# Patient Record
Sex: Female | Born: 1950 | Race: Black or African American | Hispanic: No | Smoking: Former smoker
Health system: Southern US, Community
[De-identification: ages and names within clinical notes are randomized; demographics above are authoritative.]

## PROBLEM LIST (undated history)

## (undated) DIAGNOSIS — D649 Anemia, unspecified: Secondary | ICD-10-CM

## (undated) DIAGNOSIS — I480 Paroxysmal atrial fibrillation: Secondary | ICD-10-CM

## (undated) DIAGNOSIS — Z87442 Personal history of urinary calculi: Secondary | ICD-10-CM

## (undated) DIAGNOSIS — I10 Essential (primary) hypertension: Secondary | ICD-10-CM

## (undated) DIAGNOSIS — R0600 Dyspnea, unspecified: Secondary | ICD-10-CM

## (undated) DIAGNOSIS — E039 Hypothyroidism, unspecified: Secondary | ICD-10-CM

## (undated) DIAGNOSIS — D6869 Other thrombophilia: Secondary | ICD-10-CM

## (undated) DIAGNOSIS — E038 Other specified hypothyroidism: Secondary | ICD-10-CM

## (undated) DIAGNOSIS — K219 Gastro-esophageal reflux disease without esophagitis: Secondary | ICD-10-CM

## (undated) DIAGNOSIS — K760 Fatty (change of) liver, not elsewhere classified: Secondary | ICD-10-CM

## (undated) DIAGNOSIS — I499 Cardiac arrhythmia, unspecified: Secondary | ICD-10-CM

## (undated) DIAGNOSIS — I4892 Unspecified atrial flutter: Secondary | ICD-10-CM

## (undated) DIAGNOSIS — Z720 Tobacco use: Secondary | ICD-10-CM

## (undated) DIAGNOSIS — I2510 Atherosclerotic heart disease of native coronary artery without angina pectoris: Secondary | ICD-10-CM

## (undated) DIAGNOSIS — I483 Typical atrial flutter: Secondary | ICD-10-CM

## (undated) DIAGNOSIS — I447 Left bundle-branch block, unspecified: Secondary | ICD-10-CM

## (undated) DIAGNOSIS — M1990 Unspecified osteoarthritis, unspecified site: Secondary | ICD-10-CM

## (undated) DIAGNOSIS — J189 Pneumonia, unspecified organism: Secondary | ICD-10-CM

## (undated) HISTORY — DX: Hypothyroidism, unspecified: E03.9

## (undated) HISTORY — DX: Cardiac arrhythmia, unspecified: I49.9

## (undated) HISTORY — DX: Unspecified atrial flutter: I48.92

## (undated) HISTORY — DX: Gastro-esophageal reflux disease without esophagitis: K21.9

## (undated) HISTORY — PX: ABDOMINAL HYSTERECTOMY: SHX81

## (undated) HISTORY — DX: Other specified hypothyroidism: E03.8

---

## 2003-09-29 ENCOUNTER — Other Ambulatory Visit: Payer: Self-pay

## 2008-06-24 ENCOUNTER — Emergency Department: Payer: Self-pay | Admitting: Emergency Medicine

## 2008-07-01 ENCOUNTER — Emergency Department: Payer: Self-pay | Admitting: Emergency Medicine

## 2010-05-01 ENCOUNTER — Emergency Department: Payer: Self-pay | Admitting: Emergency Medicine

## 2011-02-17 ENCOUNTER — Emergency Department: Payer: Self-pay | Admitting: Emergency Medicine

## 2011-02-23 ENCOUNTER — Emergency Department: Payer: Self-pay | Admitting: Emergency Medicine

## 2011-07-14 ENCOUNTER — Emergency Department: Payer: Self-pay | Admitting: *Deleted

## 2011-07-14 LAB — CBC WITH DIFFERENTIAL/PLATELET
Basophil #: 0 10*3/uL (ref 0.0–0.1)
Eosinophil %: 0.9 %
HGB: 13.5 g/dL (ref 12.0–16.0)
Lymphocyte %: 14.6 %
MCH: 23.6 pg — ABNORMAL LOW (ref 26.0–34.0)
MCHC: 31.5 g/dL — ABNORMAL LOW (ref 32.0–36.0)
Monocyte #: 0.5 x10 3/mm (ref 0.2–0.9)
Monocyte %: 6.6 %
Neutrophil %: 77.5 %
Platelet: 185 10*3/uL (ref 150–440)
RDW: 14.8 % — ABNORMAL HIGH (ref 11.5–14.5)

## 2011-07-14 LAB — COMPREHENSIVE METABOLIC PANEL
Albumin: 4.3 g/dL (ref 3.4–5.0)
Alkaline Phosphatase: 63 U/L (ref 50–136)
Anion Gap: 5 — ABNORMAL LOW (ref 7–16)
Bilirubin,Total: 0.5 mg/dL (ref 0.2–1.0)
Calcium, Total: 9 mg/dL (ref 8.5–10.1)
Chloride: 108 mmol/L — ABNORMAL HIGH (ref 98–107)
Co2: 29 mmol/L (ref 21–32)
Creatinine: 0.65 mg/dL (ref 0.60–1.30)
Osmolality: 283 (ref 275–301)
SGOT(AST): 18 U/L (ref 15–37)

## 2014-01-06 ENCOUNTER — Emergency Department: Payer: Self-pay | Admitting: Emergency Medicine

## 2014-01-06 LAB — DRUG SCREEN, URINE
Amphetamines, Ur Screen: NEGATIVE (ref ?–1000)
BENZODIAZEPINE, UR SCRN: NEGATIVE (ref ?–200)
Barbiturates, Ur Screen: NEGATIVE (ref ?–200)
COCAINE METABOLITE, UR ~~LOC~~: NEGATIVE (ref ?–300)
Cannabinoid 50 Ng, Ur ~~LOC~~: NEGATIVE (ref ?–50)
MDMA (Ecstasy)Ur Screen: NEGATIVE (ref ?–500)
METHADONE, UR SCREEN: NEGATIVE (ref ?–300)
OPIATE, UR SCREEN: NEGATIVE (ref ?–300)
Phencyclidine (PCP) Ur S: NEGATIVE (ref ?–25)
Tricyclic, Ur Screen: NEGATIVE (ref ?–1000)

## 2014-01-06 LAB — BASIC METABOLIC PANEL
Anion Gap: 7 (ref 7–16)
BUN: 10 mg/dL (ref 7–18)
CHLORIDE: 108 mmol/L — AB (ref 98–107)
CO2: 27 mmol/L (ref 21–32)
Calcium, Total: 8.5 mg/dL (ref 8.5–10.1)
Creatinine: 0.64 mg/dL (ref 0.60–1.30)
EGFR (African American): >60
Glucose: 96 mg/dL (ref 65–99)
Osmolality: 282 (ref 275–301)
POTASSIUM: 3.9 mmol/L (ref 3.5–5.1)
Sodium: 142 mmol/L (ref 136–145)

## 2014-01-06 LAB — CBC
HCT: 44.2 % (ref 35.0–47.0)
HGB: 13.8 g/dL (ref 12.0–16.0)
MCH: 23.6 pg — ABNORMAL LOW (ref 26.0–34.0)
MCHC: 31.3 g/dL — ABNORMAL LOW (ref 32.0–36.0)
MCV: 75 fL — ABNORMAL LOW (ref 80–100)
PLATELETS: 171 10*3/uL (ref 150–440)
RBC: 5.86 10*6/uL — ABNORMAL HIGH (ref 3.80–5.20)
RDW: 15.1 % — AB (ref 11.5–14.5)
WBC: 8.4 10*3/uL (ref 3.6–11.0)

## 2014-01-06 LAB — ETHANOL: Ethanol: <3 mg/dL

## 2014-01-06 LAB — TROPONIN I: Troponin-I: <0.02 ng/mL

## 2015-08-16 ENCOUNTER — Emergency Department
Admission: EM | Admit: 2015-08-16 | Discharge: 2015-08-16 | Disposition: A | Discharge status: Home / Self Care | Payer: Medicaid Other | Attending: Emergency Medicine | Admitting: Emergency Medicine

## 2015-08-16 ENCOUNTER — Encounter: Payer: Self-pay | Admitting: *Deleted

## 2015-08-16 DIAGNOSIS — H9202 Otalgia, left ear: Secondary | ICD-10-CM | POA: Diagnosis present

## 2015-08-16 DIAGNOSIS — I10 Essential (primary) hypertension: Secondary | ICD-10-CM | POA: Insufficient documentation

## 2015-08-16 DIAGNOSIS — F17200 Nicotine dependence, unspecified, uncomplicated: Secondary | ICD-10-CM | POA: Insufficient documentation

## 2015-08-16 DIAGNOSIS — K029 Dental caries, unspecified: Secondary | ICD-10-CM | POA: Insufficient documentation

## 2015-08-16 MED ORDER — AMOXICILLIN 500 MG PO CAPS
500.0000 mg | ORAL_CAPSULE | Freq: Once | ORAL | Status: AC
  Administered 2015-08-16: 500 mg via ORAL
  Filled 2015-08-16: qty 1

## 2015-08-16 MED ORDER — OXYCODONE-ACETAMINOPHEN 5-325 MG PO TABS: 1.0000 | ORAL_TABLET | Freq: Once | ORAL | Status: DC

## 2015-08-16 MED ORDER — AMOXICILLIN 500 MG PO CAPS: 500.0000 mg | ORAL_CAPSULE | Freq: Two times a day (BID) | ORAL | Status: AC

## 2015-08-16 MED ORDER — OXYCODONE-ACETAMINOPHEN 5-325 MG PO TABS
1.0000 | ORAL_TABLET | Freq: Once | ORAL | Status: AC
  Administered 2015-08-16: 1 via ORAL
  Filled 2015-08-16: qty 1

## 2015-08-16 NOTE — ED Notes (Signed)
Pt states sudden onset of L ear pain x 40 mins prior to arrival. Pt states she lay on her L ear and began to have pain. Pt took Aleve prior to arrival and is in no acute distress. Pt is hypertensive in triage, no complaints r/t HTN. Pt states no longer taking her lisinopril for HTN. Counseled pt to return to PCP and restart meds for HTN.

## 2015-08-16 NOTE — Discharge Instructions (Signed)

## 2015-08-16 NOTE — ED Provider Notes (Signed)
[facility] Emergency Department Provider Note  ____________________________________________  Time seen: 5:40 AM  I have reviewed the triage vital signs and the nursing notes.   HISTORY  Chief Complaint Otalgia     HPI [name] is a 65 y.o. female presents with left earache with onset yesterday recurrence today 40 minutes before presentation to emergency department. Patient admits to multiple dental caries with discomfort.    Past Medical History  Diagnosis Date  . Hypertension     There are no active problems to display for this patient.   Past Surgical History  Procedure Laterality Date  . Abdominal hysterectomy      Current Outpatient Rx  Name  Route  Sig  Dispense  Refill  . amoxicillin (AMOXIL) 500 MG capsule   Oral   Take 1 capsule (500 mg total) by mouth 2 (two) times daily.   20 capsule   0   . oxyCODONE-acetaminophen (PERCOCET/ROXICET) 5-325 MG tablet   Oral   Take 1 tablet by mouth once.   20 tablet   0     Allergies No known drug allergies History reviewed. No pertinent family history.  Social History Social History  Substance Use Topics  . Smoking status: Current Some Day Smoker  . Smokeless tobacco: Never Used  . Alcohol Use: Yes     Comment: occasionally    Review of Systems  Constitutional: Negative for fever. Eyes: Negative for visual changes. ENT: Negative for sore throat.Positive for left ear pain/multiple dental caries with pain Cardiovascular: Negative for chest pain. Respiratory: Negative for shortness of breath. Gastrointestinal: Negative for abdominal pain, vomiting and diarrhea. Genitourinary: Negative for dysuria. Musculoskeletal: Negative for back pain. Skin: Negative for rash. Neurological: Negative for headaches, focal weakness or numbness.   10-point ROS otherwise negative.  ____________________________________________   PHYSICAL EXAM:  VITAL SIGNS: ED Triage Vitals  Enc  Vitals Group     BP 08/16/15 0316 215/87 mmHg     Pulse Rate 08/16/15 0316 50     Resp 08/16/15 0316 18     Temp 08/16/15 0316 98 F (36.7 C)     Temp Source 08/16/15 0316 Oral     SpO2 08/16/15 0316 100 %     Weight 08/16/15 0316 155 lb (70.308 kg)     Height 08/16/15 0316  (1.651 m)     Head Cir --      Peak Flow --      Pain Score 08/16/15 0319 4     Pain Loc --      Pain Edu? --      Excl. in GC? --      Constitutional: Alert and oriented. Well appearing and in no distress. Eyes: Conjunctivae are normal. PERRL. Normal extraocular movements. ENT   Head: Normocephalic and atraumatic.   Nose: No congestion/rhinnorhea.   Mouth/Throat: Mucous membranes are moist.Multiple dental caries    Neck: No stridor. Hematological/Lymphatic/Immunilogical: Left anterior cervical lymphadenopathy. Cardiovascular: Normal rate, regular rhythm. Normal and symmetric distal pulses are present in all extremities. No murmurs, rubs, or gallops. Respiratory: Normal respiratory effort without tachypnea nor retractions. Breath sounds are clear and equal bilaterally. No wheezes/rales/rhonchi.. Genitourinary: deferred Musculoskeletal: Nontender with normal range of motion in all extremities. No joint effusions.  No lower extremity tenderness nor edema. Neurologic:  Normal speech and language. No gross focal neurologic deficits are appreciated. Speech is normal.  Skin:  Skin is warm, dry and intact. No rash noted.      INITIAL  IMPRESSION / ASSESSMENT AND PLAN / ED COURSE  Pertinent labs & imaging results that were available during my care of the patient were reviewed by me and considered in my medical decision making (see chart for details).  Patient given amoxicillin and Percocet emergency department will be prescribed same for home. Patient referred to see dentist  ____________________________________________   FINAL CLINICAL IMPRESSION(S) / ED DIAGNOSES  Final diagnoses:   Dental caries      Darci Currentandolph N Brown, MD 08/16/15 [phone]

## 2015-08-16 NOTE — ED Notes (Signed)
Pt and spouse provided with blankets for comfort.

## 2015-09-13 DIAGNOSIS — I4892 Unspecified atrial flutter: Secondary | ICD-10-CM

## 2015-09-13 HISTORY — DX: Unspecified atrial flutter: I48.92

## 2015-09-23 ENCOUNTER — Inpatient Hospital Stay (HOSPITAL_COMMUNITY)
Admit: 2015-09-23 | Discharge: 2015-09-23 | Disposition: A | Discharge status: Home / Self Care | Payer: Medicaid Other | Attending: Family Medicine | Admitting: Family Medicine

## 2015-09-23 ENCOUNTER — Inpatient Hospital Stay
Admission: EM | Admit: 2015-09-23 | Discharge: 2015-09-25 | DRG: 310 | Disposition: A | Discharge status: Home / Self Care | Payer: Medicaid Other | Attending: Internal Medicine | Admitting: Internal Medicine

## 2015-09-23 ENCOUNTER — Emergency Department: Payer: Medicaid Other

## 2015-09-23 DIAGNOSIS — I4892 Unspecified atrial flutter: Principal | ICD-10-CM

## 2015-09-23 DIAGNOSIS — I10 Essential (primary) hypertension: Secondary | ICD-10-CM | POA: Diagnosis present

## 2015-09-23 DIAGNOSIS — E876 Hypokalemia: Secondary | ICD-10-CM | POA: Diagnosis present

## 2015-09-23 DIAGNOSIS — Z79891 Long term (current) use of opiate analgesic: Secondary | ICD-10-CM

## 2015-09-23 DIAGNOSIS — Z720 Tobacco use: Secondary | ICD-10-CM

## 2015-09-23 DIAGNOSIS — F17200 Nicotine dependence, unspecified, uncomplicated: Secondary | ICD-10-CM

## 2015-09-23 DIAGNOSIS — R079 Chest pain, unspecified: Secondary | ICD-10-CM

## 2015-09-23 DIAGNOSIS — I471 Supraventricular tachycardia: Secondary | ICD-10-CM | POA: Diagnosis present

## 2015-09-23 DIAGNOSIS — Z880 Allergy status to penicillin: Secondary | ICD-10-CM | POA: Diagnosis not present

## 2015-09-23 DIAGNOSIS — E039 Hypothyroidism, unspecified: Secondary | ICD-10-CM | POA: Diagnosis present

## 2015-09-23 DIAGNOSIS — I4891 Unspecified atrial fibrillation: Secondary | ICD-10-CM | POA: Diagnosis present

## 2015-09-23 DIAGNOSIS — I483 Typical atrial flutter: Secondary | ICD-10-CM | POA: Diagnosis present

## 2015-09-23 DIAGNOSIS — I498 Other specified cardiac arrhythmias: Secondary | ICD-10-CM

## 2015-09-23 HISTORY — DX: Tobacco use: Z72.0

## 2015-09-23 HISTORY — DX: Essential (primary) hypertension: I10

## 2015-09-23 LAB — URINALYSIS COMPLETE WITH MICROSCOPIC (ARMC ONLY)
BACTERIA UA: NONE SEEN
Bilirubin Urine: NEGATIVE
Glucose, UA: NEGATIVE mg/dL
HGB URINE DIPSTICK: NEGATIVE
Ketones, ur: NEGATIVE mg/dL
NITRITE: NEGATIVE
PH: 7 (ref 5.0–8.0)
Protein, ur: NEGATIVE mg/dL
Specific Gravity, Urine: 1.003 — ABNORMAL LOW (ref 1.005–1.030)

## 2015-09-23 LAB — BASIC METABOLIC PANEL
ANION GAP: 8 (ref 5–15)
BUN: 20 mg/dL (ref 6–20)
CALCIUM: 9.6 mg/dL (ref 8.9–10.3)
CO2: 27 mmol/L (ref 22–32)
CREATININE: 0.87 mg/dL (ref 0.44–1.00)
Chloride: 105 mmol/L (ref 101–111)
Glucose, Bld: 137 mg/dL — ABNORMAL HIGH (ref 65–99)
Potassium: 3.3 mmol/L — ABNORMAL LOW (ref 3.5–5.1)
Sodium: 140 mmol/L (ref 135–145)

## 2015-09-23 LAB — CBC
HCT: 43.3 % (ref 35.0–47.0)
HEMOGLOBIN: 14.6 g/dL (ref 12.0–16.0)
MCH: 24.8 pg — AB (ref 26.0–34.0)
MCHC: 33.7 g/dL (ref 32.0–36.0)
MCV: 73.4 fL — AB (ref 80.0–100.0)
PLATELETS: 174 10*3/uL (ref 150–440)
RBC: 5.89 MIL/uL — AB (ref 3.80–5.20)
RDW: 14.7 % — ABNORMAL HIGH (ref 11.5–14.5)
WBC: 7 10*3/uL (ref 3.6–11.0)

## 2015-09-23 LAB — TROPONIN I
TROPONIN I: 0.04 ng/mL — AB (ref <0.03)
Troponin I: <0.03 ng/mL (ref <0.03)
Troponin I: 0.04 ng/mL (ref <0.03)

## 2015-09-23 LAB — T4, FREE: Free T4: 0.7 ng/dL (ref 0.61–1.12)

## 2015-09-23 LAB — HEPARIN LEVEL (UNFRACTIONATED): HEPARIN UNFRACTIONATED: 0.79 [IU]/mL — AB (ref 0.30–0.70)

## 2015-09-23 LAB — TSH: TSH: 21.96 u[IU]/mL — ABNORMAL HIGH (ref 0.350–4.500)

## 2015-09-23 LAB — MAGNESIUM: Magnesium: 2 mg/dL (ref 1.7–2.4)

## 2015-09-23 LAB — APTT: aPTT: 31 seconds (ref 24–36)

## 2015-09-23 MED ORDER — DILTIAZEM HCL 25 MG/5ML IV SOLN
10.0000 mg | Freq: Once | INTRAVENOUS | Status: AC
  Administered 2015-09-23: 10 mg via INTRAVENOUS
  Filled 2015-09-23: qty 5

## 2015-09-23 MED ORDER — ONDANSETRON HCL 4 MG/2ML IJ SOLN: 4.0000 mg | Freq: Four times a day (QID) | INTRAMUSCULAR | Status: DC | PRN

## 2015-09-23 MED ORDER — SODIUM CHLORIDE 0.9% FLUSH
3.0000 mL | Freq: Two times a day (BID) | INTRAVENOUS | Status: DC
  Administered 2015-09-23: 3 mL via INTRAVENOUS

## 2015-09-23 MED ORDER — POTASSIUM CHLORIDE CRYS ER 20 MEQ PO TBCR
30.0000 meq | EXTENDED_RELEASE_TABLET | Freq: Two times a day (BID) | ORAL | Status: AC
  Administered 2015-09-23 – 2015-09-24 (×2): 30 meq via ORAL
  Filled 2015-09-23 (×2): qty 1

## 2015-09-23 MED ORDER — AMLODIPINE BESYLATE 5 MG PO TABS
5.0000 mg | ORAL_TABLET | Freq: Every day | ORAL | Status: DC
  Administered 2015-09-23 – 2015-09-24 (×2): 5 mg via ORAL
  Filled 2015-09-23 (×2): qty 1

## 2015-09-23 MED ORDER — METOPROLOL TARTRATE 50 MG PO TABS
50.0000 mg | ORAL_TABLET | Freq: Two times a day (BID) | ORAL | Status: DC
  Administered 2015-09-23 – 2015-09-25 (×5): 50 mg via ORAL
  Filled 2015-09-23 (×5): qty 1

## 2015-09-23 MED ORDER — NICOTINE 14 MG/24HR TD PT24
14.0000 mg | MEDICATED_PATCH | Freq: Every day | TRANSDERMAL | Status: DC
  Filled 2015-09-23 (×2): qty 1

## 2015-09-23 MED ORDER — ACETAMINOPHEN 325 MG PO TABS
650.0000 mg | ORAL_TABLET | Freq: Four times a day (QID) | ORAL | Status: DC | PRN
  Administered 2015-09-24 – 2015-09-25 (×2): 650 mg via ORAL
  Filled 2015-09-23 (×2): qty 2

## 2015-09-23 MED ORDER — NITROGLYCERIN 2 % TD OINT: 1.0000 [in_us] | TOPICAL_OINTMENT | Freq: Once | TRANSDERMAL | Status: DC

## 2015-09-23 MED ORDER — DILTIAZEM HCL 30 MG PO TABS
60.0000 mg | ORAL_TABLET | Freq: Once | ORAL | Status: AC
  Administered 2015-09-23: 60 mg via ORAL
  Filled 2015-09-23: qty 2

## 2015-09-23 MED ORDER — POTASSIUM CHLORIDE CRYS ER 20 MEQ PO TBCR: 20.0000 meq | EXTENDED_RELEASE_TABLET | Freq: Once | ORAL | Status: DC

## 2015-09-23 MED ORDER — ONDANSETRON HCL 4 MG PO TABS: 4.0000 mg | ORAL_TABLET | Freq: Four times a day (QID) | ORAL | Status: DC | PRN

## 2015-09-23 MED ORDER — SENNOSIDES-DOCUSATE SODIUM 8.6-50 MG PO TABS: 1.0000 | ORAL_TABLET | Freq: Every evening | ORAL | Status: DC | PRN

## 2015-09-23 MED ORDER — METOPROLOL TARTRATE 5 MG/5ML IV SOLN
5.0000 mg | INTRAVENOUS | Status: DC | PRN
Start: 2015-09-23 — End: 2015-09-25

## 2015-09-23 MED ORDER — SODIUM CHLORIDE 0.9 % IV BOLUS (SEPSIS)
1000.0000 mL | Freq: Once | INTRAVENOUS | Status: AC
  Administered 2015-09-23: 1000 mL via INTRAVENOUS

## 2015-09-23 MED ORDER — APIXABAN 5 MG PO TABS
5.0000 mg | ORAL_TABLET | Freq: Two times a day (BID) | ORAL | Status: DC
  Administered 2015-09-23: 5 mg via ORAL
  Filled 2015-09-23: qty 1

## 2015-09-23 MED ORDER — GI COCKTAIL ~~LOC~~
30.0000 mL | Freq: Once | ORAL | Status: DC
  Filled 2015-09-23 (×2): qty 30

## 2015-09-23 MED ORDER — ACETAMINOPHEN 650 MG RE SUPP: 650.0000 mg | Freq: Four times a day (QID) | RECTAL | Status: DC | PRN

## 2015-09-23 MED ORDER — HEPARIN (PORCINE) IN NACL 100-0.45 UNIT/ML-% IJ SOLN
12.0000 [IU]/kg/h | INTRAMUSCULAR | Status: DC
  Administered 2015-09-23 (×2): 12 [IU]/kg/h via INTRAVENOUS
  Filled 2015-09-23 (×2): qty 250

## 2015-09-23 NOTE — ED Provider Notes (Signed)
[facility] Emergency Department Provider Note   ____________________________________________  Time seen: Approximately 3:00 AM  I have reviewed the triage vital signs and the nursing notes.   HISTORY  Chief Complaint Chest Pain    HPI [name] is a 65 y.o. female who comes into the hospital today with some chest pain.The patient laid down for the night and reports that she started having burning in her chest. She reports that she felt the burning was coming up her chest and going into her throat. The patient reports that she got up to use the bathroom and the pain improved but then it came back not as bad as initially. She reports it was not sharp pain. She reports that she could feel it across her chest. She had some jumping of her left breast and her heart was beating fast. The patient got scared so she called 911. The patient reports that she became nauseous and started feeling sweaty. She felt a little lightheaded but no shortness of breath. The pain goes from the right side of her chest all the way across the left. Initially the pain was a 2-3 out of 10 in intensity but she reports it it's a 0 currently. The patient has never had this pain before so she decided to come and get checked out.   Past Medical History  Diagnosis Date  . Hypertension     Patient Active Problem List   Diagnosis Date Noted  . SVT (supraventricular tachycardia) (HCC) 09/23/2015    Past Surgical History  Procedure Laterality Date  . Abdominal hysterectomy      Current Outpatient Rx  Name  Route  Sig  Dispense  Refill  . oxyCODONE-acetaminophen (PERCOCET/ROXICET) 5-325 MG tablet   Oral   Take 1 tablet by mouth once.   20 tablet   0     Allergies Amoxicillin  No family history on file.  Social History Social History  Substance Use Topics  . Smoking status: Current Some Day Smoker  . Smokeless tobacco: Never Used  . Alcohol Use: Yes     Comment:  occasionally    Review of Systems Constitutional: Sweats No fever/chills Eyes: No visual changes. ENT: No sore throat. Cardiovascular: chest pain. Respiratory: Denies shortness of breath. Gastrointestinal:  nausea, no vomiting.  No diarrhea.  No constipation. Genitourinary: Negative for dysuria. Musculoskeletal: Negative for back pain. Skin: Negative for rash. Neurological: Lightheadedness  10-point ROS otherwise negative.  ____________________________________________   PHYSICAL EXAM:  VITAL SIGNS: ED Triage Vitals  Enc Vitals Group     BP 09/23/15   0300 116/95     Pulse 09/23/15   0300 105     Resp 09/23/15   0300 15     Temp 09/23/15   0300 97.7     Temp src --      SpO2 09/23/15   0300 100%     Weight 09/23/15   0300 156lb     Height --      Head Cir --      Peak Flow --      Pain Score --      Pain Loc --      Pain Edu? --      Excl. in GC? --     Constitutional: Alert and oriented. Well appearing and in Mild distress. Eyes: Conjunctivae are normal. PERRL. EOMI. Head: Atraumatic. Nose: No congestion/rhinnorhea. Mouth/Throat: Mucous membranes are moist.  Oropharynx non-erythematous. Cardiovascular: Tachycardia, regular rhythm. Grossly normal  heart sounds.  Good peripheral circulation. Respiratory: Normal respiratory effort.  No retractions. Lungs CTAB. Gastrointestinal: Soft and nontender. No distention. Positive bowel sounds Musculoskeletal: No lower extremity tenderness nor edema.   Neurologic:  Normal speech and language.  Skin:  Skin is warm, dry and intact.  Psychiatric: Mood and affect are normal. .  ____________________________________________   LABS (all labs ordered are listed, but only abnormal results are displayed)  Labs Reviewed  BASIC METABOLIC PANEL - Abnormal; Notable for the following:    Potassium 3.3 (*)    Glucose, Bld 137 (*)    All other components within normal limits  CBC - Abnormal; Notable for the following:    RBC 5.89  (*)    MCV 73.4 (*)    MCH 24.8 (*)    RDW 14.7 (*)    All other components within normal limits  URINALYSIS COMPLETEWITH MICROSCOPIC ([facility] ONLY) - Abnormal; Notable for the following:    Color, Urine COLORLESS (*)    APPearance CLEAR (*)    Specific Gravity, Urine 1.003 (*)    Leukocytes, UA TRACE (*)    Squamous Epithelial / LPF 0-5 (*)    All other components within normal limits  TROPONIN I  MAGNESIUM   ____________________________________________  EKG  ED ECG REPORT I, Rebecka ApleyWebster,  Askari P, the attending physician, personally viewed and interpreted this ECG.   Date: 09/23/2015  EKG Time: 258  Rate: 105  Rhythm: sinus tachycardia  Axis: Normal  Intervals:none  ST&T Change: ST depressions in leads II, III, aVF, V4, V5, V6  ED ECG REPORT #2 I, Rebecka ApleyWebster,  Mincey P, the attending physician, personally viewed and interpreted this ECG.   Date: 09/23/2015  EKG Time: 421  Rate: 110  Rhythm: sinus tachycardia  Axis: normal  Intervals:none  ST&T Change: ST depressions in leads 2, 3, aVF, V4, V5, V6 approximately a millimeter of elevation in V1 and V2  ED ECG REPORT #3 I, Rebecka ApleyWebster,  Oo P, the attending physician, personally viewed and interpreted this ECG.   Date: 09/23/2015  EKG Time: 438  Rate: 191  Rhythm: SVT  Axis: normal  Intervals:none  ST&T Change: Flipped T waves in leads 2, 3, aVF, V4, V5, V6  ED ECG REPORT #4 I, Rebecka ApleyWebster,  Angelos P, the attending physician, personally viewed and interpreted this ECG.   Date: 09/23/2015  EKG Time: 542  Rate: 152  Rhythm: atrial fibrillation at 152  Axis: normal  Intervals:none  ST&T Change: ST segment depression in leads 2, 3, aVF, flipped T waves in leads V4, V5, V6 and some ST segment elevation in aVR does not meet STEMI criteria  ____________________________________________  RADIOLOGY  CXR: Chest x-ray: No active cardiopulmonary  disease. ____________________________________________   PROCEDURES  Procedure(s) performed: None  Procedures  Critical Care performed: Yes, see critical care note(s)   CRITICAL CARE Performed by: Lucrezia EuropeWebster,  Buske P   Total critical care time: 45 minutes  Critical care time was exclusive of separately billable procedures and treating other patients.  Critical care was necessary to treat or prevent imminent or life-threatening deterioration.  Critical care was time spent personally by me on the following activities: development of treatment plan with patient and/or surrogate as well as nursing, discussions with consultants, evaluation of patient's response to treatment, examination of patient, obtaining history from patient or surrogate, ordering and performing treatments and interventions, ordering and review of laboratory studies, ordering and review of radiographic studies, pulse oximetry and re-evaluation of patient's condition.  ____________________________________________   INITIAL IMPRESSION / ASSESSMENT AND PLAN / ED COURSE  Pertinent labs & imaging results that were available during my care of the patient were reviewed by me and considered in my medical decision making (see chart for details).  This is a 65 year old female who comes into the hospital today with some chest pain. She reports it was burning into her chest and she was having some sweats and some nausea. The patient's heart was also beating very fast. I will give the patient liter of normal saline as well as a GI cocktail. She does have some ST depressions on her EKG. I will check some blood work and reassess the patient.  The patient while she was waiting did develop chest pain again. When the pain started the patient's heart rate went up to 209. We attempted to capture it on EKG but was unable to when the patient's tachycardia improved. It occurred subsequent time with the patient's heart rate at 109. It  appeared as though the patient was in SVT but she was able to break with vagal maneuvers. The patient had 2 more episodes of tachycardia and worsening chest pain. At that time it was decided to give the patient some diltiazem 60 mg orally as well as 10 mg IV. Given the patient's paroxysmal tachycardia which has been SVT as well as atrial fibrillation she will be admitted to the hospitalist service. The patient's chest pain is resolved at this time. ____________________________________________   FINAL CLINICAL IMPRESSION(S) / ED DIAGNOSES  Final diagnoses:  Other cardiac arrhythmia  Chest pain, unspecified chest pain type      NEW MEDICATIONS STARTED DURING THIS VISIT:  New Prescriptions   No medications on file     Note:  This document was prepared using Dragon voice recognition software and may include unintentional dictation errors.    Rebecka Apley, MD 09/23/15 [phone]

## 2015-09-23 NOTE — ED Notes (Addendum)
Pt arrives to ED from home via [facility] for c/o epigastric chest pain/burning with radiation into bilateral sides of neck with nausea, dizziness, and lightheadedness. Pt reports the pain as "burning" and "feeling like my heart was beating hard and fast". Pt denies any previous history of cardiac issues; denies history of gastric reflux.

## 2015-09-23 NOTE — Consult Note (Signed)
Cardiology Consultation Note  Patient ID: [name], MRN: [id number], DOB/AGE: 08-28-1950 65 y.o. Admit date: 09/23/2015   Date of Consult: 09/23/2015 Primary Physician: No primary care provider on file. Primary Cardiologist: New to [facility] Requesting Physician: Dr. Sherryll Burger, MD  Chief Complaint: GERD Reason for Consult: Afib with RVR/elevated   HPI: 65 y.o. female with h/o HTN, long standing ongoing tobacco abuse, and history of self reported hyperthyroidism not on medication presented to [facility] on 7/11 with heart burn and palpitations. She was found to be in new onset Afib/flutter with RVR with heart rates in the 150's bpm. Cardiology is consulted for further evaluation.  She has never seen a cardiologist before and has no prior echo, stress test, or cardiac cath. She self reports a history of hyperthyroidism in 1996 but has not been on medication. She does not see PCP frequently. She has a long standing tobacco abuse history, smoking 1-2 PPD since her teenage years and continues to smoke to date. She rarely drinks alcohol and denies any illegal drug abuse.   She was in her usual state of health on 7/10 and laid down to sleep at 1:30 AM. Shortly after falling asleep she was woken up with a heart burn sensation. She reports a mild history of heart burn, bu "not like this." She then noticed palpitations. She went to the restroom and reports she could see her left breast moving with her rapid heart beat. She advised her "old man" to call EMS for transport to the hospital. There was associated nausea without emesis, SOB, and diaphoresis. No dizziness, presyncope, or syncope.   Upon the patient's arrival to [facility] they were found to have troponin <0.03-->0.04, TSH 21.960, K+ 3.3, Mg++ 2.0. ECG x 2 as below, CXR showed no active cardiopulmonary disease. She is currently asymptomatic, in rate-controlled Afib/flutter with heart rates in the 70's bpm. She has received one dose of Eliquis this morning per  IM.   Past Medical History  Diagnosis Date  . Essential hypertension   . Tobacco abuse   . Abnormal thyroid blood test       Most Recent Cardiac Studies: none   Surgical History:  Past Surgical History  Procedure Laterality Date  . Abdominal hysterectomy       Home Meds: Prior to Admission medications   Medication Sig Start Date End Date Taking? Authorizing Provider  oxyCODONE-acetaminophen (PERCOCET/ROXICET) 5-325 MG tablet Take 1 tablet by mouth once. Patient taking differently: Take 1 tablet by mouth daily as needed.  08/16/15  Yes Darci Current, MD    Inpatient Medications:  . apixaban  5 mg Oral BID  . metoprolol tartrate  50 mg Oral BID  . nicotine  14 mg Transdermal Daily  . nitroGLYCERIN  1 inch Topical Once  . potassium chloride  30 mEq Oral BID  . sodium chloride flush  3 mL Intravenous Q12H      Allergies:  Allergies  Allergen Reactions  . Amoxicillin Itching    Social History   Social History  . Marital Status: Widowed    Spouse Name: N/A  . Number of Children: N/A  . Years of Education: N/A   Occupational History  . Not on file.   Social History Main Topics  . Smoking status: Current Some Day Smoker  . Smokeless tobacco: Never Used  . Alcohol Use: Yes     Comment: occasionally  . Drug Use: No  . Sexual Activity: Yes    Birth Control/ Protection: Surgical  Other Topics Concern  . Not on file   Social History Narrative     Family History  Problem Relation Age of Onset  . Hypertension Mother   . Diabetes Mother   . Diabetes Father      Review of Systems: Review of Systems  Constitutional: Positive for weight loss and malaise/fatigue. Negative for fever, chills and diaphoresis.  HENT: Negative for congestion.   Eyes: Negative for discharge and redness.  Respiratory: Positive for shortness of breath. Negative for cough, sputum production and wheezing.   Cardiovascular: Positive for palpitations. Negative for chest pain,  orthopnea, claudication, leg swelling and PND.  Gastrointestinal: Positive for heartburn and nausea. Negative for vomiting, abdominal pain, blood in stool and melena.  Genitourinary: Negative for hematuria.  Musculoskeletal: Negative for falls.  Skin: Negative for rash.  Neurological: Positive for weakness. Negative for dizziness, sensory change, speech change, focal weakness and loss of consciousness.  Endo/Heme/Allergies: Does not bruise/bleed easily.  Psychiatric/Behavioral: The patient is not nervous/anxious.   All other systems reviewed and are negative.   Labs:  Recent Labs  09/23/15 0313 09/23/15 1012  TROPONINI <0.03 0.04*   Lab Results  Component Value Date   WBC 7.0 09/23/2015   HGB 14.6 09/23/2015   HCT 43.3 09/23/2015   MCV 73.4* 09/23/2015   PLT 174 09/23/2015     Recent Labs Lab 09/23/15 0313  NA 140  K 3.3*  CL 105  CO2 27  BUN 20  CREATININE 0.87  CALCIUM 9.6  GLUCOSE 137*   No results found for: CHOL, HDL, LDLCALC, TRIG No results found for: DDIMER  Radiology/Studies:  Dg Chest 2 View  09/23/2015  CLINICAL DATA:  Epigastric chest pain and burning with radiation to both sides of the neck. Nausea, dizziness, lightheadedness. Smoker. EXAM: CHEST  2 VIEW COMPARISON:  01/06/2014 FINDINGS: Mild hyperinflation consistent with emphysema. Normal heart size and pulmonary vascularity. No focal airspace disease or consolidation in the lungs. No blunting of costophrenic angles. No pneumothorax. Mediastinal contours appear intact. Calcified and tortuous aorta. Degenerative changes in the spine. IMPRESSION: No active cardiopulmonary disease. Electronically Signed   By: Burman NievesWilliam  Stevens M.D.   On: 09/23/2015 03:38    EKG: Interpreted by me showed: 7/11 at 5:42 AM - Afib with RVR, 152 bpm, LVH, diffuse TWI with horiazontal st depression in V4-V5. 7/11 at 2:58 AM - Afib with RVR, 105 bpm, diffuse TWI Telemetry: Interpreted by me showed: Afib/flutter, 70's  bpm  Weights: Filed Weights   09/23/15 0300 09/23/15 0754  Weight: 156 lb (70.761 kg) 154 lb 14.4 oz (70.262 kg)     Physical Exam: Blood pressure 145/85, pulse 54, temperature 97.6 F (36.4 C), temperature source Oral, resp. rate 22, height 5\' 5"  (1.651 m), weight 154 lb 14.4 oz (70.262 kg), SpO2 100 %. Body mass index is 25.78 kg/(m^2). General: Well developed, well nourished, in no acute distress. Head: Normocephalic, atraumatic, sclera non-icteric, no xanthomas, nares are without discharge. Poor dentition.   Neck: Negative for carotid bruits. JVD not elevated. Lungs: Clear bilaterally to auscultation without wheezes, rales, or rhonchi. Breathing is unlabored. Heart: Irregularly-irregular with S1 S2. No murmurs, rubs, or gallops appreciated. Abdomen: Soft, non-tender, non-distended with normoactive bowel sounds. No hepatomegaly. No rebound/guarding. No obvious abdominal masses. Msk:  Strength and tone appear normal for age. Extremities: No clubbing or cyanosis. No edema. Distal pedal pulses are 2+ and equal bilaterally. Neuro: Alert and oriented X 3. No facial asymmetry. No focal deficit. Moves all extremities spontaneously. Psych:  Responds to questions appropriately with a normal affect.    Assessment and Plan:  Principal Problem:   Atrial fibrillation with RVR (HCC) Active Problems:   Abnormal thyroid blood test   Hypokalemia   Essential hypertension   Tobacco abuse    1. New onset Afib/flutter with RVR: -Symptoms began shortly after 1:30 AM -Very symptomatic with tachycardic heart rate, though not so with rate control -Cannot guarantee she has not been in this rhythm for longer than overnight given her asymptomatic nature with adequate rate control -Her arrhythmia is likely in the setting of her severely abnormal thyroid function study with a TSH > 21 and her hypokalemia at 3.3 upon admission -Pursue rate control at this time with metoprolol -She was started on Eliquis  per IM this morning. There is concern for possible ischemia in the setting of her elevated troponin at 0.04 and abnormal st/t changes on EKG. Those these findings may also be in the setting of her tachycardic heart rate -Stop Eliquis and place on heparin gtt for possible cardiac cath this admission given the below -CHADS2VASc at least 2 (HTN, sex category) -Check echo once heart rate is controlled (this has been done already, uncertain at this time if completely with rate-controlled Afib)  2. Elevated troponin: -<0.03-->0.04 -Continue to cycle as above -Possibly in the setting of her tachycardic heart rate vs ACS given her symptoms of a different type of reflux than she has had before -Start heparin gtt in place of Eliquis as above -On metoprolol as above  3. Severely abnormal thyroid function: -Patient self reports a history of hyperthyroidism -I have added on thyroid function studies to evaluate for hypo vs hyperthyroidism -Defer treatment of this abnormality to IM  4. Hypokalemia: -Added KCl 30 meq bid x 2 doses -Recheck bmet in the morning  -Replete to 4.0  5. Essential hypertension: -On metoprolol as above -Add Norvasc 5 mg daily (if needed at a later date can change to diltiazem for added rate control, though this is not currently needed) -Monitor   6. Longstanding tobacco abuse: -Cessation advised    Signed, Eula Listen, PA-C [facility] Pager: [phone] 09/23/2015, 4:01 PM

## 2015-09-23 NOTE — Progress Notes (Signed)
Heparin gtt to start at 2100 per Pharm/ spoke with pharm on via phone/ told to stop gtt until 2100/ gtt stopped/ will communicate with night RN to restart at 2100 as requested.

## 2015-09-23 NOTE — ED Notes (Addendum)
After ambulating to the BR to void, the pt reports the burning sensation in her chest had returned and agreed to take the GI cocktail that Dr Zenda AlpersWebster had originally ordered. After taking one sip of the medication, the pt immediately began to vomit and become tachycardiac with HR reaching 200 BPMs. Pt instructed to attempt vagal maneuver which successfully reduced the pt's HR to the 100-110 BPM range.Zenda AlpersWebster, MD made aware.

## 2015-09-23 NOTE — ED Notes (Signed)
Patient transported to X-ray 

## 2015-09-23 NOTE — Progress Notes (Signed)
[employer] - [place] at [facility]   PATIENT NAME: [name]    MR#:  [id number]  DATE OF BIRTH:  02/01/1951  SUBJECTIVE:  CHIEF COMPLAINT:   Chief Complaint  Patient presents with  . Chest Pain  still feels fluttering spell, some SOB, family at bedside.  REVIEW OF SYSTEMS:  Review of Systems  Constitutional: Negative for fever, weight loss, malaise/fatigue and diaphoresis.  HENT: Negative for ear discharge, ear pain, hearing loss, nosebleeds, sore throat and tinnitus.   Eyes: Negative for blurred vision and pain.  Respiratory: Positive for shortness of breath. Negative for cough, hemoptysis and wheezing.   Cardiovascular: Positive for palpitations. Negative for chest pain, orthopnea and leg swelling.  Gastrointestinal: Negative for heartburn, nausea, vomiting, abdominal pain, diarrhea, constipation and blood in stool.  Genitourinary: Negative for dysuria, urgency and frequency.  Musculoskeletal: Negative for myalgias and back pain.  Skin: Negative for itching and rash.  Neurological: Negative for dizziness, tingling, tremors, focal weakness, seizures, weakness and headaches.  Psychiatric/Behavioral: Negative for depression. The patient is not nervous/anxious.    DRUG ALLERGIES:   Allergies  Allergen Reactions  . Amoxicillin Itching   VITALS:  Blood pressure 145/85, pulse 54, temperature 97.6 F (36.4 C), temperature source Oral, resp. rate 22, height 5\' 5"  (1.651 m), weight 70.262 kg (154 lb 14.4 oz), SpO2 100 %. PHYSICAL EXAMINATION:  Physical Exam  Constitutional: She is oriented to person, place, and time and well-developed, well-nourished, and in no distress.  HENT:  Head: Normocephalic and atraumatic.  Eyes: Conjunctivae and EOM are normal. Pupils are equal, round, and reactive to light.  Neck: Normal range of motion. Neck supple. No tracheal deviation present. No thyromegaly present.  Cardiovascular: Normal rate and normal heart sounds.  An  irregularly irregular rhythm present.  Pulmonary/Chest: Effort normal and breath sounds normal. No respiratory distress. She has no wheezes. She exhibits no tenderness.  Abdominal: Soft. Bowel sounds are normal. She exhibits no distension. There is no tenderness.  Musculoskeletal: Normal range of motion.  Neurological: She is alert and oriented to person, place, and time. No cranial nerve deficit.  Skin: Skin is warm and dry. No rash noted.  Psychiatric: Mood and affect normal.   LABORATORY PANEL:   CBC  Recent Labs Lab 09/23/15 0313  WBC 7.0  HGB 14.6  HCT 43.3  PLT 174   ------------------------------------------------------------------------------------------------------------------ Chemistries   Recent Labs Lab 09/23/15 0313  NA 140  K 3.3*  CL 105  CO2 27  GLUCOSE 137*  BUN 20  CREATININE 0.87  CALCIUM 9.6  MG 2.0   RADIOLOGY:  Dg Chest 2 View  09/23/2015  CLINICAL DATA:  Epigastric chest pain and burning with radiation to both sides of the neck. Nausea, dizziness, lightheadedness. Smoker. EXAM: CHEST  2 VIEW COMPARISON:  01/06/2014 FINDINGS: Mild hyperinflation consistent with emphysema. Normal heart size and pulmonary vascularity. No focal airspace disease or consolidation in the lungs. No blunting of costophrenic angles. No pneumothorax. Mediastinal contours appear intact. Calcified and tortuous aorta. Degenerative changes in the spine. IMPRESSION: No active cardiopulmonary disease. Electronically Signed   By: Burman NievesWilliam  Stevens M.D.   On: 09/23/2015 03:38   ASSESSMENT AND PLAN:  . SVT (supraventricular tachycardia) (HCC) /atrial fib fibrillation - Monitor on telemetry -TSH high, check Free T3,T4, pending 2-D echo - HR under control by mouth metoprolol 50 mg by mouth twice a day, also IV Lopressor as needed for heart or greater than 110 -Eliquis pharmacy to dose. The patient heart  rate goes between A. fib, SVT and sinus tach. -cardio c/s  Hypertension -  metoprolol  Hypokalemia -Replete and recheck  Tobacco abuse -Nicotine patch     All the records are reviewed and case discussed with Care Management/Social Worker. Management plans discussed with the patient, family and they are in agreement.  CODE STATUS: FULL CODE  TOTAL TIME TAKING CARE OF THIS PATIENT: 35 minutes.   More than 50% of the time was spent in counseling/coordination of care: YES  POSSIBLE D/C IN 1-2 DAYS, DEPENDING ON CLINICAL CONDITION. And cardio eval   [facility], Cambrea Kirt M.D on 09/23/2015 at 3:59 PM  Between 7am to 6pm - Pager - [phone]  After 6pm go to [email or web address] - Scientist, research (life sciences) [facility]  Office  [phone]  CC: Primary care physician; No primary care provider on file.  Note: This dictation was prepared with Dragon dictation along with smaller phrase technology. Any transcriptional errors that result from this process are unintentional.

## 2015-09-23 NOTE — ED Notes (Addendum)
Pt assisted to void and became tachycardic again for the 2nd time with reported "burning" sensation in her chest. Pts HR noted to be up to 190 BPM and then return to 100-110 BPM following vagal maneuver. Zenda AlpersWebster, MD made aware.

## 2015-09-23 NOTE — ED Notes (Signed)
Lab called and notified of add-on Magnesium level; lab tech verbalized acknowledgement.

## 2015-09-23 NOTE — Progress Notes (Signed)
*  PRELIMINARY RESULTS* Echocardiogram 2D Echocardiogram has been performed.  Cristela BlueHege, Kyriana Yankee 09/23/2015, 3:10 PM

## 2015-09-23 NOTE — Progress Notes (Signed)
ANTICOAGULATION CONSULT NOTE - Initial Consult  Pharmacy Consult for Heparin Indication: atrial fibrillation  Allergies  Allergen Reactions  . Amoxicillin Itching   Patient Measurements: Height: 5\' 5"  (165.1 cm) Weight: 154 lb 14.4 oz (70.262 kg) IBW/kg (Calculated) : 57  Vital Signs: Temp: 97.6 F (36.4 C) (07/11 1204) Temp Source: Oral (07/11 1204) BP: 145/85 mmHg (07/11 1204) Pulse Rate: 54 (07/11 1204)   Recent Labs  09/23/15 0313 09/23/15 1012  HGB 14.6  --   HCT 43.3  --   PLT 174  --   CREATININE 0.87  --   TROPONINI <0.03 0.04*    Estimated Creatinine Clearance: 65.1 mL/min (by C-G formula based on Cr of 0.87).  Medical History: Past Medical History  Diagnosis Date  . Essential hypertension   . Tobacco abuse   . Abnormal thyroid blood test     Assessment: 65 yo female started on Apixaban for A. Fib. Cardiology consulted pharmacy for transition from Apixaban to heparin due to abnormal EKG and elevated troponin.  Patient received one dose inpatient of Apixaban at 0924 today (7/11).   Goal of Therapy:  Heparin level 0.3-0.7 units/ml Monitor platelets by anticoagulation protocol: Yes APTT: 66-102   Plan:  Apixaban has been D/Cd.  Baseline aPTT and HL ordered. Will start patient on Heparin 850 units/Hr, will not bolus. Drip to start 12 hours after apixaban dose at 2100.  HL and aPTT ordered for 6 hours after drip initiation.   Cher NakaiSheema Chasity Outten, PharmD Clinical Pharmacist 09/23/2015 4:54 PM

## 2015-09-23 NOTE — Progress Notes (Signed)
ANTICOAGULATION CONSULT NOTE - Initial Consult  Pharmacy Consult for apixaban Indication: atrial fibrillation  Allergies  Allergen Reactions  . Amoxicillin Itching    Patient Measurements: Height: 5\' 5"  (165.1 cm) Weight: 156 lb (70.761 kg) IBW/kg (Calculated) : 57  Vital Signs: Temp: 97.6 F (36.4 C) (07/11 0754) Temp Source: Oral (07/11 0754) BP: 126/90 mmHg (07/11 0754) Pulse Rate: 90 (07/11 0754)  Labs:  Recent Labs  09/23/15 0313  HGB 14.6  HCT 43.3  PLT 174  CREATININE 0.87  TROPONINI <0.03    Estimated Creatinine Clearance: 65.3 mL/min (by C-G formula based on Cr of 0.87).   Medical History: Past Medical History  Diagnosis Date  . Hypertension     Assessment: Pharmacy consulted to dose apixaban for atrial fibrillation  Plan:  Will initiate apixaban 5mg  PO BID  Carmella Kees C 09/23/2015,8:26 AM

## 2015-09-23 NOTE — H&P (Signed)
PCP:   No primary care provider on file.   Chief Complaint:  Chest burning and shortness of breath  HPI: [name] who states she's been in good health when last night she woke up and had severe heartburn. She had to go to restroom, when she got there she was extremely short of breath and she had significant heart palpitations. She states she couldn't visually see her heart beating. She began hyperventilating. She denies actual chest pain but she states she had chest burning feeling like severe GERD. That coupled with the shortness of breath and the patient decided to come to the ER. Initially in the ER the patient was tachycardic but sinus rhythm, she got up to go to the restroom and went into SVT. This is occurred a few times with movements, with emesis. Per ER physician [name], she's been sinus tach, she's been in atrial fibrillation. The rhythm breaks spontaneously but frequently recur. She sound been given IV and by mouth Cardizem. The patient remains tachycardic.  Review of Systems:  The patient denies anorexia, fever, weight loss,, vision loss, decreased hearing, hoarseness, chest burning, palpitations, shortness of breath, syncope, dyspnea on exertion, peripheral edema, balance deficits, hemoptysis, abdominal pain, melena, hematochezia, severe indigestion/heartburn, hematuria, incontinence, genital sores, muscle weakness, suspicious skin lesions, transient blindness, difficulty walking, depression, unusual weight change, abnormal bleeding, enlarged lymph nodes, angioedema, and breast masses.  Past Medical History: Past Medical History  Diagnosis Date  . Hypertension    Past Surgical History  Procedure Laterality Date  . Abdominal hysterectomy      Medications: Prior to Admission medications   Medication Sig Start Date End Date Taking? Authorizing Provider  oxyCODONE-acetaminophen (PERCOCET/ROXICET) 5-325 MG tablet Take 1 tablet by mouth once. 08/16/15    Darci Current, MD    Allergies:   Allergies  Allergen Reactions  . Amoxicillin Itching    Social History:  reports that she has been smoking.  She has never used smokeless tobacco. She reports that she drinks alcohol. She reports that she does not use illicit drugs.  Family History: Diabetes mellitus  Physical Exam: Filed Vitals:   09/23/15 0431 09/23/15 0459 09/23/15 0552 09/23/15 0554  BP: 150/113 124/93 147/107 147/107  Pulse: 103 105  104  Temp:      TempSrc:      Resp: Height:      Weight:      SpO2: 100% 100%  99%    General:  Alert and oriented times three, well developed and nourished, no acute distress Eyes: PERRLA, pink conjunctiva, no scleral icterus ENT: Moist oral mucosa, neck supple, no thyromegaly Lungs: clear to ascultation, no wheeze, no crackles, no use of accessory muscles Cardiovascular: irregular rhythm, tachycardic, no regurgitation, no gallops, no murmurs. No carotid bruits, no JVD Abdomen: soft, positive BS, non-tender, non-distended, no organomegaly, not an acute abdomen GU: not examined Neuro: CN II - XII grossly intact, sensation intact Musculoskeletal: strength 5/5 all extremities, no clubbing, cyanosis or edema Skin: no rash, no subcutaneous crepitation, no decubitus Psych: appropriate patient   Labs on Admission:   Recent Labs  09/23/15 0313  NA 140  K 3.3*  CL 105  CO2 27  GLUCOSE 137*  BUN 20  CREATININE 0.87  CALCIUM 9.6  MG 2.0   No results for input(s): AST, ALT, ALKPHOS, BILITOT, PROT, ALBUMIN in the last 72 hours. No results for input(s): LIPASE, AMYLASE in the last 72 hours.  Recent  Labs  09/23/15 0313  WBC 7.0  HGB 14.6  HCT 43.3  MCV 73.4*  PLT 174    Recent Labs  09/23/15 0313  TROPONINI <0.03   Invalid input(s): POCBNP No results for input(s): DDIMER in the last 72 hours. No results for input(s): HGBA1C in the last 72 hours. No results for input(s): CHOL, HDL, LDLCALC, TRIG, CHOLHDL,  LDLDIRECT in the last 72 hours. No results for input(s): TSH, T4TOTAL, T3FREE, THYROIDAB in the last 72 hours.  Invalid input(s): FREET3 No results for input(s): VITAMINB12, FOLATE, FERRITIN, TIBC, IRON, RETICCTPCT in the last 72 hours.  Micro Results: No results found for this or any previous visit (from the past 240 hour(s)).   Radiological Exams on Admission: Dg Chest 2 View  09/23/2015  CLINICAL DATA:  Epigastric chest pain and burning with radiation to both sides of the neck. Nausea, dizziness, lightheadedness. Smoker. EXAM: CHEST  2 VIEW COMPARISON:  01/06/2014 FINDINGS: Mild hyperinflation consistent with emphysema. Normal heart size and pulmonary vascularity. No focal airspace disease or consolidation in the lungs. No blunting of costophrenic angles. No pneumothorax. Mediastinal contours appear intact. Calcified and tortuous aorta. Degenerative changes in the spine. IMPRESSION: No active cardiopulmonary disease. Electronically Signed   By: Burman NievesWilliam  Stevens M.D.   On: 09/23/2015 03:38    Assessment/Plan Present on Admission:  . SVT (supraventricular tachycardia) (HCC) /atrial fib fibrillation -Admit to telemetry -TSH, 2-D echo ordered for the a.m. - IV Lopressor as needed for heart or greater than 110, by mouth metoprolol 50 mg by mouth twice a day first dose now -Eliquis pharmacy to dose. The patient heart rate goes between A. fib, SVT and sinus tach. -DC lisinopril  Hypertension -Will change lisinopril to metoprolol  Hypokalemia -Replete by mouth  Tobacco abuse -Nicotine patch   [name] 09/23/2015, 6:40 AM

## 2015-09-23 NOTE — Progress Notes (Addendum)
Troponin .04 dr Sherryll Burgershah notified

## 2015-09-23 NOTE — ED Notes (Signed)
Assisted the patient to the restroom. 

## 2015-09-24 ENCOUNTER — Inpatient Hospital Stay: Payer: Medicaid Other

## 2015-09-24 DIAGNOSIS — I10 Essential (primary) hypertension: Secondary | ICD-10-CM

## 2015-09-24 DIAGNOSIS — I4892 Unspecified atrial flutter: Principal | ICD-10-CM

## 2015-09-24 DIAGNOSIS — I483 Typical atrial flutter: Secondary | ICD-10-CM | POA: Diagnosis present

## 2015-09-24 LAB — APTT
APTT: 80 s — AB (ref 24–36)
aPTT: 49 seconds — ABNORMAL HIGH (ref 24–36)
aPTT: 61 seconds — ABNORMAL HIGH (ref 24–36)

## 2015-09-24 LAB — CBC
HCT: 45.8 % (ref 35.0–47.0)
HEMOGLOBIN: 14.8 g/dL (ref 12.0–16.0)
MCH: 24 pg — AB (ref 26.0–34.0)
MCHC: 32.3 g/dL (ref 32.0–36.0)
MCV: 74.2 fL — ABNORMAL LOW (ref 80.0–100.0)
Platelets: 166 10*3/uL (ref 150–440)
RBC: 6.17 MIL/uL — AB (ref 3.80–5.20)
RDW: 15.2 % — ABNORMAL HIGH (ref 11.5–14.5)
WBC: 7.2 10*3/uL (ref 3.6–11.0)

## 2015-09-24 LAB — BASIC METABOLIC PANEL
ANION GAP: 7 (ref 5–15)
BUN: 15 mg/dL (ref 6–20)
CHLORIDE: 107 mmol/L (ref 101–111)
CO2: 25 mmol/L (ref 22–32)
Calcium: 9.3 mg/dL (ref 8.9–10.3)
Creatinine, Ser: 0.65 mg/dL (ref 0.44–1.00)
GFR calc non Af Amer: >60 mL/min (ref >60)
Glucose, Bld: 99 mg/dL (ref 65–99)
Potassium: 4.1 mmol/L (ref 3.5–5.1)
Sodium: 139 mmol/L (ref 135–145)

## 2015-09-24 LAB — TROPONIN I: Troponin I: <0.03 ng/mL (ref <0.03)

## 2015-09-24 LAB — HEPARIN LEVEL (UNFRACTIONATED)
HEPARIN UNFRACTIONATED: 0.59 [IU]/mL (ref 0.30–0.70)
HEPARIN UNFRACTIONATED: 0.58 [IU]/mL (ref 0.30–0.70)

## 2015-09-24 LAB — ECHOCARDIOGRAM COMPLETE
Height: 65 in
WEIGHTICAEL: 2478.4 [oz_av]

## 2015-09-24 LAB — T3: T3, Total: 80 ng/dL (ref 71–180)

## 2015-09-24 LAB — T3, FREE: T3, Free: 2.4 pg/mL (ref 2.0–4.4)

## 2015-09-24 MED ORDER — LISINOPRIL 5 MG PO TABS
5.0000 mg | ORAL_TABLET | Freq: Every day | ORAL | Status: DC
  Administered 2015-09-25: 5 mg via ORAL
  Filled 2015-09-24: qty 1

## 2015-09-24 MED ORDER — AMLODIPINE BESYLATE 5 MG PO TABS
5.0000 mg | ORAL_TABLET | Freq: Every day | ORAL | Status: DC
  Administered 2015-09-24 – 2015-09-25 (×2): 5 mg via ORAL
  Filled 2015-09-24 (×2): qty 1

## 2015-09-24 MED ORDER — IOPAMIDOL (ISOVUE-370) INJECTION 76%
100.0000 mL | Freq: Once | INTRAVENOUS | Status: AC | PRN
  Administered 2015-09-24: 100 mL via INTRAVENOUS

## 2015-09-24 MED ORDER — HEPARIN (PORCINE) IN NACL 100-0.45 UNIT/ML-% IJ SOLN
1050.0000 [IU]/h | INTRAMUSCULAR | Status: DC
  Administered 2015-09-24: 950 [IU]/h via INTRAVENOUS
  Filled 2015-09-24 (×2): qty 250

## 2015-09-24 NOTE — Progress Notes (Addendum)
ANTICOAGULATION CONSULT NOTE - Initial Consult  Pharmacy Consult for Heparin Indication: atrial fibrillation  Allergies  Allergen Reactions  . Amoxicillin Itching   Patient Measurements: Height: 5\' 5"  (165.1 cm) Weight: 154 lb 14.4 oz (70.262 kg) IBW/kg (Calculated) : 57  Vital Signs:  Temp: 98.4 F (36.9 C) (07/12 2006) Temp Source: Oral (07/12 2006) BP: 131/89 mmHg (07/12 2006) Pulse Rate: 77 (07/12 2006)   Recent Labs  09/23/15 0313  09/23/15 1610  09/23/15 1759 09/24/15 0254 09/24/15 1116 09/24/15 1700  HGB 14.6  --   --   --   --  14.8  --   --   HCT 43.3  --   --   --   --  45.8  --   --   PLT 174  --   --   --   --  166  --   --   APTT  --   --   --   < > 31 49* 80* 61*  HEPARINUNFRC  --   --   --   --  0.79* 0.59 0.58  --   CREATININE 0.87  --   --   --   --  0.65  --   --   TROPONINI <0.03  < > 0.04*  --   --  <0.03 <0.03  --   < > = values in this interval not displayed.  Estimated Creatinine Clearance: 70.8 mL/min (by C-G formula based on Cr of 0.65).  Medical History: Past Medical History  Diagnosis Date  . Essential hypertension   . Tobacco abuse   . Abnormal thyroid blood test     Assessment: 65 yo female started on Apixaban for A. Fib. Cardiology consulted pharmacy for transition from Apixaban to heparin due to abnormal EKG and elevated troponin.   Patient received one dose inpatient of Apixaban at 0924 (7/11).     Goal of Therapy:  Heparin level 0.3-0.7 units/ml Monitor platelets by anticoagulation protocol: Yes APTT: 66-102   Plan:  Current orders for heparin 950 untis/hr. APTT therapeutic, will recheck confirmatory aPTT in 6 hours. Repeat HL, APTT, CBC with AM labs.   7/12 :  APTT @ 17:00 = 61  Will increase heparin gtt to 1050 units/hr and recheck aPTT on 7/13 @ 0500. Will recheck HL on 7/13 @ 0500.   7/13 03:00 aPTT 95.  Heparin level 0.61. Recheck in 6 hours to confirm.  Robbins,Jason D Clinical Pharmacist  09/24/2015 8:43  PM

## 2015-09-24 NOTE — Progress Notes (Signed)
ANTICOAGULATION CONSULT NOTE - Initial Consult  Pharmacy Consult for Heparin Indication: atrial fibrillation  Allergies  Allergen Reactions  . Amoxicillin Itching   Patient Measurements: Height: 5\' 5"  (165.1 cm) Weight: 154 lb 14.4 oz (70.262 kg) IBW/kg (Calculated) : 57  Vital Signs: Temp: 97.5 F (36.4 C) (07/12 0414) Temp Source: Oral (07/11 2018) BP: 143/98 mmHg (07/12 0414) Pulse Rate: 74 (07/12 0414)   Recent Labs  09/23/15 0313 09/23/15 1012 09/23/15 1610 09/23/15 1759 09/24/15 0254  HGB 14.6  --   --   --  14.8  HCT 43.3  --   --   --  45.8  PLT 174  --   --   --  166  APTT  --   --   --  31 49*  HEPARINUNFRC  --   --   --  0.79* 0.59  CREATININE 0.87  --   --   --  0.65  TROPONINI <0.03 0.04* 0.04*  --   --     Estimated Creatinine Clearance: 70.8 mL/min (by C-G formula based on Cr of 0.65).  Medical History: Past Medical History  Diagnosis Date  . Essential hypertension   . Tobacco abuse   . Abnormal thyroid blood test     Assessment: 65 yo female started on Apixaban for A. Fib. Cardiology consulted pharmacy for transition from Apixaban to heparin due to abnormal EKG and elevated troponin.  Patient received one dose inpatient of Apixaban at 0924 today (7/11).   Goal of Therapy:  Heparin level 0.3-0.7 units/ml Monitor platelets by anticoagulation protocol: Yes APTT: 66-102   Plan:  Apixaban has been D/Cd.  Baseline aPTT and HL ordered. Will start patient on Heparin 850 units/Hr, will not bolus. Drip to start 12 hours after apixaban dose at 2100.  HL and aPTT ordered for 6 hours after drip initiation.   7/12 03:330 heparin level 0.49. aPTT 49. Increased drip to 950 units/hr. Recheck heparin level and aPTT in 6 hours.  Cher NakaiSheema Hallaji, PharmD Clinical Pharmacist 09/24/2015 5:07 AM

## 2015-09-24 NOTE — Progress Notes (Addendum)
[employer] - [place] at [facility]   PATIENT NAME: Pamela LoosenWanda Maddox    MR#:  [id number]  DATE OF BIRTH:  Feb 04, 1951  SUBJECTIVE:  CHIEF COMPLAINT:   Chief Complaint  Patient presents with  . Chest Pain  c/o sharp chest pain (intermittently), HR under better control.  REVIEW OF SYSTEMS:  Review of Systems  Constitutional: Negative for fever, weight loss, malaise/fatigue and diaphoresis.  HENT: Negative for ear discharge, ear pain, hearing loss, nosebleeds, sore throat and tinnitus.   Eyes: Negative for blurred vision and pain.  Respiratory: Positive for shortness of breath. Negative for cough, hemoptysis and wheezing.   Cardiovascular: Positive for palpitations. Negative for chest pain, orthopnea and leg swelling.  Gastrointestinal: Negative for heartburn, nausea, vomiting, abdominal pain, diarrhea, constipation and blood in stool.  Genitourinary: Negative for dysuria, urgency and frequency.  Musculoskeletal: Negative for myalgias and back pain.  Skin: Negative for itching and rash.  Neurological: Negative for dizziness, tingling, tremors, focal weakness, seizures, weakness and headaches.  Psychiatric/Behavioral: Negative for depression. The patient is not nervous/anxious.    DRUG ALLERGIES:   Allergies  Allergen Reactions  . Amoxicillin Itching   VITALS:  Blood pressure 163/94, pulse 73, temperature 97.7 F (36.5 C), temperature source Oral, resp. rate 16, height 5\' 5"  (1.651 m), weight 70.262 kg (154 lb 14.4 oz), SpO2 100 %. PHYSICAL EXAMINATION:  Physical Exam  Constitutional: She is oriented to person, place, and time and well-developed, well-nourished, and in no distress.  HENT:  Head: Normocephalic and atraumatic.  Eyes: Conjunctivae and EOM are normal. Pupils are equal, round, and reactive to light.  Neck: Normal range of motion. Neck supple. No tracheal deviation present. No thyromegaly present.  Cardiovascular: Normal rate and normal heart sounds.   An irregularly irregular rhythm present.  Pulmonary/Chest: Effort normal and breath sounds normal. No respiratory distress. She has no wheezes. She exhibits no tenderness.  Abdominal: Soft. Bowel sounds are normal. She exhibits no distension. There is no tenderness.  Musculoskeletal: Normal range of motion.  Neurological: She is alert and oriented to person, place, and time. No cranial nerve deficit.  Skin: Skin is warm and dry. No rash noted.  Psychiatric: Mood and affect normal.   LABORATORY PANEL:   CBC  Recent Labs Lab 09/24/15 0254  WBC 7.2  HGB 14.8  HCT 45.8  PLT 166   ------------------------------------------------------------------------------------------------------------------ Chemistries   Recent Labs Lab 09/23/15 0313 09/24/15 0254  NA 140 139  K 3.3* 4.1  CL 105 107  CO2 27 25  GLUCOSE 137* 99  BUN 20 15  CREATININE 0.87 0.65  CALCIUM 9.6 9.3  MG 2.0  --    RADIOLOGY:  Ct Angio Chest Pe W Or Wo Contrast  09/24/2015  CLINICAL DATA:  Chest pain EXAM: CT ANGIOGRAPHY CHEST WITH CONTRAST TECHNIQUE: Multidetector CT imaging of the chest was performed using the standard protocol during bolus administration of intravenous contrast. Multiplanar CT image reconstructions and MIPs were obtained to evaluate the vascular anatomy. CONTRAST:  100 mL Isovue 370 nonionic COMPARISON:  Chest radiograph September 23, 2015 FINDINGS: Cardiovascular: There is no demonstrable pulmonary embolus. There is no thoracic aortic aneurysm. The contrast bolus does not sufficiently opacified the aorta to assess for possible dissection. There are scattered foci of atherosclerotic calcification in the aorta. The visualized great vessels appear unremarkable. There is evidence of left ventricular hypertrophy. The pericardium is not appreciably thickened. There is a small amount of coronary artery calcification noted. Mediastinum/Nodes: Thyroid appears unremarkable. There  are scattered subcentimeter  mediastinal lymph nodes. There is no adenopathy by size criteria on this study. Lungs/Pleura: There is mild scarring in the left base. There is mild bibasilar subsegmental atelectasis. There is no edema or consolidation. There is a degree of upper and lower lobe bronchiectatic change bilaterally. Upper Abdomen: There is reflux of contrast into the inferior vena cava. There is a 4 mm calculus in the upper pole of the left kidney. There is a small granuloma in the spleen. Visualized upper abdominal structures otherwise appear unremarkable. Musculoskeletal: There is mild mid thoracic dextroscoliosis. There is mild degenerative change in the thoracic spine. No blastic or lytic bone lesions. Review of the MIP images confirms the above findings. IMPRESSION: No demonstrable pulmonary embolus. No thoracic aortic aneurysm. The contrast bolus timing does not allow for assessment for potential thoracic aortic dissection. If that entity is of potential clinical concern, would suggest thoracic MR to further assess. There is left ventricular hypertrophy. There is a small amount of coronary artery calcification evident. No demonstrable adenopathy. There is upper and lower lobe bronchiectatic change. No lung edema or consolidation. Reflux of contrast into the inferior vena cava potentially may indicate a degree of elevated right heart pressure. Nonobstructing 4 mm calculus upper pole left kidney. Electronically Signed   By: Bretta Bang III M.D.   On: 09/24/2015 14:46   ASSESSMENT AND PLAN:  . Atrial flutter - Monitor on telemetry -TSH high, check Free T3,T4, 2-D echo showed normal EF - HR under control by mouth metoprolol 50 mg by mouth twice a day, also IV Lopressor as needed for heart or greater than 110 - on Heparin full dose and will switch to Eliquis on D/C - Myoview in am per cardio  * Chest pain - will get CT chest to r/o PE - neg  * Abnormal Thyroid function - High TSH but normal Free T3, T4 - Will get  endocrine c/s - may need further w/up (Antibodies.Marland Kitchen) if felt appropriate - can be done as an outpt also.  Hypertension - metoprolol, lisinopril - add norvasc for better BP control  Hypokalemia -Repleted and resolved  Tobacco abuse -Nicotine patch     All the records are reviewed and case discussed with Care Management/Social Worker. Management plans discussed with the patient, family and they are in agreement.  CODE STATUS: FULL CODE  TOTAL TIME TAKING CARE OF THIS PATIENT: 35 minutes.   More than 50% of the time was spent in counseling/coordination of care: YES  POSSIBLE D/C IN 1-2 DAYS, DEPENDING ON CLINICAL CONDITION. And cardio eval, await Myoview tomorrow  Delfino Lovett M.D on 09/24/2015 at 4:11 PM  Between 7am to 6pm - Pager - [phone]  After 6pm go to [email or web address] - Scientist, research (life sciences) [place] [facility]  Office  [phone]  CC: Primary care physician; No primary care provider on file.  Note: This dictation was prepared with Dragon dictation along with smaller phrase technology. Any transcriptional errors that result from this process are unintentional.

## 2015-09-24 NOTE — Care Management (Signed)
Uninsured patient. Met with patient at bedside. She lives at home with her boyfriend. She works part time. Plans ot apply for Medicaid. Applications given for [facility]. Coupon given for free 30 day trail of eliquis. Encouraged patient to follow up with cardiologist for samples. Also provided number to patient assistance drug company program for Eliquis through the drug company.

## 2015-09-24 NOTE — Progress Notes (Signed)
Patient: Pamela ModyWanda S Biegel / Admit Date: 09/23/2015 / Date of Encounter: 09/24/2015, 7:44 AM   Subjective: No acute overnight events. Heart rate remains well controlled in the 70's bpm. Remains in coarse Afib/flutter. Troponin trend 0.04-->0.04-->0.03. Free T4, total T3, free T3 all normal in the setting of TSH >21. She notes sharp intermittent LUQ abdominal pain this morning that is reproducible to palpation and worse with deep inspiration.   Review of Systems: Review of Systems  Constitutional: Positive for weight loss and malaise/fatigue. Negative for fever, chills and diaphoresis.  HENT: Negative for congestion.   Eyes: Negative for discharge.  Respiratory: Negative for cough, hemoptysis, sputum production, shortness of breath and wheezing.   Cardiovascular: Negative for chest pain, palpitations, orthopnea, claudication, leg swelling and PND.  Gastrointestinal: Positive for abdominal pain. Negative for heartburn, nausea, vomiting, diarrhea, constipation, blood in stool and melena.  Genitourinary: Negative for hematuria.  Musculoskeletal: Negative for falls.  Skin: Negative for rash.  Neurological: Positive for weakness. Negative for dizziness, sensory change, speech change, focal weakness and loss of consciousness.  Endo/Heme/Allergies: Does not bruise/bleed easily.  Psychiatric/Behavioral: The patient is not nervous/anxious.     Objective: Telemetry: Coarse Afib/f;utter with variable AV conduction, 70's bpm Physical Exam: Blood pressure 143/98, pulse 74, temperature 97.5 F (36.4 C), temperature source Oral, resp. rate 16, height 5\' 5"  (1.651 m), weight 154 lb 14.4 oz (70.262 kg), SpO2 100 %. Body mass index is 25.78 kg/(m^2). General: Well developed, well nourished, in no acute distress. Head: Normocephalic, atraumatic, sclera non-icteric, no xanthomas, nares are without discharge. Neck: Negative for carotid bruits. JVP not elevated. Lungs: Clear bilaterally to auscultation  without wheezes, rales, or rhonchi. Breathing is unlabored. Heart: Irregular S1 S2 without murmurs, rubs, or gallops.  Abdomen: Soft, non-tender, non-distended with normoactive bowel sounds. Gentle palpation reproduces LUQ pain.  Extremities: No clubbing or cyanosis. No edema. Distal pedal pulses are 2+ and equal bilaterally. Neuro: Alert and oriented X 3. Moves all extremities spontaneously. Psych:  Responds to questions appropriately with a normal affect.   Intake/Output Summary (Last 24 hours) at 09/24/15 0744 Last data filed at 09/24/15 0414  Gross per 24 hour  Intake      0 ml  Output    900 ml  Net   -900 ml    Inpatient Medications:  . amLODipine  5 mg Oral Daily  . metoprolol tartrate  50 mg Oral BID  . nicotine  14 mg Transdermal Daily  . nitroGLYCERIN  1 inch Topical Once  . potassium chloride  30 mEq Oral BID  . sodium chloride flush  3 mL Intravenous Q12H   Infusions:  . heparin 950 Units/hr (09/24/15 0531)    Labs:  Recent Labs  09/23/15 0313 09/24/15 0254  NA 140 139  K 3.3* 4.1  CL 105 107  CO2 27 25  GLUCOSE 137* 99  BUN 20 15  CREATININE 0.87 0.65  CALCIUM 9.6 9.3  MG 2.0  --    No results for input(s): AST, ALT, ALKPHOS, BILITOT, PROT, ALBUMIN in the last 72 hours.  Recent Labs  09/23/15 0313 09/24/15 0254  WBC 7.0 7.2  HGB 14.6 14.8  HCT 43.3 45.8  MCV 73.4* 74.2*  PLT 174 166    Recent Labs  09/23/15 0313 09/23/15 1012 09/23/15 1610 09/24/15 0254  TROPONINI <0.03 0.04* 0.04* <0.03   Invalid input(s): POCBNP No results for input(s): HGBA1C in the last 72 hours.   Weights: Filed Weights   09/23/15 0300  09/23/15 0754  Weight: 156 lb (70.761 kg) 154 lb 14.4 oz (70.262 kg)     Radiology/Studies:  Dg Chest 2 View  09/23/2015  CLINICAL DATA:  Epigastric chest pain and burning with radiation to both sides of the neck. Nausea, dizziness, lightheadedness. Smoker. EXAM: CHEST  2 VIEW COMPARISON:  01/06/2014 FINDINGS: Mild  hyperinflation consistent with emphysema. Normal heart size and pulmonary vascularity. No focal airspace disease or consolidation in the lungs. No blunting of costophrenic angles. No pneumothorax. Mediastinal contours appear intact. Calcified and tortuous aorta. Degenerative changes in the spine. IMPRESSION: No active cardiopulmonary disease. Electronically Signed   By: Burman Nieves M.D.   On: 09/23/2015 03:38     Assessment and Plan  Principal Problem:   Atrial fibrillation with RVR (HCC) Active Problems:   Abnormal thyroid blood test   Hypokalemia   Essential hypertension   Tobacco abuse    1. New onset Afib/flutter with RVR: -Symptoms began shortly after 1:30 AM on 7/11 -Very symptomatic with tachycardic heart rate, though not so with rate control -Cannot guarantee she has not been in this rhythm for longer than overnight given her asymptomatic nature with adequate rate control -Her arrhythmia is likely in the setting of her severely abnormal thyroid function study with a TSH > 21 and her hypokalemia at 3.3 upon admission -Pursue rate control at this time with metoprolol -Continue heparin gtt until echo is read to assess EF and wall motion for possible cardiac cath this admission given the below -CHADS2VASc at least 2 (HTN, sex category) -She will need long term full-dose anticoagulation with Eliquis -Echo pending -Check EKG this morning  2. Elevated troponin: -<0.03-->0.04-->0.04-->0.03 -Likely in the setting of her tachycardia -Continue heparin gtt in place of Eliquis as above until echo is read -On metoprolol as above  3. Abnormal thyroid function: -TSH > 21, thyroid studies normal -Recommend anti-TPO antibodies, defer to IM for evaluation   4. Hypokalemia: -Resolved -Monitor  5. Essential hypertension: -On metoprolol as above -Add Norvasc 5 mg daily (if needed at a later date can change to diltiazem for added rate control, though this is not currently  needed) -Monitor   6. Longstanding tobacco abuse: -Cessation advised   7. LUQ abdominal pain: -Per IM   Signed, Eula Listen, PA-C [facility] Pager: [phone] 09/24/2015, 7:44 AM

## 2015-09-24 NOTE — Progress Notes (Signed)
ANTICOAGULATION CONSULT NOTE - Initial Consult  Pharmacy Consult for Heparin Indication: atrial fibrillation  Allergies  Allergen Reactions  . Amoxicillin Itching   Patient Measurements: Height: 5\' 5"  (165.1 cm) Weight: 154 lb 14.4 oz (70.262 kg) IBW/kg (Calculated) : 57  Vital Signs:  Temp: 97.7 F (36.5 C) (07/12 1221) Temp Source: Oral (07/12 1221) BP: 163/94 mmHg (07/12 1221) Pulse Rate: 73 (07/12 1221)   Recent Labs  09/23/15 0313  09/23/15 1610 09/23/15 1759 09/24/15 0254 09/24/15 1116  HGB 14.6  --   --   --  14.8  --   HCT 43.3  --   --   --  45.8  --   PLT 174  --   --   --  166  --   APTT  --   --   --  31 49* 80*  HEPARINUNFRC  --   --   --  0.79* 0.59 0.58  CREATININE 0.87  --   --   --  0.65  --   TROPONINI <0.03  < > 0.04*  --  <0.03 <0.03  < > = values in this interval not displayed.  Estimated Creatinine Clearance: 70.8 mL/min (by C-G formula based on Cr of 0.65).  Medical History: Past Medical History  Diagnosis Date  . Essential hypertension   . Tobacco abuse   . Abnormal thyroid blood test     Assessment: 65 yo female started on Apixaban for A. Fib. Cardiology consulted pharmacy for transition from Apixaban to heparin due to abnormal EKG and elevated troponin.   Patient received one dose inpatient of Apixaban at 0924 (7/11).     Goal of Therapy:  Heparin level 0.3-0.7 units/ml Monitor platelets by anticoagulation protocol: Yes APTT: 66-102   Plan:  Current orders for heparin 950 untis/hr. APTT therapeutic, will recheck confirmatory aPTT in 6 hours. Repeat HL, APTT, CBC with AM labs.   Garlon HatchetJody Amariya Liskey, PharmD Clinical Pharmacist  09/24/2015 1:17 PM

## 2015-09-24 NOTE — Consult Note (Signed)
ENDOCRINOLOGY CONSULTATION  REFERRING PHYSICIAN: Delfino Lovett, MD CONSULTING PHYSICIAN:  A. Wendall Mola, MD.  CHIEF COMPLAINT:  Elevated TSH  History of Present Illness  [name] is a 65 y.o. female  is seen in consultation for elevated TSH. Notes and records were reviewed. She presented yesterday with chest pressure and palpitations found to have atrial fib/flutter. Labs notable for elevated TSH of 20 uIU/ml.   She does not have a PCP. She has not had routine thyroid labs done, that she can recall, in over 5 yrs. She reports that about 20 yrs ago she was found to have a goiter and possibly hyperthyroidism, treated with I-131 ablation. Details not entirely clear as she cannot recall specifically what was done and there are no records to confirm. She was treated with thyroid hormone replacement for a time, perhaps one year then may have stopped the medication on her own. She denies heat intolerance, dysphagia or neck pain. No recent change in appearance of neck. She did receive IV contrast for CT chest today, however that seems to have been given after thyroid labs were drawn yesterday. No recent fever. She does smoke. She has considered quitting.   Medical history Past Medical History  Diagnosis Date  . Essential hypertension   . Tobacco abuse   . Abnormal thyroid blood test      Surgical history Past Surgical History  Procedure Laterality Date  . Abdominal hysterectomy       Family history Family History  Problem Relation Age of Onset  . Hypertension Mother   . Diabetes Mother   . Diabetes Father      Social History Social History  Substance Use Topics  . Smoking status: Current Some Day Smoker  . Smokeless tobacco: Never Used  . Alcohol Use: Yes     Comment: occasionally     Allergies Allergies  Allergen Reactions  . Amoxicillin Itching      Medications . [START ON 09/25/2015] lisinopril  5 mg Oral Daily  . metoprolol tartrate  50 mg Oral BID  .  nicotine  14 mg Transdermal Daily  . nitroGLYCERIN  1 inch Topical Once  . sodium chloride flush  3 mL Intravenous Q12H     Review of Systems GENERAL:  Patient denies weight-loss, weight-gain or fevers.  Patient denies fatigue. NEUROLOGIC:  Patient denies headaches. EYES:  Patient denies blurred vision. NECK:  Patient denies new neck swelling, neck pain or difficulty with swallowing. RESPIRATORY:  Patient denies SOB.  Patient denies cough.   GASTROINTESTINAL:  Patient denies abdominal pain, nausea and vomiting and constipation.  Patient denies blood in the stool. GENITOURINARY:  Patient denies dysuria and hematuria.  EXTREMITIES:  Patient denies leg swelling. HEME:  Patient denies easy bruisability. SKIN:  Patient denies any rash.    Exam BP 163/94 mmHg  Pulse 73  Temp(Src) 97.7 F (36.5 C) (Oral)  Resp 16  Ht  (1.651 m)  Wt 70.262 kg (154 lb 14.4 oz)  BMI 25.78 kg/m2  SpO2 100%  GEN: well developed, well nourished female, in NAD. HEENT: No proptosis, lid lag or stare. Oropharynx is clear. EOMI. NECK: supple, trachea midline, thyroid exam demonstrates +thyromegaly with 1-2 cm palpable midline nodule which may be an isthmus nodule. No neck TTP. No thyroid bruit.  RESPIRATORY: clear bilaterally, no wheeze, good inspiratory effort. CV: No carotid bruits, irreg irreg. MUSCULOSKELETAL: normal gait and station, no clubbing, no tremor ABD: soft, NT/ND, no organomegaly EXT: no edema SKIN: no dermatopathy or  rash or acanthosis nigricans LYMPH: no submandibular or supraclavicular LAD NEURO: PERRL, 2+ knee DTRs equal and symmetric PSYC: alert and oriented, good insight  Labs Results for orders placed or performed during the hospital encounter of 09/23/15 (from the past 24 hour(s))  APTT     Status: None   Collection Time: 09/23/15  5:59 PM  Result Value Ref Range   aPTT 31 24 - 36 seconds  Heparin level (unfractionated)     Status: Abnormal   Collection Time: 09/23/15  5:59  PM  Result Value Ref Range   Heparin Unfractionated 0.79 (H) 0.30 - 0.70 IU/mL  Heparin level (unfractionated)     Status: None   Collection Time: 09/24/15  2:54 AM  Result Value Ref Range   Heparin Unfractionated 0.59 0.30 - 0.70 IU/mL  APTT     Status: Abnormal   Collection Time: 09/24/15  2:54 AM  Result Value Ref Range   aPTT 49 (H) 24 - 36 seconds  Basic metabolic panel     Status: None   Collection Time: 09/24/15  2:54 AM  Result Value Ref Range   Sodium 139 135 - 145 mmol/L   Potassium 4.1 3.5 - 5.1 mmol/L   Chloride 107 101 - 111 mmol/L   CO2 25 22 - 32 mmol/L   Glucose, Bld 99 65 - 99 mg/dL   BUN 15 6 - 20 mg/dL   Creatinine, Ser 1.190.65 0.44 - 1.00 mg/dL   Calcium 9.3 8.9 - 14.710.3 mg/dL   GFR calc non Af Amer >60 >60 mL/min   GFR calc Af Amer >60 >60 mL/min   Anion gap 7 5 - 15  CBC     Status: Abnormal   Collection Time: 09/24/15  2:54 AM  Result Value Ref Range   WBC 7.2 3.6 - 11.0 K/uL   RBC 6.17 (H) 3.80 - 5.20 MIL/uL   Hemoglobin 14.8 12.0 - 16.0 g/dL   HCT 82.945.8 56.235.0 - 13.047.0 %   MCV 74.2 (L) 80.0 - 100.0 fL   MCH 24.0 (L) 26.0 - 34.0 pg   MCHC 32.3 32.0 - 36.0 g/dL   RDW 86.515.2 (H) 78.411.5 - 69.614.5 %   Platelets 166 150 - 440 K/uL  Troponin I     Status: None   Collection Time: 09/24/15  2:54 AM  Result Value Ref Range   Troponin I <0.03 <0.03 ng/mL  Heparin level (unfractionated)     Status: None   Collection Time: 09/24/15 11:16 AM  Result Value Ref Range   Heparin Unfractionated 0.58 0.30 - 0.70 IU/mL  APTT     Status: Abnormal   Collection Time: 09/24/15 11:16 AM  Result Value Ref Range   aPTT 80 (H) 24 - 36 seconds  Troponin I     Status: None   Collection Time: 09/24/15 11:16 AM  Result Value Ref Range   Troponin I <0.03 <0.03 ng/mL     Assessment Hypothyroidism.    Possible isthmus thyroid nodule.  Plan -Elevated TSH level of 20 is consistent with hypothyroidism. History suggests post-ablative hypothyroidism. -She would benefit from thyroid  hormone replacement however in setting of a.fib/flutter, this should definitely wait. Likelt she has had hypothyroidism for many years. Treatment should wait until acute clinical condition has stabilized or resolved.  -We discussed hypothyroidism including treatment and natural history. -She was advised to see me in clinic for follow up in 2-3 weeks to discussed her clinical condition and possibly starting treatment. -No need to repeat thyroid labs  during this hospitalization. -No need for imaging of thyroid, as this can and should be deferred to out-patient follow up.  Thank you for the kind consultation. Will arrange out-patient follow up.

## 2015-09-25 ENCOUNTER — Inpatient Hospital Stay (HOSPITAL_BASED_OUTPATIENT_CLINIC_OR_DEPARTMENT_OTHER): Payer: Medicaid Other

## 2015-09-25 ENCOUNTER — Encounter: Payer: Self-pay | Admitting: Radiology

## 2015-09-25 DIAGNOSIS — I4892 Unspecified atrial flutter: Secondary | ICD-10-CM

## 2015-09-25 LAB — NM MYOCAR MULTI W/SPECT W/WALL MOTION / EF
CHL CUP NUCLEAR SRS: 4
CHL CUP NUCLEAR SSS: 5
CHL CUP STRESS STAGE 1 GRADE: 0 %
CHL CUP STRESS STAGE 1 HR: 76 {beats}/min
CHL CUP STRESS STAGE 3 GRADE: 0 %
CHL CUP STRESS STAGE 3 SPEED: 0 mph
CHL CUP STRESS STAGE 4 HR: 114 {beats}/min
CHL CUP STRESS STAGE 4 SBP: 136 mmHg
CSEPEW: 1 METS
CSEPHR: 73 %
CSEPPMHR: 71 %
LV sys vol: 37 mL
LVDIAVOL: 67 mL (ref 46–106)
NUC STRESS TID: 0.89
Peak HR: 112 {beats}/min
Rest HR: 77 {beats}/min
SDS: 3
Stage 1 Speed: 0 mph
Stage 2 Grade: 0 %
Stage 2 HR: 77 {beats}/min
Stage 2 Speed: 0 mph
Stage 3 HR: 112 {beats}/min
Stage 4 DBP: 88 mmHg
Stage 4 Grade: 0 %
Stage 4 Speed: 0 mph

## 2015-09-25 LAB — HEPARIN LEVEL (UNFRACTIONATED)
Heparin Unfractionated: 0.61 IU/mL (ref 0.30–0.70)
Heparin Unfractionated: 0.51 IU/mL (ref 0.30–0.70)

## 2015-09-25 LAB — CBC
HCT: 46.4 % (ref 35.0–47.0)
Hemoglobin: 15.5 g/dL (ref 12.0–16.0)
MCH: 24.4 pg — ABNORMAL LOW (ref 26.0–34.0)
MCHC: 33.3 g/dL (ref 32.0–36.0)
MCV: 73.4 fL — AB (ref 80.0–100.0)
PLATELETS: 171 10*3/uL (ref 150–440)
RBC: 6.32 MIL/uL — ABNORMAL HIGH (ref 3.80–5.20)
RDW: 15 % — AB (ref 11.5–14.5)
WBC: 7 10*3/uL (ref 3.6–11.0)

## 2015-09-25 LAB — APTT
aPTT: 95 seconds — ABNORMAL HIGH (ref 24–36)
aPTT: 72 seconds — ABNORMAL HIGH (ref 24–36)

## 2015-09-25 MED ORDER — APIXABAN 5 MG PO TABS: 5.0000 mg | ORAL_TABLET | Freq: Two times a day (BID) | ORAL | Status: DC

## 2015-09-25 MED ORDER — REGADENOSON 0.4 MG/5ML IV SOLN
0.4000 mg | Freq: Once | INTRAVENOUS | Status: AC
  Administered 2015-09-25: 0.4 mg via INTRAVENOUS
  Filled 2015-09-25: qty 5

## 2015-09-25 MED ORDER — TECHNETIUM TC 99M TETROFOSMIN IV KIT
13.0000 | PACK | Freq: Once | INTRAVENOUS | Status: AC | PRN
  Administered 2015-09-25: 13.465 via INTRAVENOUS

## 2015-09-25 MED ORDER — TECHNETIUM TC 99M TETROFOSMIN IV KIT
30.0000 | PACK | Freq: Once | INTRAVENOUS | Status: AC | PRN
  Administered 2015-09-25: 31.95 via INTRAVENOUS

## 2015-09-25 MED ORDER — METOPROLOL TARTRATE 50 MG PO TABS
50.0000 mg | ORAL_TABLET | Freq: Two times a day (BID) | ORAL | Status: DC
Start: 2015-09-25 — End: 2015-11-06

## 2015-09-25 MED ORDER — LISINOPRIL 20 MG PO TABS: 20.0000 mg | ORAL_TABLET | Freq: Every day | ORAL | Status: DC

## 2015-09-25 NOTE — Progress Notes (Signed)
ANTICOAGULATION CONSULT NOTE  Pharmacy Consult for Heparin Indication: atrial fibrillation  Allergies  Allergen Reactions  . Amoxicillin Itching   Patient Measurements: Height: 5\' 5"  (165.1 cm) Weight: 154 lb 14.4 oz (70.262 kg) IBW/kg (Calculated) : 57  Vital Signs:  Temp: 97.8 F (36.6 C) (07/13 1137) Temp Source: Oral (07/13 1137) BP: 125/84 mmHg (07/13 1411) Pulse Rate: 89 (07/13 1137)   Recent Labs  09/23/15 0313  09/23/15 1610  09/24/15 0254 09/24/15 1116 09/24/15 1700 09/25/15 0253 09/25/15 1132  HGB 14.6  --   --   --  14.8  --   --  15.5  --   HCT 43.3  --   --   --  45.8  --   --  46.4  --   PLT 174  --   --   --  166  --   --  171  --   APTT  --   --   --   < > 49* 80* 61* 95* 72*  HEPARINUNFRC  --   --   --   < > 0.59 0.58  --  0.61 0.51  CREATININE 0.87  --   --   --  0.65  --   --   --   --   TROPONINI <0.03  < > 0.04*  --  <0.03 <0.03  --   --   --   < > = values in this interval not displayed.  Estimated Creatinine Clearance: 70.8 mL/min (by C-G formula based on Cr of 0.65).  Medical History: Past Medical History  Diagnosis Date  . Essential hypertension   . Tobacco abuse   . Abnormal thyroid blood test     Assessment: 65 yo female started on Apixaban for A. Fib. Cardiology consulted pharmacy for transition from Apixaban to heparin due to abnormal EKG and elevated troponin.   Patient received one dose inpatient of Apixaban at 0924 (7/11).     Goal of Therapy:  Heparin level 0.3-0.7 units/ml Monitor platelets by anticoagulation protocol: Yes APTT: 66-102   Plan:  Current orders for heparin 1050 units/hr. Repeat APTT/HL therapeutic. HL 0.51, aPTT 72 sec; results correlate - can transition to checking HL at this time. Continue current orders, will recheck HL and CBC with AM labs.   Martyn MalayBarefoot,Shelley Pooley C Clinical Pharmacist  09/25/2015 2:32 PM

## 2015-09-25 NOTE — Discharge Summary (Signed)
[facility] [place] [facility]   PATIENT NAME: [name]    MR#:  [id number]  DATE OF BIRTH:  01-Aug-1950  DATE OF ADMISSION:  09/23/2015 ADMITTING PHYSICIAN: Gery Pray, MD  DATE OF DISCHARGE: 09/25/2015  4:32 PM  PRIMARY CARE PHYSICIAN: No primary care provider on file.    ADMISSION DIAGNOSIS:  Other cardiac arrhythmia [I49.8] Chest pain, unspecified chest pain type [R07.9]  DISCHARGE DIAGNOSIS:  Principal Problem:   Atrial flutter (HCC) Active Problems:   Atrial fibrillation with RVR (HCC)   Abnormal thyroid blood test   Essential hypertension   Tobacco abuse   Hypokalemia   SECONDARY DIAGNOSIS:   Past Medical History  Diagnosis Date  . Essential hypertension   . Tobacco abuse   . Abnormal thyroid blood test     HOSPITAL COURSE:   65 y/o F with no known PMH admitted with palpitations and noted to have atrial flutter.  #1 New onset Atrial Flutter- now rate controlled with metoprolol oral BID. - still remains in flutter rhythm - anticoagulated with IV heparin in the hospital- changed to oral eliquis at discharge - Appreciate cardiology consult - Stress test remains negative- low risk, done for chest pain. - 2D ECHO with normal EF - F/u with cardiology as outpatient to consider atrial flutter ablation  #2 Sub clinical hypothyroidism- TSH elevated at 20, normal free T3 and T4 - Appreciate endocrinology consult - However recommended follow up as outpatient due to her acute setting of atrial flutter/fib, and patient is not symptomatic  #3 HTN- metoprolol and lisinopril  #4 Tobacco use disorder- counseled against smoking Was on nicotine patch while in the hospital  Discharge today.  DISCHARGE CONDITIONS:   Stable  CONSULTS OBTAINED:  Treatment Team:  Iran Ouch, MD Raj Janus, MD  DRUG ALLERGIES:   Allergies  Allergen Reactions  . Amoxicillin Itching    DISCHARGE MEDICATIONS:   Discharge Medication  List as of 09/25/2015  4:13 PM    START taking these medications   Details  apixaban (ELIQUIS) 5 MG TABS tablet Take 1 tablet (5 mg total) by mouth 2 (two) times daily., Starting 09/25/2015, Until Discontinued, Print    lisinopril (PRINIVIL,ZESTRIL) 20 MG tablet Take 1 tablet (20 mg total) by mouth daily., Starting 09/25/2015, Until Discontinued, Print    metoprolol (LOPRESSOR) 50 MG tablet Take 1 tablet (50 mg total) by mouth 2 (two) times daily., Starting 09/25/2015, Until Discontinued, Print      STOP taking these medications     oxyCODONE-acetaminophen (PERCOCET/ROXICET) 5-325 MG tablet          DISCHARGE INSTRUCTIONS:   1. PCP follow up in 2 weeks 2. Cardiology f/u in 2 weeks 3. Endocrinology f/u in 3-4 weeks  If you experience worsening of your admission symptoms, develop shortness of breath, life threatening emergency, suicidal or homicidal thoughts you must seek medical attention immediately by calling 911 or calling your MD immediately  if symptoms less severe.  You Must read complete instructions/literature along with all the possible adverse reactions/side effects for all the Medicines you take and that have been prescribed to you. Take any new Medicines after you have completely understood and accept all the possible adverse reactions/side effects.   Please note  You were cared for by a hospitalist during your hospital stay. If you have any questions about your discharge medications or the care you received while you were in the hospital after you are discharged, you can call the unit  and asked to speak with the hospitalist on call if the hospitalist that took care of you is not available. Once you are discharged, your primary care physician will handle any further medical issues. Please note that NO REFILLS for any discharge medications will be authorized once you are discharged, as it is imperative that you return to your primary care physician (or establish a relationship  with a primary care physician if you do not have one) for your aftercare needs so that they can reassess your need for medications and monitor your lab values.    Today   CHIEF COMPLAINT:   Chief Complaint  Patient presents with  . Chest Pain    VITAL SIGNS:  Blood pressure 125/84, pulse 89, temperature 97.8 F (36.6 C), temperature source Oral, resp. rate 18, height 5\' 5"  (1.651 m), weight 70.262 kg (154 lb 14.4 oz), SpO2 100 %.  I/O:   Intake/Output Summary (Last 24 hours) at 09/25/15 1946 Last data filed at 09/25/15 1445  Gross per 24 hour  Intake      0 ml  Output    850 ml  Net   -850 ml    PHYSICAL EXAMINATION:   Physical Exam  GENERAL:  65 y.o.-year-old patient lying in the bed with no acute distress.  EYES: Pupils equal, round, reactive to light and accommodation. No scleral icterus. Extraocular muscles intact.  HEENT: Head atraumatic, normocephalic. Oropharynx and nasopharynx clear.  NECK:  Supple, no jugular venous distention. No thyroid enlargement, no tenderness.  LUNGS: Normal breath sounds bilaterally, no wheezing, rales,rhonchi or crepitation. No use of accessory muscles of respiration.  CARDIOVASCULAR: S1, S2 normal rate, but irregular rhythm. No murmurs, rubs, or gallops.  ABDOMEN: Soft, non-tender, non-distended. Bowel sounds present. No organomegaly or mass.  EXTREMITIES: No pedal edema, cyanosis, or clubbing.  NEUROLOGIC: Cranial nerves II through XII are intact. Muscle strength 5/5 in all extremities. Sensation intact. Gait not checked.  PSYCHIATRIC: The patient is alert and oriented x 3.  SKIN: No obvious rash, lesion, or ulcer.   DATA REVIEW:   CBC  Recent Labs Lab 09/25/15 0253  WBC 7.0  HGB 15.5  HCT 46.4  PLT 171    Chemistries   Recent Labs Lab 09/23/15 0313 09/24/15 0254  NA 140 139  K 3.3* 4.1  CL 105 107  CO2 27 25  GLUCOSE 137* 99  BUN 20 15  CREATININE 0.87 0.65  CALCIUM 9.6 9.3  MG 2.0  --     Cardiac  Enzymes  Recent Labs Lab 09/24/15 1116  TROPONINI <0.03    Microbiology Results  No results found for this or any previous visit.  RADIOLOGY:  Ct Angio Chest Pe W Or Wo Contrast  09/24/2015  CLINICAL DATA:  Chest pain EXAM: CT ANGIOGRAPHY CHEST WITH CONTRAST TECHNIQUE: Multidetector CT imaging of the chest was performed using the standard protocol during bolus administration of intravenous contrast. Multiplanar CT image reconstructions and MIPs were obtained to evaluate the vascular anatomy. CONTRAST:  100 mL Isovue 370 nonionic COMPARISON:  Chest radiograph September 23, 2015 FINDINGS: Cardiovascular: There is no demonstrable pulmonary embolus. There is no thoracic aortic aneurysm. The contrast bolus does not sufficiently opacified the aorta to assess for possible dissection. There are scattered foci of atherosclerotic calcification in the aorta. The visualized great vessels appear unremarkable. There is evidence of left ventricular hypertrophy. The pericardium is not appreciably thickened. There is a small amount of coronary artery calcification noted. Mediastinum/Nodes: Thyroid appears unremarkable. There are scattered  subcentimeter mediastinal lymph nodes. There is no adenopathy by size criteria on this study. Lungs/Pleura: There is mild scarring in the left base. There is mild bibasilar subsegmental atelectasis. There is no edema or consolidation. There is a degree of upper and lower lobe bronchiectatic change bilaterally. Upper Abdomen: There is reflux of contrast into the inferior vena cava. There is a 4 mm calculus in the upper pole of the left kidney. There is a small granuloma in the spleen. Visualized upper abdominal structures otherwise appear unremarkable. Musculoskeletal: There is mild mid thoracic dextroscoliosis. There is mild degenerative change in the thoracic spine. No blastic or lytic bone lesions. Review of the MIP images confirms the above findings. IMPRESSION: No demonstrable pulmonary  embolus. No thoracic aortic aneurysm. The contrast bolus timing does not allow for assessment for potential thoracic aortic dissection. If that entity is of potential clinical concern, would suggest thoracic MR to further assess. There is left ventricular hypertrophy. There is a small amount of coronary artery calcification evident. No demonstrable adenopathy. There is upper and lower lobe bronchiectatic change. No lung edema or consolidation. Reflux of contrast into the inferior vena cava potentially may indicate a degree of elevated right heart pressure. Nonobstructing 4 mm calculus upper pole left kidney. Electronically Signed   By: Bretta BangWilliam  Woodruff III M.D.   On: 09/24/2015 14:46   Nm Myocar Multi W/spect W/wall Motion / Ef  09/25/2015   Rhythm remained in atrial flutter throughout the study. There was no ST segment deviation noted during stress.  Myocardial perfusion imaging revealed normal perfusion on stress and rest. No evidence of ischemia.  This is a low risk study.  Nuclear stress EF: 47%. Please verify with echocardiogram.     EKG:   Orders placed or performed during the hospital encounter of 09/23/15  . EKG 12-Lead  . EKG 12-Lead  . ED EKG within 10 minutes  . ED EKG within 10 minutes  . EKG 12-Lead  . EKG 12-Lead  . EKG 12-Lead  . EKG 12-Lead  . EKG 12-Lead  . EKG 12-Lead      Management plans discussed with the patient, family and they are in agreement.  CODE STATUS:  Code Status History    Date Active Date Inactive Code Status Order ID Comments User Context   09/23/2015  7:57 AM 09/25/2015  7:33 PM Full Code [id number]  Gery Prayebby Crosley, MD Inpatient      TOTAL TIME TAKING CARE OF THIS PATIENT: 37 minutes.    Enid BaasKALISETTI,Mackensi Mahadeo M.D on 09/25/2015 at 7:46 PM  Between 7am to 6pm - Pager - [phone]  After 6pm go to [email or web address] - password [email or web address] [email or web address]  [facility]  Office  [phone]  CC: Primary care physician; No primary care provider on  file.

## 2015-09-25 NOTE — Progress Notes (Signed)
    She is for nuclear stress test today. Heart rate remains in the 50's bpm in atrial flutter with variable AV block. On metoprolol 50 mg bid. Would pursue rate control at this time as if she were to undergo DCCV with a rate in the 50's bpm while in atrial flutter she may become significantly bradycardic. Plan for change to Eliquis if nuclear study is normal and follow up with EP for atrial flutter ablation in the outpatient setting.

## 2015-09-25 NOTE — Progress Notes (Signed)
Pt discharged to home via wc.  Instructions and rx(Eliquis, Metoprolol, Lisinopril) given to pt.  Questions answered.  No distress.

## 2015-09-25 NOTE — Progress Notes (Signed)
Patient Name: Pamela ModyWanda S Hatchell Date of Encounter: 09/25/2015  No care team member to display  PROBLEM LIST  Principal Problem:   Atrial flutter (HCC) Active Problems:   Atrial fibrillation with RVR (HCC)   Abnormal thyroid blood test   Essential hypertension   Tobacco abuse   Hypokalemia     SUBJECTIVE  No CP. No SOB. Main issue is hot flashes.  ROS: All other systems were reviewed and aside from mentioned above, everything else was negative.  CURRENT MEDS . amLODipine  5 mg Oral Daily  . lisinopril  5 mg Oral Daily  . metoprolol tartrate  50 mg Oral BID  . nicotine  14 mg Transdermal Daily  . nitroGLYCERIN  1 inch Topical Once  . sodium chloride flush  3 mL Intravenous Q12H    OBJECTIVE  Filed Vitals:   09/24/15 1221 09/24/15 2006 09/25/15 0422 09/25/15 0744  BP: 163/94 131/89 140/93 134/100  Pulse: 73 77 76   Temp: 97.7 F (36.5 C) 98.4 F (36.9 C) 97.5 F (36.4 C)   TempSrc: Oral Oral    Resp: 16 16 16    Height:      Weight:      SpO2: 100% 100% 100%     Intake/Output Summary (Last 24 hours) at 09/25/15 0942 Last data filed at 09/25/15 0916  Gross per 24 hour  Intake    480 ml  Output   1350 ml  Net   -870 ml   Filed Weights   09/23/15 0300 09/23/15 0754  Weight: 156 lb (70.761 kg) 154 lb 14.4 oz (70.262 kg)    PHYSICAL EXAM VS:  BP 134/100 mmHg  Pulse 76  Temp(Src) 97.5 F (36.4 C) (Oral)  Resp 16  Ht 5\' 5"  (1.651 m)  Wt 154 lb 14.4 oz (70.262 kg)  BMI 25.78 kg/m2  SpO2 100% , BMI Body mass index is 25.78 kg/(m^2). GENERAL:  well developed, well nourished, not in acute distress HEENT: normocephalic, pink conjunctivae, anicteric sclerae, no xanthelasma, normal dentition, oropharynx clear NECK:  no neck vein engorgement, JVP normal, no hepatojugular reflux, carotid upstroke brisk and symmetric, no bruit, no thyromegaly, no lymphadenopathy LUNGS:  good respiratory effort, clear to auscultation bilaterally CV:  PMI not displaced, no  thrills, no lifts, Slightly irregular, S2 within normal limits, no palpable S3 or S4, no murmurs, no rubs, no gallops ABD:  Soft, nontender, nondistended, normoactive bowel sounds, no abdominal aortic bruit, no hepatomegaly, no splenomegaly MS: nontender back, no kyphosis, no scoliosis, no joint deformities EXT:  2+ DP/PT pulses, no edema, no varicosities, no cyanosis, no clubbing SKIN: warm, nondiaphoretic, normal turgor, no ulcers NEUROPSYCH: alert, oriented to person, place, and time, sensory/motor grossly intact, normal mood, appropriate affect   Accessory Clinical Findings  CBC  Recent Labs  09/24/15 0254 09/25/15 0253  WBC 7.2 7.0  HGB 14.8 15.5  HCT 45.8 46.4  MCV 74.2* 73.4*  PLT 166 171   Basic Metabolic Panel  Recent Labs  09/23/15 0313 09/24/15 0254  NA 140 139  K 3.3* 4.1  CL 105 107  CO2 27 25  GLUCOSE 137* 99  BUN 20 15  CREATININE 0.87 0.65  CALCIUM 9.6 9.3  MG 2.0  --    Liver Function Tests No results for input(s): AST, ALT, ALKPHOS, BILITOT, PROT, ALBUMIN in the last 72 hours. No results for input(s): LIPASE, AMYLASE in the last 72 hours. Cardiac Enzymes  Recent Labs  09/23/15 1610 09/24/15 0254 09/24/15 1116  TROPONINI 0.04* <  0.03 <0.03   BNP (last 3 results) No results for input(s): BNP in the last 8760 hours. D-Dimer No results for input(s): DDIMER in the last 72 hours. Hemoglobin A1C No results for input(s): HGBA1C in the last 72 hours. Fasting Lipid Panel No results for input(s): CHOL, HDL, LDLCALC, TRIG, CHOLHDL, LDLDIRECT in the last 72 hours. Thyroid Function Tests  Recent Labs  09/23/15 1012 09/23/15 1610  TSH 21.960*  --   T3FREE  --  2.4    TELE  Atrial flutter with variable conduction, ventricular rates in the 60s to 70s  ECG  RADIOLOGY/STUDIES  Dg Chest 2 View  09/23/2015  CLINICAL DATA:  Epigastric chest pain and burning with radiation to both sides of the neck. Nausea, dizziness, lightheadedness. Smoker.  EXAM: CHEST  2 VIEW COMPARISON:  01/06/2014 FINDINGS: Mild hyperinflation consistent with emphysema. Normal heart size and pulmonary vascularity. No focal airspace disease or consolidation in the lungs. No blunting of costophrenic angles. No pneumothorax. Mediastinal contours appear intact. Calcified and tortuous aorta. Degenerative changes in the spine. IMPRESSION: No active cardiopulmonary disease. Electronically Signed   By: Burman Nieves M.D.   On: 09/23/2015 03:38   Ct Angio Chest Pe W Or Wo Contrast  09/24/2015  CLINICAL DATA:  Chest pain EXAM: CT ANGIOGRAPHY CHEST WITH CONTRAST TECHNIQUE: Multidetector CT imaging of the chest was performed using the standard protocol during bolus administration of intravenous contrast. Multiplanar CT image reconstructions and MIPs were obtained to evaluate the vascular anatomy. CONTRAST:  100 mL Isovue 370 nonionic COMPARISON:  Chest radiograph September 23, 2015 FINDINGS: Cardiovascular: There is no demonstrable pulmonary embolus. There is no thoracic aortic aneurysm. The contrast bolus does not sufficiently opacified the aorta to assess for possible dissection. There are scattered foci of atherosclerotic calcification in the aorta. The visualized great vessels appear unremarkable. There is evidence of left ventricular hypertrophy. The pericardium is not appreciably thickened. There is a small amount of coronary artery calcification noted. Mediastinum/Nodes: Thyroid appears unremarkable. There are scattered subcentimeter mediastinal lymph nodes. There is no adenopathy by size criteria on this study. Lungs/Pleura: There is mild scarring in the left base. There is mild bibasilar subsegmental atelectasis. There is no edema or consolidation. There is a degree of upper and lower lobe bronchiectatic change bilaterally. Upper Abdomen: There is reflux of contrast into the inferior vena cava. There is a 4 mm calculus in the upper pole of the left kidney. There is a small granuloma  in the spleen. Visualized upper abdominal structures otherwise appear unremarkable. Musculoskeletal: There is mild mid thoracic dextroscoliosis. There is mild degenerative change in the thoracic spine. No blastic or lytic bone lesions. Review of the MIP images confirms the above findings. IMPRESSION: No demonstrable pulmonary embolus. No thoracic aortic aneurysm. The contrast bolus timing does not allow for assessment for potential thoracic aortic dissection. If that entity is of potential clinical concern, would suggest thoracic MR to further assess. There is left ventricular hypertrophy. There is a small amount of coronary artery calcification evident. No demonstrable adenopathy. There is upper and lower lobe bronchiectatic change. No lung edema or consolidation. Reflux of contrast into the inferior vena cava potentially may indicate a degree of elevated right heart pressure. Nonobstructing 4 mm calculus upper pole left kidney. Electronically Signed   By: Bretta Bang III M.D.   On: 09/24/2015 14:46    ASSESSMENT AND PLAN atrial flutter with variable conduction ventricular rate controlled on just PO metoprolol CHADS2-VASc= 2. Continue with heparin. We'll likely switch  to Eliquis prior to discharge  Plan is to continue with rate control. Patient is not symptomatic anymore. Eventually, recommend outpatient evaluation by EP for atrial flutter ablation. Possible risk of significant bradycardia if we try to cardiovert.  Troponin, minimal elevation  Likely demand ischemia from tachycardia  Echo cardiogram revealed EF of 50-55%  Recommend pharmacologic nuclear stress today.patient complained of chest pain. This was likely related to the tachycardia. However, patient has risk factors for coronary artery disease.   Hypertension - continue to up titrate meds as needed. Continue metoprolol. Patient received amlodipine today. Will utilize ACE inhibitor Lisinopril 5 mg by mouth daily and uptitrate as  needed   Signed, Almond Lint, MD  09/25/2015, 9:42 AM  [facility]

## 2015-09-26 ENCOUNTER — Telehealth: Payer: Self-pay | Admitting: *Deleted

## 2015-09-26 NOTE — Telephone Encounter (Signed)
[facility].  Patient understands to follow up with provider Eula Listenyan Dunn PA on 10/09/15 at 3:00PM [facility]. Patient understands discharge instructions? Yes Patient understands medications and regiment? Yes Patient understands to bring all medications to this visit? Yes  Reviewed discharge instructions and medications with patient and she verbalized understanding with no further questions at this time. She did request a call the day before to remind her of the appointment.

## 2015-09-26 NOTE — Telephone Encounter (Signed)
Left voicemail message for patient to call back.

## 2015-09-26 NOTE — Telephone Encounter (Signed)
-----   Message from Traci L Tekely sent at 09/25/2015  3:45 PM EDT ----- Regarding: tcm/ph 10/09/15 R. Dunn 

## 2015-09-26 NOTE — Telephone Encounter (Signed)
-----   Message from Christell Faithraci L Tekely sent at 09/25/2015  3:45 PM EDT ----- Regarding: tcm/ph 10/09/15 R. Dunn

## 2015-09-29 NOTE — Telephone Encounter (Signed)
The automated system calls 5 days prior to the appointment and then we do a call 1 day before the appointment.

## 2015-10-03 ENCOUNTER — Encounter: Payer: Self-pay | Admitting: Physician Assistant

## 2015-10-03 DIAGNOSIS — E038 Other specified hypothyroidism: Secondary | ICD-10-CM | POA: Insufficient documentation

## 2015-10-03 DIAGNOSIS — E039 Hypothyroidism, unspecified: Secondary | ICD-10-CM | POA: Insufficient documentation

## 2015-10-07 NOTE — Progress Notes (Signed)
Cardiology Office Note Date:  10/09/2015  Patient ID:  Pamela, Maddox [birth year], MRN [id number] PCP:  No primary care provider on file.  Cardiologist:  Dr. Kirke Corin, MD    Chief Complaint: Hospital follow up for new onset Afib/flutter  History of Present Illness: [name] is a 65 y.o. female with history of recently diagnosed new onset Afib/flutter 09/2015, HTN, long standing tobacco abuse, self reported hyperthyroidism not on medication found to have subclinical hypothyroidism during admission 09/2015, and GERD who presents for hospital follow up of recent admission to [facility] from 7/11 to 7/13 for the above new onset Afib/flutter.   Prior to this admission she had never seen a cardiologist before. She presented to [facility] with complaints of reflux and associated palpitations. She was found to be in new onset Afib/flutter with RVR with heart rates in the 150's bpm. She was rate controlled and started on Eliquis for anticoagulation given her CHADS2VASc of 2 (HTN, female). She was also noted to have an elevated TSH at 21. Free T4, total T3, and free T3 were normal. 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 She was asymptomatic and advised to follow up with endocrinology for possible treatment after her acute illness has improved. Potassium was 3.3 upon admission which was repleted. Magnesium 2.0. Troponin peaked at 0.04. Echo showed and EF of 50-55%, no RWMA, mildly dilated left atrium, mildly dilated RV, RA was moderately to severely dilated, and a trivial pericardial effusion was noted. She underwent Lexiscan Myoview given her symptoms which showed an underlying rhythm of atrial flutter, no perfusion defects, no evidence of ischemia, low risk study, EF 47%. Her atrial flutter remained well rate-controlled. She was discharged on Eliquis with plans to see EP for atrial flutter ablation.   Since her admission, she has done well. She  had one episode of palpitations after rushing to get into [facility] in the heat. Palpitations last a couple of seconds before self resolving and she has not had any since. She has not missed any doses of her Eliquis or metoprolol. Heart rate remains well controlled at home. No LE swelling, orthopnea, early satiety, or abdominal distension. She does not have any concerns today.    Past Medical History:  Diagnosis Date  . Atrial flutter (HCC) 09/2015   a. CHADS2VASc 2 (HTN, female); b. on Eliquis; c. echo 7/17: EF 50-55%, no RWMA, rhythm atrial flutter, mildly dilated LA/RV, moderately to severely dilated RA, trivial pericardial effusion  . Essential hypertension   . GERD (gastroesophageal reflux disease)   . Subclinical hypothyroidism   . Tobacco abuse     Past Surgical History:  Procedure Laterality Date  . ABDOMINAL HYSTERECTOMY      Current Outpatient Prescriptions  Medication Sig Dispense Refill  . apixaban (ELIQUIS) 5 MG TABS tablet Take 1 tablet (5 mg total) by mouth 2 (two) times daily. 60 tablet 2  . lisinopril (PRINIVIL,ZESTRIL) 20 MG tablet Take 1 tablet (20 mg total) by mouth daily. 30 tablet 2  . metoprolol (LOPRESSOR) 50 MG tablet Take 1 tablet (50 mg total) by mouth 2 (two) times daily. 60 tablet 2   No current facility-administered medications for this visit.     Allergies:   Amoxicillin   Social History:  The patient  reports that she has been smoking.  She has never used smokeless tobacco. She reports that she drinks alcohol. She reports that she does not use drugs.  Family History:  The patient's family history includes Diabetes in her father and mother; Hypertension in her mother.  ROS:   Review of Systems  Constitutional: Negative for chills, diaphoresis, fever, malaise/fatigue and weight loss.  HENT: Negative for congestion.   Eyes: Negative for discharge and redness.  Respiratory: Negative for cough, hemoptysis, sputum production, shortness of breath and  wheezing.   Cardiovascular: Positive for palpitations. Negative for chest pain, orthopnea, claudication, leg swelling and PND.  Gastrointestinal: Negative for abdominal pain, blood in stool, heartburn, melena, nausea and vomiting.  Genitourinary: Negative for hematuria.  Musculoskeletal: Negative for falls and myalgias.  Skin: Negative for rash.  Neurological: Negative for dizziness, tingling, tremors, sensory change, speech change, focal weakness, loss of consciousness and weakness.  Endo/Heme/Allergies: Does not bruise/bleed easily.  Psychiatric/Behavioral: Negative for substance abuse. The patient is not nervous/anxious.   All other systems reviewed and are negative.    PHYSICAL EXAM:  VS:  BP 110/70 (BP Location: Left Arm, Patient Position: Sitting, Cuff Size: Normal)   Pulse 92   Ht 5\' 5"  (1.651 m)   Wt 156 lb 8 oz (71 kg)   BMI 26.04 kg/m  BMI: Body mass index is 26.04 kg/m.  Physical Exam  Constitutional: She is oriented to person, place, and time. She appears well-developed and well-nourished.  HENT:  Head: Normocephalic and atraumatic.  Eyes: Right eye exhibits no discharge. Left eye exhibits no discharge.  Neck: Normal range of motion. No JVD present.  Cardiovascular: Normal rate, S1 normal, S2 normal and normal heart sounds.  An irregular rhythm present. Exam reveals no distant heart sounds, no friction rub, no midsystolic click and no opening snap.   No murmur heard. Pulmonary/Chest: Effort normal and breath sounds normal. No respiratory distress. She has no decreased breath sounds. She has no wheezes. She has no rales. She exhibits no tenderness.  Abdominal: Soft. She exhibits no distension. There is no tenderness.  Musculoskeletal: She exhibits no edema.  Neurological: She is alert and oriented to person, place, and time.  Skin: Skin is warm and dry. No cyanosis. Nails show no clubbing.  Psychiatric: She has a normal mood and affect. Her speech is normal and behavior  is normal. Judgment and thought content normal.      EKG:  Was ordered and interpreted by me today. Shows typical atrial flutter with variable AV block, 92 bpm, LVH, inferolateral TWI (old)  Recent Labs: 09/23/2015: Magnesium 2.0; TSH 21.960 09/24/2015: BUN 15; Creatinine, Ser 0.65; Potassium 4.1; Sodium 139 09/25/2015: Hemoglobin 15.5; Platelets 171  No results found for requested labs within last 8760 hours.   Estimated Creatinine Clearance: 71.1 mL/min (by C-G formula based on SCr of 0.8 mg/dL).   Wt Readings from Last 3 Encounters:  10/09/15 156 lb 8 oz (71 kg)  09/23/15 154 lb 14.4 oz (70.3 kg)  08/16/15 155 lb (70.3 kg)     Other studies reviewed: Additional studies/records reviewed today include: summarized above  ASSESSMENT AND PLAN:  1. Afib/flutter: She remains in rate controlled atrial flutter with variable AV block, 92 bpm. She has not missed any doses of Eliquis or metoprolol. She is tolerating the rhythm well and has only had one episode of palpitations since her discharge. Continue Eliquis and metoprolol. Check bmet and cbc. Schedule her to see EP for evaluation of atrial flutter ablation. CHADS2VASc 2 (HTN, female).   2. Subclinical hypothyroidism: Followed by endocrinology. Has planned recheck of thyroid function with endocrinology.   3. Hypokalemia: Resolved at hospital discharge.  Check bmet as above.   4. HTN: Well controlled. Continue current antihypertensives.   5. Longstanding tobacco abuse: Cessation advised.   Disposition: F/u with myself or Dr. Kirke Corin, MD in approximately 6 weeks (after atrial flutter ablation).   Current medicines are reviewed at length with the patient today.  The patient did not have any concerns regarding medicines.  Pamela Dodge PA-C 10/09/2015 3:36 PM     [facility] [place] [address] [place], [place] [address] [phone]

## 2015-10-09 ENCOUNTER — Ambulatory Visit (INDEPENDENT_AMBULATORY_CARE_PROVIDER_SITE_OTHER): Payer: Self-pay | Admitting: Physician Assistant

## 2015-10-09 ENCOUNTER — Encounter: Payer: Self-pay | Admitting: Physician Assistant

## 2015-10-09 VITALS — BP 110/70 | HR 92 | Ht 65.0 in | Wt 156.5 lb

## 2015-10-09 DIAGNOSIS — E039 Hypothyroidism, unspecified: Secondary | ICD-10-CM

## 2015-10-09 DIAGNOSIS — E876 Hypokalemia: Secondary | ICD-10-CM

## 2015-10-09 DIAGNOSIS — E038 Other specified hypothyroidism: Secondary | ICD-10-CM

## 2015-10-09 DIAGNOSIS — I483 Typical atrial flutter: Secondary | ICD-10-CM

## 2015-10-09 DIAGNOSIS — I10 Essential (primary) hypertension: Secondary | ICD-10-CM

## 2015-10-09 DIAGNOSIS — Z720 Tobacco use: Secondary | ICD-10-CM

## 2015-10-09 NOTE — Patient Instructions (Addendum)
Medication Instructions:  Your physician recommends that you continue on your current medications as directed. Please refer to the Current Medication list given to you today.   Labwork: BMET, CBC  Testing/Procedures: none  Follow-Up: Your physician recommends that you schedule a follow-up appointment in: 6 weeks with Eula Listen, PA-C or Dr. Alvino Chapel (after ablation)   Any Other Special Instructions Will Be Listed Below (If Applicable). Referral to Dr. Graciela Husbands, Electrophysiology for possible flutter ablation     If you need a refill on your cardiac medications before your next appointment, please call your pharmacy.

## 2015-10-10 LAB — BASIC METABOLIC PANEL
BUN / CREAT RATIO: 16 (ref 12–28)
BUN: 14 mg/dL (ref 8–27)
CHLORIDE: 102 mmol/L (ref 96–106)
CO2: 23 mmol/L (ref 18–29)
Calcium: 9.7 mg/dL (ref 8.7–10.3)
Creatinine, Ser: 0.85 mg/dL (ref 0.57–1.00)
GFR calc non Af Amer: 73 mL/min/{1.73_m2} (ref >59)
GFR, EST AFRICAN AMERICAN: 84 mL/min/{1.73_m2} (ref >59)
GLUCOSE: 143 mg/dL — AB (ref 65–99)
POTASSIUM: 3.8 mmol/L (ref 3.5–5.2)
SODIUM: 142 mmol/L (ref 134–144)

## 2015-10-10 LAB — CBC
HEMATOCRIT: 40 % (ref 34.0–46.6)
Hemoglobin: 12.7 g/dL (ref 11.1–15.9)
MCH: 23.7 pg — ABNORMAL LOW (ref 26.6–33.0)
MCHC: 31.8 g/dL (ref 31.5–35.7)
MCV: 75 fL — ABNORMAL LOW (ref 79–97)
PLATELETS: 193 10*3/uL (ref 150–379)
RBC: 5.35 x10E6/uL — ABNORMAL HIGH (ref 3.77–5.28)
RDW: 15.7 % — ABNORMAL HIGH (ref 12.3–15.4)
WBC: 6.9 10*3/uL (ref 3.4–10.8)

## 2015-10-20 ENCOUNTER — Ambulatory Visit: Payer: Self-pay

## 2015-10-21 ENCOUNTER — Ambulatory Visit: Payer: Self-pay

## 2015-10-29 ENCOUNTER — Telehealth: Payer: Self-pay | Admitting: Cardiovascular Disease

## 2015-10-29 NOTE — Telephone Encounter (Signed)
[facility] application in MD basket.

## 2015-11-01 ENCOUNTER — Encounter: Payer: Self-pay | Admitting: Emergency Medicine

## 2015-11-01 ENCOUNTER — Emergency Department
Admission: EM | Admit: 2015-11-01 | Discharge: 2015-11-01 | Disposition: A | Discharge status: Home / Self Care | Payer: Medicaid Other | Attending: Emergency Medicine | Admitting: Emergency Medicine

## 2015-11-01 DIAGNOSIS — F17200 Nicotine dependence, unspecified, uncomplicated: Secondary | ICD-10-CM | POA: Insufficient documentation

## 2015-11-01 DIAGNOSIS — I10 Essential (primary) hypertension: Secondary | ICD-10-CM | POA: Diagnosis not present

## 2015-11-01 DIAGNOSIS — R42 Dizziness and giddiness: Secondary | ICD-10-CM | POA: Insufficient documentation

## 2015-11-01 DIAGNOSIS — Z79899 Other long term (current) drug therapy: Secondary | ICD-10-CM | POA: Diagnosis not present

## 2015-11-01 DIAGNOSIS — E038 Other specified hypothyroidism: Secondary | ICD-10-CM | POA: Diagnosis not present

## 2015-11-01 LAB — CBC WITH DIFFERENTIAL/PLATELET
BASOS PCT: 0 %
Basophils Absolute: 0 10*3/uL (ref 0–0.1)
EOS ABS: 0 10*3/uL (ref 0–0.7)
EOS PCT: 0 %
HCT: 42.5 % (ref 35.0–47.0)
HEMOGLOBIN: 13.9 g/dL (ref 12.0–16.0)
Lymphocytes Relative: 9 %
Lymphs Abs: 0.7 10*3/uL — ABNORMAL LOW (ref 1.0–3.6)
MCH: 24.2 pg — AB (ref 26.0–34.0)
MCHC: 32.7 g/dL (ref 32.0–36.0)
MCV: 74 fL — ABNORMAL LOW (ref 80.0–100.0)
MONOS PCT: 4 %
Monocytes Absolute: 0.3 10*3/uL (ref 0.2–0.9)
NEUTROS PCT: 87 %
Neutro Abs: 7 10*3/uL — ABNORMAL HIGH (ref 1.4–6.5)
PLATELETS: 163 10*3/uL (ref 150–440)
RBC: 5.74 MIL/uL — AB (ref 3.80–5.20)
RDW: 15.1 % — ABNORMAL HIGH (ref 11.5–14.5)
WBC: 8.1 10*3/uL (ref 3.6–11.0)

## 2015-11-01 LAB — BASIC METABOLIC PANEL
Anion gap: 6 (ref 5–15)
BUN: 16 mg/dL (ref 6–20)
CALCIUM: 9.4 mg/dL (ref 8.9–10.3)
CHLORIDE: 110 mmol/L (ref 101–111)
CO2: 27 mmol/L (ref 22–32)
CREATININE: 0.77 mg/dL (ref 0.44–1.00)
Glucose, Bld: 98 mg/dL (ref 65–99)
Potassium: 3.7 mmol/L (ref 3.5–5.1)
SODIUM: 143 mmol/L (ref 135–145)

## 2015-11-01 LAB — TROPONIN I

## 2015-11-01 MED ORDER — SODIUM CHLORIDE 0.9 % IV BOLUS (SEPSIS)
1000.0000 mL | Freq: Once | INTRAVENOUS | Status: AC
  Administered 2015-11-01: 1000 mL via INTRAVENOUS

## 2015-11-01 MED ORDER — SODIUM CHLORIDE 0.9 % IV SOLN
Freq: Once | INTRAVENOUS | Status: AC
  Administered 2015-11-01: 18:00:00 via INTRAVENOUS

## 2015-11-01 NOTE — Discharge Instructions (Signed)
Please seek medical attention for any high fevers, chest pain, shortness of breath, change in behavior, persistent vomiting, bloody stool or any other new or concerning symptoms.  

## 2015-11-01 NOTE — ED Triage Notes (Signed)
Pt states she was cooking this morning and states she hasn't drank or eaten anything today as she has been busy.  Pt states she was puling something out of the oven when she stood back up she noticed some dizziness. Pt states she was cooking in the kitchen where there is no air and felt hot.  Pt states she did not take her regularly scheduled medications until around 1300 today.  Pt denies any signs of feeling of flutter or chest pain but does c/o some burning.  Pt is not in any respiratory distress. A/O x 4.

## 2015-11-01 NOTE — ED Provider Notes (Signed)
[facility] Emergency Department Provider Note  ____________________________________________   I have reviewed the triage vital signs and the nursing notes.   HISTORY  Chief Complaint Dizziness   History limited by: Not Limited   HPI [name] is a 65 y.o. female who presents to the emergency department today after a couple episodes of dizziness and feeling like her heart Flutter. Patient States That the First Episode Happened Roughly 4-1/2 Hours Prior to My Evaluation. She Was Cooking at That Time.She got the feeling of being slightly lightheaded. This was accompanied by the feeling of fluttering in her chest. She denies any associated chest pain. It stopped on its own. Since the initial episode she had 3 more episodes. She did not take her medications this morning. She was recently admitted for new onset A. fib her. She forgot to take them this morning however then took him about an hour and a half ago after the episode started happening. Patient denies any recent illnesses. Denies any fevers.    Past Medical History:  Diagnosis Date  . Atrial flutter (HCC) 09/2015   a. CHADS2VASc 2 (HTN, female); b. on Eliquis; c. echo 7/17: EF 50-55%, no RWMA, rhythm atrial flutter, mildly dilated LA/RV, moderately to severely dilated RA, trivial pericardial effusion  . Essential hypertension   . GERD (gastroesophageal reflux disease)   . Subclinical hypothyroidism   . Tobacco abuse     Patient Active Problem List   Diagnosis Date Noted  . Subclinical hypothyroidism   . Atrial flutter (HCC) 09/24/2015  . Essential hypertension 09/23/2015  . Tobacco abuse 09/23/2015    Past Surgical History:  Procedure Laterality Date  . ABDOMINAL HYSTERECTOMY      Prior to Admission medications   Medication Sig Start Date End Date Taking? Authorizing Provider  apixaban (ELIQUIS) 5 MG TABS tablet Take 1 tablet (5 mg total) by mouth 2 (two) times daily. 09/25/15   Enid Baasadhika  Kalisetti, MD  lisinopril (PRINIVIL,ZESTRIL) 20 MG tablet Take 1 tablet (20 mg total) by mouth daily. 09/25/15   Enid Baasadhika Kalisetti, MD  metoprolol (LOPRESSOR) 50 MG tablet Take 1 tablet (50 mg total) by mouth 2 (two) times daily. 09/25/15   Enid Baasadhika Kalisetti, MD    Allergies Amoxicillin  Family History  Problem Relation Age of Onset  . Hypertension Mother   . Diabetes Mother   . Diabetes Father     Social History Social History  Substance Use Topics  . Smoking status: Current Some Day Smoker  . Smokeless tobacco: Never Used  . Alcohol use Yes     Comment: occasionally    Review of Systems  Constitutional: Negative for fever. Cardiovascular: Negative for chest pain. Respiratory: Negative for shortness of breath. Gastrointestinal: Negative for abdominal pain, vomiting and diarrhea. Neurological: Negative for headaches, focal weakness or numbness.   10-point ROS otherwise negative.  ____________________________________________   PHYSICAL EXAM:  VITAL SIGNS: ED Triage Vitals  Enc Vitals Group     BP 134/94     Pulse 114     Resp 18     Temp      Temp src      SpO2 100   Constitutional: Alert and oriented. Well appearing and in no distress. Eyes: Conjunctivae are normal. PERRL. Normal extraocular movements. ENT   Head: Normocephalic and atraumatic.   Nose: No congestion/rhinnorhea.   Mouth/Throat: Mucous membranes are moist.   Neck: No stridor. Hematological/Lymphatic/Immunilogical: No cervical lymphadenopathy. Cardiovascular:Tachycardic, regular rhythm.  No murmurs, rubs, or gallops. Respiratory:  Normal respiratory effort without tachypnea nor retractions. Breath sounds are clear and equal bilaterally. No wheezes/rales/rhonchi. Gastrointestinal: Soft and nontender. No distention.  Genitourinary: Deferred Musculoskeletal: Normal range of motion in all extremities. No joint effusions.  No lower extremity tenderness nor edema. Neurologic:  Normal  speech and language. No gross focal neurologic deficits are appreciated.  Skin:  Skin is warm, dry and intact. No rash noted. Psychiatric: Mood and affect are normal. Speech and behavior are normal. Patient exhibits appropriate insight and judgment.  ____________________________________________    LABS (pertinent positives/negatives)  Labs Reviewed  CBC WITH DIFFERENTIAL/PLATELET - Abnormal; Notable for the following:       Result Value   RBC 5.74 (*)    MCV 74.0 (*)    MCH 24.2 (*)    RDW 15.1 (*)    Neutro Abs 7.0 (*)    Lymphs Abs 0.7 (*)    All other components within normal limits  BASIC METABOLIC PANEL  TROPONIN I  TROPONIN I     ____________________________________________   EKG  I, Phineas SemenGraydon Iysis Germain, attending physician, personally viewed and interpreted this EKG  EKG Time: 1617 Rate: 114 Rhythm: sinus tachycardia Axis: normal Intervals: qtc 425 QRS: narrow, LVH, q waves V1 ST changes: no st elevation Impression: abnormal ekg   ____________________________________________    RADIOLOGY  None  ____________________________________________   PROCEDURES  Procedures  ____________________________________________   INITIAL IMPRESSION / ASSESSMENT AND PLAN / ED COURSE  Pertinent labs & imaging results that were available during my care of the patient were reviewed by me and considered in my medical decision making (see chart for details).  Patient presents to the emergency department today because of concern for episodes of lightheadedness and palpitations. Did not take her beta blocker this morning as she normally does. Has since taken it. No longer feeling any palpitations. She is somewhat tachycardic. Will check blood work and give fluids.  Clinical Course   Patients heart rate did improve after fluids. Blood work, including 2 sets of troponin negative. Think that the episodes this afternoon could be related to the patient missing her beta blocker  this morning. Discussed return precautions. ____________________________________________   FINAL CLINICAL IMPRESSION(S) / ED DIAGNOSES  Final diagnoses:  Dizziness     Note: This dictation was prepared with Dragon dictation. Any transcriptional errors that result from this process are unintentional    Phineas SemenGraydon Jeremaine Maraj, MD 11/01/15 1935

## 2015-11-06 ENCOUNTER — Ambulatory Visit
Admission: RE | Admit: 2015-11-06 | Discharge: 2015-11-06 | Disposition: A | Discharge status: Home / Self Care | Payer: Medicaid Other | Source: Ambulatory Visit | Attending: Internal Medicine | Admitting: Internal Medicine

## 2015-11-06 ENCOUNTER — Ambulatory Visit (INDEPENDENT_AMBULATORY_CARE_PROVIDER_SITE_OTHER): Payer: Self-pay | Admitting: Internal Medicine

## 2015-11-06 ENCOUNTER — Encounter: Payer: Self-pay | Admitting: Internal Medicine

## 2015-11-06 VITALS — BP 160/102 | HR 86 | Ht 65.0 in | Wt 158.8 lb

## 2015-11-06 DIAGNOSIS — M79652 Pain in left thigh: Secondary | ICD-10-CM | POA: Insufficient documentation

## 2015-11-06 DIAGNOSIS — M1612 Unilateral primary osteoarthritis, left hip: Secondary | ICD-10-CM | POA: Diagnosis not present

## 2015-11-06 DIAGNOSIS — I483 Typical atrial flutter: Secondary | ICD-10-CM

## 2015-11-06 MED ORDER — METOPROLOL TARTRATE 100 MG PO TABS: ORAL_TABLET | ORAL | Status: DC

## 2015-11-06 MED ORDER — APIXABAN 5 MG PO TABS: 5.0000 mg | ORAL_TABLET | Freq: Two times a day (BID) | ORAL | 2 refills | Status: DC

## 2015-11-06 MED ORDER — APIXABAN 5 MG PO TABS: 5.0000 mg | ORAL_TABLET | Freq: Two times a day (BID) | ORAL | 3 refills | Status: DC

## 2015-11-06 NOTE — Patient Instructions (Addendum)
Medication Instructions: - Your physician has recommended you make the following change in your medication:  1) Increase metoprolol tartrate to 100 mg one tablet by mouth twice daily  Labwork: - none today  Procedures/Testing: - Non-Cardiac CT scanning (of your left leg/thigh), (CAT scanning), is a noninvasive, special x-ray that produces cross-sectional images of the body using x-rays and a computer. CT scans help physicians diagnose and treat medical conditions. For some CT exams, a contrast material is used to enhance visibility in the area of the body being studied. CT scans provide greater clarity and reveal more details than regular x-ray exams.  ** Please proceed to the Medical Mall entrance at [facility] - 1st desk on the right to register- they are expecting you now.  Follow-Up: - pending results of your CT scan we will either schedule you for a Cardioversion or an Ablation- we will notify you of results of the CT as soon as we know and discuss the next steps with you  Any Additional Special Instructions Will Be Listed Below (If Applicable).     If you need a refill on your cardiac medications before your next appointment, please call your pharmacy.

## 2015-11-06 NOTE — Progress Notes (Signed)
ELECTROPHYSIOLOGY CONSULT NOTE  Patient ID: [name], MRN: [id number], DOB/AGE: [birth year] 65 y.o. Admit date: (Not on file) Date of Consult: 11/06/2015  [facility] Primary Cardiologist: MA/RD Consulting Physician MA/RD  Chief Complaint: Atrial flutter   HPI [name] is a 65 y.o. female  Referred for consideration of treatment options of atrial flutter.  She presented to the hospital 7/17 with palpitations and chest discomfort and polyuria and was found to be in atrial flutter with 2-1 conduction At a ventricular rate of 105.  Eval 7/17  Echo  LVH 15/13 LAE  (46/2.5/42) 7/17  Myoview  EF 47% no ischemia  Discharged on apixoban and metoprolol for rate contol.  She was seen in the emergency room last weekend. No ECG is available. EDP describes "sinus tachycardia heart rate of 110". I suspect it was atrial flutter with 2-1 conduction.  Her palpitations have been much less problematic since discharge apart from that transient episode last weekend.  She has a history of elevated blood pressure/hypertension. We don't have records confirming this.  Family history is notable for sudden death in her older brother in his 3060s.  TSH 21 but Free T3 and T4 normal  Past Medical History:  Diagnosis Date  . Atrial flutter (HCC) 09/2015   a. CHADS2VASc 2 (HTN, female); b. on Eliquis; c. echo 7/17: EF 50-55%, no RWMA, rhythm atrial flutter, mildly dilated LA/RV, moderately to severely dilated RA, trivial pericardial effusion  . Essential hypertension   . GERD (gastroesophageal reflux disease)   . Subclinical hypothyroidism   . Tobacco abuse       Surgical History:  Past Surgical History:  Procedure Laterality Date  . ABDOMINAL HYSTERECTOMY       Home Meds: Prior to Admission medications   Medication Sig Start Date End Date Taking? Authorizing Provider  apixaban (ELIQUIS) 5 MG TABS tablet Take 1 tablet (5 mg total) by mouth 2 (two)  times daily. 09/25/15  Yes Enid Baasadhika Kalisetti, MD  levothyroxine (SYNTHROID, LEVOTHROID) 88 MCG tablet Take 88 mcg by mouth daily before breakfast.  10/24/15 10/23/16 Yes Historical Provider, MD  lisinopril (PRINIVIL,ZESTRIL) 20 MG tablet Take 1 tablet (20 mg total) by mouth daily. 09/25/15  Yes Enid Baasadhika Kalisetti, MD  metoprolol (LOPRESSOR) 50 MG tablet Take 1 tablet (50 mg total) by mouth 2 (two) times daily. 09/25/15  Yes Enid Baasadhika Kalisetti, MD    Allergies:  Allergies  Allergen Reactions  . Amoxicillin Itching    Social History   Social History  . Marital status: Widowed    Spouse name: N/A  . Number of children: N/A  . Years of education: N/A   Occupational History  . Not on file.   Social History Main Topics  . Smoking status: Current Some Day Smoker  . Smokeless tobacco: Never Used  . Alcohol use Yes     Comment: occasionally  . Drug use: No  . Sexual activity: Yes    Birth control/ protection: Surgical   Other Topics Concern  . Not on file   Social History Narrative  . No narrative on file     Family History  Problem Relation Age of Onset  . Hypertension Mother   . Diabetes Mother   . Diabetes Father      ROS:  Please see the history of present illness.     All other systems reviewed and negative.    Physical Exam: Blood pressure (!) 160/102, pulse 86, height 5'  5" (1.651 m), weight 158 lb 12 oz (72 kg). General: Well developed, well nourished female in no acute distress. Head: Normocephalic, atraumatic, sclera non-icteric, no xanthomas, nares are without discharge. EENT: normal  Lymph Nodes:  none Neck: Negative for carotid bruits. JVD not elevated. Back:without scoliosis kyphosis  Lungs: Clear bilaterally to auscultation without wheezes, rales, or rhonchi. Breathing is unlabored. Heart: RRR with S1 S2. 2/6 systolic  murmur . No rubs, or gallops appreciated. Abdomen: Soft, non-tender, non-distended with normoactive bowel sounds. No hepatomegaly. No  rebound/guarding. No obvious abdominal masses. Msk:  Strength and tone appear normal for age. Extremities: No clubbing or cyanosis. No  edema.  Distal pedal pulses are 2+ and equal bilaterally.  wtihtout swelling or discoloration although significant tenderness Skin: Warm and Dry Neuro: Alert and oriented X 3. CN III-XII intact Grossly normal sensory and motor function . Psych:  Responds to questions appropriately with a normal affect.      Labs: Cardiac Enzymes No results for input(s): CKTOTAL, CKMB, TROPONINI in the last 72 hours. CBC Lab Results  Component Value Date   WBC 8.1 11/01/2015   HGB 13.9 11/01/2015   HCT 42.5 11/01/2015   MCV 74.0 (L) 11/01/2015   PLT 163 11/01/2015   PROTIME: No results for input(s): LABPROT, INR in the last 72 hours. Chemistry  Recent Labs Lab 11/01/15 1634  NA 143  K 3.7  CL 110  CO2 27  BUN 16  CREATININE 0.77  CALCIUM 9.4  GLUCOSE 98   Lipids No results found for: CHOL, HDL, LDLCALC, TRIG BNP No results found for: PROBNP Thyroid Function Tests: No results for input(s): TSH, T4TOTAL, T3FREE, THYROIDAB in the last 72 hours.  Invalid input(s): FREET3 Miscellaneous No results found for: DDIMER  Radiology/Studies:  No results found.  EKG: Atrial flutter 2:1 typical   Assessment and Plan:   Atrial flutter- typical  Hypertension  Anticoagulation  Left leg pain without obvious swelling  From an arrhythmia point of view, catheter ablation is the appropriate next step for her atrial flutter. This would hopefully obviate the need for long-term anticoagulation. She has been therapeutically anticoagulated so proceeding towards ablation either directly or by way of cardioversion would be appropriate unless the pain in her leg is coming from bleeding in her leg. I suspect this is relatively likely not withstanding absence of swelling given the onset of pain after the initiation of anticoagulation.  We'll undertake CT scanning. If  confirmed we'll need to hold anticoagulation and proceed on the aforementioned course following our ability to reinitiate.  We have reviewed the risks and benefits of cardioversion as well as catheter ablation. She is agreeable to proceeding.  In the interim, we will double metoprolol for rate control and hypertension   We will need to make sure she maintains her apixoban--samples givne    Sherryl MangesSteven Taray Normoyle Symptoms light

## 2015-11-10 ENCOUNTER — Other Ambulatory Visit: Payer: Self-pay | Admitting: Cardiovascular Disease

## 2015-11-10 ENCOUNTER — Telehealth: Payer: Self-pay | Admitting: Cardiovascular Disease

## 2015-11-10 NOTE — Telephone Encounter (Signed)
Received staff message from Dr. Odessa FlemingKlein's nurse, Herbert SetaHeather, on Friday (after hours) that pt needs DCCV this week.  Dr. Mariah MillingGollan is agreeable to tomorrow @ 7:30. Spoke w/ pt and she is agreeable & appreciative of the quick response. Pt is sched for DCCV tomorrow am, [facility]. Reviewed pre-procedure instructions.  Pt verbalizes understanding.  Pt had BMET & CBC drawn on 11/01/15. Asked her to call back if she has any questions or concerns.

## 2015-11-11 ENCOUNTER — Ambulatory Visit
Admission: RE | Admit: 2015-11-11 | Discharge: 2015-11-11 | Disposition: A | Discharge status: Home / Self Care | Payer: Medicaid Other | Source: Ambulatory Visit | Attending: Cardiovascular Disease | Admitting: Cardiovascular Disease

## 2015-11-11 ENCOUNTER — Telehealth: Payer: Self-pay | Admitting: Cardiology

## 2015-11-11 ENCOUNTER — Encounter
Admission: RE | Disposition: A | Discharge status: Home / Self Care | Payer: Self-pay | Source: Ambulatory Visit | Attending: Cardiovascular Disease

## 2015-11-11 ENCOUNTER — Ambulatory Visit: Payer: Medicaid Other | Admitting: Anesthesiology

## 2015-11-11 DIAGNOSIS — E039 Hypothyroidism, unspecified: Secondary | ICD-10-CM | POA: Diagnosis not present

## 2015-11-11 DIAGNOSIS — K219 Gastro-esophageal reflux disease without esophagitis: Secondary | ICD-10-CM | POA: Diagnosis not present

## 2015-11-11 DIAGNOSIS — I4891 Unspecified atrial fibrillation: Secondary | ICD-10-CM | POA: Diagnosis not present

## 2015-11-11 DIAGNOSIS — I10 Essential (primary) hypertension: Secondary | ICD-10-CM | POA: Insufficient documentation

## 2015-11-11 DIAGNOSIS — I483 Typical atrial flutter: Secondary | ICD-10-CM

## 2015-11-11 DIAGNOSIS — R0602 Shortness of breath: Secondary | ICD-10-CM

## 2015-11-11 HISTORY — PX: ELECTROPHYSIOLOGIC STUDY: SHX172A

## 2015-11-11 SURGERY — CARDIOVERSION (CATH LAB)
Anesthesia: General

## 2015-11-11 MED ORDER — ONDANSETRON HCL 4 MG/2ML IJ SOLN
INTRAMUSCULAR | Status: AC
  Administered 2015-11-11: 4 mg via INTRAVENOUS
  Filled 2015-11-11: qty 2

## 2015-11-11 MED ORDER — PROPOFOL 10 MG/ML IV BOLUS
INTRAVENOUS | Status: DC | PRN
  Administered 2015-11-11 (×2): 50 mg via INTRAVENOUS

## 2015-11-11 MED ORDER — SODIUM CHLORIDE 0.9 % IV SOLN
INTRAVENOUS | Status: DC | PRN
  Administered 2015-11-11: 08:00:00 via INTRAVENOUS

## 2015-11-11 MED ORDER — ATROPINE SULFATE 1 MG/10ML IJ SOSY
PREFILLED_SYRINGE | INTRAMUSCULAR | Status: AC
  Filled 2015-11-11: qty 10

## 2015-11-11 MED ORDER — ONDANSETRON HCL 4 MG/2ML IJ SOLN
4.0000 mg | Freq: Once | INTRAMUSCULAR | Status: AC
Start: 2015-11-11 — End: 2015-11-11
  Administered 2015-11-11: 4 mg via INTRAVENOUS

## 2015-11-11 NOTE — CV Procedure (Signed)
Cardioversion procedure note For atrial flutter, typical  Procedure Details:  Consent: Risks of procedure as well as the alternatives and risks of each were explained to the (patient/caregiver). Consent for procedure obtained.  Time Out: Verified patient identification, verified procedure, site/side was marked, verified correct patient position, special equipment/implants available, medications/allergies/relevent history reviewed, required imaging and test results available. Performed  Patient placed on cardiac monitor, pulse oximetry, supplemental oxygen as necessary.  Sedation given: propofol IV, Dr. Morley KosGephart Pacer pads placed anterior and posterior chest.   Cardioverted 1 time(s).  Cardioverted at  150J. Synchronized biphasic Converted to NSR   Evaluation: Findings: Post procedure EKG shows: NSR Complications: None Patient did tolerate procedure well. Post procedure bradycardia while still sedated, rates in the 40s  Time Spent Directly with the Patient:  45 minutes   Dossie Arbourim Welda Azzarello, M.D., Ph.D.

## 2015-11-11 NOTE — Telephone Encounter (Signed)
Pt daughter calling stating she had a test this morning And she is having a reaction from the patch they put on her Would like some advise on this  Pt states its more of a rash  Please call back

## 2015-11-11 NOTE — Anesthesia Preprocedure Evaluation (Signed)
Anesthesia Evaluation  Patient identified by MRN, date of birth, ID band Patient awake    Reviewed: Allergy & Precautions, H&P , NPO status , Patient's Chart, lab work & pertinent test results, reviewed documented beta blocker date and time   Airway Mallampati: II   Neck ROM: full    Dental  (+) Poor Dentition   Pulmonary neg pulmonary ROS, Current Smoker,    Pulmonary exam normal        Cardiovascular hypertension, negative cardio ROS Normal cardiovascular examAtrial Fibrillation  Rhythm:irregular Rate:Normal     Neuro/Psych negative neurological ROS  negative psych ROS   GI/Hepatic negative GI ROS, Neg liver ROS, GERD  Medicated,  Endo/Other  negative endocrine ROSHypothyroidism   Renal/GU negative Renal ROS  negative genitourinary   Musculoskeletal   Abdominal   Peds  Hematology negative hematology ROS (+)   Anesthesia Other Findings Past Medical History: 09/2015: Atrial flutter (HCC)     Comment: a. CHADS2VASc 2 (HTN, female); b. on Eliquis;               c. echo 7/17: EF 50-55%, no RWMA, rhythm atrial              flutter, mildly dilated LA/RV, moderately to               severely dilated RA, trivial pericardial               effusion No date: Essential hypertension No date: GERD (gastroesophageal reflux disease) No date: Subclinical hypothyroidism No date: Tobacco abuse Past Surgical History: No date: ABDOMINAL HYSTERECTOMY BMI    Body Mass Index:  26.29 kg/m     Reproductive/Obstetrics                             Anesthesia Physical Anesthesia Plan  ASA: III  Anesthesia Plan: General   Post-op Pain Management:    Induction:   Airway Management Planned:   Additional Equipment:   Intra-op Plan:   Post-operative Plan:   Informed Consent: I have reviewed the patients History and Physical, chart, labs and discussed the procedure including the risks, benefits and  alternatives for the proposed anesthesia with the patient or authorized representative who has indicated his/her understanding and acceptance.   Dental Advisory Given  Plan Discussed with: CRNA  Anesthesia Plan Comments:         Anesthesia Quick Evaluation

## 2015-11-11 NOTE — Transfer of Care (Signed)
Immediate Anesthesia Transfer of Care Note  Patient: [name]  Procedure(s) Performed: Procedure(s): CARDIOVERSION (N/A)  Patient Location: spu  Anesthesia Type:General  Level of Consciousness: awake and alert   Airway & Oxygen Therapy: Patient Spontanous Breathing and Patient connected to nasal cannula oxygen  Post-op Assessment: Report given to RN and Post -op Vital signs reviewed and stable  Post vital signs: Reviewed  Last Vitals:  Vitals:   11/11/15 0730 11/11/15 0755  BP: (!) 157/106 117/86  Pulse: (!) 39 (!) 45  Resp:  18  Temp:      Last Pain:  Vitals:   11/11/15 0653  TempSrc: Oral  PainSc: 3          Complications: No apparent anesthesia complications

## 2015-11-11 NOTE — OR Nursing (Signed)
Dr Mariah MillingGollan called regarding hr 30's to 40s. Nausea. Zofran ordered. Pt instructed to hold Metoprolol and Lisinopril until further notice. Dr Mariah MillingGollan said he would call her later today to check on her to tell her what medication dose to take.

## 2015-11-11 NOTE — Telephone Encounter (Signed)
Agree  Thank you

## 2015-11-11 NOTE — Telephone Encounter (Signed)
Spoke with patient and she states that place on chest has a small rash and is itching from her test today. Let her know that she should clean the site well with soap and water then pat dry. If it continues to itch she can try some over the counter itch cream to see if that helps and if itching becomes really bad then over the counter benadryl may be tried. Instructed her to please give us a call back if she continues to have problems with this and she verbalized understanding of our conversation and and no further questions.

## 2015-11-11 NOTE — OR Nursing (Signed)
Sitting on side of bed denies dizziness. Dr Mariah Millinggollan paged regarding continued low heart rate.

## 2015-11-11 NOTE — Anesthesia Postprocedure Evaluation (Signed)
Anesthesia Post Note  Patient: Kathlen ModyWanda S Giraldo  Procedure(s) Performed: Procedure(s) (LRB): CARDIOVERSION (N/A)  Patient location during evaluation: PACU Anesthesia Type: General Level of consciousness: awake and alert Pain management: pain level controlled Vital Signs Assessment: post-procedure vital signs reviewed and stable Respiratory status: spontaneous breathing, nonlabored ventilation, respiratory function stable and patient connected to nasal cannula oxygen Cardiovascular status: blood pressure returned to baseline and stable Postop Assessment: no signs of nausea or vomiting Anesthetic complications: no    Last Vitals:  Vitals:   11/11/15 0830 11/11/15 0835  BP: (!) 116/98   Pulse: (!) 40 (!) 40  Resp: 17 14  Temp:      Last Pain:  Vitals:   11/11/15 0830  TempSrc:   PainSc: 0-No pain                 Yevette EdwardsJames G Jaedin Regina

## 2015-11-12 ENCOUNTER — Telehealth: Payer: Self-pay | Admitting: Internal Medicine

## 2015-11-12 NOTE — Telephone Encounter (Signed)
Pt states her BP at 11:55 161/91, HR 53. States she checked it all day yesterday and pretty much stayed at this rate. States she had felt light headed today and wanted to let us know. States she had taken all her medications. States she hasnt felt any flutters, just light headed.

## 2015-11-12 NOTE — Telephone Encounter (Signed)
Will forward to MD to review. The patient was seen on 11/06/15- her metoprolol tartrate was increased to 100 mg twice daily She is currently on lisinopril 20 mg once daily. She was DCCV'ed on Tuesday 11/11/15.

## 2015-11-13 NOTE — Telephone Encounter (Signed)
She will need BP measured -- there is also a possibility that this could be a stroke relaed to DCCv although I think very unlikely Thanks

## 2015-11-14 NOTE — Telephone Encounter (Signed)
Called and spoke with patient to see how her blood pressures are running. She states that yesterday AM it was 183/94 and last night it was 169/91 with heart rate of 53. She states that since the procedure her heart rate has not been higher than 58. She currently denies any lightheadedness and states that has gone away for now.   She reports that when she left the hospital after her procedure they told her not to take her metoprolol or lisinopril. She has only been taking her eliquis and wanted to know if and when she should restart those blood pressure medications. Let her know that I would forward this note to Dr. Graciela HusbandsKlein for his review and someone would call her back with instructions. She verbalized understanding and had no further questions at this time.

## 2015-11-14 NOTE — Telephone Encounter (Signed)
Resume lisinopril but not metoprolol

## 2015-11-14 NOTE — Telephone Encounter (Signed)
Spoke with pt She wil resume lisinopril at 20 bid

## 2015-11-18 MED ORDER — LISINOPRIL 20 MG PO TABS: 20.0000 mg | ORAL_TABLET | Freq: Two times a day (BID) | ORAL | Status: DC

## 2015-11-18 NOTE — Addendum Note (Signed)
Addended bySherri Rad: MCGHEE, HEATHER C on: 11/18/2015 11:22 AM   Modules accepted: Orders

## 2015-11-18 NOTE — Progress Notes (Signed)
Cardiology Office Note   Date:  11/25/2015   ID:  Pamela ModyWanda S Zappulla, DOB 1950/10/20, MRN [id number]  [facility]   Cardiologist:   Almond LintAileen Analyssa Downs, MD   Reason for consultation:  Chief Complaint  Patient presents with  . Other    F/u ablation c/o elevated BP. Meds reviewed verbally with pt.      History of Present Illness: [name] is a 65 y.o. female who presents for Follow-up after cardioversion. Since her cardioversion 11/11/2015, patient has noticed an improvement in symptomatology with a change in rhythm. She has since stopped taking her metoprolol because of the bradycardia. There is no episode of lightheadedness or passing out. Her main concern now is blood pressure in the 170s to 190 systolics. She denies lightheadedness, headaches, blurring of vision.   ROS:  Please see the history of present illness. Aside from mentioned under HPI, all other systems are reviewed and negative.     Past Medical History:  Diagnosis Date  . Atrial flutter (HCC) 09/2015   a. CHADS2VASc 2 (HTN, female); b. on Eliquis; c. echo 7/17: EF 50-55%, no RWMA, rhythm atrial flutter, mildly dilated LA/RV, moderately to severely dilated RA, trivial pericardial effusion  . Essential hypertension   . GERD (gastroesophageal reflux disease)   . Subclinical hypothyroidism   . Tobacco abuse     Past Surgical History:  Procedure Laterality Date  . ABDOMINAL HYSTERECTOMY    . ELECTROPHYSIOLOGIC STUDY N/A 11/11/2015   Procedure: CARDIOVERSION;  Surgeon: Antonieta Ibaimothy J Gollan, MD;  Location: [facility] ORS;  Service: Cardiovascular;  Laterality: N/A;     reports that she quit smoking 6 days ago. She has never used smokeless tobacco. She reports that she drinks alcohol. She reports that she does not use drugs.   family history includes Diabetes in her father and mother; Hypertension in her mother.   Outpatient Medications Prior to Visit  Medication Sig Dispense Refill  .  apixaban (ELIQUIS) 5 MG TABS tablet Take 1 tablet (5 mg total) by mouth 2 (two) times daily. 180 tablet 3  . levothyroxine (SYNTHROID, LEVOTHROID) 88 MCG tablet Take 88 mcg by mouth daily before breakfast.     . lisinopril (PRINIVIL,ZESTRIL) 20 MG tablet Take 1 tablet (20 mg total) by mouth 2 (two) times daily.     No facility-administered medications prior to visit.      Allergies: Amoxicillin    PHYSICAL EXAM: VS:  BP (!) 198/88 (BP Location: Left Arm, Patient Position: Sitting, Cuff Size: Normal)   Pulse (!) 49   Ht 5\' 5"  (1.651 m)   Wt 157 lb (71.2 kg)   BMI 26.13 kg/m  , Body mass index is 26.13 kg/m. Wt Readings from Last 3 Encounters:  11/25/15 157 lb (71.2 kg)  11/11/15 158 lb (71.7 kg)  11/06/15 158 lb 12 oz (72 kg)    GENERAL:  well developed, well nourished, not in acute distress HEENT: normocephalic, pink conjunctivae, anicteric sclerae, no xanthelasma, normal dentition, oropharynx clear NECK:  no neck vein engorgement, JVP normal, no hepatojugular reflux, carotid upstroke brisk and symmetric, no bruit, no thyromegaly, no lymphadenopathy LUNGS:  good respiratory effort, clear to auscultation bilaterally CV:  PMI not displaced, no thrills, no lifts, S1 and S2 within normal limits, no palpable S3 or S4, no murmurs, no rubs, no gallops ABD:  Soft, nontender, nondistended, normoactive bowel sounds, no abdominal aortic bruit, no hepatomegaly, no splenomegaly MS: nontender back, no kyphosis, no scoliosis, no  joint deformities EXT:  2+ DP/PT pulses, no edema, no varicosities, no cyanosis, no clubbing SKIN: warm, nondiaphoretic, normal turgor, no ulcers NEUROPSYCH: alert, oriented to person, place, and time, sensory/motor grossly intact, normal mood, appropriate affect  Recent Labs: 09/23/2015: Magnesium 2.0; TSH 21.960 11/01/2015: BUN 16; Creatinine, Ser 0.77; Hemoglobin 13.9; Platelets 163; Potassium 3.7; Sodium 143   Lipid Panel No results found for: CHOL, TRIG, HDL,  CHOLHDL, VLDL, LDLCALC, LDLDIRECT   Other studies Reviewed:  EKG:  The ekg from 11/25/2015 was personally reviewed by me and it revealed sinus bradycardia, 49 BPM. Q waves in V1 and V2.  Additional studies/ records that were reviewed personally reviewed by me today include:  Echo 09/23/2015: Left ventricle: The cavity size was normal. Wall thickness was   increased in a pattern of moderate LVH. Systolic function was   normal. The estimated ejection fraction was in the range of 50%   to 55%. Wall motion was normal; there were no regional wall   motion abnormalities. - Mitral valve: Moderately calcified annulus. - Left atrium: The atrium was mildly dilated. - Right ventricle: The cavity size was mildly dilated. - Right atrium: The atrium was moderately to severely dilated. - Pericardium, extracardiac: A trivial pericardial effusion was   identified.  Nuclear stress test 09/25/2015:  Rhythm remained in atrial flutter throughout the study. There was no ST segment deviation noted during stress.  Myocardial perfusion imaging revealed normal perfusion on stress and rest. No evidence of ischemia.  This is a low risk study.  Nuclear stress EF: 47%. Please verify with echocardiogram.   ASSESSMENT AND PLAN: atrial flutter with variable conduction Status post cardioversion with 150 biphasic joules 11/11/2015, Dr. Mariah Milling CHADS2-VASc= 2. Continue Eliquis Metoprolol on hold. Recommend Holter monitor. Recommend follow-up with PCP or endocrine further hypothyroidism. It was subclinical hypothyroidism TSH was greater than 20.   Hypertension  Continue lisinopril. Will add amlodipine 5 mg by mouth daily. Continue to monitor blood pressure with blood pressure log. Consider evaluation for OSA. Patient has history of snoring, difficult to control blood pressure, arrhythmia history, now bradycardia.   Current medicines are reviewed at length with the patient today.  The patient does not have  concerns regarding medicines.  Labs/ tests ordered today include:  Orders Placed This Encounter  Procedures  . Ambulatory referral to Pulmonology  . Holter monitor - 24 hour  . EKG 12-Lead    I had a lengthy and detailed discussion with the patient regarding diagnoses, prognosis, diagnostic options, treatment options, and side effects of medications.   I counseled the patient on importance of lifestyle modification including heart healthy diet, regular physical activity.   Disposition:   FU with undersigned in one month. Signed, Almond Lint, MD  11/25/2015 4:01 PM    [facility]  This note was generated in part with voice recognition software and I apologize for any typographical errors that were not detected and corrected.

## 2015-11-20 ENCOUNTER — Telehealth: Payer: Self-pay | Admitting: Cardiovascular Disease

## 2015-11-20 NOTE — Telephone Encounter (Signed)
S/w Annette at [facility] who reports paperwork for Eliquis was submitted to our office on Aug 7.  As we do not have record of receiving, she will send another copy.

## 2015-11-20 NOTE — Telephone Encounter (Signed)
Needs form for Eliquis. Please call [facility]

## 2015-11-25 ENCOUNTER — Encounter: Payer: Self-pay | Admitting: Cardiology

## 2015-11-25 ENCOUNTER — Ambulatory Visit (INDEPENDENT_AMBULATORY_CARE_PROVIDER_SITE_OTHER): Payer: Medicaid Other | Admitting: Cardiology

## 2015-11-25 VITALS — BP 198/88 | HR 49 | Ht 65.0 in | Wt 157.0 lb

## 2015-11-25 DIAGNOSIS — I483 Typical atrial flutter: Secondary | ICD-10-CM | POA: Diagnosis not present

## 2015-11-25 DIAGNOSIS — R001 Bradycardia, unspecified: Secondary | ICD-10-CM

## 2015-11-25 DIAGNOSIS — R0683 Snoring: Secondary | ICD-10-CM

## 2015-11-25 DIAGNOSIS — I10 Essential (primary) hypertension: Secondary | ICD-10-CM

## 2015-11-25 MED ORDER — AMLODIPINE BESYLATE 5 MG PO TABS: 5.0000 mg | ORAL_TABLET | Freq: Every day | ORAL | 6 refills | Status: DC

## 2015-11-25 NOTE — Patient Instructions (Addendum)
Medication Instructions:  Your physician has recommended you make the following change in your medication:  1. Amlodipine 5 mg Once Daily  Testing/Procedures: Your physician has recommended that you wear a holter monitor. Holter monitors are medical devices that record the heart's electrical activity. Doctors most often use these monitors to diagnose arrhythmias. Arrhythmias are problems with the speed or rhythm of the heartbeat. The monitor is a small, portable device. You can wear one while you do your normal daily activities. This is usually used to diagnose what is causing palpitations/syncope (passing out).    Follow-Up: Your physician recommends that you schedule a follow-up appointment in: 1 month with Dr. Alvino ChapelIngal.  It was a pleasure seeing you today here in the office. Please do not hesitate to give us a call back if you have any further questions. [phone]  [place] CellarPamela A. RN, BSN      Holter Monitoring A Holter monitor is a small device that is used to detect abnormal heart rhythms. It clips to your clothing and is connected by wires to flat, sticky disks (electrodes) that attach to your chest. It is worn continuously for 24-48 hours. HOME CARE INSTRUCTIONS  Wear your Holter monitor at all times, even while exercising and sleeping, for as long as directed by your health care provider.  Make sure that the Holter monitor is safely clipped to your clothing or close to your body as recommended by your health care provider.  Do not get the monitor or wires wet.  Do not put body lotion or moisturizer on your chest.  Keep your skin clean.  Keep a diary of your daily activities, such as walking and doing chores. If you feel that your heartbeat is abnormal or that your heart is fluttering or skipping a beat:  Record what you are doing when it happens.  Record what time of day the symptoms occur.  Return your Holter monitor as directed by your health care provider.  Keep all  follow-up visits as directed by your health care provider. This is important. SEEK IMMEDIATE MEDICAL CARE IF:  You feel lightheaded or you faint.  You have trouble breathing.  You feel pain in your chest, upper arm, or jaw.  You feel sick to your stomach and your skin is pale, cool, or damp.  You heartbeat feels unusual or abnormal.   This information is not intended to replace advice given to you by your health care provider. Make sure you discuss any questions you have with your health care provider.   Document Released: 11/28/2003 Document Revised: 03/22/2014 Document Reviewed: 10/08/2013 Elsevier Interactive Patient Education [employer]

## 2015-11-28 ENCOUNTER — Telehealth: Payer: Self-pay | Admitting: Cardiology

## 2015-11-28 NOTE — Telephone Encounter (Signed)
Eliquis 5 mg samples placed at front desk for pick up. 

## 2015-11-28 NOTE — Telephone Encounter (Signed)
Patient calling the office for samples of medication:   1.  What medication and dosage are you requesting samples for? Eliquis 5 mg   2.  Are you currently out of this medication?   Has about 5 pills left

## 2015-12-02 ENCOUNTER — Ambulatory Visit (INDEPENDENT_AMBULATORY_CARE_PROVIDER_SITE_OTHER): Payer: Medicaid Other

## 2015-12-02 DIAGNOSIS — I483 Typical atrial flutter: Secondary | ICD-10-CM | POA: Diagnosis not present

## 2015-12-03 ENCOUNTER — Telehealth: Payer: Self-pay | Admitting: Cardiology

## 2015-12-03 MED ORDER — APIXABAN 5 MG PO TABS: 5.0000 mg | ORAL_TABLET | Freq: Two times a day (BID) | ORAL | 3 refills | Status: DC

## 2015-12-03 NOTE — Telephone Encounter (Signed)
Patient calling the office for samples of medication:   1.  What medication and dosage are you requesting samples for? eliquis 5 mg takes 2 a day 2.  Are you currently out of this medication?  Has a few days left

## 2015-12-09 ENCOUNTER — Ambulatory Visit
Admission: RE | Admit: 2015-12-09 | Discharge: 2015-12-09 | Disposition: A | Discharge status: Home / Self Care | Payer: Medicaid Other | Source: Ambulatory Visit | Attending: Cardiology | Admitting: Cardiology

## 2015-12-09 DIAGNOSIS — I10 Essential (primary) hypertension: Secondary | ICD-10-CM | POA: Diagnosis not present

## 2015-12-09 DIAGNOSIS — I483 Typical atrial flutter: Secondary | ICD-10-CM | POA: Insufficient documentation

## 2015-12-11 ENCOUNTER — Ambulatory Visit (INDEPENDENT_AMBULATORY_CARE_PROVIDER_SITE_OTHER): Payer: Medicaid Other | Admitting: Internal Medicine

## 2015-12-11 ENCOUNTER — Encounter: Payer: Self-pay | Admitting: Internal Medicine

## 2015-12-11 VITALS — BP 150/74 | HR 53 | Ht 65.0 in | Wt 156.0 lb

## 2015-12-11 DIAGNOSIS — Z01812 Encounter for preprocedural laboratory examination: Secondary | ICD-10-CM | POA: Diagnosis not present

## 2015-12-11 DIAGNOSIS — I10 Essential (primary) hypertension: Secondary | ICD-10-CM

## 2015-12-11 DIAGNOSIS — I483 Typical atrial flutter: Secondary | ICD-10-CM

## 2015-12-11 MED ORDER — LISINOPRIL 20 MG PO TABS: 20.0000 mg | ORAL_TABLET | Freq: Two times a day (BID) | ORAL | 3 refills | Status: DC

## 2015-12-11 MED ORDER — AMLODIPINE BESYLATE 10 MG PO TABS: 10.0000 mg | ORAL_TABLET | Freq: Every day | ORAL | 3 refills | Status: DC

## 2015-12-11 NOTE — Patient Instructions (Signed)
Medication Instructions: - Your physician has recommended you make the following change in your medication:  1) Increase amlodipine to 10 mg once daily  Labwork: - none today  Procedures/Testing: - Your physician has recommended that you have an Atrial Flutter ablation (01/16/16 [facility] [place] with Dr. Graciela HusbandsKlein). Catheter ablation is a medical procedure used to treat some cardiac arrhythmias (irregular heartbeats). During catheter ablation, a long, thin, flexible tube is put into a blood vessel in your groin (upper thigh), or neck. This tube is called an ablation catheter. It is then guided to your heart through the blood vessel. Radio frequency waves destroy small areas of heart tissue where abnormal heartbeats may cause an arrhythmia to start.   Herbert Seta- Silveria Botz, RN for Dr. Graciela HusbandsKlein will be in touch with you to confirm the time and go over your instructions.   Follow-Up: - Your physician recommends that you schedule a follow-up appointment in: 4 weeks (from 01/16/16) with Dr. Graciela HusbandsKlein.  Any Additional Special Instructions Will Be Listed Below (If Applicable).     If you need a refill on your cardiac medications before your next appointment, please call your pharmacy.

## 2015-12-11 NOTE — Progress Notes (Signed)
      Patient Care Team: [facility] as PCP - General   HPI  [name] is a 65 y.o. female Seen in follow-up for atrial flutter for which catheter ablation have been discussed. It had been postponed because of pain in her leg evaluation which was ongoing.   she underwent cardioversion in late August and is holding sinus rhythm.  She has stopped smoking hooray and feels better    Eval 7/17  Echo  LVH 15/13 LAE  (46/2.5/42) 7/17  Myoview  EF 47% no ischemia  Records and Results Reviewed Holter monitor demonstrated sinus rhythm with frequent atrial ectopy  She has been diagnosed with hypothyroidism and is currently on Synthroid  Past Medical History:  Diagnosis Date  . Atrial flutter (HCC) 09/2015   a. CHADS2VASc 2 (HTN, female); b. on Eliquis; c. echo 7/17: EF 50-55%, no RWMA, rhythm atrial flutter, mildly dilated LA/RV, moderately to severely dilated RA, trivial pericardial effusion  . Essential hypertension   . GERD (gastroesophageal reflux disease)   . Subclinical hypothyroidism   . Tobacco abuse     Past Surgical History:  Procedure Laterality Date  . ABDOMINAL HYSTERECTOMY    . ELECTROPHYSIOLOGIC STUDY N/A 11/11/2015   Procedure: CARDIOVERSION;  Surgeon: Antonieta Ibaimothy J Gollan, MD;  Location: [facility] ORS;  Service: Cardiovascular;  Laterality: N/A;    Current Outpatient Prescriptions  Medication Sig Dispense Refill  . amLODipine (NORVASC) 5 MG tablet Take 1 tablet (5 mg total) by mouth daily. 30 tablet 6  . apixaban (ELIQUIS) 5 MG TABS tablet Take 1 tablet (5 mg total) by mouth 2 (two) times daily. 180 tablet 3  . levothyroxine (SYNTHROID, LEVOTHROID) 88 MCG tablet Take 88 mcg by mouth daily before breakfast.     . lisinopril (PRINIVIL,ZESTRIL) 20 MG tablet Take 1 tablet (20 mg total) by mouth 2 (two) times daily.     No current facility-administered medications for this visit.     Allergies  Allergen Reactions  . Amoxicillin Itching       Review of Systems negative except from HPI and PMH  Physical Exam BP (!) 150/74 (BP Location: Left Arm, Patient Position: Sitting, Cuff Size: Normal)   Pulse (!) 53   Ht 5\' 5"  (1.651 m)   Wt 156 lb (70.8 kg)   BMI 25.96 kg/m  Well developed and well nourished in no acute distress HENT normal E scleral and icterus clear Neck Supple JVP flat; carotids brisk and full Clear to ausculation  *Regular rate and rhythm, no murmurs gallops or rub Soft with active bowel sounds No clubbing cyanosis  Edema Alert and oriented, grossly normal motor and sensory function Skin Warm and Dry  ECG demonstrates sinus at 53 Interval 17/09/43  Assessment and  Plan Atrial flutter-persistent holding sinus  Hypertension  Hypothyroidism-treated  Cigarette abuse-stopped YEAH  Sinus bradycardia-asymptomatic  We will plan to undertake flutter ablation to eliminate the substrate and thereafter discontinue her anticoagulation  We have reviewed the risks and benefits including but not limited to perforation heart block requiring pacing death  Her blood pressure is elevated; we will increase her amlodipine 5--10  She has frequent atrial ectopy on Holter monitor. I suspect that atrial fibrillation may be a future occurrence for hopefully a long way off.  She has stopped smoking  Current medicines are reviewed at length with the patient today .  The patient does not  have concerns regarding medicines.

## 2015-12-12 ENCOUNTER — Encounter: Payer: Self-pay | Admitting: *Deleted

## 2015-12-14 ENCOUNTER — Emergency Department
Admission: EM | Admit: 2015-12-14 | Discharge: 2015-12-14 | Disposition: A | Discharge status: Home / Self Care | Payer: Medicaid Other | Attending: Student in an Organized Health Care Education/Training Program | Admitting: Student in an Organized Health Care Education/Training Program

## 2015-12-14 ENCOUNTER — Emergency Department: Payer: Medicaid Other

## 2015-12-14 ENCOUNTER — Encounter: Payer: Self-pay | Admitting: Emergency Medicine

## 2015-12-14 DIAGNOSIS — Z79899 Other long term (current) drug therapy: Secondary | ICD-10-CM | POA: Insufficient documentation

## 2015-12-14 DIAGNOSIS — X501XXA Overexertion from prolonged static or awkward postures, initial encounter: Secondary | ICD-10-CM | POA: Insufficient documentation

## 2015-12-14 DIAGNOSIS — M25572 Pain in left ankle and joints of left foot: Secondary | ICD-10-CM | POA: Diagnosis not present

## 2015-12-14 DIAGNOSIS — Y929 Unspecified place or not applicable: Secondary | ICD-10-CM | POA: Diagnosis not present

## 2015-12-14 DIAGNOSIS — E039 Hypothyroidism, unspecified: Secondary | ICD-10-CM | POA: Diagnosis not present

## 2015-12-14 DIAGNOSIS — I10 Essential (primary) hypertension: Secondary | ICD-10-CM | POA: Diagnosis not present

## 2015-12-14 DIAGNOSIS — Y999 Unspecified external cause status: Secondary | ICD-10-CM | POA: Diagnosis not present

## 2015-12-14 DIAGNOSIS — Z87891 Personal history of nicotine dependence: Secondary | ICD-10-CM | POA: Diagnosis not present

## 2015-12-14 DIAGNOSIS — Y9389 Activity, other specified: Secondary | ICD-10-CM | POA: Diagnosis not present

## 2015-12-14 MED ORDER — ACETAMINOPHEN 325 MG PO TABS
650.0000 mg | ORAL_TABLET | Freq: Once | ORAL | Status: AC
  Administered 2015-12-14: 650 mg via ORAL
  Filled 2015-12-14: qty 2

## 2015-12-14 MED ORDER — NAPROXEN 375 MG PO TABS: 375.0000 mg | ORAL_TABLET | Freq: Two times a day (BID) | ORAL | 0 refills | Status: AC

## 2015-12-14 MED ORDER — HYDROCODONE-ACETAMINOPHEN 5-325 MG PO TABS
1.0000 | ORAL_TABLET | Freq: Once | ORAL | Status: AC
  Administered 2015-12-14: 1 via ORAL
  Filled 2015-12-14: qty 1

## 2015-12-14 NOTE — ED Triage Notes (Signed)
No swelling, no redness. States the pain goes from ankle into her heel. Did not medicate today

## 2015-12-14 NOTE — ED Provider Notes (Signed)
[facility] Emergency Department Provider Note    First MD Initiated Contact with Patient 12/14/15 1826     (approximate)  I have reviewed the triage vital signs and the nursing notes.   HISTORY  Chief Complaint Ankle Pain    HPI [name] is a 65 y.o. female pain to her left ankle after rolling it while getting out of car to go to church today. Patient was initially able to ambulate on the foot denies any other associated injuries. Has not medicated at home today. Pain is gotten worse throughout the day. Denies any numbness or tingling.   Past Medical History:  Diagnosis Date  . Atrial flutter (HCC) 09/2015   a. CHADS2VASc 2 (HTN, female); b. on Eliquis; c. echo 7/17: EF 50-55%, no RWMA, rhythm atrial flutter, mildly dilated LA/RV, moderately to severely dilated RA, trivial pericardial effusion  . Essential hypertension   . GERD (gastroesophageal reflux disease)   . Subclinical hypothyroidism   . Tobacco abuse     Patient Active Problem List   Diagnosis Date Noted  . SOB (shortness of breath)   . Subclinical hypothyroidism   . Typical atrial flutter (HCC) 09/24/2015  . Essential hypertension 09/23/2015  . Tobacco abuse 09/23/2015    Past Surgical History:  Procedure Laterality Date  . ABDOMINAL HYSTERECTOMY    . ELECTROPHYSIOLOGIC STUDY N/A 11/11/2015   Procedure: CARDIOVERSION;  Surgeon: Antonieta Iba, MD;  Location: [facility] ORS;  Service: Cardiovascular;  Laterality: N/A;    Prior to Admission medications   Medication Sig Start Date End Date Taking? Authorizing Provider  amLODipine (NORVASC) 10 MG tablet Take 1 tablet (10 mg total) by mouth daily. 12/11/15 03/10/16  Duke Salvia, MD  apixaban (ELIQUIS) 5 MG TABS tablet Take 1 tablet (5 mg total) by mouth 2 (two) times daily. 12/03/15   Almond Lint, MD  levothyroxine (SYNTHROID, LEVOTHROID) 88 MCG tablet Take 88 mcg by mouth daily before breakfast.  10/24/15 10/23/16  Historical  Provider, MD  lisinopril (PRINIVIL,ZESTRIL) 20 MG tablet Take 1 tablet (20 mg total) by mouth 2 (two) times daily. 12/11/15   Duke Salvia, MD    Allergies Amoxicillin  Family History  Problem Relation Age of Onset  . Hypertension Mother   . Diabetes Mother   . Diabetes Father     Social History Social History  Substance Use Topics  . Smoking status: Former Smoker    Quit date: 11/19/2015  . Smokeless tobacco: Never Used  . Alcohol use Yes     Comment: occasionally    Review of Systems Patient denies headaches, rhinorrhea, blurry vision, numbness, shortness of breath, chest pain, edema, cough, abdominal pain, nausea, vomiting, diarrhea, dysuria, fevers, rashes or hallucinations unless otherwise stated above in HPI. ____________________________________________   PHYSICAL EXAM:  VITAL SIGNS: Vitals:   12/14/15 1753  BP: (!) 156/86  Pulse: (!) 59  Resp: 20  Temp: 98 F (36.7 C)    Constitutional: Alert and oriented. Well appearing and in no acute distress. Eyes: Conjunctivae are normal. PERRL. EOMI. Head: Atraumatic. Nose: No congestion/rhinnorhea. Mouth/Throat: Mucous membranes are moist.  Oropharynx non-erythematous. Neck: No stridor. Painless ROM. No cervical spine tenderness to palpation Hematological/Lymphatic/Immunilogical: No cervical lymphadenopathy. Cardiovascular: Normal rate, regular rhythm. Grossly normal heart sounds.  Good peripheral circulation. Respiratory: Normal respiratory effort.  No retractions. Lungs CTAB. Gastrointestinal: Soft and nontender. No distention. No abdominal bruits. No CVA tenderness. Genitourinary:  Musculoskeletal: no edema.  No joint effusions.  There is tender  to palpation of the medial malleolus as well as the calcaneus without any effusion and swelling erythema. Patient able to move all digits. No tenderness to palpation over the base of the fifth metatarsal. No proximal fibular tenderness to palpation.  Patient was able to  ambulate with a steady gait. Neurologic:  Normal speech and language. No gross focal neurologic deficits are appreciated. No gait instability. Skin:  Skin is warm, dry and intact. No rash noted. Psychiatric: Mood and affect are normal. Speech and behavior are normal.  ____________________________________________   LABS (all labs ordered are listed, but only abnormal results are displayed)  No results found for this or any previous visit (from the past 24 hour(s)). ____________________________________________ ____________________________________________  RADIOLOGY  I personally reviewed all radiographic images ordered to evaluate for the above acute complaints and reviewed radiology reports and findings.  These findings were personally discussed with the patient.  Please see medical record for radiology report.  ____________________________________________   PROCEDURES  Procedure(s) performed: none    Critical Care performed: no ____________________________________________   INITIAL IMPRESSION / ASSESSMENT AND PLAN / ED COURSE  Pertinent labs & imaging results that were available during my care of the patient were reviewed by me and considered in my medical decision making (see chart for details).  DDX: sprain, fracture, dislocation, contusion  [name] is a 65 y.o. who presents to the ED with 65 y.o. female with left ankle injury. Patient is AFVSS in ED. Exam as above. Given current presentation have considered the above differential. Denies any other injuries. Denies motor or sensory loss. Able to bear weight. Afebrile and VSS in Ed. Exam as above. NV intact throughout and distal to injury. Pt able to range joint. No clinical suspicion for infectious process or septic joint. Treatments will include observation, X-rays.  X-rays w/o fracture. No other injuries reported or noted on exam. Presentation c/w sprain. Discussed supportive care and follow up with  pt.   Clinical Course     ____________________________________________   FINAL CLINICAL IMPRESSION(S) / ED DIAGNOSES  Final diagnoses:  Acute left ankle pain      NEW MEDICATIONS STARTED DURING THIS VISIT:  New Prescriptions   No medications on file     Note:  This document was prepared using Dragon voice recognition software and may include unintentional dictation errors.    Willy EddyPatrick Maniyah Moller, MD 12/14/15 Windell [id number]

## 2015-12-14 NOTE — ED Triage Notes (Signed)
Twisted ankle earlier today and had no pain and continued with her day. Then started hurting around 4pm.

## 2015-12-14 NOTE — ED Notes (Signed)
Pt presents with left ankle pain since she twisted it this afternoon. Pt states she stepped on a rock and that her pain has been increasing ever since. Pt alert & oriented with NAD noted.

## 2015-12-30 ENCOUNTER — Encounter: Payer: Self-pay | Admitting: Cardiology

## 2015-12-30 ENCOUNTER — Ambulatory Visit (INDEPENDENT_AMBULATORY_CARE_PROVIDER_SITE_OTHER): Payer: Medicaid Other | Admitting: Cardiology

## 2015-12-30 VITALS — BP 130/74 | HR 63 | Ht 65.0 in | Wt 161.4 lb

## 2015-12-30 DIAGNOSIS — I4892 Unspecified atrial flutter: Secondary | ICD-10-CM | POA: Diagnosis not present

## 2015-12-30 DIAGNOSIS — I10 Essential (primary) hypertension: Secondary | ICD-10-CM

## 2015-12-30 DIAGNOSIS — I483 Typical atrial flutter: Secondary | ICD-10-CM | POA: Diagnosis not present

## 2015-12-30 NOTE — Progress Notes (Signed)
Cardiology Office Note   Date:  12/31/2015   ID:  NYAISHA SIMAO, DOB 03-20-1950, MRN [id number]  Referring Doctor:  [facility]   Cardiologist:   Almond Lint, MD   Reason for consultation:  No chief complaint on file. Follow-up for atrial flutter    History of Present Illness: [name] is a 65 y.o. female who presents for Follow-up for atrial flutter   Since her cardioversion 11/11/2015, patient has noticed an improvement in symptomatology with a change in rhythm. She has since stopped taking her metoprolol because of the bradycardia. There is no episode of lightheadedness or passing out.   Since increasing dose of amlodipine by Dr. Graciela Husbands, blood pressure has improved.  She is awaiting scheduled  atrial flutter ablation care of Dr. Graciela Husbands November 3rd  Patient denies chest pain, shortness of breath.. She denies lightheadedness, headaches, blurring of vision.   ROS:  Please see the history of present illness. Aside from mentioned under HPI, all other systems are reviewed and negative.     Past Medical History:  Diagnosis Date  . Atrial flutter (HCC) 09/2015   a. CHADS2VASc 2 (HTN, female); b. on Eliquis; c. echo 7/17: EF 50-55%, no RWMA, rhythm atrial flutter, mildly dilated LA/RV, moderately to severely dilated RA, trivial pericardial effusion  . Essential hypertension   . GERD (gastroesophageal reflux disease)   . Subclinical hypothyroidism   . Tobacco abuse     Past Surgical History:  Procedure Laterality Date  . ABDOMINAL HYSTERECTOMY    . ELECTROPHYSIOLOGIC STUDY N/A 11/11/2015   Procedure: CARDIOVERSION;  Surgeon: Antonieta Iba, MD;  Location: [facility] ORS;  Service: Cardiovascular;  Laterality: N/A;     reports that she quit smoking about 6 weeks ago. She has never used smokeless tobacco. She reports that she drinks alcohol. She reports that she does not use drugs.   family history includes Diabetes in her father and mother;  Hypertension in her mother.   Outpatient Medications Prior to Visit  Medication Sig Dispense Refill  . amLODipine (NORVASC) 10 MG tablet Take 1 tablet (10 mg total) by mouth daily. 90 tablet 3  . apixaban (ELIQUIS) 5 MG TABS tablet Take 1 tablet (5 mg total) by mouth 2 (two) times daily. 180 tablet 3  . levothyroxine (SYNTHROID, LEVOTHROID) 88 MCG tablet Take 88 mcg by mouth daily before breakfast.     . lisinopril (PRINIVIL,ZESTRIL) 20 MG tablet Take 1 tablet (20 mg total) by mouth 2 (two) times daily. 180 tablet 3   No facility-administered medications prior to visit.      Allergies: Amoxicillin    PHYSICAL EXAM: VS:  BP 130/74 (BP Location: Left Arm, Patient Position: Sitting, Cuff Size: Normal)   Pulse 63   Ht 5\' 5"  (1.651 m)   Wt 161 lb 6.4 oz (73.2 kg)   BMI 26.86 kg/m  , Body mass index is 26.86 kg/m. Wt Readings from Last 3 Encounters:  12/30/15 161 lb 6.4 oz (73.2 kg)  12/14/15 158 lb (71.7 kg)  12/11/15 156 lb (70.8 kg)    GENERAL:  well developed, well nourished, not in acute distress HEENT: normocephalic, pink conjunctivae, anicteric sclerae, no xanthelasma, normal dentition, oropharynx clear NECK:  no neck vein engorgement, JVP normal, no hepatojugular reflux, carotid upstroke brisk and symmetric, no bruit, no thyromegaly, no lymphadenopathy LUNGS:  good respiratory effort, clear to auscultation bilaterally CV:  PMI not displaced, no thrills, no lifts, S1 and S2 within normal limits, no palpable  S3 or S4, no murmurs, no rubs, no gallops ABD:  Soft, nontender, nondistended, normoactive bowel sounds, no abdominal aortic bruit, no hepatomegaly, no splenomegaly MS: nontender back, no kyphosis, no scoliosis, no joint deformities EXT:  2+ DP/PT pulses, no edema, no varicosities, no cyanosis, no clubbing SKIN: warm, nondiaphoretic, normal turgor, no ulcers NEUROPSYCH: alert, oriented to person, place, and time, sensory/motor grossly intact, normal mood, appropriate  affect  Recent Labs: 09/23/2015: Magnesium 2.0; TSH 21.960 11/01/2015: BUN 16; Creatinine, Ser 0.77; Hemoglobin 13.9; Platelets 163; Potassium 3.7; Sodium 143   Lipid Panel No results found for: CHOL, TRIG, HDL, CHOLHDL, VLDL, LDLCALC, LDLDIRECT   Other studies Reviewed:  EKG:  The ekg from 11/25/2015 was personally reviewed by me and it revealed sinus bradycardia, 49 BPM. Q waves in V1 and V2.  Additional studies/ records that were reviewed personally reviewed by me today include:  Echo 09/23/2015: Left ventricle: The cavity size was normal. Wall thickness was   increased in a pattern of moderate LVH. Systolic function was   normal. The estimated ejection fraction was in the range of 50%   to 55%. Wall motion was normal; there were no regional wall   motion abnormalities. - Mitral valve: Moderately calcified annulus. - Left atrium: The atrium was mildly dilated. - Right ventricle: The cavity size was mildly dilated. - Right atrium: The atrium was moderately to severely dilated. - Pericardium, extracardiac: A trivial pericardial effusion was   identified.  Nuclear stress test 09/25/2015:  Rhythm remained in atrial flutter throughout the study. There was no ST segment deviation noted during stress.  Myocardial perfusion imaging revealed normal perfusion on stress and rest. No evidence of ischemia.  This is a low risk study.  Nuclear stress EF: 47%. Please verify with echocardiogram.  Holter monitor 12/02/2015: 24-hour Holter  Overall rhythm was sinus. Heart rate ranged from 45-127 BPM, average of 62 BPM. The maximum heart rate was actually a run of atrial tachycardia, as described below. Longest RR interval was normal at 1.76 seconds.  Supraventricular ectopy: 193 isolated PACs. 11 atrial couplets. 3 runs totaling 26 beats - 17 beats run of likely atrial tachycardia at 128 BPM as the longest. The fastest was a 5 beat run of likely atrial tachycardia at 154  BPM.  Ventricular ectopy: 80 isolated PVCs. 2 ventricular couplets. No ventricular runs.  No documented atrial fibrillation.  ASSESSMENT AND PLAN: atrial flutter with variable conduction Status post cardioversion with 150 biphasic joules 11/11/2015, Dr. Mariah Milling Sinus rhythm CHADS2-VASc= 2. Continue Eliquis Metoprolol on hold. She is scheduled for atrial flutter ablation November 3, Dr. Graciela Husbands. Recommend follow-up with PCP or endocrine further hypothyroidism. It was subclinical hypothyroidism TSH was greater than 20.  Hypertension  Continue lisinopril. Continue amlodipine now at 10 mg by mouth daily. Continue to monitor blood pressure with blood pressure log. Consider evaluation for OSA. Patient has history of snoring, difficult to control blood pressure, arrhythmia history, now bradycardia.   Current medicines are reviewed at length with the patient today.  The patient does not have concerns regarding medicines.  Labs/ tests ordered today include:  No orders of the defined types were placed in this encounter.   I had a lengthy and detailed discussion with the patient regarding diagnoses, prognosis, diagnostic options, treatment options, and side effects of medications.   I counseled the patient on importance of lifestyle modification including heart healthy diet, regular physical activity.   Disposition:   FU with undersigned in 3 months   Signed, Karie Kirks  Alvino ChapelIngal, MD  12/31/2015 10:03 AM    [facility]  This note was generated in part with voice recognition software and I apologize for any typographical errors that were not detected and corrected.

## 2015-12-30 NOTE — Patient Instructions (Signed)
Follow-Up: Your physician recommends that you schedule a follow-up appointment in: 3 months with Dr. Ingal.   It was a pleasure seeing you today here in the office. Please do not hesitate to give us a call back if you have any further questions. [phone]  Diyana Starrett A. RN, BSN   

## 2016-01-09 ENCOUNTER — Other Ambulatory Visit (INDEPENDENT_AMBULATORY_CARE_PROVIDER_SITE_OTHER): Payer: Medicaid Other

## 2016-01-09 ENCOUNTER — Telehealth: Payer: Self-pay | Admitting: Internal Medicine

## 2016-01-09 DIAGNOSIS — I483 Typical atrial flutter: Secondary | ICD-10-CM

## 2016-01-09 DIAGNOSIS — Z01812 Encounter for preprocedural laboratory examination: Secondary | ICD-10-CM

## 2016-01-09 NOTE — Telephone Encounter (Signed)
Pt reports concerns about upcoming ablation d/t discussions with friends and family. Reviewed ablation information. Pt states she feels better now and will proceed w/procedure. States she will arrive 5:30am, [place].  Pt had no further questions at this time.

## 2016-01-09 NOTE — Telephone Encounter (Signed)
Patient has some questions about the upcoming ablation.  Patient says she is feeling some type of way about it. Patient wants to get details and discuss the procedure .  Please call.

## 2016-01-10 LAB — CBC WITH DIFFERENTIAL/PLATELET
BASOS ABS: 0 10*3/uL (ref 0.0–0.2)
Basos: 0 %
EOS (ABSOLUTE): 0.1 10*3/uL (ref 0.0–0.4)
Eos: 2 %
HEMOGLOBIN: 12.1 g/dL (ref 11.1–15.9)
Hematocrit: 38.8 % (ref 34.0–46.6)
Immature Grans (Abs): 0 10*3/uL (ref 0.0–0.1)
Immature Granulocytes: 0 %
LYMPHS ABS: 1.4 10*3/uL (ref 0.7–3.1)
Lymphs: 24 %
MCH: 22.9 pg — AB (ref 26.6–33.0)
MCHC: 31.2 g/dL — AB (ref 31.5–35.7)
MCV: 73 fL — ABNORMAL LOW (ref 79–97)
MONOCYTES: 9 %
MONOS ABS: 0.5 10*3/uL (ref 0.1–0.9)
NEUTROS ABS: 3.7 10*3/uL (ref 1.4–7.0)
Neutrophils: 65 %
Platelets: 203 10*3/uL (ref 150–379)
RBC: 5.29 x10E6/uL — ABNORMAL HIGH (ref 3.77–5.28)
RDW: 15.4 % (ref 12.3–15.4)
WBC: 5.8 10*3/uL (ref 3.4–10.8)

## 2016-01-10 LAB — BASIC METABOLIC PANEL
BUN / CREAT RATIO: 26 (ref 12–28)
BUN: 18 mg/dL (ref 8–27)
CHLORIDE: 105 mmol/L (ref 96–106)
CO2: 23 mmol/L (ref 18–29)
Calcium: 9.1 mg/dL (ref 8.7–10.3)
Creatinine, Ser: 0.7 mg/dL (ref 0.57–1.00)
GFR calc non Af Amer: 93 mL/min/{1.73_m2} (ref >59)
GFR, EST AFRICAN AMERICAN: 107 mL/min/{1.73_m2} (ref >59)
GLUCOSE: 88 mg/dL (ref 65–99)
POTASSIUM: 4.6 mmol/L (ref 3.5–5.2)
SODIUM: 143 mmol/L (ref 134–144)

## 2016-01-16 ENCOUNTER — Encounter (HOSPITAL_COMMUNITY)
Admission: RE | Disposition: A | Discharge status: Home / Self Care | Payer: Self-pay | Source: Ambulatory Visit | Attending: Internal Medicine

## 2016-01-16 ENCOUNTER — Ambulatory Visit (HOSPITAL_COMMUNITY)
Admission: RE | Admit: 2016-01-16 | Discharge: 2016-01-17 | Disposition: A | Discharge status: Home / Self Care | Payer: Medicaid Other | Source: Ambulatory Visit | Attending: Internal Medicine | Admitting: Internal Medicine

## 2016-01-16 ENCOUNTER — Encounter (HOSPITAL_COMMUNITY): Payer: Self-pay | Admitting: General Practice

## 2016-01-16 ENCOUNTER — Ambulatory Visit (HOSPITAL_COMMUNITY): Payer: Medicaid Other | Admitting: Certified Registered Nurse Anesthetist

## 2016-01-16 DIAGNOSIS — Z7901 Long term (current) use of anticoagulants: Secondary | ICD-10-CM | POA: Diagnosis not present

## 2016-01-16 DIAGNOSIS — Z79899 Other long term (current) drug therapy: Secondary | ICD-10-CM | POA: Diagnosis not present

## 2016-01-16 DIAGNOSIS — Z87891 Personal history of nicotine dependence: Secondary | ICD-10-CM | POA: Diagnosis not present

## 2016-01-16 DIAGNOSIS — I10 Essential (primary) hypertension: Secondary | ICD-10-CM | POA: Insufficient documentation

## 2016-01-16 DIAGNOSIS — E039 Hypothyroidism, unspecified: Secondary | ICD-10-CM | POA: Diagnosis not present

## 2016-01-16 DIAGNOSIS — R001 Bradycardia, unspecified: Secondary | ICD-10-CM | POA: Insufficient documentation

## 2016-01-16 DIAGNOSIS — K219 Gastro-esophageal reflux disease without esophagitis: Secondary | ICD-10-CM | POA: Diagnosis not present

## 2016-01-16 DIAGNOSIS — I4892 Unspecified atrial flutter: Secondary | ICD-10-CM | POA: Diagnosis present

## 2016-01-16 DIAGNOSIS — Z8249 Family history of ischemic heart disease and other diseases of the circulatory system: Secondary | ICD-10-CM | POA: Diagnosis not present

## 2016-01-16 DIAGNOSIS — I483 Typical atrial flutter: Secondary | ICD-10-CM

## 2016-01-16 HISTORY — PX: ELECTROPHYSIOLOGIC STUDY: SHX172A

## 2016-01-16 HISTORY — PX: ABLATION OF DYSRHYTHMIC FOCUS: SHX254

## 2016-01-16 SURGERY — A-FLUTTER/A-TACH/SVT ABLATION
Anesthesia: General

## 2016-01-16 MED ORDER — LEVOTHYROXINE SODIUM 88 MCG PO TABS
88.0000 ug | ORAL_TABLET | Freq: Every day | ORAL | Status: DC
  Administered 2016-01-17: 88 ug via ORAL
  Filled 2016-01-16: qty 1

## 2016-01-16 MED ORDER — OXYCODONE HCL 5 MG/5ML PO SOLN: 5.0000 mg | Freq: Once | ORAL | Status: DC | PRN

## 2016-01-16 MED ORDER — SUCCINYLCHOLINE CHLORIDE 200 MG/10ML IV SOSY
PREFILLED_SYRINGE | INTRAVENOUS | Status: DC | PRN
  Administered 2016-01-16: 140 mg via INTRAVENOUS

## 2016-01-16 MED ORDER — ACETAMINOPHEN 500 MG PO TABS: 500.0000 mg | ORAL_TABLET | Freq: Four times a day (QID) | ORAL | Status: DC | PRN

## 2016-01-16 MED ORDER — ROCURONIUM BROMIDE 100 MG/10ML IV SOLN
INTRAVENOUS | Status: DC | PRN
  Administered 2016-01-16: 30 mg via INTRAVENOUS

## 2016-01-16 MED ORDER — FENTANYL CITRATE (PF) 100 MCG/2ML IJ SOLN
INTRAMUSCULAR | Status: DC | PRN
Start: 2016-01-16 — End: 2016-01-16
  Administered 2016-01-16 (×2): 50 ug via INTRAVENOUS

## 2016-01-16 MED ORDER — VITAMIN D 1000 UNITS PO TABS
1000.0000 [IU] | ORAL_TABLET | Freq: Every day | ORAL | Status: DC
  Administered 2016-01-16 – 2016-01-17 (×2): 1000 [IU] via ORAL
  Filled 2016-01-16 (×2): qty 1

## 2016-01-16 MED ORDER — ONDANSETRON HCL 4 MG/2ML IJ SOLN
4.0000 mg | Freq: Four times a day (QID) | INTRAMUSCULAR | Status: DC | PRN
Start: 2016-01-16 — End: 2016-01-17

## 2016-01-16 MED ORDER — ONDANSETRON HCL 4 MG/2ML IJ SOLN
INTRAMUSCULAR | Status: DC | PRN
  Administered 2016-01-16: 4 mg via INTRAVENOUS

## 2016-01-16 MED ORDER — SODIUM CHLORIDE 0.9% FLUSH
3.0000 mL | Freq: Two times a day (BID) | INTRAVENOUS | Status: DC
  Administered 2016-01-16: 3 mL via INTRAVENOUS

## 2016-01-16 MED ORDER — PROPOFOL 10 MG/ML IV BOLUS
INTRAVENOUS | Status: DC | PRN
  Administered 2016-01-16: 160 mg via INTRAVENOUS
  Administered 2016-01-16 (×3): 20 mg via INTRAVENOUS

## 2016-01-16 MED ORDER — FENTANYL CITRATE (PF) 100 MCG/2ML IJ SOLN: 25.0000 ug | INTRAMUSCULAR | Status: DC | PRN

## 2016-01-16 MED ORDER — APIXABAN 5 MG PO TABS: 5.0000 mg | ORAL_TABLET | Freq: Two times a day (BID) | ORAL | Status: DC

## 2016-01-16 MED ORDER — SODIUM CHLORIDE 0.9 % IV SOLN
INTRAVENOUS | Status: DC | PRN
  Administered 2016-01-16 (×2): via INTRAVENOUS

## 2016-01-16 MED ORDER — HEPARIN (PORCINE) IN NACL 2-0.9 UNIT/ML-% IJ SOLN
INTRAMUSCULAR | Status: DC | PRN
  Administered 2016-01-16: 10:00:00

## 2016-01-16 MED ORDER — LISINOPRIL 20 MG PO TABS
20.0000 mg | ORAL_TABLET | Freq: Two times a day (BID) | ORAL | Status: DC
  Administered 2016-01-16 – 2016-01-17 (×2): 20 mg via ORAL
  Filled 2016-01-16 (×2): qty 1

## 2016-01-16 MED ORDER — MIDAZOLAM HCL 5 MG/5ML IJ SOLN
INTRAMUSCULAR | Status: DC | PRN
  Administered 2016-01-16: 2 mg via INTRAVENOUS

## 2016-01-16 MED ORDER — BUPIVACAINE HCL (PF) 0.25 % IJ SOLN
INTRAMUSCULAR | Status: DC | PRN
  Administered 2016-01-16: 30 mL

## 2016-01-16 MED ORDER — ACETAMINOPHEN 325 MG PO TABS
650.0000 mg | ORAL_TABLET | ORAL | Status: DC | PRN
  Administered 2016-01-16: 650 mg via ORAL
  Filled 2016-01-16 (×2): qty 2

## 2016-01-16 MED ORDER — SODIUM CHLORIDE 0.9% FLUSH: 3.0000 mL | INTRAVENOUS | Status: DC | PRN

## 2016-01-16 MED ORDER — AMLODIPINE BESYLATE 10 MG PO TABS
10.0000 mg | ORAL_TABLET | Freq: Every day | ORAL | Status: DC
  Administered 2016-01-17: 10 mg via ORAL
  Filled 2016-01-16: qty 1

## 2016-01-16 MED ORDER — SODIUM CHLORIDE 0.9 % IV SOLN
INTRAVENOUS | Status: DC
  Administered 2016-01-16: 06:00:00 via INTRAVENOUS

## 2016-01-16 MED ORDER — SODIUM CHLORIDE 0.9 % IV SOLN
250.0000 mL | INTRAVENOUS | Status: DC | PRN
Start: 2016-01-16 — End: 2016-01-17

## 2016-01-16 MED ORDER — HEPARIN (PORCINE) IN NACL 2-0.9 UNIT/ML-% IJ SOLN
INTRAMUSCULAR | Status: AC
  Filled 2016-01-16: qty 500

## 2016-01-16 MED ORDER — BUPIVACAINE HCL (PF) 0.25 % IJ SOLN
INTRAMUSCULAR | Status: AC
  Filled 2016-01-16: qty 60

## 2016-01-16 MED ORDER — LIDOCAINE HCL (CARDIAC) 20 MG/ML IV SOLN
INTRAVENOUS | Status: DC | PRN
  Administered 2016-01-16: 50 mg via INTRAVENOUS

## 2016-01-16 MED ORDER — SUGAMMADEX SODIUM 200 MG/2ML IV SOLN
INTRAVENOUS | Status: DC | PRN
  Administered 2016-01-16: 150 mg via INTRAVENOUS

## 2016-01-16 MED ORDER — OXYCODONE HCL 5 MG PO TABS: 5.0000 mg | ORAL_TABLET | Freq: Once | ORAL | Status: DC | PRN

## 2016-01-16 MED ORDER — ONDANSETRON HCL 4 MG/2ML IJ SOLN: 4.0000 mg | Freq: Once | INTRAMUSCULAR | Status: DC | PRN

## 2016-01-16 SURGICAL SUPPLY — 13 items
BAG SNAP BAND KOVER 36X36 (MISCELLANEOUS) ×3 IMPLANT
CATH BLAZERPRIME XP LG CV 10MM (ABLATOR) ×3 IMPLANT
CATH DUODECA/ISMUS 7FR REPROC (CATHETERS) ×3 IMPLANT
CATH OCTAPOLOR 6F 125CM 2-5-2 (CATHETERS) ×3 IMPLANT
CATH QUAD COURNAND 5FR REPROC (CATHETERS) ×3 IMPLANT
PACK EP LATEX FREE (CUSTOM PROCEDURE TRAY) ×2
PACK EP LF (CUSTOM PROCEDURE TRAY) ×1 IMPLANT
PAD DEFIB LIFELINK (PAD) ×3 IMPLANT
SHEATH ATRIAL FLUTTER SAFL 8F (SHEATH) ×3 IMPLANT
SHEATH PINNACLE 6F 10CM (SHEATH) ×3 IMPLANT
SHEATH PINNACLE 7F 10CM (SHEATH) ×3 IMPLANT
SHEATH PINNACLE 8F 10CM (SHEATH) ×6 IMPLANT
SHIELD RADPAD SCOOP 12X17 (MISCELLANEOUS) ×3 IMPLANT

## 2016-01-16 NOTE — H&P (Signed)
      Patient Care Team: [facility] as PCP - General   HPI  [name] is a 65 y.o. female     Past Medical History:  Diagnosis Date  . Atrial flutter (HCC) 09/2015   a. CHADS2VASc 2 (HTN, female); b. on Eliquis; c. echo 7/17: EF 50-55%, no RWMA, rhythm atrial flutter, mildly dilated LA/RV, moderately to severely dilated RA, trivial pericardial effusion  . Essential hypertension   . GERD (gastroesophageal reflux disease)   . Subclinical hypothyroidism   . Tobacco abuse     Past Surgical History:  Procedure Laterality Date  . ABDOMINAL HYSTERECTOMY    . ELECTROPHYSIOLOGIC STUDY N/A 11/11/2015   Procedure: CARDIOVERSION;  Surgeon: Antonieta Ibaimothy J Gollan, MD;  Location: [facility] ORS;  Service: Cardiovascular;  Laterality: N/A;    Current Facility-Administered Medications  Medication Dose Route Frequency Provider Last Rate Last Dose  . 0.9 %  sodium chloride infusion   Intravenous Continuous Marily LenteAmber K Seiler, NP 50 mL/hr at 01/16/16 0616      Allergies  Allergen Reactions  . Amoxicillin Itching    Has patient had a PCN reaction causing immediate rash, facial/tongue/throat swelling, SOB or lightheadedness with hypotension: Yes Has patient had a PCN reaction causing severe rash involving mucus membranes or skin necrosis: No Has patient had a PCN reaction that required hospitalization No Has patient had a PCN reaction occurring within the last 10 years: Yes If all of the above answers are "NO", then may proceed with Cephalosporin use.    Social History  Substance Use Topics  . Smoking status: Former Smoker    Quit date: 11/19/2015  . Smokeless tobacco: Never Used  . Alcohol use Yes     Comment: occasionally   Family History  Problem Relation Age of Onset  . Hypertension Mother   . Diabetes Mother   . Diabetes Father    No current facility-administered medications on file prior to encounter.    Current Outpatient Prescriptions on File Prior to Encounter    Medication Sig Dispense Refill  . amLODipine (NORVASC) 10 MG tablet Take 1 tablet (10 mg total) by mouth daily. 90 tablet 3  . apixaban (ELIQUIS) 5 MG TABS tablet Take 1 tablet (5 mg total) by mouth 2 (two) times daily. 180 tablet 3  . levothyroxine (SYNTHROID, LEVOTHROID) 88 MCG tablet Take 88 mcg by mouth daily before breakfast.     . lisinopril (PRINIVIL,ZESTRIL) 20 MG tablet Take 1 tablet (20 mg total) by mouth 2 (two) times daily. 180 tablet 3      Review of Systems negative except from HPI and PMH  Physical Exam BP 134/71   Pulse (!) 55   Temp 98.5 F (36.9 C)   Resp 18   Ht 5\' 5"  (1.651 m)   Wt 160 lb (72.6 kg)   SpO2 100%   BMI 26.63 kg/m  Well developed and well nourished in no acute distress HENT normal E scleral and icterus clear Neck Supple JVP flat; carotids brisk and full Clear to ausculation  *Regular rate and rhythm, no murmurs gallops or rub Soft with active bowel sounds No clubbing cyanosis  Edema Alert and oriented, grossly normal motor and sensory function Skin Warm and Dry    Assessment and  Plan Atrial flutter-persistent holding sinus  Hypertension  Hypothyroidism-treated  Cigarette abuse-stopped YEAH  Sinus bradycardia-asymptomatic   For flutter ablation today   Reviewed risk and benefits

## 2016-01-16 NOTE — Progress Notes (Signed)
Pt informed that she was possibly able to go home this evening. Bedrest finished at 5. I got her up and pt was only able to walk 4 steps due to the pain in her right leg/groin. I paged Vin, PA and notified him. He stated that patient wont be able to go home this evening and orders for CBC in the am. Pts left and right groin assessments are unchanged. Will continue to monitor. Pt informed and ok with plan

## 2016-01-16 NOTE — Discharge Instructions (Signed)
No driving for 1 week. No lifting over 5 lbs for 1 week. No vigorous or sexual activity for 1 week. You may return to work on 01/24/16. Keep procedure site clean & dry. If you notice increased pain, swelling, bleeding or pus, call/return!  You may shower, but no soaking baths/hot tubs/pools for 1 week.

## 2016-01-16 NOTE — Transfer of Care (Signed)
Immediate Anesthesia Transfer of Care Note  Patient: Kathlen ModyWanda S Caton  Procedure(s) Performed: Procedure(s): A-Flutter Ablation (N/A)  Patient Location: Cath Lab  Anesthesia Type:General  Level of Consciousness: awake, alert  and oriented  Airway & Oxygen Therapy: Patient Spontanous Breathing and Patient connected to nasal cannula oxygen  Post-op Assessment: Report given to RN and Post -op Vital signs reviewed and stable  Post vital signs: Reviewed and stable  Last Vitals:  Vitals:   01/16/16 0533  BP: 134/71  Pulse: (!) 55  Resp: 18  Temp: 36.9 C    Last Pain: There were no vitals filed for this visit.       Complications: No apparent anesthesia complications

## 2016-01-16 NOTE — Progress Notes (Addendum)
Received pt from procedure with a level 2 hematoma.  Manual pressure held for 15 mins. Right groin sore to touch but down to level 1.  Will monitor site for 10 mins.

## 2016-01-16 NOTE — Anesthesia Preprocedure Evaluation (Addendum)
Anesthesia Evaluation  Patient identified by MRN, date of birth, ID band Patient awake    Reviewed: Allergy & Precautions, NPO status , Patient's Chart, lab work & pertinent test results  Airway Mallampati: II  TM Distance: >3 FB Neck ROM: Full    Dental  (+) Teeth Intact, Dental Advisory Given,    Pulmonary former smoker,    breath sounds clear to auscultation       Cardiovascular hypertension,  Rhythm:Regular Rate:Normal     Neuro/Psych    GI/Hepatic   Endo/Other    Renal/GU      Musculoskeletal   Abdominal   Peds  Hematology   Anesthesia Other Findings   Reproductive/Obstetrics                             Anesthesia Physical Anesthesia Plan  ASA: III  Anesthesia Plan: General   Post-op Pain Management:    Induction: Intravenous  Airway Management Planned: LMA  Additional Equipment:   Intra-op Plan:   Post-operative Plan:   Informed Consent:   Dental advisory given  Plan Discussed with: CRNA and Anesthesiologist  Anesthesia Plan Comments:        Anesthesia Quick Evaluation

## 2016-01-16 NOTE — Anesthesia Postprocedure Evaluation (Signed)
Anesthesia Post Note  Patient: Kathlen ModyWanda S Paragas  Procedure(s) Performed: Procedure(s) (LRB): A-Flutter Ablation (N/A)  Patient location during evaluation: Cath Lab Anesthesia Type: General Level of consciousness: awake, awake and alert and oriented Pain management: pain level controlled Vital Signs Assessment: post-procedure vital signs reviewed and stable Respiratory status: spontaneous breathing, nonlabored ventilation and respiratory function stable Cardiovascular status: blood pressure returned to baseline Anesthetic complications: no    Last Vitals:  Vitals:   01/16/16 1100 01/16/16 1114  BP: (!) 117/41 (!) 144/79  Pulse: (!) 51 60  Resp: 14 13  Temp:  36.6 C    Last Pain:  Vitals:   01/16/16 1114  TempSrc: Oral                 Ayomikun Starling COKER

## 2016-01-16 NOTE — Anesthesia Procedure Notes (Signed)
Procedure Name: Intubation Date/Time: 01/16/2016 7:53 AM Performed by: Jed LimerickHARDER, Geoffrey Hynes S Pre-anesthesia Checklist: Patient identified, Emergency Drugs available, Suction available and Patient being monitored Patient Re-evaluated:Patient Re-evaluated prior to inductionOxygen Delivery Method: Circle System Utilized Preoxygenation: Pre-oxygenation with 100% oxygen Intubation Type: IV induction Ventilation: Mask ventilation without difficulty Laryngoscope Size: Mac and 3 Grade View: Grade I Tube type: Oral Tube size: 7.0 mm Number of attempts: 1 Airway Equipment and Method: Stylet Placement Confirmation: ETT inserted through vocal cords under direct vision,  positive ETCO2 and breath sounds checked- equal and bilateral Secured at: 22 cm Tube secured with: Tape Dental Injury: Teeth and Oropharynx as per pre-operative assessment

## 2016-01-16 NOTE — Progress Notes (Signed)
Pt still complaining of soreness in her right groin that is unchanged from when the patient came from the cath lab. However, I wanted to get a second opinion because I felt like the right groin was a bit tighter at the site of the right groin. 2 Cath lab RNs came up to assess the site and felt that although site was tender and sore to patient, it was WNL. Distal pulse good on both extremities and equal. Will continue to monitor

## 2016-01-17 DIAGNOSIS — I483 Typical atrial flutter: Secondary | ICD-10-CM | POA: Diagnosis not present

## 2016-01-17 DIAGNOSIS — I10 Essential (primary) hypertension: Secondary | ICD-10-CM | POA: Diagnosis not present

## 2016-01-17 DIAGNOSIS — I4892 Unspecified atrial flutter: Secondary | ICD-10-CM | POA: Diagnosis not present

## 2016-01-17 DIAGNOSIS — E039 Hypothyroidism, unspecified: Secondary | ICD-10-CM | POA: Diagnosis not present

## 2016-01-17 DIAGNOSIS — R001 Bradycardia, unspecified: Secondary | ICD-10-CM | POA: Diagnosis not present

## 2016-01-17 LAB — CBC
HEMATOCRIT: 35.5 % — AB (ref 36.0–46.0)
Hemoglobin: 11.1 g/dL — ABNORMAL LOW (ref 12.0–15.0)
MCH: 23.3 pg — AB (ref 26.0–34.0)
MCHC: 31.3 g/dL (ref 30.0–36.0)
MCV: 74.4 fL — AB (ref 78.0–100.0)
Platelets: 160 10*3/uL (ref 150–400)
RBC: 4.77 MIL/uL (ref 3.87–5.11)
RDW: 15.5 % (ref 11.5–15.5)
WBC: 7.3 10*3/uL (ref 4.0–10.5)

## 2016-01-17 NOTE — Discharge Summary (Signed)
Discharge Summary    Patient ID: [name],  MRN: [id number], DOB/AGE: [birth year] 65 y.o.  Admit date: 01/16/2016 Discharge date: 01/17/2016  [facility] Primary Cardiologist: Dr. Alvino ChapelIngal EP: Dr. Graciela HusbandsKlein  Discharge Diagnoses    Active Problems:   Atrial flutter (HCC)   Allergies Allergies  Allergen Reactions  . Amoxicillin Itching    Has patient had a PCN reaction causing immediate rash, facial/tongue/throat swelling, SOB or lightheadedness with hypotension: Yes Has patient had a PCN reaction causing severe rash involving mucus membranes or skin necrosis: No Has patient had a PCN reaction that required hospitalization No Has patient had a PCN reaction occurring within the last 10 years: Yes If all of the above answers are "NO", then may proceed with Cephalosporin use.     Diagnostic Studies/Procedures    Successful catheter ablation  01/16/16  History of Present Illness     Pamela ModyWanda S Spease is a 65 y.o. female with hx of atrial flutter with variable conduction,  HTN, GERD, hypothyroidism and tobacco abuse who presented for ablation.   Since her cardioversion 11/11/2015, patient has noticed an improvement in symptomatology with a change in rhythm. She has since stopped taking her metoprolol because of the bradycardia. There is no episode of lightheadedness or passing out.   Since increasing dose of amlodipine by Dr. Graciela HusbandsKlein, blood pressure has improved.   Hospital Course     Consultants: None  The patient has successful S/p ablation of the atrial flutter isthmus. Patient had level 2 hematoma at R groin cath site that improved with pressure. Pain and unable to ambulate requiring overnight observation. Cath site without hematoma or bruise. HGb stable. Groin pain improved. Advised to take PRN tylenol. She will call us if worsening of symptoms. Maintained sinus rhythm at rate of 49 bpm.   The patient has been seen by Dr. Ladona Ridgelaylor  today and deemed ready for discharge home. All follow-up appointments have been scheduled. Discharge medications are listed below.    Discharge Vitals Blood pressure 109/67, pulse 77, temperature 98.2 F (36.8 C), temperature source Oral, resp. rate 16, height 5\' 5"  (1.651 m), weight 166 lb 9.6 oz (75.6 kg), SpO2 99 %.  Filed Weights   01/16/16 0533 01/17/16 0555  Weight: 160 lb (72.6 kg) 166 lb 9.6 oz (75.6 kg)    Labs & Radiologic Studies     CBC  Recent Labs  01/17/16 0511  WBC 7.3  HGB 11.1*  HCT 35.5*  MCV 74.4*  PLT 160   Basic Metabolic Panel No results for input(s): NA, K, CL, CO2, GLUCOSE, BUN, CREATININE, CALCIUM, MG, PHOS in the last 72 hours. Liver Function Tests No results for input(s): AST, ALT, ALKPHOS, BILITOT, PROT, ALBUMIN in the last 72 hours. No results for input(s): LIPASE, AMYLASE in the last 72 hours. Cardiac Enzymes No results for input(s): CKTOTAL, CKMB, CKMBINDEX, TROPONINI in the last 72 hours. BNP Invalid input(s): POCBNP D-Dimer No results for input(s): DDIMER in the last 72 hours. Hemoglobin A1C No results for input(s): HGBA1C in the last 72 hours. Fasting Lipid Panel No results for input(s): CHOL, HDL, LDLCALC, TRIG, CHOLHDL, LDLDIRECT in the last 72 hours. Thyroid Function Tests No results for input(s): TSH, T4TOTAL, T3FREE, THYROIDAB in the last 72 hours.  Invalid input(s): FREET3  No results found.  Disposition   Pt is being discharged home today in good condition.  Follow-up Plans & Appointments    Follow-up Information    Viviann SpareSteven  Graciela HusbandsKlein, MD Follow up on 02/12/2016.   Specialty:  Cardiology Why:  10:15AM Contact information: [address] [place] [id number]          Discharge Instructions    Diet - low sodium heart healthy    Complete by:  As directed    Increase activity slowly    Complete by:  As directed       Discharge Medications   Current Discharge Medication List      CONTINUE these medications which have NOT CHANGED   Details  acetaminophen (TYLENOL) 500 MG tablet Take 500 mg by mouth every 6 (six) hours as needed for moderate pain or headache.    amLODipine (NORVASC) 10 MG tablet Take 1 tablet (10 mg total) by mouth daily. Qty: 90 tablet, Refills: 3    apixaban (ELIQUIS) 5 MG TABS tablet Take 1 tablet (5 mg total) by mouth 2 (two) times daily. Qty: 180 tablet, Refills: 3    cholecalciferol (VITAMIN D) 1000 units tablet Take 1,000 Units by mouth daily.    levothyroxine (SYNTHROID, LEVOTHROID) 88 MCG tablet Take 88 mcg by mouth daily before breakfast.     lisinopril (PRINIVIL,ZESTRIL) 20 MG tablet Take 1 tablet (20 mg total) by mouth 2 (two) times daily. Qty: 180 tablet, Refills: 3          Outstanding Labs/Studies   None  Duration of Discharge Encounter   Greater than 30 minutes including physician time.  Signed, Bhagat,Bhavinkumar PA-C 01/17/2016, 9:18 AM  EP Attending   Patient seen and examined. Agree with the findings as noted above. He is stable for DC home.   Leonia ReevesGregg Taylor,M.D.

## 2016-01-17 NOTE — Progress Notes (Signed)
Pt d/c home, in no acute distress.  Family at bedside.  Eliquis to be resumed at d/c.  Groin care/discharge instructions reviewed in detail with pt and family.  VSS, pt in no acute distress.

## 2016-01-17 NOTE — Op Note (Signed)
[name], [name]               ACCOUNT NO.:  [id number]  MEDICAL RECORD NO.:  [id number]  LOCATION:  [id number]                        FACILITY:  [facility]  PHYSICIAN:  Pamela SalviaSteven C. Klein, MD, FACCDATE OF BIRTH:  1950/07/28  DATE OF PROCEDURE: DATE OF DISCHARGE:                              OPERATIVE REPORT   PREOPERATIVE DIAGNOSIS:  Atrial flutter.  POSTOPERATIVE DIAGNOSIS:  Atrial flutter.  PROCEDURES:  Invasive electrophysiological study and RF catheter ablation.  DESCRIPTION OF PROCEDURE:  Following obtaining informed consent, the patient was brought to electrophysiology laboratory and placed on the fluoroscopic table in supine position.  After routine prep and drape, cardiac catheterization was performed under general anesthesia under the care of Dr. Noreene LarssonJoslin.  Following the procedure, the catheters were removed.  The sheaths were removed.  The patient was transferred to the holding area in stable condition.  CATHETERS:  A 5-French quadripolar catheter was inserted via the left femoral vein to the AV junction. A 6-French octapolar catheter was inserted via the right femoral vein to the coronary sinus. A 7-French dual decapolar catheter was inserted via the left femoral vein to the tricuspid anulus. An 8-French 8-mm deflectable tip catheter was inserted via the right femoral vein to mapping sites and posterior septal space.  SURFACE LEADS:  1, AVF, and V1 were monitored continuously throughout the procedure.  Following insertion of the catheters, a stimulation protocol included incremental atrial pacing. Incremental ventricular pacing. A single atrial extrastimuli paced at a cycle length of 600 milliseconds.  END-TIDAL RESULTS:  End-tidal electrocardiogram and basic intervals:  Initial.  Rhythm, sinus; RR interval of 1162 millisecond; PR interval of 178 millisecond; P-wave duration is 162 millisecond; QRS duration 104 millisecond; QT interval 452 millisecond; AH interval 88  millisecond; HV interval 52 milliseconds.  Final:  Sinus; RR interval 915 millisecond; PR interval 190 millisecond; P-wave duration is 170 millisecond; QRS duration is 92 millisecond; QTc interval 478 milliseconds. AH interval 66 millisecond. HV interval 68 milliseconds.  AV NODAL FUNCTION:  AV Wenckebach was 450 milliseconds. VA Wenckebach was 380 millisecond. AV nodal ERP was 600 milliseconds/280 milliseconds.  Dual AV nodal physiology was not present.  ACCESSORY PATHWAY:  No evidence of accessory pathway was identified.  VENTRICULAR RESPONSE PROGRAMMED STIMULATION:  Was normal as described above.  Arrhythmias:  Induced none.  The patient presented in sinus rhythm.  The patient was subjected to CS pacing with elimination of bidirectional transisthmus conduction with radiowave energy.  Radiofrequency energy:  A total of 5.8 minutes of RF energy was applied across the cavotricuspid isthmus, resulted in bidirectional cavotricuspid isthmus conduction block.  FLUOROSCOPY TIME:  A total of 5.7 minutes of fluoro time was utilized.  IMPRESSION: 1. Normal sinus function apart from bradycardia. 2. Abnormal atrial function manifested by sustained atrial flutter. 3. Normal AV nodal function. 4. Normal His-Purkinje system function. 5. No accessory pathway. 6. Normal response to ventricular stimulation.  SUMMARY:  In conclusion, the results of electrophysiological testing confirmed cavotricuspid isthmus conduction as the likely substrate of the patient's typical atrial flutter.  Cavotricuspid isthmus conduction was interrupted bidirectionally with RF energy.  The patient tolerated the procedure well without apparent complication.  The patient will be reobserved and  then discharged with 48 hours more of anticoagulation and then subsequent discontinuation.     Pamela SalviaSteven C. Klein, MD, [facility]     SCK/MEDQ  D:  01/16/2016  T:  01/17/2016  Job:  [id number]

## 2016-01-17 NOTE — Progress Notes (Signed)
    Patient Name: Kathlen ModyWanda S Thielman Date of Encounter: 01/17/2016     Active Problems:   Atrial flutter (HCC)    SUBJECTIVE  No chest pain or sob.   CURRENT MEDS . amLODipine  10 mg Oral Daily  . cholecalciferol  1,000 Units Oral Daily  . levothyroxine  88 mcg Oral QAC breakfast  . lisinopril  20 mg Oral BID  . sodium chloride flush  3 mL Intravenous Q12H    OBJECTIVE  Vitals:   01/16/16 1445 01/16/16 1500 01/16/16 2004 01/17/16 0555  BP: (!) 107/56  113/74 109/67  Pulse:    77  Resp:    16  Temp:  98.2 F (36.8 C)  98.2 F (36.8 C)  TempSrc:  Oral  Oral  SpO2:    99%  Weight:    166 lb 9.6 oz (75.6 kg)  Height:        Intake/Output Summary (Last 24 hours) at 01/17/16 0753 Last data filed at 01/16/16 2015  Gross per 24 hour  Intake             1480 ml  Output             1540 ml  Net              -60 ml   Filed Weights   01/16/16 0533 01/17/16 0555  Weight: 160 lb (72.6 kg) 166 lb 9.6 oz (75.6 kg)    PHYSICAL EXAM  General: Pleasant, NAD. Neuro: Alert and oriented X 3. Moves all extremities spontaneously. Psych: Normal affect. HEENT:  Normal  Neck: Supple without bruits or JVD. Lungs:  Resp regular and unlabored, CTA. Heart: RRR no s3, s4, or murmurs. Abdomen: Soft, non-tender, non-distended, BS + x 4.  Extremities: No clubbing, cyanosis or edema. DP/PT/Radials 2+ and equal bilaterally. Right and left groin with no hematoma  Accessory Clinical Findings  CBC  Recent Labs  01/17/16 0511  WBC 7.3  HGB 11.1*  HCT 35.5*  MCV 74.4*  PLT 160   Basic Metabolic Panel No results for input(s): NA, K, CL, CO2, GLUCOSE, BUN, CREATININE, CALCIUM, MG, PHOS in the last 72 hours. Liver Function Tests No results for input(s): AST, ALT, ALKPHOS, BILITOT, PROT, ALBUMIN in the last 72 hours. No results for input(s): LIPASE, AMYLASE in the last 72 hours. Cardiac Enzymes No results for input(s): CKTOTAL, CKMB, CKMBINDEX, TROPONINI in the last 72  hours. BNP Invalid input(s): POCBNP D-Dimer No results for input(s): DDIMER in the last 72 hours. Hemoglobin A1C No results for input(s): HGBA1C in the last 72 hours. Fasting Lipid Panel No results for input(s): CHOL, HDL, LDLCALC, TRIG, CHOLHDL, LDLDIRECT in the last 72 hours. Thyroid Function Tests No results for input(s): TSH, T4TOTAL, T3FREE, THYROIDAB in the last 72 hours.  Invalid input(s): FREET3  TELE Nsr/sinus brady  Radiology/Studies  No results found.  ASSESSMENT AND PLAN  1. Atrial flutter 2. S/p ablation of the atrial flutter isthmus Rec: she is stable for DC home. Continue all outpatient meds. Followup Dr. Arta SilenceSK.  Ranell Skibinski,M.D.  01/17/2016 7:53 AMPatient ID: Kathlen ModyWanda S Johnsey, female   DOB: Feb 26, 1951, 65 y.o.   MRN: [id number]

## 2016-01-28 ENCOUNTER — Telehealth: Payer: Self-pay | Admitting: Cardiology

## 2016-01-28 NOTE — Telephone Encounter (Signed)
Received records request From Disability Determination Services , [facility]

## 2016-02-12 ENCOUNTER — Ambulatory Visit (INDEPENDENT_AMBULATORY_CARE_PROVIDER_SITE_OTHER): Payer: Medicaid Other | Admitting: Internal Medicine

## 2016-02-12 ENCOUNTER — Encounter: Payer: Self-pay | Admitting: Internal Medicine

## 2016-02-12 VITALS — BP 118/70 | HR 48 | Ht 65.0 in | Wt 167.5 lb

## 2016-02-12 DIAGNOSIS — I4892 Unspecified atrial flutter: Secondary | ICD-10-CM | POA: Diagnosis not present

## 2016-02-12 DIAGNOSIS — I10 Essential (primary) hypertension: Secondary | ICD-10-CM | POA: Diagnosis not present

## 2016-02-12 DIAGNOSIS — R001 Bradycardia, unspecified: Secondary | ICD-10-CM | POA: Diagnosis not present

## 2016-02-12 MED ORDER — LISINOPRIL 20 MG PO TABS: 20.0000 mg | ORAL_TABLET | Freq: Every day | ORAL | Status: DC

## 2016-02-12 NOTE — Patient Instructions (Signed)
Medication Instructions: - Your physician has recommended you make the following change in your medication:  1) Stop Eliquis 2) Decrease lisinopril to 20 mg one tablet by mouth once daily  Labwork: - none ordered  Procedures/Testing: - none ordered  Follow-Up: - Dr. Graciela HusbandsKlein will see you back on an as needed basis.  Any Additional Special Instructions Will Be Listed Below (If Applicable).     If you need a refill on your cardiac medications before your next appointment, please call your pharmacy.

## 2016-02-12 NOTE — Progress Notes (Signed)
Patient Care Team: [facility] - General   HPI  [name] is a 65 y.o. female Seen in follow-up for atrial flutter for which she underwent  11/17  catheter ablation.    She has stopped smoking hooray and feels better  Now 67 days  The patient denies chest pain, shortness of breath, nocturnal dyspnea, orthopnea or peripheral edema.  There have been no palpitations, lightheadedness or syncope.     Eval 7/17  Echo  LVH 15/13 LAE  (46/2.5/42) 7/17  Myoview  EF 47% no ischemia  Records and Results Reviewed Holter monitor demonstrated sinus rhythm with frequent atrial ectopy     Past Medical History:  Diagnosis Date  . Atrial flutter (HCC) 09/2015   a. CHADS2VASc 2 (HTN, female); b. on Eliquis; c. echo 7/17: EF 50-55%, no RWMA, rhythm atrial flutter, mildly dilated LA/RV, moderately to severely dilated RA, trivial pericardial effusion  . Essential hypertension   . GERD (gastroesophageal reflux disease)   . Subclinical hypothyroidism   . Tobacco abuse     Past Surgical History:  Procedure Laterality Date  . ABDOMINAL HYSTERECTOMY    . ABLATION OF DYSRHYTHMIC FOCUS  01/16/2016  . ELECTROPHYSIOLOGIC STUDY N/A 11/11/2015   Procedure: CARDIOVERSION;  Surgeon: Antonieta Ibaimothy J Gollan, MD;  Location: [facility] ORS;  Service: Cardiovascular;  Laterality: N/A;  . ELECTROPHYSIOLOGIC STUDY N/A 01/16/2016   Procedure: A-Flutter Ablation;  Surgeon: Duke SalviaSteven C Dedee Liss, MD;  [facility];  Service: Cardiovascular;  Laterality: N/A;    Current Outpatient Prescriptions  Medication Sig Dispense Refill  . acetaminophen (TYLENOL) 500 MG tablet Take 500 mg by mouth every 6 (six) hours as needed for moderate pain or headache.    Marland Kitchen. amLODipine (NORVASC) 10 MG tablet Take 1 tablet (10 mg total) by mouth daily. 90 tablet 3  . apixaban (ELIQUIS) 5 MG TABS tablet Take 1 tablet (5 mg total) by mouth 2 (two) times daily. 180 tablet 3  . cholecalciferol (VITAMIN D) 1000  units tablet Take 1,000 Units by mouth daily.    Marland Kitchen. levothyroxine (SYNTHROID, LEVOTHROID) 88 MCG tablet Take 88 mcg by mouth daily before breakfast.     . lisinopril (PRINIVIL,ZESTRIL) 20 MG tablet Take 1 tablet (20 mg total) by mouth 2 (two) times daily. 180 tablet 3   No current facility-administered medications for this visit.     Allergies  Allergen Reactions  . Amoxicillin Itching    Has patient had a PCN reaction causing immediate rash, facial/tongue/throat swelling, SOB or lightheadedness with hypotension: Yes Has patient had a PCN reaction causing severe rash involving mucus membranes or skin necrosis: No Has patient had a PCN reaction that required hospitalization No Has patient had a PCN reaction occurring within the last 10 years: Yes If all of the above answers are "NO", then may proceed with Cephalosporin use.       Review of Systems negative except from HPI and PMH  Physical Exam BP 118/70 (BP Location: Left Arm, Patient Position: Sitting, Cuff Size: Normal)   Pulse (!) 48   Ht 5\' 5"  (1.651 m)   Wt 167 lb 8 oz (76 kg)   BMI 27.87 kg/m  Well developed and well nourished in no acute distress HENT normal E scleral and icterus clear Neck Supple JVP flat; carotids brisk and full Clear to ausculation  regular rate and rhythm 2/6 M Soft with active bowel sounds No clubbing cyanosis  Edema Alert and oriented, grossly  normal motor and sensory function Skin Warm and Dry  ECG demonstrates sinus at 48 17/10/45  Assessment and  Plan Atrial flutter-persistent s/p ablation   Hypertension  Hypothyroidism-treated  Cigarette abuse-stopped YEAH  Sinus bradycardia  No symptoms with bradycardia;  I told her she may need a pacemaker in her distant future  .  Will stop apixoban   BP at home are 110-120>> will decrease lisinopril 20 bid>>>daily  Will see prn

## 2016-02-20 ENCOUNTER — Emergency Department
Admission: EM | Admit: 2016-02-20 | Discharge: 2016-02-20 | Disposition: A | Discharge status: Home / Self Care | Payer: Medicaid Other | Attending: Emergency Medicine | Admitting: Emergency Medicine

## 2016-02-20 ENCOUNTER — Encounter: Payer: Self-pay | Admitting: Emergency Medicine

## 2016-02-20 DIAGNOSIS — Z87891 Personal history of nicotine dependence: Secondary | ICD-10-CM | POA: Insufficient documentation

## 2016-02-20 DIAGNOSIS — T783XXA Angioneurotic edema, initial encounter: Secondary | ICD-10-CM | POA: Insufficient documentation

## 2016-02-20 DIAGNOSIS — Z79899 Other long term (current) drug therapy: Secondary | ICD-10-CM | POA: Diagnosis not present

## 2016-02-20 DIAGNOSIS — R220 Localized swelling, mass and lump, head: Secondary | ICD-10-CM | POA: Diagnosis present

## 2016-02-20 DIAGNOSIS — I10 Essential (primary) hypertension: Secondary | ICD-10-CM | POA: Insufficient documentation

## 2016-02-20 LAB — CBC WITH DIFFERENTIAL/PLATELET
Basophils Absolute: 0 10*3/uL (ref 0–0.1)
Basophils Relative: 0 %
EOS PCT: 2 %
Eosinophils Absolute: 0.1 10*3/uL (ref 0–0.7)
HCT: 38.5 % (ref 35.0–47.0)
Hemoglobin: 12.3 g/dL (ref 12.0–16.0)
LYMPHS ABS: 1.3 10*3/uL (ref 1.0–3.6)
LYMPHS PCT: 19 %
MCH: 23.5 pg — AB (ref 26.0–34.0)
MCHC: 31.9 g/dL — ABNORMAL LOW (ref 32.0–36.0)
MCV: 73.7 fL — AB (ref 80.0–100.0)
MONO ABS: 0.6 10*3/uL (ref 0.2–0.9)
Monocytes Relative: 9 %
Neutro Abs: 4.7 10*3/uL (ref 1.4–6.5)
Neutrophils Relative %: 70 %
PLATELETS: 203 10*3/uL (ref 150–440)
RBC: 5.22 MIL/uL — ABNORMAL HIGH (ref 3.80–5.20)
RDW: 16.2 % — AB (ref 11.5–14.5)
WBC: 6.8 10*3/uL (ref 3.6–11.0)

## 2016-02-20 LAB — BASIC METABOLIC PANEL
Anion gap: 4 — ABNORMAL LOW (ref 5–15)
BUN: 18 mg/dL (ref 6–20)
CHLORIDE: 109 mmol/L (ref 101–111)
CO2: 27 mmol/L (ref 22–32)
Calcium: 9.4 mg/dL (ref 8.9–10.3)
Creatinine, Ser: 0.66 mg/dL (ref 0.44–1.00)
GFR calc Af Amer: >60 mL/min (ref >60)
GFR calc non Af Amer: >60 mL/min (ref >60)
GLUCOSE: 93 mg/dL (ref 65–99)
POTASSIUM: 3.8 mmol/L (ref 3.5–5.1)
Sodium: 140 mmol/L (ref 135–145)

## 2016-02-20 MED ORDER — RANITIDINE HCL 50 MG/2ML IJ SOLN
50.0000 mg | Freq: Once | INTRAVENOUS | Status: AC
  Administered 2016-02-20: 50 mg via INTRAVENOUS
  Filled 2016-02-20: qty 2

## 2016-02-20 MED ORDER — HYDROCHLOROTHIAZIDE 25 MG PO TABS: 25.0000 mg | ORAL_TABLET | Freq: Every day | ORAL | 0 refills | Status: DC

## 2016-02-20 MED ORDER — DEXAMETHASONE SODIUM PHOSPHATE 4 MG/ML IJ SOLN
INTRAMUSCULAR | Status: AC
  Administered 2016-02-20: 10 mg via INTRAVENOUS
  Filled 2016-02-20: qty 3

## 2016-02-20 MED ORDER — PREDNISONE 10 MG PO TABS: ORAL_TABLET | ORAL | 0 refills | Status: DC

## 2016-02-20 MED ORDER — DEXAMETHASONE SODIUM PHOSPHATE 4 MG/ML IJ SOLN
10.0000 mg | Freq: Once | INTRAMUSCULAR | Status: AC
  Administered 2016-02-20: 10 mg via INTRAVENOUS

## 2016-02-20 MED ORDER — RANITIDINE HCL 150 MG PO TABS: 150.0000 mg | ORAL_TABLET | Freq: Two times a day (BID) | ORAL | 1 refills | Status: DC

## 2016-02-20 MED ORDER — DIPHENHYDRAMINE HCL 50 MG/ML IJ SOLN
25.0000 mg | Freq: Once | INTRAMUSCULAR | Status: AC
  Administered 2016-02-20: 25 mg via INTRAVENOUS
  Filled 2016-02-20: qty 1

## 2016-02-20 NOTE — ED Notes (Signed)
See triage note   States she noticed some swelling to upper lip this am.   The swelling has become worse thur out the morning   No resp distress speech is clear  Was started on lisinopril about 5 months ago

## 2016-02-20 NOTE — ED Notes (Signed)
Pts upper lip remains swollen at this time. Airway has no swelling and pt denies SOB. PT in NAD.

## 2016-02-20 NOTE — ED Notes (Signed)
Pressure held to IV site until bleeding was noted to have stopped. No bruseing noted to the area. Pt verbalized understanding to keep bandage on site until home to prevent further bleeding.

## 2016-02-20 NOTE — Discharge Instructions (Signed)
Discontinue taking lisinopril. Call your  doctor on Monday to discuss this with them. In the meantime a prescription for hydrochlorothiazide 25 mg one daily. You May continue your other medications as prescribed by your doctor. Also start on prednisone as directed beginning with 6 tablets tomorrow and tapering down as instructed. Zantac 1 tablet twice a day until you talk to your physician. Return to the emergency room if any urgent concerns or worsening of your symptoms.

## 2016-02-20 NOTE — ED Triage Notes (Signed)
Pt states she woke up with  Lip swelling this am. Pt denies any allergies and or resp distress. Unsure if she was bitten by a bug. NAD, skin warm and dry,

## 2016-02-20 NOTE — ED Provider Notes (Signed)
[facility] Emergency Department Provider Note  ____________________________________________   First MD Initiated Contact with Patient 02/20/16 1339     (approximate)  I have reviewed the triage vital signs and the nursing notes.   HISTORY  Chief Complaint Oral Swelling (top lip left side)    HPI [name] is a 65 y.o. female is here with complaint of left sided upper lip swollen. Patient states that this occurred this morning. She denies any difficulty breathing, swallowing, speaking. Patient has not experienced this before. Patient states that she was on lisinopril for 5 months and was taking 2 tablets.  She states that her doctor recently decreased her medication from 2 tablets to one tablet daily. She also takes amlodipine and Synthroid. Patient denies any pain and states that is just a "weird sensation".   Past Medical History:  Diagnosis Date  . Atrial flutter (HCC) 09/2015   a. CHADS2VASc 2 (HTN, female); b. on Eliquis; c. echo 7/17: EF 50-55%, no RWMA, rhythm atrial flutter, mildly dilated LA/RV, moderately to severely dilated RA, trivial pericardial effusion  . Essential hypertension   . GERD (gastroesophageal reflux disease)   . Subclinical hypothyroidism   . Tobacco abuse     Patient Active Problem List   Diagnosis Date Noted  . Atrial flutter (HCC) 01/16/2016  . SOB (shortness of breath)   . Subclinical hypothyroidism   . Typical atrial flutter (HCC) 09/24/2015  . Essential hypertension 09/23/2015  . Tobacco abuse 09/23/2015    Past Surgical History:  Procedure Laterality Date  . ABDOMINAL HYSTERECTOMY    . ABLATION OF DYSRHYTHMIC FOCUS  01/16/2016  . ELECTROPHYSIOLOGIC STUDY N/A 11/11/2015   Procedure: CARDIOVERSION;  Surgeon: Antonieta Ibaimothy J Gollan, MD;  Location: [facility] ORS;  Service: Cardiovascular;  Laterality: N/A;  . ELECTROPHYSIOLOGIC STUDY N/A 01/16/2016   Procedure: A-Flutter Ablation;  Surgeon: Duke SalviaSteven C Klein, MD;  [facility]  [facility];  Service: Cardiovascular;  Laterality: N/A;    Prior to Admission medications   Medication Sig Start Date End Date Taking? Authorizing Provider  acetaminophen (TYLENOL) 500 MG tablet Take 500 mg by mouth every 6 (six) hours as needed for moderate pain or headache.    Historical Provider, MD  amLODipine (NORVASC) 10 MG tablet Take 1 tablet (10 mg total) by mouth daily. 12/11/15 03/10/16  Duke SalviaSteven C Klein, MD  cholecalciferol (VITAMIN D) 1000 units tablet Take 1,000 Units by mouth daily.    Historical Provider, MD  levothyroxine (SYNTHROID, LEVOTHROID) 88 MCG tablet Take 88 mcg by mouth daily before breakfast.  10/24/15 10/23/16  Historical Provider, MD  lisinopril (PRINIVIL,ZESTRIL) 20 MG tablet Take 1 tablet (20 mg total) by mouth daily. 02/12/16   Duke SalviaSteven C Klein, MD  predniSONE (DELTASONE) 10 MG tablet Take 6 tablets  today, on day 2 take 5 tablets, day 3 take 4 tablets, day 4 take 3 tablets, day 5 take  2 tablets and 1 tablet the last day 02/20/16   Tommi Rumpshonda L Summers, PA-C  ranitidine (ZANTAC) 150 MG tablet Take 1 tablet (150 mg total) by mouth 2 (two) times daily. 02/20/16 02/19/17  Tommi Rumpshonda L Summers, PA-C    Allergies Amoxicillin  Family History  Problem Relation Age of Onset  . Hypertension Mother   . Diabetes Mother   . Diabetes Father   . Diabetes Brother   . Hypertension Brother   . Hypertension Brother   . Heart attack Brother   . Diabetes Brother     Social History Social History  Substance Use Topics  . Smoking status: Former Smoker    Quit date: 11/19/2015  . Smokeless tobacco: Never Used  . Alcohol use Yes     Comment: occasionally    Review of Systems Constitutional: No fever/chills Eyes: No visual changes. ENT: No sore throat. Positive upper lip swollen left side. Cardiovascular: Denies chest pain. Respiratory: Denies shortness of breath. Gastrointestinal: No abdominal pain.  No nausea, no vomiting.   Musculoskeletal: Negative for back pain. Skin:  Negative for rash.  Lip as above. Neurological: Negative for headaches, focal weakness or numbness.  10-point ROS otherwise negative.  ____________________________________________   PHYSICAL EXAM:  VITAL SIGNS: ED Triage Vitals  Enc Vitals Group     BP 02/20/16 1239 (!) 175/85     Pulse Rate 02/20/16 1239 61     Resp 02/20/16 1239 20     Temp 02/20/16 1239 98.2 F (36.8 C)     Temp Source 02/20/16 1239 Oral     SpO2 02/20/16 1239 100 %     Weight 02/20/16 1240 167 lb (75.8 kg)     Height 02/20/16 1240 5\' 5"  (1.651 m)     Head Circumference --      Peak Flow --      Pain Score --      Pain Loc --      Pain Edu? --      Excl. in GC? --     Constitutional: Alert and oriented. Well appearing and in no acute distress. Eyes: Conjunctivae are normal. PERRL. EOMI. Head: Atraumatic. Nose: No congestion/rhinnorhea. Mouth/Throat: Mucous membranes are moist.  Oropharynx non-erythematous. Left sided upper lip there is edema present. There is no edema present in the oropharynx. Uvula is normal size and midline. There is no swelling of the tongue. Patient is speaking in complete sentences without any difficulty. Neck: No stridor.   Hematological/Lymphatic/Immunilogical: No cervical lymphadenopathy. Cardiovascular: Normal rate, regular rhythm. Grossly normal heart sounds.  Good peripheral circulation. Respiratory: Normal respiratory effort.  No retractions. Lungs CTAB. No wheezes or difficulty noted. Gastrointestinal: Soft and nontender. No distention. Musculoskeletal: Moves upper and lower extremities without difficulty. Normal gait was noted. There is no edema noted in the lower extremities. Neurologic:  Normal speech and language. No gross focal neurologic deficits are appreciated. No gait instability. Skin:  Skin is warm, dry and intact. No rash noted. Psychiatric: Mood and affect are normal. Speech and behavior are normal.  ____________________________________________   LABS (all  labs ordered are listed, but only abnormal results are displayed)  Labs Reviewed  BASIC METABOLIC PANEL - Abnormal; Notable for the following:       Result Value   Anion gap 4 (*)    All other components within normal limits  CBC WITH DIFFERENTIAL/PLATELET - Abnormal; Notable for the following:    RBC 5.22 (*)    MCV 73.7 (*)    MCH 23.5 (*)    MCHC 31.9 (*)    RDW 16.2 (*)    All other components within normal limits    PROCEDURES  Procedure(s) performed: None  Procedures  Critical Care performed: No  ____________________________________________   INITIAL IMPRESSION / ASSESSMENT AND PLAN / ED COURSE  Pertinent labs & imaging results that were available during my care of the patient were reviewed by me and considered in my medical decision making (see chart for details).    Clinical Course    Patient was given Decadron 10 mg IV along with Benadryl 25 mg IV. Patient then received  Zantac 50 mg IV and continued to do well without any difficulty. Patient continued to drink fluids while in the exam room. Patient was able to speak in complete sentences and denied any difficulty with swallowing or speaking. On recheck there is still no wheezes or respiratory difficulty. Patient was told to discontinue taking the lisinopril. Patient will be discharged with hydrochlorothiazide 25 mg one daily she may still continue taking the amlodipine. Patient may take Benadryl at home as needed. She is also given a prescription for a prednisone Dosepak And Zantac. She is encouraged to call her primary care doctor on Monday morning. She may return to the emergency room if any severe worsening of her symptoms or urgent concerns.  ____________________________________________   FINAL CLINICAL IMPRESSION(S) / ED DIAGNOSES  Final diagnoses:  Angioedema of lips, initial encounter      NEW MEDICATIONS STARTED DURING THIS VISIT:  New Prescriptions   PREDNISONE (DELTASONE) 10 MG TABLET    Take 6  tablets  today, on day 2 take 5 tablets, day 3 take 4 tablets, day 4 take 3 tablets, day 5 take  2 tablets and 1 tablet the last day   RANITIDINE (ZANTAC) 150 MG TABLET    Take 1 tablet (150 mg total) by mouth 2 (two) times daily.     Note:  This document was prepared using Dragon voice recognition software and may include unintentional dictation errors.    Tommi Rumps, PA-C 02/20/16 1608    Sharyn Creamer, MD 02/21/16 [phone]

## 2016-04-01 ENCOUNTER — Ambulatory Visit: Payer: Self-pay | Admitting: Cardiology

## 2016-04-06 ENCOUNTER — Ambulatory Visit (INDEPENDENT_AMBULATORY_CARE_PROVIDER_SITE_OTHER): Payer: Medicaid Other | Admitting: Cardiology

## 2016-04-06 ENCOUNTER — Encounter: Payer: Self-pay | Admitting: Cardiology

## 2016-04-06 VITALS — BP 136/68 | HR 51 | Ht 65.0 in | Wt 173.8 lb

## 2016-04-06 DIAGNOSIS — R001 Bradycardia, unspecified: Secondary | ICD-10-CM

## 2016-04-06 DIAGNOSIS — I10 Essential (primary) hypertension: Secondary | ICD-10-CM | POA: Diagnosis not present

## 2016-04-06 NOTE — Progress Notes (Signed)
Cardiology Office Note   Date:  04/06/2016   ID:  [name], [name] 06-02-1950, MRN [id number]  Referring Doctor:  [facility]   Cardiologist:   Almond Lint, MD   Reason for consultation:  Chief Complaint  Patient presents with  . Follow-up    NO COMPLAINTS.  Follow-up for atrial flutter    History of Present Illness: [name] is a 66 y.o. female who presents for Follow-up for atrial flutter   Since last visit, patient underwent atrial flutter ablation care of Dr. Graciela Husbands, 01/16/2016. At that time, lisinopril was decreased to 20 once a day from twice a day. She reports that she doesn't develop lid swelling and had to go to the ER. Her ACE inhibitor was stopped and was started on hydrochlorothiazide.  Otherwise, patient doing okay no chest pain, shortness breath, palpitations, lightheadedness, fever, cough, colds, abdominal pain. She has been continuing to check her blood pressure and has been running in the normal range.  ROS:  Please see the history of present illness. Aside from mentioned under HPI, all other systems are reviewed and negative.    Past Medical History:  Diagnosis Date  . Atrial flutter (HCC) 09/2015   a. CHADS2VASc 2 (HTN, female); b. on Eliquis; c. echo 7/17: EF 50-55%, no RWMA, rhythm atrial flutter, mildly dilated LA/RV, moderately to severely dilated RA, trivial pericardial effusion  . Essential hypertension   . GERD (gastroesophageal reflux disease)   . Subclinical hypothyroidism   . Tobacco abuse     Past Surgical History:  Procedure Laterality Date  . ABDOMINAL HYSTERECTOMY    . ABLATION OF DYSRHYTHMIC FOCUS  01/16/2016  . ELECTROPHYSIOLOGIC STUDY N/A 11/11/2015   Procedure: CARDIOVERSION;  Surgeon: Antonieta Iba, MD;  Location: [facility] ORS;  Service: Cardiovascular;  Laterality: N/A;  . ELECTROPHYSIOLOGIC STUDY N/A 01/16/2016   Procedure: A-Flutter Ablation;  Surgeon: Duke Salvia, MD;  Location: [facility] INVASIVE CV LAB;   Service: Cardiovascular;  Laterality: N/A;     reports that she quit smoking about 4 months ago. She has never used smokeless tobacco. She reports that she drinks alcohol. She reports that she does not use drugs.   family history includes Diabetes in her brother, brother, father, and mother; Heart attack in her brother; Hypertension in her brother, brother, and mother.   Outpatient Medications Prior to Visit  Medication Sig Dispense Refill  . acetaminophen (TYLENOL) 500 MG tablet Take 500 mg by mouth every 6 (six) hours as needed for moderate pain or headache.    Marland Kitchen amLODipine (NORVASC) 10 MG tablet Take 1 tablet (10 mg total) by mouth daily. 90 tablet 3  . cholecalciferol (VITAMIN D) 1000 units tablet Take 1,000 Units by mouth daily.    . hydrochlorothiazide (HYDRODIURIL) 25 MG tablet Take 1 tablet (25 mg total) by mouth daily. 10 tablet 0  . levothyroxine (SYNTHROID, LEVOTHROID) 88 MCG tablet Take 88 mcg by mouth daily before breakfast.     . predniSONE (DELTASONE) 10 MG tablet Take 6 tablets  today, on day 2 take 5 tablets, day 3 take 4 tablets, day 4 take 3 tablets, day 5 take  2 tablets and 1 tablet the last day 21 tablet 0  . ranitidine (ZANTAC) 150 MG tablet Take 1 tablet (150 mg total) by mouth 2 (two) times daily. 20 tablet 1  . lisinopril (PRINIVIL,ZESTRIL) 20 MG tablet Take 1 tablet (20 mg total) by mouth daily.     No facility-administered medications prior  to visit.      Allergies: Amoxicillin  ? ACE inhibitor - lip swelling   PHYSICAL EXAM: VS:  BP 136/68 (BP Location: Left Arm, Patient Position: Sitting, Cuff Size: Normal)   Pulse (!) 51   Ht 5\' 5"  (1.651 m)   Wt 173 lb 12.8 oz (78.8 kg)   BMI 28.92 kg/m  , Body mass index is 28.92 kg/m. Wt Readings from Last 3 Encounters:  04/06/16 173 lb 12.8 oz (78.8 kg)  02/20/16 167 lb (75.8 kg)  02/12/16 167 lb 8 oz (76 kg)    GENERAL:  well developed, well nourished, not in acute distress HEENT: normocephalic, pink  conjunctivae, anicteric sclerae, no xanthelasma, normal dentition, oropharynx clear NECK:  no neck vein engorgement, JVP normal, no hepatojugular reflux, carotid upstroke brisk and symmetric, no bruit, no thyromegaly, no lymphadenopathy LUNGS:  good respiratory effort, clear to auscultation bilaterally CV:  PMI not displaced, no thrills, no lifts, S1 and S2 within normal limits, no palpable S3 or S4, no murmurs, no rubs, no gallops ABD:  Soft, nontender, nondistended, normoactive bowel sounds, no abdominal aortic bruit, no hepatomegaly, no splenomegaly MS: nontender back, no kyphosis, no scoliosis, no joint deformities EXT:  2+ DP/PT pulses, no edema, no varicosities, no cyanosis, no clubbing SKIN: warm, nondiaphoretic, normal turgor, no ulcers NEUROPSYCH: alert, oriented to person, place, and time, sensory/motor grossly intact, normal mood, appropriate affect   Recent Labs: 09/23/2015: Magnesium 2.0; TSH 21.960 02/20/2016: BUN 18; Creatinine, Ser 0.66; Hemoglobin 12.3; Platelets 203; Potassium 3.8; Sodium 140   Lipid Panel No results found for: CHOL, TRIG, HDL, CHOLHDL, VLDL, LDLCALC, LDLDIRECT   Other studies Reviewed:  EKG:  The ekg from 11/25/2015 was personally reviewed by me and it revealed sinus bradycardia, 49 BPM. Q waves in V1 and V2.  Additional studies/ records that were reviewed personally reviewed by me today include:   Echo 09/23/2015: Left ventricle: The cavity size was normal. Wall thickness was   increased in a pattern of moderate LVH. Systolic function was   normal. The estimated ejection fraction was in the range of 50%   to 55%. Wall motion was normal; there were no regional wall   motion abnormalities. - Mitral valve: Moderately calcified annulus. - Left atrium: The atrium was mildly dilated. - Right ventricle: The cavity size was mildly dilated. - Right atrium: The atrium was moderately to severely dilated. - Pericardium, extracardiac: A trivial pericardial  effusion was   identified.  Nuclear stress test 09/25/2015:  Rhythm remained in atrial flutter throughout the study. There was no ST segment deviation noted during stress.  Myocardial perfusion imaging revealed normal perfusion on stress and rest. No evidence of ischemia.  This is a low risk study.  Nuclear stress EF: 47%. Please verify with echocardiogram.  Holter monitor 12/02/2015: 24-hour Holter  Overall rhythm was sinus. Heart rate ranged from 45-127 BPM, average of 62 BPM. The maximum heart rate was actually a run of atrial tachycardia, as described below. Longest RR interval was normal at 1.76 seconds.  Supraventricular ectopy: 193 isolated PACs. 11 atrial couplets. 3 runs totaling 26 beats - 17 beats run of likely atrial tachycardia at 128 BPM as the longest. The fastest was a 5 beat run of likely atrial tachycardia at 154 BPM.  Ventricular ectopy: 80 isolated PVCs. 2 ventricular couplets. No ventricular runs.  No documented atrial fibrillation.  ASSESSMENT AND PLAN: atrial flutter with variable conduction Status post cardioversion with 150 biphasic joules 11/11/2015, Dr. Mariah MillingGollan Sinus rhythm Status  post atrial flutter ablation 01/16/2016. DOAC has been discontinued by Dr. Graciela Husbands. Sinus bradycardia  We will continue to monitor bradycardia. Per last visit 12/30/2015, Recommend follow-up with PCP or endocrine for hypothyroidism. It was subclinical hypothyroidism TSH was greater than 20.  Hypertension  For now, continue amlodipine 10 mg by mouth daily. Question allergic reaction to ACE inhibitor. CEA visit. The pressure not too bad at home, continue blood pressure log. Patient instructed to call our office if blood pressure more than 130/80. At that time we may start HCTZ 25 mg by mouth daily. Check BMP today.  Per last office visit 12/30/2015,Consider evaluation for OSA. Patient has history of snoring, difficult to control blood pressure, arrhythmia history, now  bradycardia.   Current medicines are reviewed at length with the patient today.  The patient does not have concerns regarding medicines.  Labs/ tests ordered today include:  Orders Placed This Encounter  Procedures  . Basic metabolic panel  . EKG 12-Lead    I had a lengthy and detailed discussion with the patient regarding diagnoses, prognosis, diagnostic options, treatment options  and side effects of medications.   I counseled the patient on importance of lifestyle modification including heart healthy diet, regular physical activity .     Disposition:   FU with undersigned in 6months   Signed, Almond Lint, MD  04/06/2016 4:30 PM    [facility]  This note was generated in part with voice recognition software and I apologize for any typographical errors that were not detected and corrected.

## 2016-04-06 NOTE — Patient Instructions (Signed)
Labwork: We will call you with those results.   Follow-Up: Your physician wants you to follow-up in: 6 months with Dr. Alvino ChapelIngal. You will receive a reminder letter in the mail two months in advance. If you don't receive a letter, please call our office to schedule the follow-up appointment.  It was a pleasure seeing you today here in the office. Please do not hesitate to give us a call back if you have any further questions. [phone]  [place] CellarPamela A. RN, BSN

## 2016-04-07 LAB — BASIC METABOLIC PANEL
BUN / CREAT RATIO: 19 (ref 12–28)
BUN: 13 mg/dL (ref 8–27)
CO2: 24 mmol/L (ref 18–29)
CREATININE: 0.7 mg/dL (ref 0.57–1.00)
Calcium: 9.7 mg/dL (ref 8.7–10.3)
Chloride: 103 mmol/L (ref 96–106)
GFR, EST AFRICAN AMERICAN: 107 mL/min/{1.73_m2} (ref >59)
GFR, EST NON AFRICAN AMERICAN: 93 mL/min/{1.73_m2} (ref >59)
Glucose: 75 mg/dL (ref 65–99)
Potassium: 3.8 mmol/L (ref 3.5–5.2)
Sodium: 143 mmol/L (ref 134–144)

## 2016-05-06 ENCOUNTER — Telehealth: Payer: Self-pay | Admitting: Cardiology

## 2016-05-06 NOTE — Telephone Encounter (Signed)
Spoke with patient and let her know that I sent cardiac clearance over to the number she provided. She was appreciative for the call and had no further questions.

## 2016-05-06 NOTE — Telephone Encounter (Addendum)
Stress test was 09/25/15 was negative for ischemia. echo was 09/23/15 showed normal LVEF. She has had no symptoms of chest pain shortness breath from last visit 10/04/2016. We do not see any indication for further cardiac testing at this point. She is intermediate cardiac risk.

## 2016-05-06 NOTE — Telephone Encounter (Signed)
Spoke with patient and she states that she is needing some dental work and she needs cardiac clearance faxed to [phone]. Patient was last seen in our office on 04/06/16. Stress test was 09/25/15, holter was 12/02/15, echo was 09/23/15, and ablation on 01/16/16. She is due to follow up with us in July 2018. Let her know that I would forward this to Dr. Alvino ChapelIngal and I would then send to the number she provided. She was appreciative for my call and had no further questions at this time.

## 2016-05-06 NOTE — Telephone Encounter (Signed)
Pt needs dental work, and needs to ok to have this done. Please call and advise.

## 2016-11-22 ENCOUNTER — Ambulatory Visit (INDEPENDENT_AMBULATORY_CARE_PROVIDER_SITE_OTHER): Payer: Self-pay | Admitting: Cardiovascular Disease

## 2016-11-22 VITALS — BP 162/79 | HR 51 | Ht 65.0 in | Wt 168.5 lb

## 2016-11-22 DIAGNOSIS — I4892 Unspecified atrial flutter: Secondary | ICD-10-CM

## 2016-11-22 DIAGNOSIS — I10 Essential (primary) hypertension: Secondary | ICD-10-CM

## 2016-11-22 MED ORDER — HYDROCHLOROTHIAZIDE 25 MG PO TABS: 25.0000 mg | ORAL_TABLET | Freq: Two times a day (BID) | ORAL | 5 refills | Status: DC

## 2016-11-22 NOTE — Progress Notes (Signed)
Cardiology Office Note   Date:  11/22/2016   ID:  [name], DOB Apr 15, 1950, MRN [id number]  PCP:  [facility]  Cardiologist:   Lorine Bears, MD   Chief Complaint  Patient presents with  . other    6 month f/u elevated BP . Meds reviewed verbally with pt.      History of Present Illness: [name] is a 66 y.o. female who presents for A follow-up visit. She has known history of typical atrial flutter status post ablation done by Dr. Graciela Husbands last year. She also has essential hypertension and previous tobacco use. She had angioedema in January while she was on lisinopril. She was switched to hydrochlorothiazide but she only took that for one month. She quit smoking although she is still exposed to secondhand smoking. She denies any chest pain, shortness of breath or palpitations. Blood pressure is running high.   Past Medical History:  Diagnosis Date  . Atrial flutter (HCC) 09/2015   a. CHADS2VASc 2 (HTN, female); b. on Eliquis; c. echo 7/17: EF 50-55%, no RWMA, rhythm atrial flutter, mildly dilated LA/RV, moderately to severely dilated RA, trivial pericardial effusion  . Essential hypertension   . GERD (gastroesophageal reflux disease)   . Subclinical hypothyroidism   . Tobacco abuse     Past Surgical History:  Procedure Laterality Date  . ABDOMINAL HYSTERECTOMY    . ABLATION OF DYSRHYTHMIC FOCUS  01/16/2016  . ELECTROPHYSIOLOGIC STUDY N/A 11/11/2015   Procedure: CARDIOVERSION;  Surgeon: Antonieta Iba, MD;  Location: [facility] ORS;  Service: Cardiovascular;  Laterality: N/A;  . ELECTROPHYSIOLOGIC STUDY N/A 01/16/2016   Procedure: A-Flutter Ablation;  Surgeon: Duke Salvia, MD;  Location: [facility] INVASIVE CV LAB;  Service: Cardiovascular;  Laterality: N/A;     Current Outpatient Prescriptions  Medication Sig Dispense Refill  . acetaminophen (TYLENOL) 500 MG tablet Take 500 mg by mouth every 6 (six) hours as needed for moderate pain or headache.    Marland Kitchen  amLODipine (NORVASC) 10 MG tablet Take 1 tablet (10 mg total) by mouth daily. 90 tablet 3  . cholecalciferol (VITAMIN D) 1000 units tablet Take 1,000 Units by mouth daily.    . hydrochlorothiazide (HYDRODIURIL) 25 MG tablet Take 1 tablet (25 mg total) by mouth daily. 10 tablet 0  . levothyroxine (SYNTHROID, LEVOTHROID) 88 MCG tablet Take 88 mcg by mouth daily before breakfast.      No current facility-administered medications for this visit.     Allergies:   Amoxicillin and Lisinopril    Social History:  The patient  reports that she quit smoking about a year ago. She has never used smokeless tobacco. She reports that she drinks alcohol. She reports that she does not use drugs.   Family History:  The patient's family history includes Diabetes in her brother, brother, father, and mother; Heart attack in her brother; Hypertension in her brother, brother, and mother.    ROS:  Please see the history of present illness.   Otherwise, review of systems are positive for none.   All other systems are reviewed and negative.    PHYSICAL EXAM: VS:  BP (!) 162/79 (BP Location: Left Arm, Patient Position: Sitting, Cuff Size: Normal)   Pulse (!) 51   Ht  (1.651 m)   Wt 168 lb 8 oz (76.4 kg)   BMI 28.04 kg/m  , BMI Body mass index is 28.04 kg/m. GEN: Well nourished, well developed, in no acute distress  HEENT: normal  Neck: no JVD, carotid bruits, or masses Cardiac: RRR; no murmurs, rubs, or gallops,no edema  Respiratory:  clear to auscultation bilaterally, normal work of breathing GI: soft, nontender, nondistended, + BS MS: no deformity or atrophy  Skin: warm and dry, no rash Neuro:  Strength and sensation are intact Psych: euthymic mood, full affect   EKG:  EKG is ordered today. The ekg ordered today demonstrates sinus bradycardia with nonspecific ST and T wave changes.   Recent Labs: 02/20/2016: Hemoglobin 12.3; Platelets 203 04/06/2016: BUN 13; Creatinine, Ser 0.70; Potassium 3.8;  Sodium 143    Lipid Panel No results found for: CHOL, TRIG, HDL, CHOLHDL, VLDL, LDLCALC, LDLDIRECT    Wt Readings from Last 3 Encounters:  11/22/16 168 lb 8 oz (76.4 kg)  04/06/16 173 lb 12.8 oz (78.8 kg)  02/20/16 167 lb (75.8 kg)      PAD Screen 11/06/2015 10/09/2015  Previous PAD dx? No No  Previous surgical procedure? No No  Pain with walking? Yes No  Subsides with rest? Yes -  Feet/toe relief with dangling? No No  Painful, non-healing ulcers? No No  Extremities discolored? No No      ASSESSMENT AND PLAN:  1.  History of typical atrial flutter status post successful ablation: No evidence of recurrent arrhythmia. Continue observation. No need for anticoagulation unless she develops recurrent atrial fibrillation or flutter.  2. Essential hypertension: Blood pressure is elevated. She has not been taking hydrochlorothiazide. I'm going to resume this medication and check basic metabolic profile in one week. Continue amlodipine.    Disposition:   FU with me in 6 months  Signed,  Lorine BearsMuhammad Kiven Vangilder, MD  11/22/2016 2:32 PM    [facility]

## 2016-11-22 NOTE — Patient Instructions (Signed)
Medication Instructions:  Your physician has recommended you make the following change in your medication:  START taking hydrochlorothiazide 25mg  twice daily    Labwork: BMET in one week at the [facility]. No appointment necessary  Testing/Procedures: none  Follow-Up: Your physician wants you to follow-up in: 6 months with Dr. Kirke CorinArida.  You will receive a reminder letter in the mail two months in advance. If you don't receive a letter, please call our office to schedule the follow-up appointment.   Any Other Special Instructions Will Be Listed Below (If Applicable).     If you need a refill on your cardiac medications before your next appointment, please call your pharmacy.

## 2016-11-23 ENCOUNTER — Other Ambulatory Visit: Payer: Self-pay

## 2016-11-23 MED ORDER — HYDROCHLOROTHIAZIDE 25 MG PO TABS: 25.0000 mg | ORAL_TABLET | Freq: Two times a day (BID) | ORAL | 5 refills | Status: DC
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
## 2016-12-12 ENCOUNTER — Other Ambulatory Visit: Payer: Self-pay | Admitting: Internal Medicine

## 2016-12-13 ENCOUNTER — Emergency Department: Payer: Self-pay

## 2016-12-13 ENCOUNTER — Emergency Department
Admission: EM | Admit: 2016-12-13 | Discharge: 2016-12-13 | Disposition: A | Discharge status: Home / Self Care | Payer: Self-pay | Attending: Emergency Medicine | Admitting: Emergency Medicine

## 2016-12-13 ENCOUNTER — Encounter: Payer: Self-pay | Admitting: Emergency Medicine

## 2016-12-13 DIAGNOSIS — Y939 Activity, unspecified: Secondary | ICD-10-CM | POA: Insufficient documentation

## 2016-12-13 DIAGNOSIS — M7061 Trochanteric bursitis, right hip: Secondary | ICD-10-CM | POA: Insufficient documentation

## 2016-12-13 DIAGNOSIS — I10 Essential (primary) hypertension: Secondary | ICD-10-CM | POA: Insufficient documentation

## 2016-12-13 DIAGNOSIS — E039 Hypothyroidism, unspecified: Secondary | ICD-10-CM | POA: Insufficient documentation

## 2016-12-13 DIAGNOSIS — Z880 Allergy status to penicillin: Secondary | ICD-10-CM | POA: Insufficient documentation

## 2016-12-13 DIAGNOSIS — Z87891 Personal history of nicotine dependence: Secondary | ICD-10-CM | POA: Insufficient documentation

## 2016-12-13 MED ORDER — KETOROLAC TROMETHAMINE 30 MG/ML IJ SOLN
30.0000 mg | Freq: Once | INTRAMUSCULAR | Status: AC
  Administered 2016-12-13: 30 mg via INTRAMUSCULAR

## 2016-12-13 MED ORDER — OXYCODONE-ACETAMINOPHEN 5-325 MG PO TABS
2.0000 | ORAL_TABLET | Freq: Once | ORAL | Status: AC
  Administered 2016-12-13: 2 via ORAL
  Filled 2016-12-13: qty 2

## 2016-12-13 MED ORDER — IBUPROFEN 600 MG PO TABS: 600.0000 mg | ORAL_TABLET | Freq: Three times a day (TID) | ORAL | 0 refills | Status: DC | PRN

## 2016-12-13 MED ORDER — OXYCODONE-ACETAMINOPHEN 5-325 MG PO TABS: 1.0000 | ORAL_TABLET | Freq: Four times a day (QID) | ORAL | 0 refills | Status: DC | PRN

## 2016-12-13 MED ORDER — KETOROLAC TROMETHAMINE 30 MG/ML IJ SOLN
INTRAMUSCULAR | Status: AC
  Administered 2016-12-13: 30 mg via INTRAMUSCULAR
  Filled 2016-12-13: qty 1

## 2016-12-13 NOTE — ED Provider Notes (Signed)
[facility] Emergency Department Provider Note  ____________________________________________   First MD Initiated Contact with Patient 12/13/16 2038     (approximate)  I have reviewed the triage vital signs and the nursing notes.   HISTORY  Chief Complaint Leg Pain   HPI [name] is a 66 y.o. female who comes to the emergency Department with roughly 72 hours of moderate to severe intermittent aching pain in her right lateral thigh as well as right anterior thigh. The pain seems to come and go and nothing in particular makes it better or worse although it is sometimes worse when ranging her hip. She has no leg swelling. No fevers or chills. She has taken over-the-counter medications including Tylenol and ibuprofen with minimal relief. She has no numbness or weakness. No back pain.   Past Medical History:  Diagnosis Date  . Atrial flutter (HCC) 09/2015   a. CHADS2VASc 2 (HTN, female); b. on Eliquis; c. echo 7/17: EF 50-55%, no RWMA, rhythm atrial flutter, mildly dilated LA/RV, moderately to severely dilated RA, trivial pericardial effusion  . Essential hypertension   . GERD (gastroesophageal reflux disease)   . Subclinical hypothyroidism   . Tobacco abuse     Patient Active Problem List   Diagnosis Date Noted  . Atrial flutter (HCC) 01/16/2016  . SOB (shortness of breath)   . Subclinical hypothyroidism   . Typical atrial flutter (HCC) 09/24/2015  . Essential hypertension 09/23/2015  . Tobacco abuse 09/23/2015    Past Surgical History:  Procedure Laterality Date  . ABDOMINAL HYSTERECTOMY    . ABLATION OF DYSRHYTHMIC FOCUS  01/16/2016  . ELECTROPHYSIOLOGIC STUDY N/A 11/11/2015   Procedure: CARDIOVERSION;  Surgeon: Antonieta Iba, MD;  Location: [facility] ORS;  Service: Cardiovascular;  Laterality: N/A;  . ELECTROPHYSIOLOGIC STUDY N/A 01/16/2016   Procedure: A-Flutter Ablation;  Surgeon: Duke Salvia, MD;  Location: [facility] INVASIVE CV LAB;  Service:  Cardiovascular;  Laterality: N/A;    Prior to Admission medications   Medication Sig Start Date End Date Taking? Authorizing Provider  acetaminophen (TYLENOL) 500 MG tablet Take 500 mg by mouth every 6 (six) hours as needed for moderate pain or headache.    [provider]  amLODipine (NORVASC) 10 MG tablet TAKE ONE TABLET BY MOUTH ONCE DAILY 12/13/16   Duke Salvia, MD  cholecalciferol (VITAMIN D) 1000 units tablet Take 1,000 Units by mouth daily.    [provider]  hydrochlorothiazide (HYDRODIURIL) 25 MG tablet Take 1 tablet (25 mg total) by mouth 2 (two) times daily. 11/23/16   Iran Ouch, MD  levothyroxine (SYNTHROID, LEVOTHROID) 88 MCG tablet Take 88 mcg by mouth daily before breakfast.  10/24/15 11/22/16  [provider]    Allergies Amoxicillin and Lisinopril  Family History  Problem Relation Age of Onset  . Hypertension Mother   . Diabetes Mother   . Diabetes Father   . Diabetes Brother   . Hypertension Brother   . Hypertension Brother   . Heart attack Brother   . Diabetes Brother     Social History Social History  Substance Use Topics  . Smoking status: Former Smoker    Quit date: 11/19/2015  . Smokeless tobacco: Never Used  . Alcohol use Yes     Comment: occasionally    Review of Systems Constitutional: No fever/chills ENT: No sore throat. Cardiovascular: Denies chest pain. Respiratory: Denies shortness of breath. Gastrointestinal: No abdominal pain.  No nausea, no vomiting.  No diarrhea.  No constipation.  Musculoskeletal: Negative for back pain. Neurological: Negative for headaches   ____________________________________________   PHYSICAL EXAM:  VITAL SIGNS: ED Triage Vitals  Enc Vitals Group     BP 12/13/16 1835 (!) 141/88     Pulse Rate 12/13/16 1835 69     Resp 12/13/16 1835 16     Temp 12/13/16 1835 98.1 F (36.7 C)     Temp Source 12/13/16 1835 Oral     SpO2 12/13/16 1835 100 %     Weight 12/13/16 1836 168  lb (76.2 kg)     Height 12/13/16 1836  (1.651 m)     Head Circumference --      Peak Flow --      Pain Score 12/13/16 1835 8     Pain Loc --      Pain Edu? --      Excl. in GC? --     Constitutional: alert and oriented 4 appears uncomfortable when sitting and holding her right thigh no diaphoresis Head: Atraumatic. Nose: No congestion/rhinnorhea. Mouth/Throat: No trismus Neck: No stridor.   Cardiovascular: regular rate and rhythm Respiratory: Normal respiratory effort.  No retractions. MSK: exquisite tenderness over right trochanteric bursa and significant discomfort with r flexion and internal rotation of her right hip 2+ dorsalis pedis pulses bilaterally and neurovascularly intact No peripheral edema Neurologic:  Normal speech and language. No gross focal neurologic deficits are appreciated.  Skin:  Skin is warm, dry and intact. No rash noted.    ____________________________________________  LABS (all labs ordered are listed, but only abnormal results are displayed)  Labs Reviewed - No data to display   __________________________________________  EKG   ____________________________________________  RADIOLOGY  ultrasound of the right lower extremity reviewed by me with no clot X-ray of the right hip shows osteoarthritis but no other disease noted ____________________________________________   PROCEDURES  Procedure(s) performed: no  Procedures  Critical Care performed: no  Observation: no ____________________________________________   INITIAL IMPRESSION / ASSESSMENT AND PLAN / ED COURSE  Pertinent labs & imaging results that were available during my care of the patient were reviewed by me and considered in my medical decision making (see chart for details).  The patient arrives uncomfortable appearing with exquisite tenderness of her right trochanteric bursa. Her symptoms seem to be worse with range of motion as well as with focal pressure which is  consistent with arthritis versus trochanteric bursitis. Ultrasound obtained from triage is negative for clot. Her pain is somewhat improved after Toradol although only minimally so she was given her 2 tablets of oxycodone as her boyfriend is driving her home. Following the oxycodone she feels significant relief. This point I will give her a trial of high-dose anti-inflammatories as well as right through Percocet for the severe pain. I'm referring her back to her primary care physician later on this week for reexamination. Strict return precautions are given.      ____________________________________________   FINAL CLINICAL IMPRESSION(S) / ED DIAGNOSES  Final diagnoses:  None      NEW MEDICATIONS STARTED DURING THIS VISIT:  New Prescriptions   No medications on file     Note:  This document was prepared using Dragon voice recognition software and may include unintentional dictation errors.      Merrily Brittle, MD 12/13/16 2219

## 2016-12-13 NOTE — ED Notes (Signed)
Pt was advised not to drive since she received narcotics. Pt states that her husband is driving her home.

## 2016-12-13 NOTE — Discharge Instructions (Signed)
Please take your ibuprofen 3 times a day around the clock no matter if you have pain are not for the next 2 weeks. Also please make an appointment to follow-up with your primary care physician later on this week for reexamination. Return to the emergency department sooner for any new or worsening symptoms.  It was a pleasure to take care of you today, and thank you for coming to our emergency department.  If you have any questions or concerns before leaving please ask the nurse to grab me and I'm more than happy to go through your aftercare instructions again.  If you were prescribed any opioid pain medication today such as Norco, Vicodin, Percocet, morphine, hydrocodone, or oxycodone please make sure you do not drive when you are taking this medication as it can alter your ability to drive safely.  If you have any concerns once you are home that you are not improving or are in fact getting worse before you can make it to your follow-up appointment, please do not hesitate to call 911 and come back for further evaluation.  Merrily Brittle, MD  Results for orders placed or performed in visit on 04/06/16  Basic metabolic panel  Result Value Ref Range   Glucose 75 65 - 99 mg/dL   BUN 13 8 - 27 mg/dL   Creatinine, Ser 4.09 0.57 - 1.00 mg/dL   GFR calc non Af Amer 93 >59 mL/min/1.73   GFR calc Af Amer 107 >59 mL/min/1.73   BUN/Creatinine Ratio 19 12 - 28   Sodium 143 134 - 144 mmol/L   Potassium 3.8 3.5 - 5.2 mmol/L   Chloride 103 96 - 106 mmol/L   CO2 24 18 - 29 mmol/L   Calcium 9.7 8.7 - 10.3 mg/dL   US Venous Img Lower Unilateral Right  Result Date: 12/13/2016 CLINICAL DATA:  Right thigh pain for 3 days. EXAM: RIGHT LOWER EXTREMITY VENOUS DOPPLER ULTRASOUND TECHNIQUE: Gray-scale sonography with graded compression, as well as color Doppler and duplex ultrasound were performed to evaluate the lower extremity deep venous systems from the level of the common femoral vein and including the common  femoral, femoral, profunda femoral, popliteal and calf veins including the posterior tibial, peroneal and gastrocnemius veins when visible. The superficial great saphenous vein was also interrogated. Spectral Doppler was utilized to evaluate flow at rest and with distal augmentation maneuvers in the common femoral, femoral and popliteal veins. COMPARISON:  None. FINDINGS: Contralateral Common Femoral Vein: Respiratory phasicity is normal and symmetric with the symptomatic side. No evidence of thrombus. Normal compressibility. Common Femoral Vein: No evidence of thrombus. Normal compressibility, respiratory phasicity and response to augmentation. Saphenofemoral Junction: No evidence of thrombus. Normal compressibility and flow on color Doppler imaging. Profunda Femoral Vein: No evidence of thrombus. Normal compressibility and flow on color Doppler imaging. Femoral Vein: No evidence of thrombus. Normal compressibility, respiratory phasicity and response to augmentation. Popliteal Vein: No evidence of thrombus. Normal compressibility, respiratory phasicity and response to augmentation. Calf Veins: No evidence of thrombus. Normal compressibility and flow on color Doppler imaging. Superficial Great Saphenous Vein: No evidence of thrombus. Normal compressibility and flow on color Doppler imaging. Venous Reflux:  None. Other Findings:  None. IMPRESSION: No evidence of DVT within the right lower extremity. Electronically Signed   By: Myles Rosenthal M.D.   On: 12/13/2016 19:32   Dg Hip Unilat With Pelvis 2-3 Views Right  Result Date: 12/13/2016 CLINICAL DATA:  Right hip pain for 3 days.  No known  injury. EXAM: DG HIP (WITH OR WITHOUT PELVIS) 2-3V RIGHT COMPARISON:  None. FINDINGS: There is no evidence of hip fracture or dislocation. Moderate right hip osteoarthritis is seen with subchondral geodes formation. Moderate osteoarthritis also seen involving the left hip. IMPRESSION: No acute findings.  Moderate hip  osteoarthritis. Electronically Signed   By: Myles Rosenthal M.D.   On: 12/13/2016 21:10

## 2016-12-13 NOTE — ED Triage Notes (Signed)
Right thigh pain since Friday.  Denies injury.

## 2017-08-27 ENCOUNTER — Other Ambulatory Visit: Payer: Self-pay

## 2017-08-27 ENCOUNTER — Emergency Department
Admission: EM | Admit: 2017-08-27 | Discharge: 2017-08-27 | Disposition: A | Discharge status: Home / Self Care | Payer: Self-pay | Attending: Emergency Medicine | Admitting: Emergency Medicine

## 2017-08-27 ENCOUNTER — Encounter: Payer: Self-pay | Admitting: *Deleted

## 2017-08-27 DIAGNOSIS — R202 Paresthesia of skin: Secondary | ICD-10-CM | POA: Insufficient documentation

## 2017-08-27 DIAGNOSIS — Y9384 Activity, sleeping: Secondary | ICD-10-CM | POA: Insufficient documentation

## 2017-08-27 DIAGNOSIS — W57XXXA Bitten or stung by nonvenomous insect and other nonvenomous arthropods, initial encounter: Secondary | ICD-10-CM | POA: Insufficient documentation

## 2017-08-27 DIAGNOSIS — I10 Essential (primary) hypertension: Secondary | ICD-10-CM | POA: Insufficient documentation

## 2017-08-27 DIAGNOSIS — Z87891 Personal history of nicotine dependence: Secondary | ICD-10-CM | POA: Insufficient documentation

## 2017-08-27 DIAGNOSIS — S0086XA Insect bite (nonvenomous) of other part of head, initial encounter: Secondary | ICD-10-CM | POA: Insufficient documentation

## 2017-08-27 DIAGNOSIS — Y998 Other external cause status: Secondary | ICD-10-CM | POA: Insufficient documentation

## 2017-08-27 DIAGNOSIS — Z79899 Other long term (current) drug therapy: Secondary | ICD-10-CM | POA: Insufficient documentation

## 2017-08-27 DIAGNOSIS — R220 Localized swelling, mass and lump, head: Secondary | ICD-10-CM | POA: Insufficient documentation

## 2017-08-27 DIAGNOSIS — Y92013 Bedroom of single-family (private) house as the place of occurrence of the external cause: Secondary | ICD-10-CM | POA: Insufficient documentation

## 2017-08-27 MED ORDER — DIPHENHYDRAMINE HCL 25 MG PO CAPS
25.0000 mg | ORAL_CAPSULE | Freq: Once | ORAL | Status: AC
  Administered 2017-08-27: 25 mg via ORAL
  Filled 2017-08-27: qty 1

## 2017-08-27 NOTE — ED Notes (Signed)
Pt c/o bug big to face, pt using OTC meds with minimal relief.  No problems swallowing or swelling or shortness of breath.  Pt is alert and oriented.

## 2017-08-27 NOTE — ED Triage Notes (Signed)
Pt says that she felt something bite her left face around 0230, she immediately applied hydrocortisone cream, ice pack, and took a Claritin.  Itching and swelling to the left lateral lower and just at the corner of the lip.

## 2017-08-27 NOTE — ED Provider Notes (Signed)
[facility] Emergency Department Provider Note  ____________________________________________  Time seen: Approximately 7:58 AM  I have reviewed the triage vital signs and the nursing notes.   HISTORY  Chief Complaint Insect Bite    HPI [name] is a 67 y.o. female with a history of GERD and hypertension who comes to the ED due to a suspected insect bite on the face.  At about 230 this morning she suddenly felt like something had bitten her on the left cheek, causing pain and swelling and tingling sensation.  She applied hydrocortisone and took Claritin.  She put an ice pack on it but noticed that it was swelling up more over the left cheek and even over the left lower lip.  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  Denies any trouble breathing or swallowing.  No tongue swelling or throat swelling.  No shortness of breath.  No vomiting.  No skin rash.  Denies any known allergies to insect bites or bee stings.  No bats in the house  In the time it taken her to get to the hospital, she reports the swelling has significantly improved with just a small residual area on the cheek    Past Medical History:  Diagnosis Date  . Atrial flutter (HCC) 09/2015   a. CHADS2VASc 2 (HTN, female); b. on Eliquis; c. echo 7/17: EF 50-55%, no RWMA, rhythm atrial flutter, mildly dilated LA/RV, moderately to severely dilated RA, trivial pericardial effusion  . Essential hypertension   . GERD (gastroesophageal reflux disease)   . Subclinical hypothyroidism   . Tobacco abuse      Patient Active Problem List   Diagnosis Date Noted  . Atrial flutter (HCC) 01/16/2016  . SOB (shortness of breath)   . Subclinical hypothyroidism   . Typical atrial flutter (HCC) 09/24/2015  . Essential hypertension 09/23/2015  . Tobacco abuse 09/23/2015     Past Surgical History:  Procedure Laterality Date  . ABDOMINAL HYSTERECTOMY    .  ABLATION OF DYSRHYTHMIC FOCUS  01/16/2016  . ELECTROPHYSIOLOGIC STUDY N/A 11/11/2015   Procedure: CARDIOVERSION;  Surgeon: Antonieta Iba, MD;  Location: [facility] ORS;  Service: Cardiovascular;  Laterality: N/A;  . ELECTROPHYSIOLOGIC STUDY N/A 01/16/2016   Procedure: A-Flutter Ablation;  Surgeon: Duke Salvia, MD;  Location: [facility] INVASIVE CV LAB;  Service: Cardiovascular;  Laterality: N/A;     Prior to Admission medications   Medication Sig Start Date End Date Taking? Authorizing Provider  acetaminophen (TYLENOL) 500 MG tablet Take 500 mg by mouth every 6 (six) hours as needed for moderate pain or headache.    [provider]  amLODipine (NORVASC) 10 MG tablet TAKE ONE TABLET BY MOUTH ONCE DAILY 12/13/16   Duke Salvia, MD  cholecalciferol (VITAMIN D) 1000 units tablet Take 1,000 Units by mouth daily.    [provider]  hydrochlorothiazide (HYDRODIURIL) 25 MG tablet Take 1 tablet (25 mg total) by mouth 2 (two) times daily. 11/23/16   Iran Ouch, MD  ibuprofen (ADVIL,MOTRIN) 600 MG tablet Take 1 tablet (600 mg total) by mouth every 8 (eight) hours as needed. 12/13/16   Merrily Brittle, MD  levothyroxine (SYNTHROID, LEVOTHROID) 88 MCG tablet Take 88 mcg by mouth daily before breakfast.  10/24/15 11/22/16  [provider]  oxyCODONE-acetaminophen (ROXICET) 5-325 MG tablet Take 1 tablet by mouth every 6 (six) hours as needed for severe pain. 12/13/16   Merrily Brittle, MD  Allergies Amoxicillin and Lisinopril   Family History  Problem Relation Age of Onset  . Hypertension Mother   . Diabetes Mother   . Diabetes Father   . Diabetes Brother   . Hypertension Brother   . Hypertension Brother   . Heart attack Brother   . Diabetes Brother     Social History Social History   Tobacco Use  . Smoking status: Former Smoker    Last attempt to quit: 11/19/2015    Years since quitting: 1.7  . Smokeless tobacco: Never Used  Substance Use Topics  . Alcohol use:  Yes    Comment: occasionally  . Drug use: No    Review of Systems  Constitutional:   No fever or chills.  ENT:   No sore throat. No rhinorrhea. Cardiovascular:   No chest pain or syncope. Respiratory:   No dyspnea or cough. Gastrointestinal:   Negative for abdominal pain, vomiting and diarrhea.  Musculoskeletal:   Negative for focal pain or swelling All other systems reviewed and are negative except as documented above in ROS and HPI.  ____________________________________________   PHYSICAL EXAM:  VITAL SIGNS: ED Triage Vitals  Enc Vitals Group     BP 08/27/17 0419 (!) 199/98     Pulse Rate 08/27/17 0419 60     Resp 08/27/17 0419 18     Temp 08/27/17 0419 98.2 F (36.8 C)     Temp Source 08/27/17 0419 Oral     SpO2 08/27/17 0419 100 %     Weight 08/27/17 0418 160 lb (72.6 kg)     Height 08/27/17 0418 5\' 5"  (1.651 m)     Head Circumference --      Peak Flow --      Pain Score 08/27/17 0417 2     Pain Loc --      Pain Edu? --      Excl. in GC? --     Vital signs reviewed, nursing assessments reviewed.   Constitutional:   Alert and oriented. Non-toxic appearance. Eyes:   Conjunctivae are normal. EOMI. PERRL. ENT      Head:   Normocephalic and atraumatic.  1 cm area of soft tissue swelling over the left cheek.  Not indurated inflamed or purulent.  Nontender.  No embedded stinger.  Lips normal      Nose:   No congestion/rhinnorhea.       Mouth/Throat:   MMM, no pharyngeal erythema. No peritonsillar mass.  Intraoral exam normal, buccal mucosa is normal.      Neck:   No meningismus. Full ROM. Hematological/Lymphatic/Immunilogical:   No cervical lymphadenopathy. Cardiovascular:   RRR. Symmetric bilateral radial and DP pulses.  No murmurs.  Respiratory:   Normal respiratory effort without tachypnea/retractions. Breath sounds are clear and equal bilaterally. No wheezes/rales/rhonchi.   Musculoskeletal:   Normal range of motion in all extremities. No joint effusions.  No  lower extremity tenderness.  No edema. Neurologic:   Normal speech and language.  Motor grossly intact. No acute focal neurologic deficits are appreciated.  Skin:    Skin is warm, dry and intact. No rash noted.  No petechiae, purpura, or bullae.  ____________________________________________    LABS (pertinent positives/negatives) (all labs ordered are listed, but only abnormal results are displayed) Labs Reviewed - No data to display ____________________________________________   EKG    ____________________________________________    RADIOLOGY  No results found.  ____________________________________________   PROCEDURES Procedures  ____________________________________________    CLINICAL IMPRESSION / ASSESSMENT AND PLAN /  ED COURSE  Pertinent labs & imaging results that were available during my care of the patient were reviewed by me and considered in my medical decision making (see chart for details).    Patient well-appearing, nontoxic, unremarkable vital signs.  Presents after suspected insect bite, possibly a bee sting.  Exam is reassuring at this time without evidence of angioedema or anaphylaxis.  Localized skin reaction which is improving after treatment at home.  I will add oral Benadryl, follow-up with primary care as needed, advised to return to hospital if she has any worsening symptoms such as tongue or throat swelling, dizziness, difficulty breathing or difficulty swallowing.      ____________________________________________   FINAL CLINICAL IMPRESSION(S) / ED DIAGNOSES    Final diagnoses:  Insect bite, unspecified site, initial encounter     ED Discharge Orders    None      Portions of this note were generated with dragon dictation software. Dictation errors may occur despite best attempts at proofreading.    Sharman CheekStafford, Dhyana Bastone, MD 08/27/17 [phone]

## 2017-08-27 NOTE — Discharge Instructions (Addendum)
Continue taking oral Benadryl every 6 hours as needed.  Return to the hospital if you develop difficulty breathing, swollen tongue or throat, or difficulty swallowing.

## 2017-08-27 NOTE — ED Notes (Addendum)
Pt to the er for insect bite and swelling to the left side of the face. Pt took claritin and used hydrocortisone cream. Pt says the pain was like a burning. Pt felt the bite and did not see any bites. Pt has not had a reaction to a bug bite like this before. Pt reports itching has improved. Pt does have mild swelling to the lower left side of the face and swelling to the left side of the lower lip. No distress noted.

## 2017-10-20 IMAGING — CT CT FEMUR *L* W/O CM
3 series · 9 of 33 positions shown, 10 images · non-contrast
Comparison: None.

CLINICAL DATA: Left-sided pain for 1 week.  No known trauma.

EXAM:
CT OF THE LEFT FEMUR WITHOUT CONTRAST
TECHNIQUE: Multidetector CT imaging was performed according to the standard
protocol. Multiplanar CT image reconstructions were also generated.

[Series 5: coronal st · coronal · 0.49mm/px · 3 of 98 slices shown]
[im 20/98  bone]
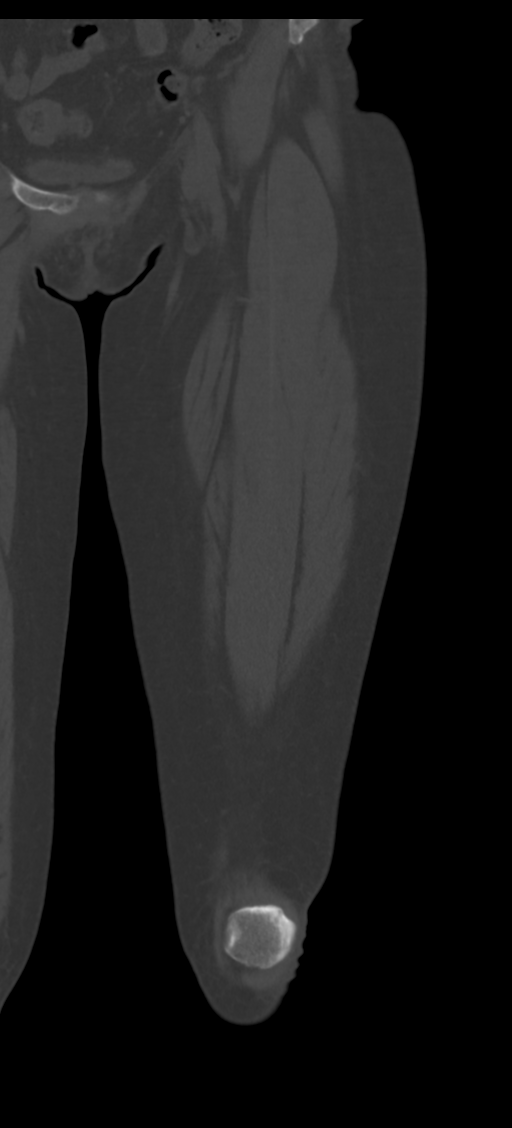
[im 39/98  bone]
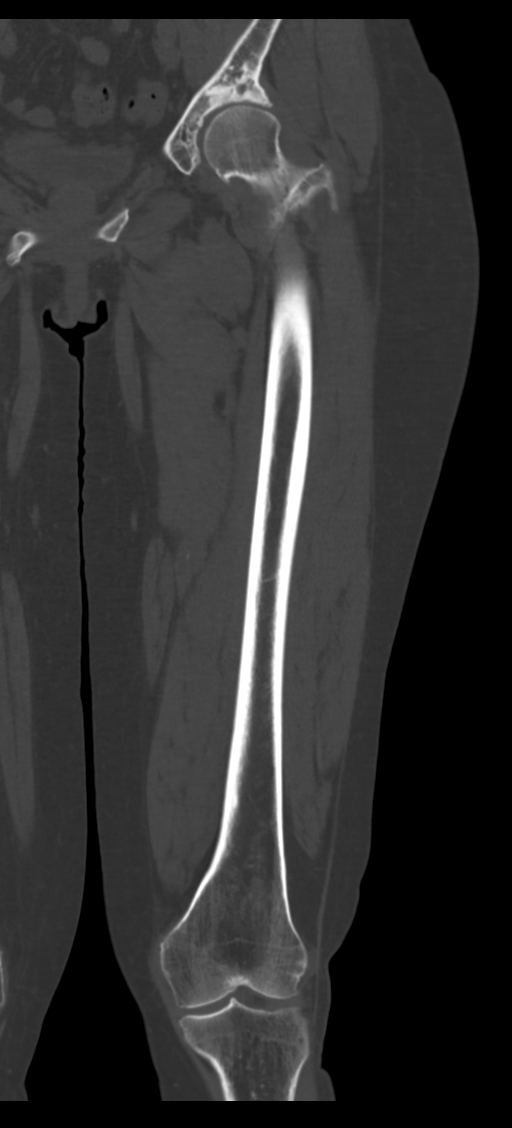
[im 59/98  bone]
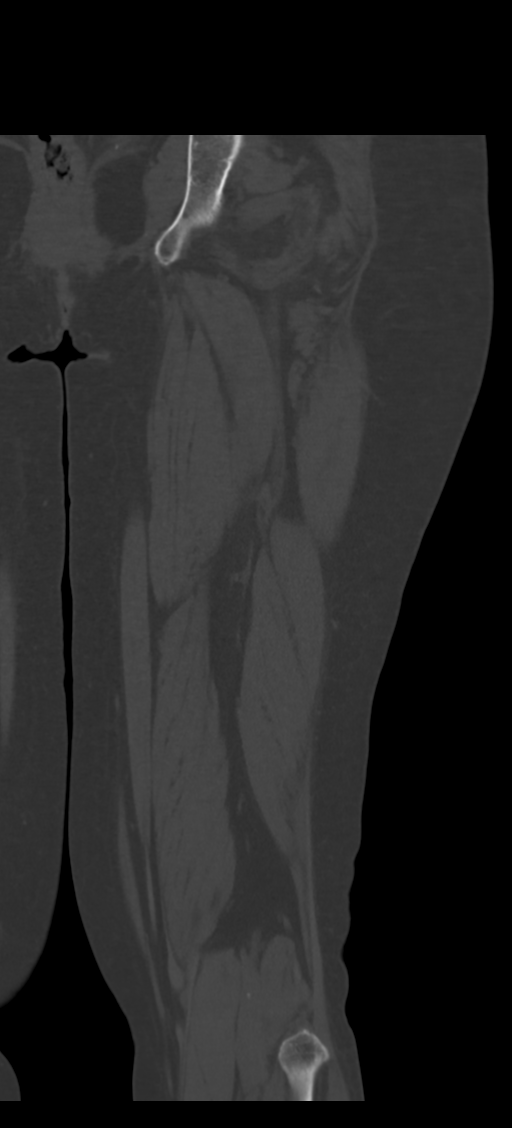

[Series 6: sagittal st · sagittal · 0.50mm/px · 5 of 70 slices shown]
[im 24/70  bone]
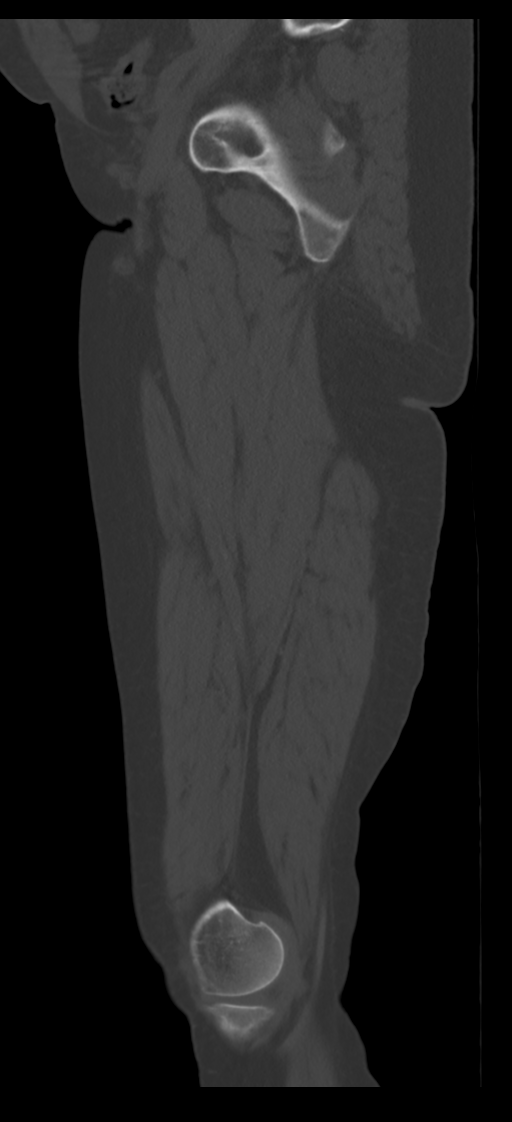
[im 29/70  bone]
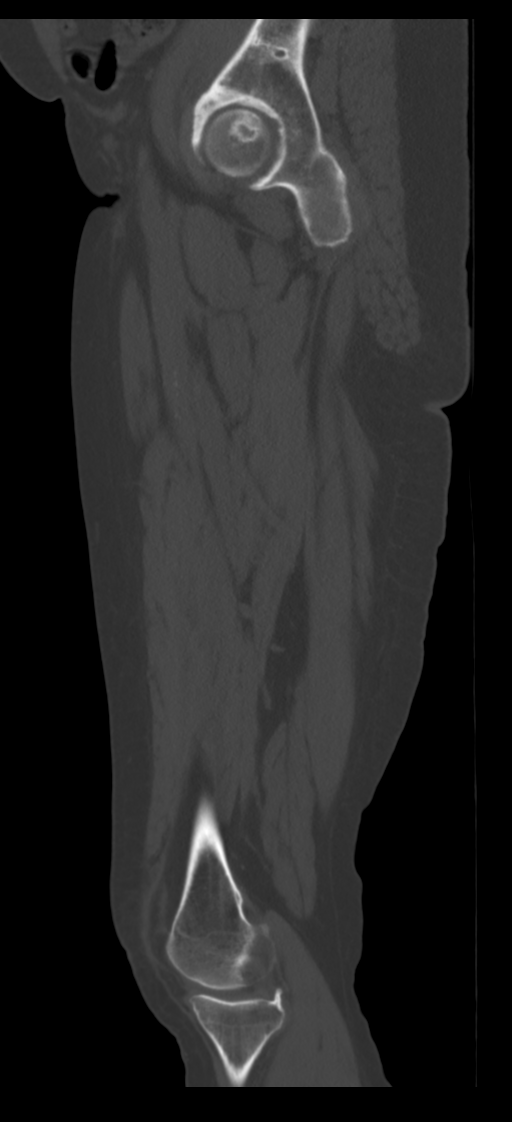
[im 35/70  bone]
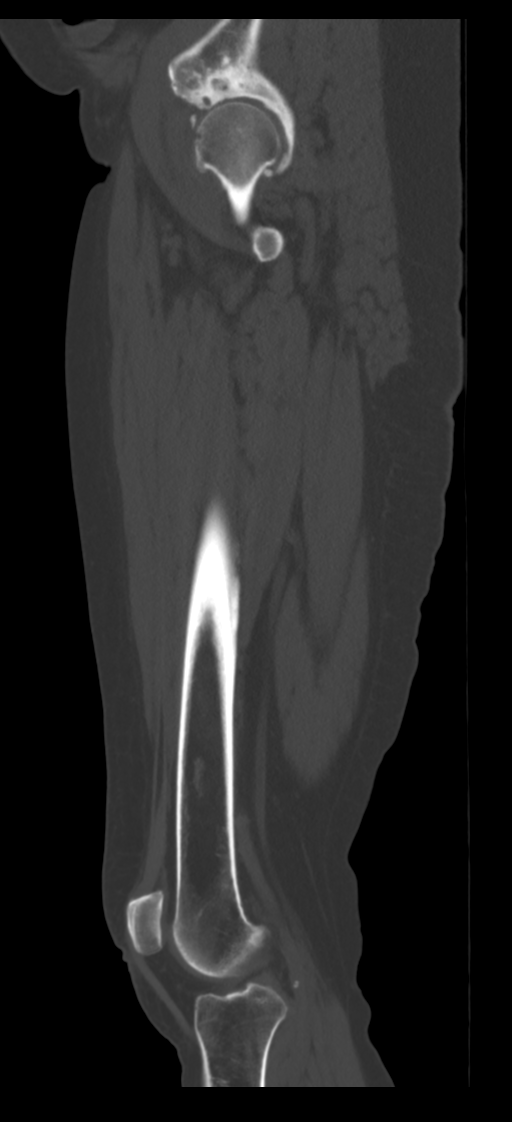
[im 41/70  bone]
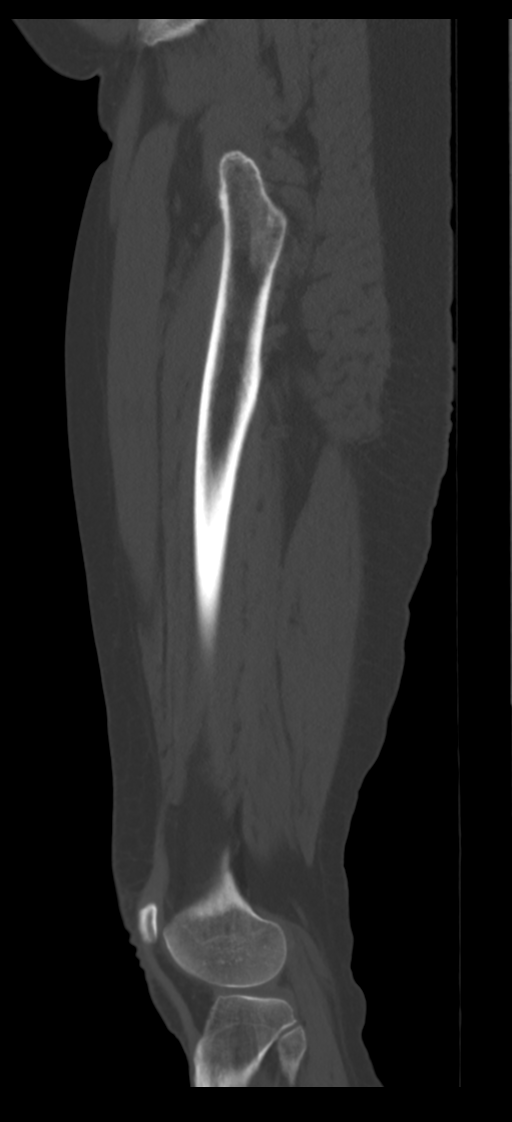
[im 47/70  bone]
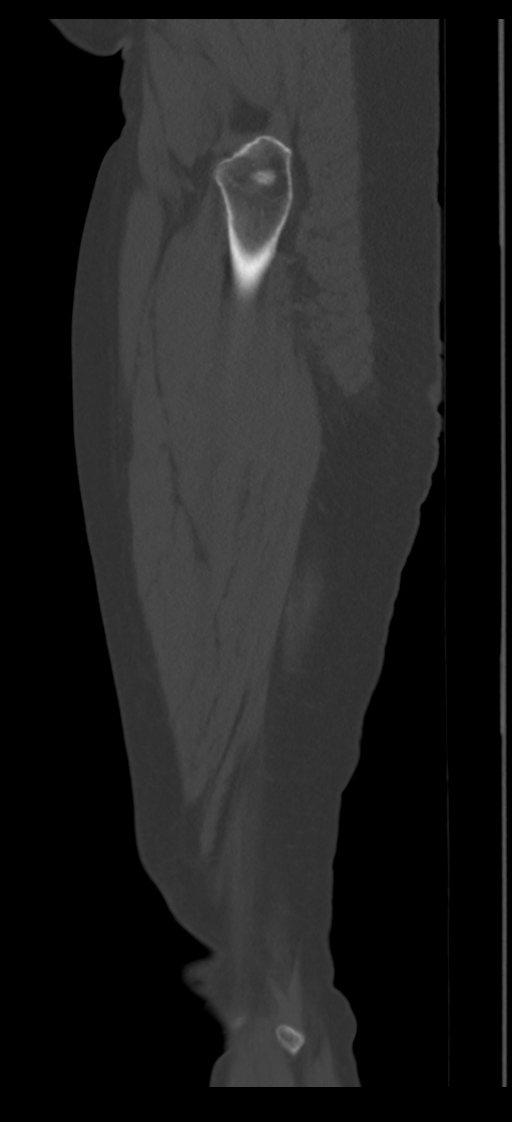

[Series 9: axial st · axial · 0.51mm/px · z∈[-306,-306]mm · 1 of 267 slices shown, 2 images]
[im 144/267  soft-tissue]
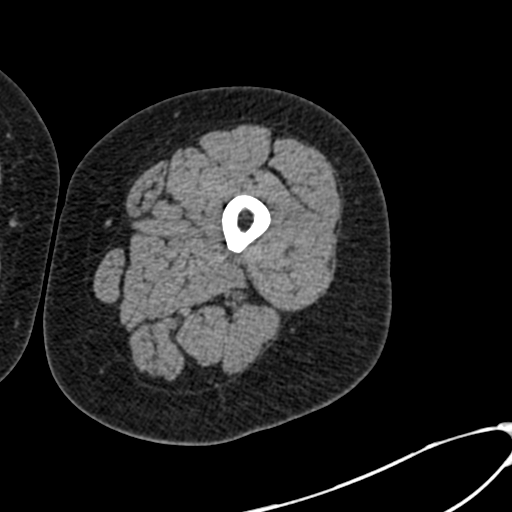
[im 144/267  bone]
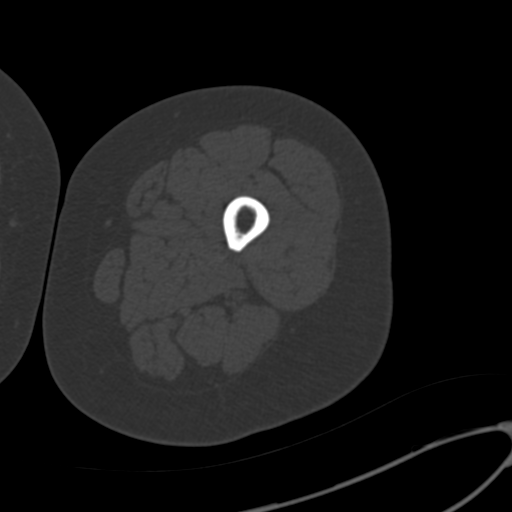

[9 of 33 positions shown; findings below may reference images not displayed]

FINDINGS: Advanced left hip joint degenerative changes for age. There is joint
space narrowing, osteophytic spurring, bony eburnation and
subchondral cystic change. No hip fracture or AVN. The left femur is
intact. No fracture or bone lesion.

The left hip and thigh musculature appear normal. No muscle tear,
muscle mass or findings for myofasciitis.

Scattered atherosclerotic calcifications but no aneurysm. No
subcutaneous soft tissue swelling/ edema/ fluid or mass.
IMPRESSION: Age advanced left hip joint degenerative changes without fracture or
AVN.

No acute bony findings involving the left femur.

Unremarkable CT appearance of the left hip and thigh musculature. No
findings for myofasciitis or cellulitis.

## 2018-03-24 ENCOUNTER — Other Ambulatory Visit: Payer: Self-pay

## 2018-03-24 ENCOUNTER — Emergency Department
Admission: EM | Admit: 2018-03-24 | Discharge: 2018-03-24 | Disposition: A | Discharge status: Home / Self Care | Payer: Self-pay | Attending: Emergency Medicine | Admitting: Emergency Medicine

## 2018-03-24 ENCOUNTER — Encounter: Payer: Self-pay | Admitting: Emergency Medicine

## 2018-03-24 DIAGNOSIS — Z760 Encounter for issue of repeat prescription: Secondary | ICD-10-CM | POA: Insufficient documentation

## 2018-03-24 DIAGNOSIS — H6981 Other specified disorders of Eustachian tube, right ear: Secondary | ICD-10-CM | POA: Insufficient documentation

## 2018-03-24 DIAGNOSIS — Z87891 Personal history of nicotine dependence: Secondary | ICD-10-CM | POA: Insufficient documentation

## 2018-03-24 DIAGNOSIS — H6121 Impacted cerumen, right ear: Secondary | ICD-10-CM | POA: Insufficient documentation

## 2018-03-24 DIAGNOSIS — I10 Essential (primary) hypertension: Secondary | ICD-10-CM | POA: Insufficient documentation

## 2018-03-24 MED ORDER — FLUTICASONE PROPIONATE 50 MCG/ACT NA SUSP: 1.0000 | Freq: Two times a day (BID) | NASAL | 0 refills | Status: DC

## 2018-03-24 MED ORDER — HYDROCHLOROTHIAZIDE 25 MG PO TABS: 25.0000 mg | ORAL_TABLET | Freq: Every day | ORAL | 11 refills | Status: DC

## 2018-03-24 MED ORDER — CARBAMIDE PEROXIDE 6.5 % OT SOLN: 5.0000 [drp] | Freq: Two times a day (BID) | OTIC | 0 refills | Status: AC

## 2018-03-24 MED ORDER — HYDROCHLOROTHIAZIDE 25 MG PO TABS
25.0000 mg | ORAL_TABLET | Freq: Every day | ORAL | Status: DC
  Administered 2018-03-24: 25 mg via ORAL
  Filled 2018-03-24: qty 1

## 2018-03-24 MED ORDER — AMLODIPINE BESYLATE 10 MG PO TABS: 10.0000 mg | ORAL_TABLET | Freq: Every day | ORAL | 11 refills | Status: DC

## 2018-03-24 MED ORDER — AMLODIPINE BESYLATE 5 MG PO TABS
10.0000 mg | ORAL_TABLET | Freq: Once | ORAL | Status: AC
  Administered 2018-03-24: 10 mg via ORAL
  Filled 2018-03-24: qty 2

## 2018-03-24 NOTE — ED Notes (Signed)
Pt received dc instructions, had no questions, reviewed need for f/u and rx's prior to discharge.

## 2018-03-24 NOTE — ED Triage Notes (Signed)
Pt arrived via POV with reports of right ear pain that started earlier this week, pt states she gets intermittent sharp pains. Denies any drainage, states painful to sleep on the side.  Pt has not been taking BP medications, unable to afford them and does not have any refills left.

## 2018-03-24 NOTE — ED Provider Notes (Signed)
[facility] Emergency Department Provider Note  ____________________________________________  Time seen: Approximately 5:42 PM  I have reviewed the triage vital signs and the nursing notes.   HISTORY  Chief Complaint Otalgia    HPI Kathlen ModyWanda S Smeal is a 68 y.o. female who presents the emergency department for 2 complaints.  First complaint is right ear pain and fullness.  Patient reports that over the past several days she has had a pressure sensation to the right ear as well as mild decreased hearing.  Patient reports that it "almost feels like some things in there, even though I know others not."  Patient reports that she has significant allergic rhinitis but does not take any medication for same.  She has had similar complaints with her right ear has been told it is related to her sinuses.  Patient is also requesting refill of her blood pressure medications.  Patient does have a blood pressure of 200/90 in the emergency department.  Patient reports that she is anxious but she is also of both of her blood pressure medications.  She takes HCTZ and Norvasc on a daily basis.  No other complaints at this time.    Past Medical History:  Diagnosis Date  . Atrial flutter (HCC) 09/2015   a. CHADS2VASc 2 (HTN, female); b. on Eliquis; c. echo 7/17: EF 50-55%, no RWMA, rhythm atrial flutter, mildly dilated LA/RV, moderately to severely dilated RA, trivial pericardial effusion  . Essential hypertension   . GERD (gastroesophageal reflux disease)   . Subclinical hypothyroidism   . Tobacco abuse     Patient Active Problem List   Diagnosis Date Noted  . Atrial flutter (HCC) 01/16/2016  . SOB (shortness of breath)   . Subclinical hypothyroidism   . Typical atrial flutter (HCC) 09/24/2015  . Essential hypertension 09/23/2015  . Tobacco abuse 09/23/2015    Past Surgical History:  Procedure Laterality Date  . ABDOMINAL HYSTERECTOMY    . ABLATION OF DYSRHYTHMIC FOCUS   01/16/2016  . ELECTROPHYSIOLOGIC STUDY N/A 11/11/2015   Procedure: CARDIOVERSION;  Surgeon: Antonieta Ibaimothy J Gollan, MD;  Location: [facility] ORS;  Service: Cardiovascular;  Laterality: N/A;  . ELECTROPHYSIOLOGIC STUDY N/A 01/16/2016   Procedure: A-Flutter Ablation;  Surgeon: Duke SalviaSteven C Klein, MD;  [facility];  Service: Cardiovascular;  Laterality: N/A;    Prior to Admission medications   Medication Sig Start Date End Date Taking? Authorizing Provider  acetaminophen (TYLENOL) 500 MG tablet Take 500 mg by mouth every 6 (six) hours as needed for moderate pain or headache.    [provider]  amLODipine (NORVASC) 10 MG tablet TAKE ONE TABLET BY MOUTH ONCE DAILY 12/13/16   Duke SalviaKlein, Steven C, MD  amLODipine (NORVASC) 10 MG tablet Take 1 tablet (10 mg total) by mouth daily. 03/24/18 03/24/19  Taliyah Watrous, Delorise RoyalsJonathan D, PA-C  carbamide peroxide (DEBROX) 6.5 % OTIC solution Place 5 drops into the right ear 2 (two) times daily. 03/24/18 03/24/19  Shetara Launer, Delorise RoyalsJonathan D, PA-C  cholecalciferol (VITAMIN D) 1000 units tablet Take 1,000 Units by mouth daily.    [provider]  fluticasone (FLONASE) 50 MCG/ACT nasal spray Place 1 spray into both nostrils 2 (two) times daily. 03/24/18   Telma Pyeatt, Delorise RoyalsJonathan D, PA-C  hydrochlorothiazide (HYDRODIURIL) 25 MG tablet Take 1 tablet (25 mg total) by mouth 2 (two) times daily. 11/23/16   Iran OuchArida, Muhammad A, MD  hydrochlorothiazide (HYDRODIURIL) 25 MG tablet Take 1 tablet (25 mg total) by mouth daily. 03/24/18   Mishawn Hemann, Delorise RoyalsJonathan D,  PA-C  ibuprofen (ADVIL,MOTRIN) 600 MG tablet Take 1 tablet (600 mg total) by mouth every 8 (eight) hours as needed. 12/13/16   Merrily Brittle, MD  levothyroxine (SYNTHROID, LEVOTHROID) 88 MCG tablet Take 88 mcg by mouth daily before breakfast.  10/24/15 11/22/16  [provider]  oxyCODONE-acetaminophen (ROXICET) 5-325 MG tablet Take 1 tablet by mouth every 6 (six) hours as needed for severe pain. 12/13/16   Merrily Brittle, MD     Allergies Lisinopril and Amoxicillin  Family History  Problem Relation Age of Onset  . Hypertension Mother   . Diabetes Mother   . Diabetes Father   . Diabetes Brother   . Hypertension Brother   . Hypertension Brother   . Heart attack Brother   . Diabetes Brother     Social History Social History   Tobacco Use  . Smoking status: Former Smoker    Last attempt to quit: 11/19/2015    Years since quitting: 2.3  . Smokeless tobacco: Never Used  Substance Use Topics  . Alcohol use: Yes    Comment: occasionally  . Drug use: No     Review of Systems  Constitutional: No fever/chills Eyes: No visual changes. No discharge ENT: Positive for right ear pain. Cardiovascular: no chest pain. Respiratory: no cough. No SOB. Gastrointestinal: No abdominal pain.  No nausea, no vomiting.   Musculoskeletal: Negative for musculoskeletal pain. Skin: Negative for rash, abrasions, lacerations, ecchymosis. Neurological: Negative for headaches, focal weakness or numbness. 10-point ROS otherwise negative.  ____________________________________________   PHYSICAL EXAM:  VITAL SIGNS: ED Triage Vitals  Enc Vitals Group     BP 03/24/18 1641 (!) 199/90     Pulse Rate 03/24/18 1641 (!) 51     Resp --      Temp 03/24/18 1641 98.3 F (36.8 C)     Temp Source 03/24/18 1641 Oral     SpO2 03/24/18 1641 98 %     Weight 03/24/18 1641 155 lb (70.3 kg)     Height 03/24/18 1641 5\' 5"  (1.651 m)     Head Circumference --      Peak Flow --      Pain Score 03/24/18 1649 10     Pain Loc --      Pain Edu? --      Excl. in GC? --      Constitutional: Alert and oriented. Well appearing and in no acute distress. Eyes: Conjunctivae are normal. PERRL. EOMI. Head: Atraumatic. ENT:      Ears: EAC and TM on left is unremarkable.  Patient does have excessive cerumen in the right EAC without complete impaction.  TM is visualized.  This is bulging with no injection or air-fluid level.      Nose: No  congestion/rhinnorhea.  Turbinates are boggy.      Mouth/Throat: Mucous membranes are moist.  Neck: No stridor.   Hematological/Lymphatic/Immunilogical: No cervical lymphadenopathy. Cardiovascular: Normal rate, regular rhythm. Normal S1 and S2.  Good peripheral circulation. Respiratory: Normal respiratory effort without tachypnea or retractions. Lungs CTAB. Good air entry to the bases with no decreased or absent breath sounds. Musculoskeletal: Full range of motion to all extremities. No gross deformities appreciated. Neurologic:  Normal speech and language. No gross focal neurologic deficits are appreciated.  Skin:  Skin is warm, dry and intact. No rash noted. Psychiatric: Mood and affect are normal. Speech and behavior are normal. Patient exhibits appropriate insight and judgement.   ____________________________________________   LABS (all labs ordered are listed, but only  abnormal results are displayed)  Labs Reviewed - No data to display ____________________________________________  EKG   ____________________________________________  RADIOLOGY   No results found.  ____________________________________________    PROCEDURES  Procedure(s) performed:    Procedures    Medications  hydrochlorothiazide (HYDRODIURIL) tablet 25 mg (has no administration in time range)  amLODipine (NORVASC) tablet 10 mg (has no administration in time range)     ____________________________________________   INITIAL IMPRESSION / ASSESSMENT AND PLAN / ED COURSE  Pertinent labs & imaging results that were available during my care of the patient were reviewed by me and considered in my medical decision making (see chart for details).  Review of the [place] CSRS was performed in accordance of the [facility] prior to dispensing any controlled drugs.      Patient's diagnosis is consistent with eustachian tube dysfunction, increased cerumen, medication refill.  Patient presents emergency department  with right ear pressure, muffled hearing.  Patient does have excessive amounts of cerumen in the right ear without definitive impaction.  TM appears bulging consistent with eustachian tube dysfunction from allergic rhinitis.  Patient will be placed on Flonase and Debrox for ear complaint.  Patient does have a history of essential hypertension has run out of her antihypertensive medications.  Patient was hypertensive in the emergency department with no other acute symptoms or findings.  No indication of acute hypertensive emergency/urgency.  Patient is given dose of her blood pressure medications in the emergency department and will be discharged with prescriptions for her HCTZ and Norvasc..  Follow-up primary care as needed.  Patient is given ED precautions to return to the ED for any worsening or new symptoms.     ____________________________________________  FINAL CLINICAL IMPRESSION(S) / ED DIAGNOSES  Final diagnoses:  Eustachian tube dysfunction, right  Excessive cerumen in right ear canal  Medication refill      NEW MEDICATIONS STARTED DURING THIS VISIT:  ED Discharge Orders         Ordered    fluticasone (FLONASE) 50 MCG/ACT nasal spray  2 times daily     03/24/18 1757    carbamide peroxide (DEBROX) 6.5 % OTIC solution  2 times daily     03/24/18 1757    hydrochlorothiazide (HYDRODIURIL) 25 MG tablet  Daily     03/24/18 1757    amLODipine (NORVASC) 10 MG tablet  Daily     03/24/18 1757              This chart was dictated using voice recognition software/Dragon. Despite best efforts to proofread, errors can occur which can change the meaning. Any change was purely unintentional.    Racheal PatchesCuthriell, Kinneth Fujiwara D, PA-C 03/24/18 1804    Minna AntisPaduchowski, Kevin, MD 03/24/18 [id number]

## 2018-03-24 NOTE — ED Notes (Signed)
See triage note  Presents with right ear pain  Pain started on Monday  Worse today  No fever or drainage

## 2018-03-24 NOTE — ED Triage Notes (Signed)
First Nurse Note:  C/O right ear pain x 2-3 days.   AAOx3.  Skin ware and dry. NAD

## 2019-02-24 ENCOUNTER — Emergency Department
Admission: EM | Admit: 2019-02-24 | Discharge: 2019-02-24 | Disposition: A | Discharge status: Home / Self Care | Payer: Self-pay | Attending: Emergency Medicine | Admitting: Emergency Medicine

## 2019-02-24 ENCOUNTER — Other Ambulatory Visit: Payer: Self-pay

## 2019-02-24 ENCOUNTER — Emergency Department: Payer: Self-pay

## 2019-02-24 DIAGNOSIS — R0602 Shortness of breath: Secondary | ICD-10-CM | POA: Insufficient documentation

## 2019-02-24 DIAGNOSIS — E038 Other specified hypothyroidism: Secondary | ICD-10-CM | POA: Insufficient documentation

## 2019-02-24 DIAGNOSIS — Z79899 Other long term (current) drug therapy: Secondary | ICD-10-CM | POA: Insufficient documentation

## 2019-02-24 DIAGNOSIS — Z20828 Contact with and (suspected) exposure to other viral communicable diseases: Secondary | ICD-10-CM | POA: Insufficient documentation

## 2019-02-24 DIAGNOSIS — Z20822 Contact with and (suspected) exposure to covid-19: Secondary | ICD-10-CM

## 2019-02-24 DIAGNOSIS — Z87891 Personal history of nicotine dependence: Secondary | ICD-10-CM | POA: Insufficient documentation

## 2019-02-24 DIAGNOSIS — I10 Essential (primary) hypertension: Secondary | ICD-10-CM | POA: Insufficient documentation

## 2019-02-24 DIAGNOSIS — R0981 Nasal congestion: Secondary | ICD-10-CM | POA: Insufficient documentation

## 2019-02-24 MED ORDER — HYDROCODONE-HOMATROPINE 5-1.5 MG/5ML PO SYRP: 5.0000 mL | ORAL_SOLUTION | Freq: Four times a day (QID) | ORAL | 0 refills | Status: DC | PRN

## 2019-02-24 MED ORDER — DEXAMETHASONE SODIUM PHOSPHATE 10 MG/ML IJ SOLN
10.0000 mg | Freq: Once | INTRAMUSCULAR | Status: AC
  Administered 2019-02-24: 08:00:00 10 mg via INTRAVENOUS
  Filled 2019-02-24: qty 1

## 2019-02-24 MED ORDER — ALBUTEROL SULFATE HFA 108 (90 BASE) MCG/ACT IN AERS: INHALATION_SPRAY | RESPIRATORY_TRACT | 1 refills | Status: DC

## 2019-02-24 NOTE — ED Provider Notes (Signed)
[facility] Emergency Department Provider Note  ____________________________________________   First MD Initiated Contact with Patient 02/24/19 [phone]     (approximate)  I have reviewed the triage vital signs and the nursing notes.   HISTORY  Chief Complaint Shortness of Breath    HPI [name] is a 68 y.o. female with medical history as listed below who presents by EMS for evaluation of shortness of breath and wheezing.  She said that she developed symptoms about 3 days ago and they have gradually gotten worse.  She said that this happens to her couple times a year usually with the weather change, and usually if she starts taking Flonase right away it seems to help.  She cannot find her Flonase until yesterday, however, so she thinks she started too late.  She reports nasal congestion, mild sore throat, and some mild shortness of breath.  She says she occasionally has a cough but she is not coughing up anything.  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  She has had no known contacts.  She denies fever/chills, nausea, vomiting, abdominal pain, and body aches.  She has been a longtime smoker but she stopped about 8 months ago and she has no diagnosis of COPD.  Symptoms are anywhere from mild to moderate in intensity.         Past Medical History:  Diagnosis Date  . Atrial flutter (HCC) 09/2015   a. CHADS2VASc 2 (HTN, female); b. on Eliquis; c. echo 7/17: EF 50-55%, no RWMA, rhythm atrial flutter, mildly dilated LA/RV, moderately to severely dilated RA, trivial pericardial effusion  . Essential hypertension   . GERD (gastroesophageal reflux disease)   . Subclinical hypothyroidism   . Tobacco abuse     Patient Active Problem List   Diagnosis Date Noted  . Atrial flutter (HCC) 01/16/2016  . SOB (shortness of breath)   . Subclinical  hypothyroidism   . Typical atrial flutter (HCC) 09/24/2015  . Essential hypertension 09/23/2015  . Tobacco abuse 09/23/2015    Past Surgical History:  Procedure Laterality Date  . ABDOMINAL HYSTERECTOMY    . ABLATION OF DYSRHYTHMIC FOCUS  01/16/2016  . ELECTROPHYSIOLOGIC STUDY N/A 11/11/2015   Procedure: CARDIOVERSION;  Surgeon: Antonieta Iba, MD;  Location: [facility] ORS;  Service: Cardiovascular;  Laterality: N/A;  . ELECTROPHYSIOLOGIC STUDY N/A 01/16/2016   Procedure: A-Flutter Ablation;  Surgeon: Duke Salvia, MD;  Location: [facility] INVASIVE CV LAB;  Service: Cardiovascular;  Laterality: N/A;    Prior to Admission medications   Medication Sig Start Date End Date Taking? Authorizing Provider  acetaminophen (TYLENOL) 500 MG tablet Take 500 mg by mouth every 6 (six) hours as needed for moderate pain or headache.    [provider]  albuterol (VENTOLIN HFA) 108 (90 Base) MCG/ACT inhaler Inhale 2-4 puffs by mouth every 4 hours as needed for wheezing, cough, and/or shortness of breath 02/24/19   Loleta Rose, MD  amLODipine (NORVASC) 10 MG tablet TAKE ONE TABLET BY MOUTH ONCE DAILY 12/13/16   Duke Salvia, MD  amLODipine (NORVASC) 10 MG tablet Take 1 tablet (10 mg total) by mouth daily. 03/24/18 03/24/19  Cuthriell, Delorise Royals, PA-C  carbamide peroxide (DEBROX) 6.5 % OTIC solution Place 5 drops into the right ear 2 (two) times daily. 03/24/18 03/24/19  Cuthriell, Delorise Royals, PA-C  cholecalciferol (VITAMIN D) 1000 units tablet  Take 1,000 Units by mouth daily.    [provider]  fluticasone (FLONASE) 50 MCG/ACT nasal spray Place 1 spray into both nostrils 2 (two) times daily. 03/24/18   Cuthriell, Delorise Royals, PA-C  hydrochlorothiazide (HYDRODIURIL) 25 MG tablet Take 1 tablet (25 mg total) by mouth 2 (two) times daily. 11/23/16   Iran Ouch, MD  hydrochlorothiazide (HYDRODIURIL) 25 MG tablet Take 1 tablet (25 mg total) by mouth daily. 03/24/18   Cuthriell, Delorise Royals, PA-C    HYDROcodone-homatropine (HYCODAN) 5-1.5 MG/5ML syrup Take 5 mLs by mouth every 6 (six) hours as needed for cough. 02/24/19   Loleta Rose, MD  ibuprofen (ADVIL,MOTRIN) 600 MG tablet Take 1 tablet (600 mg total) by mouth every 8 (eight) hours as needed. 12/13/16   Merrily Brittle, MD  levothyroxine (SYNTHROID, LEVOTHROID) 88 MCG tablet Take 88 mcg by mouth daily before breakfast.  10/24/15 11/22/16  [provider]  oxyCODONE-acetaminophen (ROXICET) 5-325 MG tablet Take 1 tablet by mouth every 6 (six) hours as needed for severe pain. 12/13/16   Merrily Brittle, MD    Allergies Lisinopril and Amoxicillin  Family History  Problem Relation Age of Onset  . Hypertension Mother   . Diabetes Mother   . Diabetes Father   . Diabetes Brother   . Hypertension Brother   . Hypertension Brother   . Heart attack Brother   . Diabetes Brother     Social History Social History   Tobacco Use  . Smoking status: Former Smoker    Quit date: 11/19/2015    Years since quitting: 3.2  . Smokeless tobacco: Never Used  Substance Use Topics  . Alcohol use: Yes    Comment: occasionally  . Drug use: No    Review of Systems Constitutional: No fever/chills.  No significant malaise or body aches. Eyes: No visual changes. ENT: +sore throat.  Nasal congestion and runny nose. Cardiovascular: Denies chest pain. Respiratory: Shortness of breath and dry cough. Gastrointestinal: No abdominal pain.  No nausea, no vomiting.  No diarrhea.  No constipation. Genitourinary: Negative for dysuria. Musculoskeletal: Negative for neck pain.  Negative for back pain. Integumentary: Negative for rash. Neurological: Negative for headaches, focal weakness or numbness.   ____________________________________________   PHYSICAL EXAM:  VITAL SIGNS: ED Triage Vitals  Enc Vitals Group     BP 02/24/19 0512 (!) 151/90     Pulse Rate 02/24/19 0512 79     Resp 02/24/19 0512 16     Temp 02/24/19 0523 98.5 F (36.9 C)      Temp Source 02/24/19 0523 Oral     SpO2 02/24/19 0512 100 %     Weight 02/24/19 0513 72.6 kg (160 lb)     Height 02/24/19 0513 1.651 m (5\' 5" )     Head Circumference --      Peak Flow --      Pain Score 02/24/19 0513 0     Pain Loc --      Pain Edu? --      Excl. in GC? --     Constitutional: Alert and oriented.  No acute distress. Eyes: Conjunctivae are normal.  Head: Atraumatic. Nose: +congestion Mouth/Throat: Patient is wearing a mask. Neck: No stridor.  No meningeal signs.   Cardiovascular: Normal rate, regular rhythm. Good peripheral circulation. Grossly normal heart sounds. Respiratory: Normal respiratory effort.  No retractions.  Lungs are clear to stethoscope auscultation with no wheezing and no rales nor rhonchi.  I did not observe any coughing during my  assessment. Gastrointestinal: Soft and nontender. No distention.  Musculoskeletal: No lower extremity tenderness nor edema. No gross deformities of extremities. Neurologic:  Normal speech and language. No gross focal neurologic deficits are appreciated.  Skin:  Skin is warm, dry and intact. Psychiatric: Mood and affect are normal. Speech and behavior are normal.  ____________________________________________   LABS (all labs ordered are listed, but only abnormal results are displayed)  Labs Reviewed  NOVEL CORONAVIRUS, NAA (HOSP ORDER, SEND-OUT TO REF LAB; TAT 18-24 HRS)   ____________________________________________  EKG  None - EKG not ordered by ED physician ____________________________________________  RADIOLOGY Marylou MccoyI, Oliveah Zwack, personally viewed and evaluated these images (plain radiographs) as part of my medical decision making, as well as reviewing the written report by the radiologist.  ED MD interpretation: No indication of acute abnormality on chest x-ray  Official radiology report(s): DG Chest Portable 1 View  Result Date: 02/24/2019 CLINICAL DATA:  Pt states that she woke up unable to catch  her breath, states no hx of any lung conditionsSOB, wheezing EXAM: PORTABLE CHEST 1 VIEW COMPARISON:  Some 11 17 FINDINGS: Large globular cardiac silhouette. Calcification of the aortic arch. No pulmonary edema pleural fluid. No pneumonia. No pneumothorax. IMPRESSION: 1. No acute cardiopulmonary process. 2. Enlarged cardiac silhouette and calcified aortic arch. Electronically Signed   By: Genevive BiStewart  Edmunds M.D.   On: 02/24/2019 07:09    ____________________________________________   PROCEDURES   Procedure(s) performed (including Critical Care):  Procedures   ____________________________________________   INITIAL IMPRESSION / MDM / ASSESSMENT AND PLAN / ED COURSE  As part of my medical decision making, I reviewed the following data within the electronic MEDICAL RECORD NUMBER Nursing notes reviewed and incorporated, Old chart reviewed, Radiograph reviewed , Notes from prior ED visits and [place] Controlled Substance Database   Differential diagnosis includes, but is not limited to, COVID-19, allergic rhinitis, nonspecific viral infection, pneumonia.  The patient is well-appearing and in no distress.  Lung sounds are clear without wheezing.  Chest x-ray is clear with no evidence of bacterial or viral process.  Medication for labs at this time.  I discussed with the patient the possibility that this could be COVID-19 and she agrees to outpatient testing.  I went over my usual and customary management recommendations and return precautions.  I am treating with a one-time dose of Decadron given her extensive smoking history as I think this will help with her symptoms and I am also giving her prescription for cough medicine and a prescription for an albuterol inhaler.  She understands and agrees with the plan.          ____________________________________________  FINAL CLINICAL IMPRESSION(S) / ED DIAGNOSES  Final diagnoses:  Nasal congestion  Shortness of breath  Suspected 2019 novel coronavirus  infection     MEDICATIONS GIVEN DURING THIS VISIT:  Medications  dexamethasone (DECADRON) injection 10 mg (has no administration in time range)     ED Discharge Orders         Ordered    albuterol (VENTOLIN HFA) 108 (90 Base) MCG/ACT inhaler     02/24/19 0720    HYDROcodone-homatropine (HYCODAN) 5-1.5 MG/5ML syrup  Every 6 hours PRN     02/24/19 0720          *Please note:  Kathlen ModyWanda S Fukushima was evaluated in Emergency Department on 02/24/2019 for the symptoms described in the history of present illness. She was evaluated in the context of the global COVID-19 pandemic, which necessitated consideration that the patient might  be at risk for infection with the SARS-CoV-2 virus that causes COVID-19. Institutional protocols and algorithms that pertain to the evaluation of patients at risk for COVID-19 are in a state of rapid change based on information released by regulatory bodies including the CDC and federal and state organizations. These policies and algorithms were followed during the patient's care in the ED.  Some ED evaluations and interventions may be delayed as a result of limited staffing during the pandemic.*  Note:  This document was prepared using Dragon voice recognition software and may include unintentional dictation errors.   Hinda Kehr, MD 02/24/19 [phone]

## 2019-02-24 NOTE — ED Triage Notes (Signed)
Pt arrives to ED from home via [facility] with c/c of shortness of breath and wheezing. Pt states Sx onset on Wednesday and have gotten progressively worse. She woke up in the middle of the night with difficulty breathing. EMS reports transport vitals of 159/74, p 80, 96% on room air. Pt A&Ox4, NAD. Pt denies f/c NVD.

## 2019-02-24 NOTE — Discharge Instructions (Signed)
As we discussed, we believe your symptoms are caused by a respiratory virus.  However, because we cannot rule out the possibility of COVID-19 at this time, we sent a nasal swab test from the Emergency Department (ED).  Please read through the included information in this paperwork for information about how to set up a MyChart account so that you can log in over the next couple of days for your test results (including COVID-19 swab results if one was obtained during your Emergency Department visit).  Read through all the included information including the recommendations from the CDC.  If your test is positive, we recommend that you self-quarantine at home for 10-14 days after your fever has gone away (without taking medication to make your temperature come down, such as Tylenol (acetaminophen)), after your respiratory symptoms have improved, and after at least 14 days have passed since your symptoms first appeared.  You should have as minimal contact as possible with anyone else including close family as per the CDC paperwork guidelines listed below. Follow-up with your doctor by phone or online as needed and return immediately to the emergency department or call 911 only if you develop new or worsening symptoms that concern you.  You can find up-to-date information about COVID-19 in [place] by calling the [place] Coronavirus Helpline: 1-866-462-3821. You may also call 2-1-1, or 888-892-1162, or additional resources.  You can also find information online at https://www.ncdhhs.gov/divisions/public-health/coronavirus-disease-2019-covid-19-response[place], or on the Center for Disease Control (CDC) website at https://www.cdc.gov/coronavirus/2019-ncov/index.html.  

## 2019-02-24 NOTE — ED Notes (Signed)
Pt verbalized understanding of discharge instructions. NAD at this time. 

## 2019-02-25 LAB — NOVEL CORONAVIRUS, NAA (HOSP ORDER, SEND-OUT TO REF LAB; TAT 18-24 HRS): SARS-CoV-2, NAA: NOT DETECTED

## 2019-03-19 ENCOUNTER — Emergency Department
Admission: EM | Admit: 2019-03-19 | Discharge: 2019-03-19 | Disposition: A | Discharge status: Home / Self Care | Payer: Self-pay | Attending: Emergency Medicine | Admitting: Emergency Medicine

## 2019-03-19 ENCOUNTER — Encounter: Payer: Self-pay | Admitting: Medical Oncology

## 2019-03-19 ENCOUNTER — Emergency Department: Payer: Self-pay

## 2019-03-19 ENCOUNTER — Other Ambulatory Visit: Payer: Self-pay

## 2019-03-19 DIAGNOSIS — M5412 Radiculopathy, cervical region: Secondary | ICD-10-CM | POA: Insufficient documentation

## 2019-03-19 DIAGNOSIS — Z79899 Other long term (current) drug therapy: Secondary | ICD-10-CM | POA: Insufficient documentation

## 2019-03-19 DIAGNOSIS — Z87891 Personal history of nicotine dependence: Secondary | ICD-10-CM | POA: Insufficient documentation

## 2019-03-19 DIAGNOSIS — N390 Urinary tract infection, site not specified: Secondary | ICD-10-CM | POA: Insufficient documentation

## 2019-03-19 DIAGNOSIS — I10 Essential (primary) hypertension: Secondary | ICD-10-CM | POA: Insufficient documentation

## 2019-03-19 LAB — URINALYSIS, ROUTINE W REFLEX MICROSCOPIC
Bacteria, UA: NONE SEEN
Bilirubin Urine: NEGATIVE
Glucose, UA: NEGATIVE mg/dL
Ketones, ur: NEGATIVE mg/dL
Nitrite: NEGATIVE
Protein, ur: 30 mg/dL — AB
Specific Gravity, Urine: 1.018 (ref 1.005–1.030)
pH: 5 (ref 5.0–8.0)

## 2019-03-19 MED ORDER — TRAMADOL HCL 50 MG PO TABS: 50.0000 mg | ORAL_TABLET | Freq: Four times a day (QID) | ORAL | 0 refills | Status: DC | PRN

## 2019-03-19 MED ORDER — PREDNISONE 10 MG PO TABS: ORAL_TABLET | ORAL | 0 refills | Status: DC

## 2019-03-19 MED ORDER — OXYCODONE-ACETAMINOPHEN 5-325 MG PO TABS
1.0000 | ORAL_TABLET | Freq: Once | ORAL | Status: AC
  Administered 2019-03-19: 12:00:00 1 via ORAL
  Filled 2019-03-19: qty 1

## 2019-03-19 MED ORDER — KETOROLAC TROMETHAMINE 30 MG/ML IJ SOLN
30.0000 mg | Freq: Once | INTRAMUSCULAR | Status: AC
  Administered 2019-03-19: 30 mg via INTRAMUSCULAR
  Filled 2019-03-19: qty 1

## 2019-03-19 MED ORDER — SULFAMETHOXAZOLE-TRIMETHOPRIM 800-160 MG PO TABS: 1.0000 | ORAL_TABLET | Freq: Two times a day (BID) | ORAL | 0 refills | Status: DC

## 2019-03-19 NOTE — ED Provider Notes (Signed)
[facility] Emergency Department Provider Note   ____________________________________________   First MD Initiated Contact with Patient 03/19/19 1140     (approximate)  I have reviewed the triage vital signs and the nursing notes.   HISTORY  Chief Complaint Shoulder Pain   HPI [name] is a 69 y.o. female presents to the ED with complaint of right shoulder pain for 1 week without known history of injury.  Patient states that pain is increased with range of motion.  She also reports that she saw some blood in her urine yesterday.  She states that she "does not feel good".  She denies any fever, chills, nausea or vomiting.  She has had urinary tract infections in the past but not for years.  Patient has not been taking any over-the-counter medication for her shoulder.  Patient is right-hand dominant.  She rates her pain as 10/10.     Past Medical History:  Diagnosis Date  . Atrial flutter (HCC) 09/2015   a. CHADS2VASc 2 (HTN, female); b. on Eliquis; c. echo 7/17: EF 50-55%, no RWMA, rhythm atrial flutter, mildly dilated LA/RV, moderately to severely dilated RA, trivial pericardial effusion  . Essential hypertension   . GERD (gastroesophageal reflux disease)   . Subclinical hypothyroidism   . Tobacco abuse     Patient Active Problem List   Diagnosis Date Noted  . Atrial flutter (HCC) 01/16/2016  . SOB (shortness of breath)   . Subclinical hypothyroidism   . Typical atrial flutter (HCC) 09/24/2015  . Essential hypertension 09/23/2015  . Tobacco abuse 09/23/2015    Past Surgical History:  Procedure Laterality Date  . ABDOMINAL HYSTERECTOMY    . ABLATION OF DYSRHYTHMIC FOCUS  01/16/2016  . ELECTROPHYSIOLOGIC STUDY N/A 11/11/2015   Procedure: CARDIOVERSION;  Surgeon: Antonieta Iba, MD;  Location: [facility] ORS;  Service: Cardiovascular;  Laterality: N/A;  . ELECTROPHYSIOLOGIC STUDY N/A 01/16/2016   Procedure: A-Flutter Ablation;  Surgeon: Duke Salvia, MD;  Location: [facility] INVASIVE CV LAB;  Service: Cardiovascular;  Laterality: N/A;    Prior to Admission medications   Medication Sig Start Date End Date Taking? Authorizing Provider  acetaminophen (TYLENOL) 500 MG tablet Take 500 mg by mouth every 6 (six) hours as needed for moderate pain or headache.    [provider]  albuterol (VENTOLIN HFA) 108 (90 Base) MCG/ACT inhaler Inhale 2-4 puffs by mouth every 4 hours as needed for wheezing, cough, and/or shortness of breath 02/24/19   Loleta Rose, MD  amLODipine (NORVASC) 10 MG tablet TAKE ONE TABLET BY MOUTH ONCE DAILY 12/13/16   Duke Salvia, MD  amLODipine (NORVASC) 10 MG tablet Take 1 tablet (10 mg total) by mouth daily. 03/24/18 03/24/19  Cuthriell, Delorise Royals, PA-C  carbamide peroxide (DEBROX) 6.5 % OTIC solution Place 5 drops into the right ear 2 (two) times daily. 03/24/18 03/24/19  Cuthriell, Delorise Royals, PA-C  cholecalciferol (VITAMIN D) 1000 units tablet Take 1,000 Units by mouth daily.    [provider]  fluticasone (FLONASE) 50 MCG/ACT nasal spray Place 1 spray into both nostrils 2 (two) times daily. 03/24/18   Cuthriell, Delorise Royals, PA-C  hydrochlorothiazide (HYDRODIURIL) 25 MG tablet Take 1 tablet (25 mg total) by mouth 2 (two) times daily. 11/23/16   Iran Ouch, MD  hydrochlorothiazide (HYDRODIURIL) 25 MG tablet Take 1 tablet (25 mg total) by mouth daily. 03/24/18   Cuthriell, Delorise Royals, PA-C  levothyroxine (SYNTHROID, LEVOTHROID) 88 MCG tablet Take 88 mcg by mouth  daily before breakfast.  10/24/15 11/22/16  [provider]  predniSONE (DELTASONE) 10 MG tablet Take 6 tablets  today, on day 2 take 5 tablets, day 3 take 4 tablets, day 4 take 3 tablets, day 5 take  2 tablets and 1 tablet the last day 03/19/19   Tommi Rumps, PA-C  sulfamethoxazole-trimethoprim (BACTRIM DS) 800-160 MG tablet Take 1 tablet by mouth 2 (two) times daily. 03/19/19   Tommi Rumps, PA-C  traMADol (ULTRAM) 50 MG tablet Take 1  tablet (50 mg total) by mouth every 6 (six) hours as needed. 03/19/19   Tommi Rumps, PA-C    Allergies Lisinopril and Amoxicillin  Family History  Problem Relation Age of Onset  . Hypertension Mother   . Diabetes Mother   . Diabetes Father   . Diabetes Brother   . Hypertension Brother   . Hypertension Brother   . Heart attack Brother   . Diabetes Brother     Social History Social History   Tobacco Use  . Smoking status: Former Smoker    Quit date: 11/19/2015    Years since quitting: 3.3  . Smokeless tobacco: Never Used  Substance Use Topics  . Alcohol use: Yes    Comment: occasionally  . Drug use: No    Review of Systems Constitutional: No fever/chills Cardiovascular: Denies chest pain. Respiratory: Denies shortness of breath. Gastrointestinal: No abdominal pain.  No nausea, no vomiting.  No diarrhea.  Genitourinary: Positive for dysuria and hematuria. Musculoskeletal: Positive right shoulder pain with radiculopathy. Skin: Negative for rash.  ___________________________________________   PHYSICAL EXAM:  VITAL SIGNS: ED Triage Vitals  Enc Vitals Group     BP 03/19/19 1002 (!) 155/86     Pulse Rate 03/19/19 1002 68     Resp 03/19/19 1002 18     Temp 03/19/19 1002 98.2 F (36.8 C)     Temp Source 03/19/19 1002 Oral     SpO2 03/19/19 1002 98 %     Weight 03/19/19 1003 158 lb 11.7 oz (72 kg)     Height 03/19/19 1003 5\' 5"  (1.651 m)     Head Circumference --      Peak Flow --      Pain Score 03/19/19 1003 10     Pain Loc --      Pain Edu? --      Excl. in GC? --    Constitutional: Alert and oriented. Well appearing and in no acute distress. Eyes: Conjunctivae are normal.  Head: Atraumatic. Neck: No stridor.  No cervical tenderness on palpation posteriorly. Cardiovascular: Normal rate, regular rhythm. Grossly normal heart sounds.  Good peripheral circulation. Respiratory: Normal respiratory effort.  No retractions. Lungs CTAB. Gastrointestinal: Soft  and nontender. No distention.  No CVA tenderness. Musculoskeletal: Examination of the right shoulder there is no gross deformity and no crepitus is noted.  There is some tenderness on palpation of the right trapezius muscle without evidence of any injury.  No point tenderness is noted on palpation of the cervical spine and range of motion is minimally restricted secondary to discomfort.  In comparison with the left there is no changes.  Good muscle strength bilaterally with grip strength. Neurologic:  Normal speech and language. No gross focal neurologic deficits are appreciated. No gait instability. Skin:  Skin is warm, dry and intact.  No discoloration noted. Psychiatric: Mood and affect are normal. Speech and behavior are normal.  ____________________________________________   LABS (all labs ordered are listed, but only abnormal  results are displayed)  Labs Reviewed  URINALYSIS, ROUTINE W REFLEX MICROSCOPIC - Abnormal; Notable for the following components:      Result Value   Color, Urine YELLOW (*)    APPearance CLEAR (*)    Hgb urine dipstick MODERATE (*)    Protein, ur 30 (*)    Leukocytes,Ua MODERATE (*)    All other components within normal limits   EKG was reviewed by MD.  [place]  Official radiology report(s): DG Cervical Spine 2-3 Views  Result Date: 03/19/2019 CLINICAL DATA:  69 year old female with pain radiating to the right shoulder for 1 week. No known injury. EXAM: CERVICAL SPINE - 2-3 VIEW COMPARISON:  Portable chest radiograph 02/24/2019. FINDINGS: Normal prevertebral soft tissue contour and relatively preserved cervical lordosis. Cervicothoracic junction alignment is within normal limits. Relatively preserved disc spaces. Mild endplate spurring at H6-W7 and C5-C6. Normal C1-C2 alignment. Odontoid appears intact. No acute osseous abnormality identified. Negative visible upper chest aside from Calcified aortic atherosclerosis again noted in the arch. IMPRESSION: No  acute osseous abnormality identified and largely normal for age radiographic appearance of the cervical spine. Electronically Signed   By: Genevie Ann M.D.   On: 03/19/2019 12:31    ____________________________________________   PROCEDURES  Procedure(s) performed (including Critical Care):  Procedures   ____________________________________________   INITIAL IMPRESSION / ASSESSMENT AND PLAN / ED COURSE  As part of my medical decision making, I reviewed the following data within the electronic MEDICAL RECORD NUMBER Notes from prior ED visits and  Controlled Substance Database   69 year old female  presents to the ED with complaint of right shoulder pain x1 week with radicular type sensation down her arm.  Patient denies any injury.  Patient also reports hematuria yesterday and states that she has had urinary tract infections in the past.  Urinalysis was suspicious for UTI with 21-50 RBCs and 6-10 WBCs.  Cervical spine x-rays showed spurring on multiple levels.  Most likely this is because of patient's right cervical radiculopathy.  Above findings were discussed with patient.  A prescription for tramadol was sent to the pharmacy every 6 hours as needed for pain, prednisone 6-day course and Bactrim DS twice daily for 10 days.  Patient is encouraged to increase fluids.  She is also encouraged to use ice or heat to her shoulder as needed for discomfort and follow-up with her PCP if any continued problems.  A note for work for 2 days was written.  ____________________________________________   FINAL CLINICAL IMPRESSION(S) / ED DIAGNOSES  Final diagnoses:  Cervical radiculopathy  Acute urinary tract infection     ED Discharge Orders         Ordered    predniSONE (DELTASONE) 10 MG tablet     03/19/19 1356    traMADol (ULTRAM) 50 MG tablet  Every 6 hours PRN     03/19/19 1356    sulfamethoxazole-trimethoprim (BACTRIM DS) 800-160 MG tablet  2 times daily     03/19/19 1356           Note:   This document was prepared using Dragon voice recognition software and may include unintentional dictation errors.    Johnn Hai, PA-C 03/19/19 1436    Arta Silence, MD 03/19/19 1453

## 2019-03-19 NOTE — ED Notes (Signed)
Bladder Scan: 112 mL. Pt ambulatory to restroom to try and urinate at the moment. NAD noted, steady gait.

## 2019-03-19 NOTE — ED Notes (Signed)
Pt able to provide urine sample, given ice water.

## 2019-03-19 NOTE — Discharge Instructions (Signed)
Follow-up with your primary care provider if any continued problems.  Return to the emergency department if any severe worsening of your symptoms such as fever, chills, nausea or vomiting.  In addition to your degenerative changes in your neck causing your right arm to hurt you also have a urinary tract infection.  Be sure and take all of the Bactrim DS until completely gone twice a day for 10 days.  Increase fluids.  You may also take Tylenol with the pain medication if additional medicine is required.  You may use ice or heat to your neck as needed for discomfort.

## 2019-03-19 NOTE — ED Triage Notes (Signed)
Pt ambulatory to triage with reports that she has been having pain to rt shoulder x 1 week, pain with movement, also reports yesterday she noticed blood in her urine yesterday. Pt reports "I just don't feel good". Pt in NAD, no sob.

## 2019-03-28 ENCOUNTER — Encounter: Payer: Self-pay | Admitting: Emergency Medicine

## 2019-03-28 ENCOUNTER — Other Ambulatory Visit: Payer: Self-pay

## 2019-03-28 ENCOUNTER — Emergency Department
Admission: EM | Admit: 2019-03-28 | Discharge: 2019-03-28 | Disposition: A | Discharge status: Home / Self Care | Payer: No Typology Code available for payment source | Attending: Emergency Medicine | Admitting: Emergency Medicine

## 2019-03-28 ENCOUNTER — Emergency Department: Payer: No Typology Code available for payment source

## 2019-03-28 DIAGNOSIS — Z79899 Other long term (current) drug therapy: Secondary | ICD-10-CM | POA: Insufficient documentation

## 2019-03-28 DIAGNOSIS — M25561 Pain in right knee: Secondary | ICD-10-CM | POA: Diagnosis present

## 2019-03-28 DIAGNOSIS — M549 Dorsalgia, unspecified: Secondary | ICD-10-CM

## 2019-03-28 DIAGNOSIS — M791 Myalgia, unspecified site: Secondary | ICD-10-CM | POA: Diagnosis not present

## 2019-03-28 DIAGNOSIS — Z87891 Personal history of nicotine dependence: Secondary | ICD-10-CM | POA: Insufficient documentation

## 2019-03-28 DIAGNOSIS — I10 Essential (primary) hypertension: Secondary | ICD-10-CM | POA: Diagnosis not present

## 2019-03-28 MED ORDER — CYCLOBENZAPRINE HCL 5 MG PO TABS: 5.0000 mg | ORAL_TABLET | Freq: Three times a day (TID) | ORAL | 0 refills | Status: DC | PRN

## 2019-03-28 MED ORDER — TRAMADOL HCL 50 MG PO TABS: 50.0000 mg | ORAL_TABLET | Freq: Three times a day (TID) | ORAL | 0 refills | Status: DC | PRN

## 2019-03-28 MED ORDER — ONDANSETRON 4 MG PO TBDP: 4.0000 mg | ORAL_TABLET | Freq: Three times a day (TID) | ORAL | 0 refills | Status: DC | PRN
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
## 2019-03-28 NOTE — ED Provider Notes (Signed)
Procedures     ----------------------------------------- 5:23 PM on 03/28/2019 ----------------------------------------- EKG interpreted by me Sinus bradycardia rate of 57, normal axis and intervals.  Normal QRS ST segments and T waves.  No acute ischemic changes.     Sharman Cheek, MD 03/28/19 1723

## 2019-04-13 ENCOUNTER — Ambulatory Visit: Payer: Self-pay | Admitting: Physician Assistant

## 2019-07-17 ENCOUNTER — Encounter: Payer: Self-pay | Admitting: Physician Assistant

## 2019-07-17 ENCOUNTER — Other Ambulatory Visit: Payer: Self-pay

## 2019-07-17 ENCOUNTER — Ambulatory Visit (INDEPENDENT_AMBULATORY_CARE_PROVIDER_SITE_OTHER): Payer: Medicare Other | Admitting: Physician Assistant

## 2019-07-17 VITALS — BP 134/72 | HR 47 | Ht 65.0 in | Wt 179.0 lb

## 2019-07-17 DIAGNOSIS — Z8679 Personal history of other diseases of the circulatory system: Secondary | ICD-10-CM

## 2019-07-17 DIAGNOSIS — R001 Bradycardia, unspecified: Secondary | ICD-10-CM

## 2019-07-17 DIAGNOSIS — Z87891 Personal history of nicotine dependence: Secondary | ICD-10-CM

## 2019-07-17 DIAGNOSIS — Z9889 Other specified postprocedural states: Secondary | ICD-10-CM

## 2019-07-17 DIAGNOSIS — R7989 Other specified abnormal findings of blood chemistry: Secondary | ICD-10-CM

## 2019-07-17 DIAGNOSIS — I10 Essential (primary) hypertension: Secondary | ICD-10-CM | POA: Diagnosis not present

## 2019-07-17 DIAGNOSIS — R0989 Other specified symptoms and signs involving the circulatory and respiratory systems: Secondary | ICD-10-CM

## 2019-07-17 DIAGNOSIS — T783XXA Angioneurotic edema, initial encounter: Secondary | ICD-10-CM

## 2019-07-17 DIAGNOSIS — I2510 Atherosclerotic heart disease of native coronary artery without angina pectoris: Secondary | ICD-10-CM

## 2019-07-17 DIAGNOSIS — Z5309 Procedure and treatment not carried out because of other contraindication: Secondary | ICD-10-CM

## 2019-07-17 DIAGNOSIS — R011 Cardiac murmur, unspecified: Secondary | ICD-10-CM

## 2019-07-17 DIAGNOSIS — I483 Typical atrial flutter: Secondary | ICD-10-CM | POA: Diagnosis not present

## 2019-07-17 DIAGNOSIS — T464X5A Adverse effect of angiotensin-converting-enzyme inhibitors, initial encounter: Secondary | ICD-10-CM

## 2019-07-17 NOTE — Patient Instructions (Addendum)
Medication Instructions:  Your physician recommends that you continue on your current medications as directed. Please refer to the Current Medication list given to you today.  *If you need a refill on your cardiac medications before your next appointment, please call your pharmacy*   Lab Work: Your physician recommends that you have lab work today(BMET, tsh, cbc, hgb A1C)  If you have labs (blood work) drawn today and your tests are completely normal, you will receive your results only by: Marland Kitchen MyChart Message (if you have MyChart) OR . A paper copy in the mail If you have any lab test that is abnormal or we need to change your treatment, we will call you to review the results.   Testing/Procedures: 1- Echo  Please return to [facility] on ______________ at _______________ AM/PM for an Echocardiogram. Your physician has requested that you have an echocardiogram. Echocardiography is a painless test that uses sound waves to create images of your heart. It provides your doctor with information about the size and shape of your heart and how well your heart's chambers and valves are working. This procedure takes approximately one hour. There are no restrictions for this procedure. Please note; depending on visual quality an IV may need to be placed.   2- Your physician has requested that you have a carotid duplex. This test is an ultrasound of the carotid arteries in your neck. It looks at blood flow through these arteries that supply the brain with blood. Allow one hour for this exam. There are no restrictions or special instructions.    Follow-Up: At [facility], you and your health needs are our priority.  As part of our continuing mission to provide you with exceptional heart care, we have created designated Provider Care Teams.  These Care Teams include your primary Cardiologist (physician) and Advanced Practice Providers (APPs -  Physician Assistants and Nurse Practitioners)  who all work together to provide you with the care you need, when you need it.  We recommend signing up for the patient portal called "MyChart".  Sign up information is provided on this After Visit Summary.  MyChart is used to connect with patients for Virtual Visits (Telemedicine).  Patients are able to view lab/test results, encounter notes, upcoming appointments, etc.  Non-urgent messages can be sent to your provider as well.   To learn more about what you can do with MyChart, go to [email or web address].    Your next appointment:   4-6 weeks  The format for your next appointment:   In Person  Provider:     You may see Dr. Kirke Corin or Marisue Ivan, PA-C
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
## 2019-07-18 LAB — CBC
Hematocrit: 40.9 % (ref 34.0–46.6)
Hemoglobin: 12.5 g/dL (ref 11.1–15.9)
MCH: 23.6 pg — ABNORMAL LOW (ref 26.6–33.0)
MCHC: 30.6 g/dL — ABNORMAL LOW (ref 31.5–35.7)
MCV: 77 fL — ABNORMAL LOW (ref 79–97)
Platelets: 228 10*3/uL (ref 150–450)
RBC: 5.3 x10E6/uL — ABNORMAL HIGH (ref 3.77–5.28)
RDW: 14.2 % (ref 11.7–15.4)
WBC: 5.6 10*3/uL (ref 3.4–10.8)

## 2019-07-18 LAB — TSH: TSH: 3.87 u[IU]/mL (ref 0.450–4.500)

## 2019-07-18 LAB — BASIC METABOLIC PANEL
BUN/Creatinine Ratio: 19 (ref 12–28)
BUN: 13 mg/dL (ref 8–27)
CO2: 23 mmol/L (ref 20–29)
Calcium: 9.6 mg/dL (ref 8.7–10.3)
Chloride: 106 mmol/L (ref 96–106)
Creatinine, Ser: 0.69 mg/dL (ref 0.57–1.00)
GFR calc Af Amer: 105 mL/min/{1.73_m2} (ref >59)
GFR calc non Af Amer: 91 mL/min/{1.73_m2} (ref >59)
Glucose: 93 mg/dL (ref 65–99)
Potassium: 4.1 mmol/L (ref 3.5–5.2)
Sodium: 143 mmol/L (ref 134–144)

## 2019-07-18 LAB — HEMOGLOBIN A1C
Est. average glucose Bld gHb Est-mCnc: 111 mg/dL
Hgb A1c MFr Bld: 5.5 % (ref 4.8–5.6)

## 2019-07-23 ENCOUNTER — Encounter: Payer: Self-pay | Admitting: *Deleted

## 2019-07-25 ENCOUNTER — Other Ambulatory Visit: Payer: Self-pay

## 2019-07-25 ENCOUNTER — Ambulatory Visit: Payer: Medicare Other | Attending: Physician Assistant

## 2019-07-25 DIAGNOSIS — M545 Low back pain, unspecified: Secondary | ICD-10-CM

## 2019-07-25 DIAGNOSIS — M6281 Muscle weakness (generalized): Secondary | ICD-10-CM

## 2019-07-25 NOTE — Patient Instructions (Signed)
Access Code: [facility] URL: [email or web address] Date: 07/25/2019 Prepared by: Precious Bard  Exercises Hooklying Single Knee to Chest Stretch - 1 x daily - 7 x weekly - 2 sets - 2 reps - 20 hold Supine Sciatic Nerve Mobilization With Leg on Pillow - 1 x daily - 7 x weekly - 10 reps - 2 sets - 5 hold Supine Posterior Pelvic Tilt - 1 x daily - 7 x weekly - 10 reps - 2 sets - 5 hold Supine Lower Trunk Rotation - 1 x daily - 7 x weekly - 10 reps - 2 sets - 5 hold
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
## 2019-07-30 ENCOUNTER — Other Ambulatory Visit: Payer: Self-pay

## 2019-07-30 ENCOUNTER — Ambulatory Visit: Payer: Medicare Other

## 2019-07-30 DIAGNOSIS — M6281 Muscle weakness (generalized): Secondary | ICD-10-CM

## 2019-07-30 DIAGNOSIS — M545 Low back pain, unspecified: Secondary | ICD-10-CM
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
## 2019-08-01 ENCOUNTER — Other Ambulatory Visit: Payer: Self-pay

## 2019-08-01 ENCOUNTER — Ambulatory Visit: Payer: Medicare Other

## 2019-08-01 DIAGNOSIS — M545 Low back pain, unspecified: Secondary | ICD-10-CM

## 2019-08-01 DIAGNOSIS — M6281 Muscle weakness (generalized): Secondary | ICD-10-CM
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
## 2019-08-08 ENCOUNTER — Ambulatory Visit: Payer: Medicare Other

## 2019-08-08 DIAGNOSIS — M545 Low back pain, unspecified: Secondary | ICD-10-CM

## 2019-08-08 DIAGNOSIS — M6281 Muscle weakness (generalized): Secondary | ICD-10-CM
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
## 2019-08-10 ENCOUNTER — Ambulatory Visit: Payer: Medicare Other

## 2019-08-15 ENCOUNTER — Other Ambulatory Visit: Payer: Self-pay

## 2019-08-15 ENCOUNTER — Ambulatory Visit: Payer: Medicare Other | Attending: Physician Assistant

## 2019-08-15 DIAGNOSIS — M545 Low back pain, unspecified: Secondary | ICD-10-CM

## 2019-08-15 DIAGNOSIS — M6281 Muscle weakness (generalized): Secondary | ICD-10-CM | POA: Diagnosis present
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
## 2019-08-17 ENCOUNTER — Other Ambulatory Visit: Payer: Self-pay

## 2019-08-17 ENCOUNTER — Ambulatory Visit: Payer: Medicare Other

## 2019-08-17 DIAGNOSIS — M545 Low back pain, unspecified: Secondary | ICD-10-CM

## 2019-08-17 DIAGNOSIS — M6281 Muscle weakness (generalized): Secondary | ICD-10-CM
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
## 2019-08-21 ENCOUNTER — Emergency Department
Admission: EM | Admit: 2019-08-21 | Discharge: 2019-08-21 | Disposition: A | Discharge status: Home / Self Care | Payer: Medicare Other | Attending: Emergency Medicine | Admitting: Emergency Medicine

## 2019-08-21 ENCOUNTER — Ambulatory Visit: Payer: Medicare Other

## 2019-08-21 ENCOUNTER — Emergency Department: Payer: Medicare Other

## 2019-08-21 ENCOUNTER — Other Ambulatory Visit: Payer: Self-pay

## 2019-08-21 ENCOUNTER — Encounter: Payer: Self-pay | Admitting: Emergency Medicine

## 2019-08-21 DIAGNOSIS — T148XXA Other injury of unspecified body region, initial encounter: Secondary | ICD-10-CM

## 2019-08-21 DIAGNOSIS — I10 Essential (primary) hypertension: Secondary | ICD-10-CM | POA: Insufficient documentation

## 2019-08-21 DIAGNOSIS — R102 Pelvic and perineal pain: Secondary | ICD-10-CM | POA: Insufficient documentation

## 2019-08-21 DIAGNOSIS — M549 Dorsalgia, unspecified: Secondary | ICD-10-CM | POA: Diagnosis not present

## 2019-08-21 LAB — COMPREHENSIVE METABOLIC PANEL
ALT: 9 U/L (ref 0–44)
AST: 19 U/L (ref 15–41)
Albumin: 4.4 g/dL (ref 3.5–5.0)
Alkaline Phosphatase: 59 U/L (ref 38–126)
Anion gap: 7 (ref 5–15)
BUN: 14 mg/dL (ref 8–23)
CO2: 27 mmol/L (ref 22–32)
Calcium: 8.9 mg/dL (ref 8.9–10.3)
Chloride: 108 mmol/L (ref 98–111)
Creatinine, Ser: 0.77 mg/dL (ref 0.44–1.00)
GFR calc Af Amer: >60 mL/min (ref >60)
GFR calc non Af Amer: >60 mL/min (ref >60)
Glucose, Bld: 107 mg/dL — ABNORMAL HIGH (ref 70–99)
Potassium: 4.1 mmol/L (ref 3.5–5.1)
Sodium: 142 mmol/L (ref 135–145)
Total Bilirubin: 0.5 mg/dL (ref 0.3–1.2)
Total Protein: 7.6 g/dL (ref 6.5–8.1)

## 2019-08-21 LAB — CBC WITH DIFFERENTIAL/PLATELET
Abs Immature Granulocytes: 0.04 10*3/uL (ref 0.00–0.07)
Basophils Absolute: 0 10*3/uL (ref 0.0–0.1)
Basophils Relative: 0 %
Eosinophils Absolute: 0.1 10*3/uL (ref 0.0–0.5)
Eosinophils Relative: 1 %
HCT: 38.8 % (ref 36.0–46.0)
Hemoglobin: 12.3 g/dL (ref 12.0–15.0)
Immature Granulocytes: 1 %
Lymphocytes Relative: 14 %
Lymphs Abs: 1.1 10*3/uL (ref 0.7–4.0)
MCH: 23.7 pg — ABNORMAL LOW (ref 26.0–34.0)
MCHC: 31.7 g/dL (ref 30.0–36.0)
MCV: 74.6 fL — ABNORMAL LOW (ref 80.0–100.0)
Monocytes Absolute: 0.7 10*3/uL (ref 0.1–1.0)
Monocytes Relative: 9 %
Neutro Abs: 6.1 10*3/uL (ref 1.7–7.7)
Neutrophils Relative %: 75 %
Platelets: 255 10*3/uL (ref 150–400)
RBC: 5.2 MIL/uL — ABNORMAL HIGH (ref 3.87–5.11)
RDW: 15.7 % — ABNORMAL HIGH (ref 11.5–15.5)
WBC: 8.1 10*3/uL (ref 4.0–10.5)
nRBC: 0 % (ref 0.0–0.2)

## 2019-08-21 LAB — URINALYSIS, COMPLETE (UACMP) WITH MICROSCOPIC
Bilirubin Urine: NEGATIVE
Glucose, UA: NEGATIVE mg/dL
Hgb urine dipstick: NEGATIVE
Ketones, ur: NEGATIVE mg/dL
Nitrite: NEGATIVE
Protein, ur: NEGATIVE mg/dL
Specific Gravity, Urine: 1.036 — ABNORMAL HIGH (ref 1.005–1.030)
pH: 7 (ref 5.0–8.0)

## 2019-08-21 MED ORDER — DIAZEPAM 5 MG PO TABS
5.0000 mg | ORAL_TABLET | Freq: Once | ORAL | Status: AC
  Administered 2019-08-21: 5 mg via ORAL
  Filled 2019-08-21: qty 1

## 2019-08-21 MED ORDER — DIAZEPAM 5 MG PO TABS: 5.0000 mg | ORAL_TABLET | Freq: Three times a day (TID) | ORAL | 0 refills | Status: DC | PRN

## 2019-08-21 MED ORDER — SULFAMETHOXAZOLE-TRIMETHOPRIM 800-160 MG PO TABS
1.0000 | ORAL_TABLET | Freq: Two times a day (BID) | ORAL | 0 refills | Status: DC
Start: 2019-08-21 — End: 2020-03-01

## 2019-08-21 MED ORDER — IOHEXOL 300 MG/ML  SOLN
100.0000 mL | Freq: Once | INTRAMUSCULAR | Status: AC | PRN
  Administered 2019-08-21: 100 mL via INTRAVENOUS

## 2019-08-21 NOTE — ED Notes (Signed)
Pt ambulatory to bathroom with standby assist.
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
## 2019-08-21 NOTE — ED Triage Notes (Signed)
Pain to lower abdomen/pelvis worse when moving.  Wraps around to lower back/buttocks/thighs.  No NVD. No fever.

## 2019-08-23 ENCOUNTER — Ambulatory Visit (INDEPENDENT_AMBULATORY_CARE_PROVIDER_SITE_OTHER): Payer: Medicare Other

## 2019-08-23 ENCOUNTER — Other Ambulatory Visit: Payer: Self-pay

## 2019-08-23 DIAGNOSIS — R011 Cardiac murmur, unspecified: Secondary | ICD-10-CM

## 2019-08-23 DIAGNOSIS — I5189 Other ill-defined heart diseases: Secondary | ICD-10-CM

## 2019-08-23 DIAGNOSIS — R0989 Other specified symptoms and signs involving the circulatory and respiratory systems: Secondary | ICD-10-CM

## 2019-08-23 HISTORY — DX: Other ill-defined heart diseases: I51.89

## 2019-08-24 ENCOUNTER — Ambulatory Visit: Payer: Medicare Other
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
## 2019-08-28 ENCOUNTER — Other Ambulatory Visit: Payer: Self-pay

## 2019-08-28 ENCOUNTER — Ambulatory Visit: Payer: Medicare Other | Admitting: Physical Therapy

## 2019-08-28 ENCOUNTER — Encounter: Payer: Self-pay | Admitting: Physical Therapy

## 2019-08-28 DIAGNOSIS — M6281 Muscle weakness (generalized): Secondary | ICD-10-CM

## 2019-08-28 DIAGNOSIS — M545 Low back pain, unspecified: Secondary | ICD-10-CM
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
## 2019-08-30 ENCOUNTER — Ambulatory Visit: Payer: Medicare Other

## 2019-08-30 ENCOUNTER — Other Ambulatory Visit: Payer: Self-pay

## 2019-08-30 DIAGNOSIS — M545 Low back pain, unspecified: Secondary | ICD-10-CM

## 2019-08-30 DIAGNOSIS — M6281 Muscle weakness (generalized): Secondary | ICD-10-CM
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
## 2019-09-03 ENCOUNTER — Ambulatory Visit: Payer: Medicare Other | Admitting: Physician Assistant

## 2019-09-04 ENCOUNTER — Ambulatory Visit: Payer: Medicare Other

## 2019-09-05 ENCOUNTER — Ambulatory Visit (INDEPENDENT_AMBULATORY_CARE_PROVIDER_SITE_OTHER): Payer: Medicare Other | Admitting: Physician Assistant

## 2019-09-05 ENCOUNTER — Encounter: Payer: Self-pay | Admitting: Physician Assistant

## 2019-09-05 ENCOUNTER — Other Ambulatory Visit: Payer: Self-pay

## 2019-09-05 VITALS — BP 140/70 | HR 46 | Ht 65.0 in | Wt 181.1 lb

## 2019-09-05 DIAGNOSIS — T783XXA Angioneurotic edema, initial encounter: Secondary | ICD-10-CM

## 2019-09-05 DIAGNOSIS — I517 Cardiomegaly: Secondary | ICD-10-CM

## 2019-09-05 DIAGNOSIS — E038 Other specified hypothyroidism: Secondary | ICD-10-CM

## 2019-09-05 DIAGNOSIS — Z87891 Personal history of nicotine dependence: Secondary | ICD-10-CM

## 2019-09-05 DIAGNOSIS — I10 Essential (primary) hypertension: Secondary | ICD-10-CM | POA: Diagnosis not present

## 2019-09-05 DIAGNOSIS — Z9889 Other specified postprocedural states: Secondary | ICD-10-CM

## 2019-09-05 DIAGNOSIS — I483 Typical atrial flutter: Secondary | ICD-10-CM

## 2019-09-05 DIAGNOSIS — I447 Left bundle-branch block, unspecified: Secondary | ICD-10-CM

## 2019-09-05 DIAGNOSIS — T464X5A Adverse effect of angiotensin-converting-enzyme inhibitors, initial encounter: Secondary | ICD-10-CM

## 2019-09-05 DIAGNOSIS — Z8679 Personal history of other diseases of the circulatory system: Secondary | ICD-10-CM

## 2019-09-05 MED ORDER — HYDRALAZINE HCL 50 MG PO TABS: 50.0000 mg | ORAL_TABLET | Freq: Two times a day (BID) | ORAL | 3 refills | Status: DC

## 2019-09-05 MED ORDER — HYDROCHLOROTHIAZIDE 25 MG PO TABS
25.0000 mg | ORAL_TABLET | Freq: Every day | ORAL | 3 refills | Status: DC
Start: 2019-09-05 — End: 2020-01-09

## 2019-09-05 NOTE — Patient Instructions (Signed)
Medication Instructions:   Your physician has recommended you make the following change in your medication:   1.  INCREASE your Hydralazine (Apresoline): Take 1 tablet (50 mg total) by mouth in the morning and at bedtime  2.  CALL us if your BP is elevated over 130/80.  3.  Check you weight daily and notify us if your up 2 lbs overnight, or 5 lbs over a weeks time.  *If you need a refill on your cardiac medications before your next appointment, please call your pharmacy*   Lab Work: None Ordered. If you have labs (blood work) drawn today and your tests are completely normal, you will receive your results only by: Marland Kitchen MyChart Message (if you have MyChart) OR . A paper copy in the mail If you have any lab test that is abnormal or we need to change your treatment, we will call you to review the results.   Testing/Procedures: None Ordered.    Follow-Up: At [facility], you and your health needs are our priority.  As part of our continuing mission to provide you with exceptional heart care, we have created designated Provider Care Teams.  These Care Teams include your primary Cardiologist (physician) and Advanced Practice Providers (APPs -  Physician Assistants and Nurse Practitioners) who all work together to provide you with the care you need, when you need it.  We recommend signing up for the patient portal called "MyChart".  Sign up information is provided on this After Visit Summary.  MyChart is used to connect with patients for Virtual Visits (Telemedicine).  Patients are able to view lab/test results, encounter notes, upcoming appointments, etc.  Non-urgent messages can be sent to your provider as well.   To learn more about what you can do with MyChart, go to [email or web address].    Your next appointment:   3 month(s)  The format for your next appointment:   In Person  Provider:    You may see Lorine Bears, MD or one of the following Advanced Practice Providers on  your designated Care Team:    Nicolasa Ducking, NP  Eula Listen, PA-C  Marisue Ivan, PA-C  Gillian Shields, NP    Other Instructions N/A
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
## 2019-09-05 NOTE — Progress Notes (Deleted)
Elevated BP in the setting of lost dog  Over a year quit smoking, no more pounding fluttering PT  Hot flashes and trouble breathing - uses inhalers and it helps Uses inhaler 2-3 days Sometimes needs inhaler when laying flat swelling

## 2019-09-06 ENCOUNTER — Ambulatory Visit: Payer: Medicare Other

## 2019-09-06 DIAGNOSIS — M545 Low back pain, unspecified: Secondary | ICD-10-CM

## 2019-09-06 DIAGNOSIS — M6281 Muscle weakness (generalized): Secondary | ICD-10-CM
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
## 2019-09-10 ENCOUNTER — Other Ambulatory Visit: Payer: Self-pay | Admitting: Physician Assistant

## 2019-09-10 DIAGNOSIS — Z1231 Encounter for screening mammogram for malignant neoplasm of breast: Secondary | ICD-10-CM

## 2019-09-11 ENCOUNTER — Other Ambulatory Visit: Payer: Self-pay

## 2019-09-11 ENCOUNTER — Ambulatory Visit: Payer: Medicare Other

## 2019-09-11 DIAGNOSIS — M545 Low back pain, unspecified: Secondary | ICD-10-CM

## 2019-09-11 DIAGNOSIS — M6281 Muscle weakness (generalized): Secondary | ICD-10-CM
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
## 2019-09-13 ENCOUNTER — Ambulatory Visit: Payer: Medicare Other

## 2019-09-20 ENCOUNTER — Ambulatory Visit: Payer: Medicare Other

## 2019-09-25 ENCOUNTER — Other Ambulatory Visit: Payer: Self-pay

## 2019-09-25 ENCOUNTER — Ambulatory Visit: Payer: Medicare Other | Attending: Physician Assistant

## 2019-09-25 DIAGNOSIS — M6281 Muscle weakness (generalized): Secondary | ICD-10-CM | POA: Diagnosis present

## 2019-09-25 DIAGNOSIS — M545 Low back pain, unspecified: Secondary | ICD-10-CM
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
## 2019-09-26 ENCOUNTER — Ambulatory Visit
Admission: RE | Admit: 2019-09-26 | Discharge: 2019-09-26 | Disposition: A | Discharge status: Home / Self Care | Payer: Medicare Other | Source: Ambulatory Visit | Attending: Physician Assistant | Admitting: Physician Assistant

## 2019-09-26 DIAGNOSIS — Z1231 Encounter for screening mammogram for malignant neoplasm of breast: Secondary | ICD-10-CM

## 2019-09-27 ENCOUNTER — Encounter: Payer: Medicare Other | Admitting: Physical Therapy

## 2019-10-03 ENCOUNTER — Other Ambulatory Visit: Payer: Self-pay | Admitting: Physician Assistant

## 2019-10-03 DIAGNOSIS — N6310 Unspecified lump in the right breast, unspecified quadrant: Secondary | ICD-10-CM

## 2019-10-03 DIAGNOSIS — R928 Other abnormal and inconclusive findings on diagnostic imaging of breast: Secondary | ICD-10-CM

## 2019-12-06 ENCOUNTER — Ambulatory Visit: Payer: Medicare Other | Admitting: Physician Assistant

## 2019-12-10 ENCOUNTER — Other Ambulatory Visit: Payer: Self-pay

## 2019-12-10 MED ORDER — HYDRALAZINE HCL 50 MG PO TABS: 50.0000 mg | ORAL_TABLET | Freq: Two times a day (BID) | ORAL | 3 refills | Status: DC

## 2019-12-27 ENCOUNTER — Ambulatory Visit: Payer: Medicare Other | Admitting: Physician Assistant

## 2020-01-09 ENCOUNTER — Other Ambulatory Visit: Payer: Self-pay

## 2020-01-09 ENCOUNTER — Encounter: Payer: Self-pay | Admitting: Physician Assistant

## 2020-01-09 ENCOUNTER — Ambulatory Visit (INDEPENDENT_AMBULATORY_CARE_PROVIDER_SITE_OTHER): Payer: Medicare Other | Admitting: Physician Assistant

## 2020-01-09 VITALS — BP 120/60 | HR 57 | Ht 65.0 in | Wt 177.0 lb

## 2020-01-09 DIAGNOSIS — I10 Essential (primary) hypertension: Secondary | ICD-10-CM

## 2020-01-09 DIAGNOSIS — T464X5A Adverse effect of angiotensin-converting-enzyme inhibitors, initial encounter: Secondary | ICD-10-CM

## 2020-01-09 DIAGNOSIS — Z87891 Personal history of nicotine dependence: Secondary | ICD-10-CM

## 2020-01-09 DIAGNOSIS — I5032 Chronic diastolic (congestive) heart failure: Secondary | ICD-10-CM

## 2020-01-09 DIAGNOSIS — I340 Nonrheumatic mitral (valve) insufficiency: Secondary | ICD-10-CM

## 2020-01-09 DIAGNOSIS — R001 Bradycardia, unspecified: Secondary | ICD-10-CM

## 2020-01-09 DIAGNOSIS — E039 Hypothyroidism, unspecified: Secondary | ICD-10-CM

## 2020-01-09 DIAGNOSIS — Z8679 Personal history of other diseases of the circulatory system: Secondary | ICD-10-CM | POA: Diagnosis not present

## 2020-01-09 DIAGNOSIS — Z9889 Other specified postprocedural states: Secondary | ICD-10-CM

## 2020-01-09 DIAGNOSIS — T783XXA Angioneurotic edema, initial encounter: Secondary | ICD-10-CM

## 2020-01-09 MED ORDER — HYDROCHLOROTHIAZIDE 25 MG PO TABS: 25.0000 mg | ORAL_TABLET | Freq: Every day | ORAL | 3 refills | Status: DC

## 2020-01-09 NOTE — Patient Instructions (Signed)
Medication Instructions:  Your physician recommends that you continue on your current medications as directed. Please refer to the Current Medication list given to you today.  *If you need a refill on your cardiac medications before your next appointment, please call your pharmacy*   Lab Work: None Ordered If you have labs (blood work) drawn today and your tests are completely normal, you will receive your results only by: Marland Kitchen MyChart Message (if you have MyChart) OR . A paper copy in the mail If you have any lab test that is abnormal or we need to change your treatment, we will call you to review the results.   Testing/Procedures: None Ordered   Follow-Up: At [facility], you and your health needs are our priority.  As part of our continuing mission to provide you with exceptional heart care, we have created designated Provider Care Teams.  These Care Teams include your primary Cardiologist (physician) and Advanced Practice Providers (APPs -  Physician Assistants and Nurse Practitioners) who all work together to provide you with the care you need, when you need it.  We recommend signing up for the patient portal called "MyChart".  Sign up information is provided on this After Visit Summary.  MyChart is used to connect with patients for Virtual Visits (Telemedicine).  Patients are able to view lab/test results, encounter notes, upcoming appointments, etc.  Non-urgent messages can be sent to your provider as well.   To learn more about what you can do with MyChart, go to [email or web address].    Your next appointment:   6-12 month(s)  The format for your next appointment:   In Person  Provider:   You may see Lorine Bears, MD or one of the following Advanced Practice Providers on your designated Care Team:    Nicolasa Ducking, NP  Eula Listen, PA-C  Marisue Ivan, PA-C  Cadence Fransico Michael, [place]    Other Instructions
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
## 2020-01-15 ENCOUNTER — Other Ambulatory Visit: Payer: Self-pay | Admitting: Physician Assistant

## 2020-03-01 ENCOUNTER — Other Ambulatory Visit: Payer: Self-pay

## 2020-03-01 ENCOUNTER — Emergency Department
Admission: EM | Admit: 2020-03-01 | Discharge: 2020-03-01 | Disposition: A | Discharge status: Home / Self Care | Payer: Medicare Other | Attending: Emergency Medicine | Admitting: Emergency Medicine

## 2020-03-01 DIAGNOSIS — Z79899 Other long term (current) drug therapy: Secondary | ICD-10-CM | POA: Insufficient documentation

## 2020-03-01 DIAGNOSIS — Z87891 Personal history of nicotine dependence: Secondary | ICD-10-CM | POA: Diagnosis not present

## 2020-03-01 DIAGNOSIS — I10 Essential (primary) hypertension: Secondary | ICD-10-CM | POA: Diagnosis not present

## 2020-03-01 DIAGNOSIS — E039 Hypothyroidism, unspecified: Secondary | ICD-10-CM | POA: Insufficient documentation

## 2020-03-01 DIAGNOSIS — H6993 Unspecified Eustachian tube disorder, bilateral: Secondary | ICD-10-CM | POA: Diagnosis not present

## 2020-03-01 DIAGNOSIS — H9203 Otalgia, bilateral: Secondary | ICD-10-CM | POA: Diagnosis present

## 2020-03-01 DIAGNOSIS — H6983 Other specified disorders of Eustachian tube, bilateral: Secondary | ICD-10-CM

## 2020-03-01 MED ORDER — PREDNISONE 10 MG PO TABS
ORAL_TABLET | ORAL | 0 refills | Status: DC
Start: 2020-03-01 — End: 2021-01-24

## 2020-03-01 MED ORDER — PREDNISONE 10 MG PO TABS
ORAL_TABLET | ORAL | 0 refills | Status: DC
Start: 2020-03-01 — End: 2020-03-01

## 2020-03-01 NOTE — ED Provider Notes (Signed)
[facility] Emergency Department Provider Note  ____________________________________________   Event Date/Time   First MD Initiated Contact with Patient 03/01/20 [phone]     (approximate)  I have reviewed the triage vital signs and the nursing notes.   HISTORY  Chief Complaint Ear Pain   HPI [name] is a 69 y.o. female presents to the ED with complaint of bilateral ear pain and decreased hearing.  Patient states that she generally has this 1 time a year and was given steroids which cleared it completely.  Patient denies any fever, chills, nausea or vomiting.  She denies any dizziness or any previous history of tinnitus.        Past Medical History:  Diagnosis Date  . Atrial flutter (HCC) 09/2015   a. CHADS2VASc 2 (HTN, female); b. on Eliquis; c. echo 7/17: EF 50-55%, no RWMA, rhythm atrial flutter, mildly dilated LA/RV, moderately to severely dilated RA, trivial pericardial effusion  . Essential hypertension   . GERD (gastroesophageal reflux disease)   . Subclinical hypothyroidism   . Tobacco abuse     Patient Active Problem List   Diagnosis Date Noted  . Atrial flutter (HCC) 01/16/2016  . SOB (shortness of breath)   . Subclinical hypothyroidism   . Typical atrial flutter (HCC) 09/24/2015  . Essential hypertension 09/23/2015  . Tobacco abuse 09/23/2015    Past Surgical History:  Procedure Laterality Date  . ABDOMINAL HYSTERECTOMY    . ABLATION OF DYSRHYTHMIC FOCUS  01/16/2016  . ELECTROPHYSIOLOGIC STUDY N/A 11/11/2015   Procedure: CARDIOVERSION;  Surgeon: Antonieta Iba, MD;  Location: [facility] ORS;  Service: Cardiovascular;  Laterality: N/A;  . ELECTROPHYSIOLOGIC STUDY N/A 01/16/2016   Procedure: A-Flutter Ablation;  Surgeon: Duke Salvia, MD;  Location: [facility] INVASIVE CV LAB;  Service: Cardiovascular;  Laterality: N/A;    Prior to Admission medications   Medication Sig Start Date End Date Taking? Authorizing Provider  acetaminophen  (TYLENOL) 500 MG tablet Take 500 mg by mouth every 6 (six) hours as needed for moderate pain or headache.    [provider]  albuterol (VENTOLIN HFA) 108 (90 Base) MCG/ACT inhaler Inhale 2-4 puffs by mouth every 4 hours as needed for wheezing, cough, and/or shortness of breath 02/24/19   Loleta Rose, MD  amLODipine (NORVASC) 10 MG tablet Take 1 tablet (10 mg total) by mouth daily. 03/24/18 09/04/28  Cuthriell, Delorise Royals, PA-C  ascorbic acid (VITAMIN C) 500 MG tablet Take 500 mg by mouth daily.    [provider]  cholecalciferol (VITAMIN D) 1000 units tablet Take 1,000 Units by mouth daily.    [provider]  diazepam (VALIUM) 5 MG tablet Take 1 tablet (5 mg total) by mouth every 8 (eight) hours as needed for muscle spasms. 08/21/19   Emily Filbert, MD  hydrALAZINE (APRESOLINE) 50 MG tablet Take 1 tablet (50 mg total) by mouth in the morning and at bedtime. 12/10/19 03/09/20  Marisue Ivan D, PA-C  hydrochlorothiazide (HYDRODIURIL) 25 MG tablet Take 1 tablet (25 mg total) by mouth daily. 01/09/20 04/08/20  Marisue Ivan D, PA-C  levothyroxine (SYNTHROID, LEVOTHROID) 88 MCG tablet Take 88 mcg by mouth daily before breakfast.  10/24/15 09/04/28  [provider]  predniSONE (DELTASONE) 10 MG tablet Take 3 tablets once a day for 4 days 03/01/20   Tommi Rumps, PA-C  vitamin B-12 (CYANOCOBALAMIN) 500 MCG tablet Take 500 mcg by mouth daily.    [provider]    Allergies Lisinopril and  Amoxicillin  Family History  Problem Relation Age of Onset  . Hypertension Mother   . Diabetes Mother   . Diabetes Father   . Diabetes Brother   . Hypertension Brother   . Hypertension Brother   . Heart attack Brother   . Diabetes Brother   . Breast cancer Neg Hx     Social History Social History   Tobacco Use  . Smoking status: Former Smoker    Quit date: 11/19/2015    Years since quitting: 4.2  . Smokeless tobacco: Never Used  Vaping Use  .  Vaping Use: Never used  Substance Use Topics  . Alcohol use: Yes    Comment: occasionally  . Drug use: No    Review of Systems Constitutional: No fever/chills Eyes: No visual changes. ENT: No sore throat.  Positive bilateral ear pain and tinnitus. Cardiovascular: Denies chest pain. Respiratory: Denies shortness of breath. Gastrointestinal: No abdominal pain.  No nausea, no vomiting. Musculoskeletal: Negative for musculoskeletal pain. Skin: Negative for rash. Neurological: Negative for headaches, focal weakness or numbness. ____________________________________________   PHYSICAL EXAM:  VITAL SIGNS: ED Triage Vitals  Enc Vitals Group     BP 03/01/20 0658 (!) 179/78     Pulse Rate 03/01/20 0658 64     Resp 03/01/20 0658 18     Temp 03/01/20 0658 98.2 F (36.8 C)     Temp Source 03/01/20 0658 Oral     SpO2 03/01/20 0658 99 %     Weight 03/01/20 0655 170 lb (77.1 kg)     Height 03/01/20 0655 5\' 5"  (1.651 m)     Head Circumference --      Peak Flow --      Pain Score 03/01/20 0654 0     Pain Loc --      Pain Edu? --      Excl. in GC? --    Constitutional: Alert and oriented. Well appearing and in no acute distress. Eyes: Conjunctivae are normal. PERRL. EOMI. Head: Atraumatic. Nose: No congestion/rhinnorhea. Ears: TMs are dull with poor light reflex but no erythema or injection is noted. Neck: No stridor.   Cardiovascular: Normal rate, regular rhythm. Grossly normal heart sounds.  Good peripheral circulation. Respiratory: Normal respiratory effort.  No retractions. Lungs CTAB. Musculoskeletal: Moves upper and lower extremities with any difficulty.  Normal gait was noted. Neurologic:  Normal speech and language. No gross focal neurologic deficits are appreciated. No gait instability. Skin:  Skin is warm, dry and intact.  Psychiatric: Mood and affect are normal. Speech and behavior are normal.  ____________________________________________   LABS (all labs ordered are  listed, but only abnormal results are displayed)  Labs Reviewed - No data to display ____________________________________________   PROCEDURES  Procedure(s) performed (including Critical Care):  Procedures ____________________________________________   INITIAL IMPRESSION / ASSESSMENT AND PLAN / ED COURSE  As part of my medical decision making, I reviewed the following data within the electronic MEDICAL RECORD NUMBER Notes from prior ED visits and [place] Controlled Substance Database  69 year old female presents to the ED with onset of bilateral ear pain and tinnitus along with a sensation of decreased hearing.  Patient has history of eustachian tube dysfunction and was treated in 2020 with prednisone which she states cleared it.  Patient also had Flonase to use at that time which helped.  She states that she still has some and can buy this over-the-counter.  A prescription for prednisone 30 mg once a day for the next 4 days  was written.  Patient is encouraged to continue using the Flonase nasal spray and if any continued problems she is to follow-up with Dr. Willeen Cass who is on-call for [facility].  ____________________________________________   FINAL CLINICAL IMPRESSION(S) / ED DIAGNOSES  Final diagnoses:  Eustachian tube dysfunction, bilateral     ED Discharge Orders         Ordered    predniSONE (DELTASONE) 10 MG tablet  Status:  Discontinued        03/01/20 0807    predniSONE (DELTASONE) 10 MG tablet        03/01/20 2836          *Please note:  [name] was evaluated in Emergency Department on 03/01/2020 for the symptoms described in the history of present illness. She was evaluated in the context of the global COVID-19 pandemic, which necessitated consideration that the patient might be at risk for infection with the SARS-CoV-2 virus that causes COVID-19. Institutional protocols and algorithms that pertain to the evaluation of patients at risk for COVID-19 are in a state  of rapid change based on information released by regulatory bodies including the CDC and federal and state organizations. These policies and algorithms were followed during the patient's care in the ED.  Some ED evaluations and interventions may be delayed as a result of limited staffing during and the pandemic.*   Note:  This document was prepared using Dragon voice recognition software and may include unintentional dictation errors.   Tommi Rumps, PA-C 03/01/20 6294    [place]ton [place], MD 03/01/20 [phone]

## 2020-03-01 NOTE — ED Triage Notes (Signed)
Pt complains of tinnitus in bilateral ears. Pt states no pain, no drainage. Pt states she has this annually. Pt states "it's driving me crazy". Pt denies headache, dizziness.

## 2020-03-01 NOTE — ED Notes (Signed)
Pt d/c form printed, signed and sent to HIM to be scanned into the chart

## 2020-03-01 NOTE — ED Notes (Signed)
Pt verbalizes understanding of d/c instructions, medications and follow up 

## 2020-03-01 NOTE — Discharge Instructions (Addendum)
Follow up with Dr. Willeen Cass if any continued problems with your ears.  Continue using Flonase nasal spray.  A prescription for prednisone was sent to your pharmacy.  This is 3 tablets once a day for the next 4 days.  You may also use a decongestant such as Sudafed PE which is over-the-counter also.  You may use ibuprofen if needed for pain.  If the ringing in your ears continues she will definitely need to see Dr. Willeen Cass at [facility].  Follow-up with your primary care provider to have your blood pressure rechecked as it was elevated at 179/78.

## 2020-05-13 ENCOUNTER — Ambulatory Visit
Admission: RE | Admit: 2020-05-13 | Discharge: 2020-05-13 | Disposition: A | Discharge status: Home / Self Care | Payer: Medicare Other | Source: Ambulatory Visit | Attending: Physician Assistant | Admitting: Physician Assistant

## 2020-05-13 ENCOUNTER — Other Ambulatory Visit: Payer: Self-pay

## 2020-05-13 DIAGNOSIS — R928 Other abnormal and inconclusive findings on diagnostic imaging of breast: Secondary | ICD-10-CM | POA: Diagnosis not present

## 2020-05-13 DIAGNOSIS — N6310 Unspecified lump in the right breast, unspecified quadrant: Secondary | ICD-10-CM

## 2020-07-14 ENCOUNTER — Other Ambulatory Visit: Payer: Self-pay

## 2020-07-14 ENCOUNTER — Emergency Department
Admission: EM | Admit: 2020-07-14 | Discharge: 2020-07-14 | Disposition: A | Discharge status: Home / Self Care | Payer: Medicare Other | Attending: Emergency Medicine | Admitting: Emergency Medicine

## 2020-07-14 DIAGNOSIS — E039 Hypothyroidism, unspecified: Secondary | ICD-10-CM | POA: Diagnosis not present

## 2020-07-14 DIAGNOSIS — Z79899 Other long term (current) drug therapy: Secondary | ICD-10-CM | POA: Diagnosis not present

## 2020-07-14 DIAGNOSIS — M5412 Radiculopathy, cervical region: Secondary | ICD-10-CM

## 2020-07-14 DIAGNOSIS — M25512 Pain in left shoulder: Secondary | ICD-10-CM | POA: Diagnosis present

## 2020-07-14 DIAGNOSIS — Z87891 Personal history of nicotine dependence: Secondary | ICD-10-CM | POA: Insufficient documentation

## 2020-07-14 DIAGNOSIS — I10 Essential (primary) hypertension: Secondary | ICD-10-CM | POA: Diagnosis not present

## 2020-07-14 MED ORDER — IBUPROFEN 800 MG PO TABS
800.0000 mg | ORAL_TABLET | Freq: Three times a day (TID) | ORAL | 0 refills | Status: DC | PRN
Start: 2020-07-14 — End: 2021-06-26

## 2020-07-14 MED ORDER — CYCLOBENZAPRINE HCL 10 MG PO TABS
5.0000 mg | ORAL_TABLET | Freq: Once | ORAL | Status: AC
  Administered 2020-07-14: 5 mg via ORAL
  Filled 2020-07-14: qty 1

## 2020-07-14 MED ORDER — PREDNISONE 20 MG PO TABS
30.0000 mg | ORAL_TABLET | Freq: Once | ORAL | Status: AC
  Administered 2020-07-14: 30 mg via ORAL
  Filled 2020-07-14: qty 1

## 2020-07-14 MED ORDER — HYDROCODONE-ACETAMINOPHEN 5-325 MG PO TABS: 1.0000 | ORAL_TABLET | Freq: Four times a day (QID) | ORAL | 0 refills | Status: DC | PRN

## 2020-07-14 MED ORDER — CYCLOBENZAPRINE HCL 5 MG PO TABS: ORAL_TABLET | ORAL | 0 refills | Status: DC

## 2020-07-14 MED ORDER — KETOROLAC TROMETHAMINE 60 MG/2ML IM SOLN
30.0000 mg | Freq: Once | INTRAMUSCULAR | Status: AC
  Administered 2020-07-14: 30 mg via INTRAMUSCULAR
  Filled 2020-07-14: qty 2

## 2020-07-14 MED ORDER — METHYLPREDNISOLONE 4 MG PO TBPK
ORAL_TABLET | ORAL | 0 refills | Status: DC
Start: 2020-07-14 — End: 2021-06-26

## 2020-07-14 MED ORDER — HYDROCODONE-ACETAMINOPHEN 5-325 MG PO TABS
1.0000 | ORAL_TABLET | Freq: Once | ORAL | Status: AC
  Administered 2020-07-14: 1 via ORAL
  Filled 2020-07-14: qty 1

## 2020-07-14 NOTE — ED Notes (Signed)
Spoke with dr. Dolores Frame regarding pt's presenting symptoms. Order for ekg received.

## 2020-07-14 NOTE — ED Triage Notes (Signed)
Pt states left shoulder pain since early last week. Pt states she now has pain radiating down left arm to hand. Pt denies known injury, states she is not able to lift arm above shoulder. Pt denies chest pain, shob, fever, dizziness.

## 2020-07-14 NOTE — Discharge Instructions (Signed)
1.  Take steroid taper as prescribed (Medrol Dosepak). 2.  You may take medicines as needed for pain and muscle spasms (Motrin/Norco/Flexeril #15). 3.  Wear sling as needed for comfort. 4.  Return to the ER for worsening symptoms, persistent vomiting, difficulty breathing or other concerns.

## 2020-07-14 NOTE — ED Provider Notes (Signed)
[facility] Emergency Department Provider Note   ____________________________________________   Event Date/Time   First MD Initiated Contact with Patient 07/14/20 0103     (approximate)  I have reviewed the triage vital signs and the nursing notes.   HISTORY  Chief Complaint Shoulder Pain    HPI [name] is a 70 y.o. female who presents to the ED from home with a chief complaint of nontraumatic left shoulder pain x1.5 weeks, worsening since awakening yesterday.  Reports pain from her left shoulder radiating down to her hand.  Denies extremity weakness/numbness/tingling.  Pain exacerbated by movement.  Thinks she slept on it wrong.  Denies trauma/fall/injury/heavy lifting.  Patient is right-hand dominant.  Denies fever, cough, chest pain, shortness of breath, abdominal pain, nausea, vomiting, diaphoresis or dizziness.  No longer takes Eliquis status post ablation several years ago.     Past Medical History:  Diagnosis Date  . Atrial flutter (HCC) 09/2015   a. CHADS2VASc 2 (HTN, female); b. on Eliquis; c. echo 7/17: EF 50-55%, no RWMA, rhythm atrial flutter, mildly dilated LA/RV, moderately to severely dilated RA, trivial pericardial effusion  . Essential hypertension   . GERD (gastroesophageal reflux disease)   . Subclinical hypothyroidism   . Tobacco abuse     Patient Active Problem List   Diagnosis Date Noted  . Atrial flutter (HCC) 01/16/2016  . SOB (shortness of breath)   . Subclinical hypothyroidism   . Typical atrial flutter (HCC) 09/24/2015  . Essential hypertension 09/23/2015  . Tobacco abuse 09/23/2015    Past Surgical History:  Procedure Laterality Date  . ABDOMINAL HYSTERECTOMY    . ABLATION OF DYSRHYTHMIC FOCUS  01/16/2016  . ELECTROPHYSIOLOGIC STUDY N/A 11/11/2015   Procedure: CARDIOVERSION;  Surgeon: Antonieta Iba, MD;  Location: [facility] ORS;  Service: Cardiovascular;  Laterality: N/A;  . ELECTROPHYSIOLOGIC STUDY N/A  01/16/2016   Procedure: A-Flutter Ablation;  Surgeon: Duke Salvia, MD;  Location: [facility] INVASIVE CV LAB;  Service: Cardiovascular;  Laterality: N/A;    Prior to Admission medications   Medication Sig Start Date End Date Taking? Authorizing Provider  acetaminophen (TYLENOL) 500 MG tablet Take 500 mg by mouth every 6 (six) hours as needed for moderate pain or headache.    [provider]  albuterol (VENTOLIN HFA) 108 (90 Base) MCG/ACT inhaler Inhale 2-4 puffs by mouth every 4 hours as needed for wheezing, cough, and/or shortness of breath 02/24/19   Loleta Rose, MD  amLODipine (NORVASC) 10 MG tablet Take 1 tablet (10 mg total) by mouth daily. 03/24/18 09/04/28  Cuthriell, Delorise Royals, PA-C  ascorbic acid (VITAMIN C) 500 MG tablet Take 500 mg by mouth daily.    [provider]  cholecalciferol (VITAMIN D) 1000 units tablet Take 1,000 Units by mouth daily.    [provider]  diazepam (VALIUM) 5 MG tablet Take 1 tablet (5 mg total) by mouth every 8 (eight) hours as needed for muscle spasms. 08/21/19   Emily Filbert, MD  hydrALAZINE (APRESOLINE) 50 MG tablet Take 1 tablet (50 mg total) by mouth in the morning and at bedtime. 12/10/19 03/09/20  Marisue Ivan D, PA-C  hydrochlorothiazide (HYDRODIURIL) 25 MG tablet Take 1 tablet (25 mg total) by mouth daily. 01/09/20 04/08/20  Marisue Ivan D, PA-C  levothyroxine (SYNTHROID, LEVOTHROID) 88 MCG tablet Take 88 mcg by mouth daily before breakfast.  10/24/15 09/04/28  [provider]  predniSONE (DELTASONE) 10 MG tablet Take 3 tablets once a day for 4 days  03/01/20   Tommi Rumps, PA-C  vitamin B-12 (CYANOCOBALAMIN) 500 MCG tablet Take 500 mcg by mouth daily.    [provider]    Allergies Lisinopril and Amoxicillin  Family History  Problem Relation Age of Onset  . Hypertension Mother   . Diabetes Mother   . Diabetes Father   . Diabetes Brother   . Hypertension Brother   . Hypertension  Brother   . Heart attack Brother   . Diabetes Brother   . Breast cancer Neg Hx     Social History Social History   Tobacco Use  . Smoking status: Former Smoker    Quit date: 11/19/2015    Years since quitting: 4.6  . Smokeless tobacco: Never Used  Vaping Use  . Vaping Use: Never used  Substance Use Topics  . Alcohol use: Yes    Comment: occasionally  . Drug use: No    Review of Systems  Constitutional: No fever/chills Eyes: No visual changes. ENT: No sore throat. Cardiovascular: Denies chest pain. Respiratory: Denies shortness of breath. Gastrointestinal: No abdominal pain.  No nausea, no vomiting.  No diarrhea.  No constipation. Genitourinary: Negative for dysuria. Musculoskeletal: Positive for left shoulder pain.  Negative for back pain. Skin: Negative for rash. Neurological: Negative for headaches, focal weakness or numbness.   ____________________________________________   PHYSICAL EXAM:  VITAL SIGNS: ED Triage Vitals  Enc Vitals Group     BP 07/14/20 0037 (!) 161/75     Pulse Rate 07/14/20 0037 72     Resp 07/14/20 0037 18     Temp 07/14/20 0037 98.5 F (36.9 C)     Temp Source 07/14/20 0037 Oral     SpO2 07/14/20 0037 100 %     Weight 07/14/20 0038 170 lb (77.1 kg)     Height 07/14/20 0038 5\' 5"  (1.651 m)     Head Circumference --      Peak Flow --      Pain Score 07/14/20 0038 10     Pain Loc --      Pain Edu? --      Excl. in GC? --     Constitutional: Alert and oriented. Well appearing and in mild acute distress. Eyes: Conjunctivae are normal. PERRL. EOMI. Head: Atraumatic. Nose: No congestion/rhinnorhea. Mouth/Throat: Mucous membranes are moist.   Neck: No stridor.  No cervical tenderness to palpation.  Left trapezius tenderness and muscle spasms. Cardiovascular: Normal rate, regular rhythm. Grossly normal heart sounds.  Good peripheral circulation. Respiratory: Normal respiratory effort.  No retractions. Lungs CTAB. Gastrointestinal: Soft  and nontender to light or deep palpation. No distention. No abdominal bruits. No CVA tenderness. Musculoskeletal:  LUE: Shoulder limited range of motion secondary to pain.  No deformities noted.  2+ pulse.  Brisk, less than 5-second capillary refill. No lower extremity tenderness nor edema.  No joint effusions. Neurologic:  Normal speech and language. No gross focal neurologic deficits are appreciated. No gait instability. Skin:  Skin is warm, dry and intact. No rash noted. Psychiatric: Mood and affect are normal. Speech and behavior are normal.  ____________________________________________   LABS (all labs ordered are listed, but only abnormal results are displayed)  Labs Reviewed - No data to display ____________________________________________  EKG  ED ECG REPORT I, Jimy Gates J, the attending physician, personally viewed and interpreted this ECG.   Date: 07/14/2020  EKG Time: 0039  Rate: 66  Rhythm: normal EKG, normal sinus rhythm  Axis: Normal  Intervals:none  ST&T Change: Nonspecific  ____________________________________________  RADIOLOGY Don Perking, personally viewed and evaluated these images (plain radiographs) as part of my medical decision making, as well as reviewing the written report by the radiologist.  ED MD interpretation: None  Official radiology report(s): No results found.  ____________________________________________   PROCEDURES  Procedure(s) performed (including Critical Care):  Procedures   ____________________________________________   INITIAL IMPRESSION / ASSESSMENT AND PLAN / ED COURSE  As part of my medical decision making, I reviewed the following data within the electronic MEDICAL RECORD NUMBER Nursing notes reviewed and incorporated, EKG interpreted, Old chart reviewed, Notes from prior ED visits and [place] Controlled Substance Database     70 year old female presenting with left shoulder pain secondary to radiculopathy.  Will place  on steroid taper, NSAIDs, analgesia, muscle relaxer and patient will follow up with her PCP.  Strict return precautions given.  Patient verbalizes understanding agrees with plan of care.      ____________________________________________   FINAL CLINICAL IMPRESSION(S) / ED DIAGNOSES  Final diagnoses:  Acute pain of left shoulder  Cervical radiculopathy     ED Discharge Orders    None      *Please note:  [name] was evaluated in Emergency Department on 07/14/2020 for the symptoms described in the history of present illness. She was evaluated in the context of the global COVID-19 pandemic, which necessitated consideration that the patient might be at risk for infection with the SARS-CoV-2 virus that causes COVID-19. Institutional protocols and algorithms that pertain to the evaluation of patients at risk for COVID-19 are in a state of rapid change based on information released by regulatory bodies including the CDC and federal and state organizations. These policies and algorithms were followed during the patient's care in the ED.  Some ED evaluations and interventions may be delayed as a result of limited staffing during and the pandemic.*   Note:  This document was prepared using Dragon voice recognition software and may include unintentional dictation errors.   Irean Hong, MD 07/14/20 [phone]

## 2020-09-10 ENCOUNTER — Emergency Department: Payer: Medicare Other

## 2020-09-10 ENCOUNTER — Other Ambulatory Visit: Payer: Self-pay

## 2020-09-10 DIAGNOSIS — Z87891 Personal history of nicotine dependence: Secondary | ICD-10-CM | POA: Diagnosis not present

## 2020-09-10 DIAGNOSIS — E039 Hypothyroidism, unspecified: Secondary | ICD-10-CM | POA: Diagnosis not present

## 2020-09-10 DIAGNOSIS — I10 Essential (primary) hypertension: Secondary | ICD-10-CM | POA: Diagnosis not present

## 2020-09-10 DIAGNOSIS — R079 Chest pain, unspecified: Secondary | ICD-10-CM | POA: Insufficient documentation

## 2020-09-10 DIAGNOSIS — R1011 Right upper quadrant pain: Secondary | ICD-10-CM | POA: Insufficient documentation

## 2020-09-10 DIAGNOSIS — M549 Dorsalgia, unspecified: Secondary | ICD-10-CM | POA: Insufficient documentation

## 2020-09-10 DIAGNOSIS — Z79899 Other long term (current) drug therapy: Secondary | ICD-10-CM | POA: Insufficient documentation

## 2020-09-10 LAB — CBC WITH DIFFERENTIAL/PLATELET
Abs Immature Granulocytes: 0.06 10*3/uL (ref 0.00–0.07)
Basophils Absolute: 0 10*3/uL (ref 0.0–0.1)
Basophils Relative: 0 %
Eosinophils Absolute: 0.1 10*3/uL (ref 0.0–0.5)
Eosinophils Relative: 1 %
HCT: 39.4 % (ref 36.0–46.0)
Hemoglobin: 12.7 g/dL (ref 12.0–15.0)
Immature Granulocytes: 1 %
Lymphocytes Relative: 13 %
Lymphs Abs: 1.4 10*3/uL (ref 0.7–4.0)
MCH: 23.7 pg — ABNORMAL LOW (ref 26.0–34.0)
MCHC: 32.2 g/dL (ref 30.0–36.0)
MCV: 73.5 fL — ABNORMAL LOW (ref 80.0–100.0)
Monocytes Absolute: 0.8 10*3/uL (ref 0.1–1.0)
Monocytes Relative: 7 %
Neutro Abs: 8.5 10*3/uL — ABNORMAL HIGH (ref 1.7–7.7)
Neutrophils Relative %: 78 %
Platelets: 263 10*3/uL (ref 150–400)
RBC: 5.36 MIL/uL — ABNORMAL HIGH (ref 3.87–5.11)
RDW: 15.4 % (ref 11.5–15.5)
WBC: 10.7 10*3/uL — ABNORMAL HIGH (ref 4.0–10.5)
nRBC: 0 % (ref 0.0–0.2)

## 2020-09-10 LAB — COMPREHENSIVE METABOLIC PANEL
ALT: 14 U/L (ref 0–44)
AST: 18 U/L (ref 15–41)
Albumin: 4.6 g/dL (ref 3.5–5.0)
Alkaline Phosphatase: 75 U/L (ref 38–126)
Anion gap: 7 (ref 5–15)
BUN: 18 mg/dL (ref 8–23)
CO2: 25 mmol/L (ref 22–32)
Calcium: 9.1 mg/dL (ref 8.9–10.3)
Chloride: 101 mmol/L (ref 98–111)
Creatinine, Ser: 0.67 mg/dL (ref 0.44–1.00)
GFR, Estimated: >60 mL/min (ref >60)
Glucose, Bld: 102 mg/dL — ABNORMAL HIGH (ref 70–99)
Potassium: 3.5 mmol/L (ref 3.5–5.1)
Sodium: 133 mmol/L — ABNORMAL LOW (ref 135–145)
Total Bilirubin: 0.6 mg/dL (ref 0.3–1.2)
Total Protein: 8 g/dL (ref 6.5–8.1)

## 2020-09-10 LAB — LIPASE, BLOOD: Lipase: 21 U/L (ref 11–51)

## 2020-09-10 LAB — TROPONIN I (HIGH SENSITIVITY)
Troponin I (High Sensitivity): 5 ng/L (ref <18)
Troponin I (High Sensitivity): 5 ng/L (ref <18)

## 2020-09-10 NOTE — ED Triage Notes (Signed)
Pt comes via EMs with c/o back pain and some breast pain. Pt states  under right breast to back. Pt states this started this am. Pt denies any recent injuries.  EMS reports VSS

## 2020-09-10 NOTE — ED Triage Notes (Signed)
Pt reports pain under right breast radiating to back. Hx cardiac ablasion. Pt moaning in triage. Denies N/V

## 2020-09-11 ENCOUNTER — Emergency Department
Admission: EM | Admit: 2020-09-11 | Discharge: 2020-09-11 | Disposition: A | Discharge status: Home / Self Care | Payer: Medicare Other | Attending: Emergency Medicine | Admitting: Emergency Medicine

## 2020-09-11 ENCOUNTER — Emergency Department: Payer: Medicare Other

## 2020-09-11 DIAGNOSIS — R1011 Right upper quadrant pain: Secondary | ICD-10-CM

## 2020-09-11 DIAGNOSIS — R079 Chest pain, unspecified: Secondary | ICD-10-CM

## 2020-09-11 LAB — URINALYSIS, COMPLETE (UACMP) WITH MICROSCOPIC
Bacteria, UA: NONE SEEN
Bilirubin Urine: NEGATIVE
Glucose, UA: NEGATIVE mg/dL
Hgb urine dipstick: NEGATIVE
Ketones, ur: NEGATIVE mg/dL
Nitrite: NEGATIVE
Protein, ur: NEGATIVE mg/dL
Specific Gravity, Urine: 1.013 (ref 1.005–1.030)
pH: 5 (ref 5.0–8.0)

## 2020-09-11 LAB — D-DIMER, QUANTITATIVE: D-Dimer, Quant: 0.29 ug/mL-FEU (ref 0.00–0.50)

## 2020-09-11 MED ORDER — NAPROXEN 500 MG PO TABS: 500.0000 mg | ORAL_TABLET | Freq: Two times a day (BID) | ORAL | 0 refills | Status: DC

## 2020-09-11 MED ORDER — KETOROLAC TROMETHAMINE 30 MG/ML IJ SOLN
30.0000 mg | Freq: Once | INTRAMUSCULAR | Status: AC
  Administered 2020-09-11: 30 mg via INTRAVENOUS
  Filled 2020-09-11: qty 1

## 2020-09-11 NOTE — ED Provider Notes (Signed)
[facility] Emergency Department Provider Note  ____________________________________________   Event Date/Time   First MD Initiated Contact with Patient 09/11/20 0030     (approximate)  I have reviewed the triage vital signs and the nursing notes.   HISTORY  Chief Complaint Back Pain and Chest Pain    HPI [name] is a 70 y.o. female with history of atrial flutter, hypertension, hypothyroidism who presents to the emergency department complaints of right-sided chest pain that has been ongoing for the past day.  She describes it as a cramping, aching, throbbing pain that radiates into her back.  States pain is worse with movement.  She denies any injury or lifting anything heavy.  She tried taking a muscle relaxer and ibuprofen without relief of her pain.  She denies fevers, cough, shortness of breath, nausea, vomiting, diarrhea, dysuria, hematuria.  She has had previous hysterectomy but still has her gallbladder.  No history of PE or DVT.  No lower extremity swelling or pain.  She is not sure if she is on any hormone replacement but states she takes something for her hot flashes but cannot remember the name of this medication.         Past Medical History:  Diagnosis Date   Atrial flutter (HCC) 09/2015   a. CHADS2VASc 2 (HTN, female); b. on Eliquis; c. echo 7/17: EF 50-55%, no RWMA, rhythm atrial flutter, mildly dilated LA/RV, moderately to severely dilated RA, trivial pericardial effusion   Essential hypertension    GERD (gastroesophageal reflux disease)    Subclinical hypothyroidism    Tobacco abuse     Patient Active Problem List   Diagnosis Date Noted   Atrial flutter (HCC) 01/16/2016   SOB (shortness of breath)    Subclinical hypothyroidism    Typical atrial flutter (HCC) 09/24/2015   Essential hypertension 09/23/2015   Tobacco abuse 09/23/2015    Past Surgical History:  Procedure Laterality Date   ABDOMINAL HYSTERECTOMY      ABLATION OF DYSRHYTHMIC FOCUS  01/16/2016   ELECTROPHYSIOLOGIC STUDY N/A 11/11/2015   Procedure: CARDIOVERSION;  Surgeon: Antonieta Iba, MD;  Location: [facility] ORS;  Service: Cardiovascular;  Laterality: N/A;   ELECTROPHYSIOLOGIC STUDY N/A 01/16/2016   Procedure: A-Flutter Ablation;  Surgeon: Duke Salvia, MD;  Location: [facility] INVASIVE CV LAB;  Service: Cardiovascular;  Laterality: N/A;    Prior to Admission medications   Medication Sig Start Date End Date Taking? Authorizing Provider  naproxen (NAPROSYN) 500 MG tablet Take 1 tablet (500 mg total) by mouth 2 (two) times daily with a meal. As needed for pain 09/11/20 09/11/21 Yes Elissa Grieshop, Layla Maw, DO  acetaminophen (TYLENOL) 500 MG tablet Take 500 mg by mouth every 6 (six) hours as needed for moderate pain or headache.    [provider]  albuterol (VENTOLIN HFA) 108 (90 Base) MCG/ACT inhaler Inhale 2-4 puffs by mouth every 4 hours as needed for wheezing, cough, and/or shortness of breath 02/24/19   Loleta Rose, MD  amLODipine (NORVASC) 10 MG tablet Take 1 tablet (10 mg total) by mouth daily. 03/24/18 09/04/28  Cuthriell, Delorise Royals, PA-C  ascorbic acid (VITAMIN C) 500 MG tablet Take 500 mg by mouth daily.    [provider]  cholecalciferol (VITAMIN D) 1000 units tablet Take 1,000 Units by mouth daily.    [provider]  cyclobenzaprine (FLEXERIL) 5 MG tablet 1 tablet every 8 hours as he did for muscle spasms 07/14/20   Irean Hong, MD  diazepam (  VALIUM) 5 MG tablet Take 1 tablet (5 mg total) by mouth every 8 (eight) hours as needed for muscle spasms. 08/21/19   Emily FilbertWilliams, Jonathan E, MD  hydrALAZINE (APRESOLINE) 50 MG tablet Take 1 tablet (50 mg total) by mouth in the morning and at bedtime. 12/10/19 03/09/20  Marisue IvanVisser, Jacquelyn D, PA-C  hydrochlorothiazide (HYDRODIURIL) 25 MG tablet Take 1 tablet (25 mg total) by mouth daily. 01/09/20 04/08/20  Marisue IvanVisser, Jacquelyn D, PA-C  HYDROcodone-acetaminophen (NORCO) 5-325 MG tablet Take 1  tablet by mouth every 6 (six) hours as needed for moderate pain. 07/14/20   Irean HongSung, Jade J, MD  ibuprofen (ADVIL) 800 MG tablet Take 1 tablet (800 mg total) by mouth every 8 (eight) hours as needed for moderate pain. 07/14/20   Irean HongSung, Jade J, MD  levothyroxine (SYNTHROID, LEVOTHROID) 88 MCG tablet Take 88 mcg by mouth daily before breakfast.  10/24/15 09/04/28  [provider]  methylPREDNISolone (MEDROL DOSEPAK) 4 MG TBPK tablet Take as directed 07/14/20   Irean HongSung, Jade J, MD  predniSONE (DELTASONE) 10 MG tablet Take 3 tablets once a day for 4 days 03/01/20   Tommi RumpsSummers, Rhonda L, PA-C  vitamin B-12 (CYANOCOBALAMIN) 500 MCG tablet Take 500 mcg by mouth daily.    [provider]    Allergies Lisinopril and Amoxicillin  Family History  Problem Relation Age of Onset   Hypertension Mother    Diabetes Mother    Diabetes Father    Diabetes Brother    Hypertension Brother    Hypertension Brother    Heart attack Brother    Diabetes Brother    Breast cancer Neg Hx     Social History Social History   Tobacco Use   Smoking status: Former    Pack years: 0.00    Types: Cigarettes    Quit date: 11/19/2015    Years since quitting: 4.8   Smokeless tobacco: Never  Vaping Use   Vaping Use: Never used  Substance Use Topics   Alcohol use: Yes    Comment: occasionally   Drug use: No    Review of Systems Constitutional: No fever. Eyes: No visual changes. ENT: No sore throat. Cardiovascular: + chest pain. Respiratory: Denies shortness of breath. Gastrointestinal: No nausea, vomiting, diarrhea. Genitourinary: Negative for dysuria. Musculoskeletal: Negative for back pain. Skin: Negative for rash. Neurological: Negative for focal weakness or numbness.  ____________________________________________   PHYSICAL EXAM:  VITAL SIGNS: ED Triage Vitals  Enc Vitals Group     BP 09/10/20 1809 112/90     Pulse Rate 09/10/20 1809 77     Resp 09/10/20 1809 (!) 22     Temp 09/10/20 1809 98.2  F (36.8 C)     Temp Source 09/10/20 1809 Oral     SpO2 09/10/20 1809 100 %     Weight --      Height --      Head Circumference --      Peak Flow --      Pain Score 09/10/20 1804 10     Pain Loc --      Pain Edu? --      Excl. in GC? --    CONSTITUTIONAL: Alert and oriented and responds appropriately to questions.  Appears uncomfortable but is afebrile, nontoxic HEAD: Normocephalic EYES: Conjunctivae clear, pupils appear equal, EOM appear intact ENT: normal nose; moist mucous membranes NECK: Supple, normal ROM CARD: RRR; S1 and S2 appreciated; no murmurs, no clicks, no rubs, no gallops RESP: Normal chest excursion without splinting or  tachypnea; breath sounds clear and equal bilaterally; no wheezes, no rhonchi, no rales, no hypoxia or respiratory distress, speaking full sentences ABD/GI: Normal bowel sounds; non-distended; soft, patient does have some tenderness below the right ribs, negative Murphy sign, no rebound, no guarding, no peritoneal signs, no hepatosplenomegaly BACK: The back appears normal, tender to palpation over the right lateral and anterior ribs without deformity, crepitus, ecchymosis, redness or warmth.  No rash.  No midline spinal tenderness, step-off or deformity. EXT: Normal ROM in all joints; no deformity noted, no edema; no cyanosis, no calf tenderness or calf swelling SKIN: Normal color for age and race; warm; no rash on exposed skin NEURO: Moves all extremities equally PSYCH: The patient's mood and manner are appropriate.  ____________________________________________   LABS (all labs ordered are listed, but only abnormal results are displayed)  Labs Reviewed  CBC WITH DIFFERENTIAL/PLATELET - Abnormal; Notable for the following components:      Result Value   WBC 10.7 (*)    RBC 5.36 (*)    MCV 73.5 (*)    MCH 23.7 (*)    Neutro Abs 8.5 (*)    All other components within normal limits  COMPREHENSIVE METABOLIC PANEL - Abnormal; Notable for the  following components:   Sodium 133 (*)    Glucose, Bld 102 (*)    All other components within normal limits  URINALYSIS, COMPLETE (UACMP) WITH MICROSCOPIC - Abnormal; Notable for the following components:   Color, Urine YELLOW (*)    APPearance CLEAR (*)    Leukocytes,Ua LARGE (*)    All other components within normal limits  LIPASE, BLOOD  D-DIMER, QUANTITATIVE  TROPONIN I (HIGH SENSITIVITY)  TROPONIN I (HIGH SENSITIVITY)   ____________________________________________  EKG   EKG Interpretation  Date/Time:  Wednesday September 10 2020 18:05:48 EDT Ventricular Rate:  80 PR Interval:  150 QRS Duration: 90 QT Interval:  390 QTC Calculation: 449 R Axis:   93 Text Interpretation: Normal sinus rhythm Rightward axis Biventricular hypertrophy with repolarization abnormality Abnormal ECG Confirmed by Rochele Raring [phone]) on 09/11/2020 12:39:49 AM         ____________________________________________  RADIOLOGY Normajean Baxter Chalise Pe, personally viewed and evaluated these images (plain radiographs) as part of my medical decision making, as well as reviewing the written report by the radiologist.  ED MD interpretation: Chest x-ray clear.  Right upper quadrant ultrasound unremarkable.  No gallstones.  Official radiology report(s): DG Chest 2 View  Result Date: 09/10/2020 CLINICAL DATA:  Chest pain EXAM: CHEST - 2 VIEW COMPARISON:  02/24/2019 FINDINGS: No focal opacity or pleural effusion. Stable cardiomediastinal silhouette with aortic atherosclerosis. No pneumothorax. Linear scarring or atelectasis at the left base. IMPRESSION: No active cardiopulmonary disease. Electronically Signed   By: Jasmine Pang M.D.   On: 09/10/2020 19:03   US Abdomen Limited RUQ (LIVER/GB)  Result Date: 09/11/2020 CLINICAL DATA:  Right upper quadrant pain EXAM: ULTRASOUND ABDOMEN LIMITED RIGHT UPPER QUADRANT COMPARISON:  None. FINDINGS: Gallbladder: No gallstones or wall thickening visualized. No sonographic Murphy  sign noted by sonographer. Common bile duct: Diameter: Normal caliber, 3 mm Liver: Mildly increased echotexture suggesting fatty infiltration. No focal hepatic abnormality. Portal vein is patent on color Doppler imaging with normal direction of blood flow towards the liver. Other: None. IMPRESSION: Hepatic steatosis. No cholelithiasis or acute cholecystitis. Electronically Signed   By: Charlett Nose M.D.   On: 09/11/2020 01:50    ____________________________________________   PROCEDURES  Procedure(s) performed (including Critical Care):  Procedures    ____________________________________________  INITIAL IMPRESSION / ASSESSMENT AND PLAN / ED COURSE  As part of my medical decision making, I reviewed the following data within the electronic MEDICAL RECORD NUMBER Nursing notes reviewed and incorporated, Labs reviewed , EKG interpreted , Old EKG reviewed, Old chart reviewed, Radiograph reviewed , Notes from prior ED visits, and [place] Controlled Substance Database         Patient here with atypical right-sided chest pain.  May be musculoskeletal in nature but she describes being on medication for her hot flashes which could be hormone replacement.  D-dimer is ordered to rule out PE as this is on the differential.  Chest x-ray shows no edema, infiltrate, rib fracture.  No rash or sign of other lesions including cellulitis, shingles to the right lateral and anterior chest wall.  Pain seems atypical for ACS and she has 2 normal troponins and a nonischemic EKG. doubt dissection.  Differential also includes cholecystitis, cholangitis, biliary colic.  Will obtain right upper quadrant ultrasound.  LFTs, lipase normal.  Discussed with patient that this could also be kidney stone, pyelonephritis.  Will obtain urinalysis.  Will give Toradol for pain and reassess.  ED PROGRESS  Patient's urine shows no signs of obvious infection, blood.  Right upper quadrant ultrasound normal.  D-dimer negative.  Patient reports  improvement after Toradol.  I suspect this is musculoskeletal in nature.  Will discharge with naproxen.  She does have a PCP for follow-up.  I feel she is safe for discharge home.  At this time, I do not feel there is any life-threatening condition present. I have reviewed, interpreted and discussed all results (EKG, imaging, lab, urine as appropriate) and exam findings with patient/family. I have reviewed nursing notes and appropriate previous records.  I feel the patient is safe to be discharged home without further emergent workup and can continue workup as an outpatient as needed. Discussed usual and customary return precautions. Patient/family verbalize understanding and are comfortable with this plan.  Outpatient follow-up has been provided as needed. All questions have been answered.  ____________________________________________   FINAL CLINICAL IMPRESSION(S) / ED DIAGNOSES  Final diagnoses:  RUQ pain  Right-sided chest pain     ED Discharge Orders          Ordered    naproxen (NAPROSYN) 500 MG tablet  2 times daily with meals        09/11/20 0237            *Please note:  [name] was evaluated in Emergency Department on 09/11/2020 for the symptoms described in the history of present illness. She was evaluated in the context of the global COVID-19 pandemic, which necessitated consideration that the patient might be at risk for infection with the SARS-CoV-2 virus that causes COVID-19. Institutional protocols and algorithms that pertain to the evaluation of patients at risk for COVID-19 are in a state of rapid change based on information released by regulatory bodies including the CDC and federal and state organizations. These policies and algorithms were followed during the patient's care in the ED.  Some ED evaluations and interventions may be delayed as a result of limited staffing during and the pandemic.*   Note:  This document was prepared using Dragon voice  recognition software and may include unintentional dictation errors.    Anisia Leija, Layla Maw, DO 09/11/20 [phone]

## 2020-09-23 ENCOUNTER — Other Ambulatory Visit: Payer: Self-pay | Admitting: Physician Assistant

## 2020-09-23 DIAGNOSIS — Z13820 Encounter for screening for osteoporosis: Secondary | ICD-10-CM

## 2020-12-30 ENCOUNTER — Other Ambulatory Visit: Payer: Self-pay | Admitting: Physician Assistant

## 2020-12-30 DIAGNOSIS — R928 Other abnormal and inconclusive findings on diagnostic imaging of breast: Secondary | ICD-10-CM

## 2021-01-08 ENCOUNTER — Ambulatory Visit: Payer: Medicare Other | Admitting: Cardiovascular Disease

## 2021-01-09 ENCOUNTER — Emergency Department: Payer: Medicare Other

## 2021-01-09 ENCOUNTER — Other Ambulatory Visit: Payer: Self-pay

## 2021-01-09 ENCOUNTER — Emergency Department
Admission: EM | Admit: 2021-01-09 | Discharge: 2021-01-09 | Disposition: A | Discharge status: Home / Self Care | Payer: Medicare Other | Attending: Emergency Medicine | Admitting: Emergency Medicine

## 2021-01-09 DIAGNOSIS — E039 Hypothyroidism, unspecified: Secondary | ICD-10-CM | POA: Insufficient documentation

## 2021-01-09 DIAGNOSIS — Z79899 Other long term (current) drug therapy: Secondary | ICD-10-CM | POA: Diagnosis not present

## 2021-01-09 DIAGNOSIS — I10 Essential (primary) hypertension: Secondary | ICD-10-CM | POA: Diagnosis not present

## 2021-01-09 DIAGNOSIS — Z87891 Personal history of nicotine dependence: Secondary | ICD-10-CM | POA: Insufficient documentation

## 2021-01-09 DIAGNOSIS — M79605 Pain in left leg: Secondary | ICD-10-CM | POA: Diagnosis present

## 2021-01-09 MED ORDER — LIDOCAINE 5 % EX PTCH: 1.0000 | MEDICATED_PATCH | Freq: Two times a day (BID) | CUTANEOUS | 0 refills | Status: DC

## 2021-01-09 MED ORDER — ACETAMINOPHEN 500 MG PO TABS
1000.0000 mg | ORAL_TABLET | Freq: Once | ORAL | Status: AC
  Administered 2021-01-09: 1000 mg via ORAL
  Filled 2021-01-09: qty 2

## 2021-01-09 MED ORDER — NAPROXEN 500 MG PO TABS
500.0000 mg | ORAL_TABLET | Freq: Once | ORAL | Status: AC
  Administered 2021-01-09: 500 mg via ORAL
  Filled 2021-01-09: qty 1

## 2021-01-09 MED ORDER — METHOCARBAMOL 500 MG PO TABS
500.0000 mg | ORAL_TABLET | Freq: Once | ORAL | Status: AC
  Administered 2021-01-09: 500 mg via ORAL
  Filled 2021-01-09 (×2): qty 1

## 2021-01-09 MED ORDER — OXYCODONE HCL 5 MG PO TABS
5.0000 mg | ORAL_TABLET | Freq: Once | ORAL | Status: AC
  Administered 2021-01-09: 5 mg via ORAL
  Filled 2021-01-09: qty 1

## 2021-01-09 MED ORDER — OXYCODONE-ACETAMINOPHEN 5-325 MG PO TABS
1.0000 | ORAL_TABLET | Freq: Once | ORAL | Status: AC
  Administered 2021-01-09: 1 via ORAL
  Filled 2021-01-09: qty 1

## 2021-01-09 MED ORDER — LIDOCAINE 5 % EX PTCH
1.0000 | MEDICATED_PATCH | Freq: Once | CUTANEOUS | Status: DC
  Administered 2021-01-09: 1 via TRANSDERMAL
  Filled 2021-01-09: qty 1

## 2021-01-09 NOTE — Discharge Instructions (Signed)
Use Tylenol for pain and fevers.  Up to 1000 mg per dose, up to 4 times per day.  Do not take more than 4000 mg of Tylenol/acetaminophen within 24 hours..  Please use lidocaine patches and your site of pain.  Apply 1 patch at a time, leave on for 12 hours, then remove for 12 hours.  12 hours on, 12 hours off.  Do not apply more than 1 patch at a time.  

## 2021-01-09 NOTE — ED Provider Notes (Signed)
Emergency Medicine Provider Triage Evaluation Note  [name] , a 70 y.o. female  was evaluated in triage.  Pt complains of pain that radiates from lower back to the left leg all the way to her toes.  Worsening and severe.  Review of Systems  Positive: Back pain with radiation left leg Negative: No abdominal pain, loss of bowel or bladder control, injury  Physical Exam  BP (!) 146/84 (BP Location: Left Arm)   Pulse 79   Temp 98.1 F (36.7 C) (Oral)   Resp 18   Ht 5\' 5"  (1.651 m)   Wt 76.2 kg   SpO2 100%   BMI 27.96 kg/m  Gen:   Awake, no distress   Resp:  Normal effort  MSK:   Moves extremities without difficulty  Other:    Medical Decision Making  Medically screening exam initiated at 1:22 PM.  Appropriate orders placed.  [name] was informed that the remainder of the evaluation will be completed by another provider, this initial triage assessment does not replace that evaluation, and the importance of remaining in the ED until their evaluation is complete.     [name], PA-C 01/09/21 1323    01/11/21, MD 01/09/21 1524

## 2021-01-09 NOTE — ED Triage Notes (Signed)
Pt here with left leg pain x1 week. Pt states that she saw her primary who gave her medication but it has not helped. Pt thinks that it may be related to a nerve. Pt in NAD in triage.

## 2021-01-09 NOTE — ED Notes (Signed)
MD at bedside. 

## 2021-01-09 NOTE — ED Triage Notes (Signed)
First Nurse Note:  C/O posterior left leg pain x 1 week.  States pain starts mid thigh and radiates down leg.  Pain worse when sitting down.  Seen by PCP for same, not improving.  AAOx3.  Skin warm and dry. NAD

## 2021-01-09 NOTE — ED Provider Notes (Signed)
[facility] Emergency Department Provider Note ____________________________________________   Event Date/Time   First MD Initiated Contact with Patient 01/09/21 1825     (approximate)  I have reviewed the triage vital signs and the nursing notes.  HISTORY  Chief Complaint Leg Pain   HPI [name] is a 70 y.o. femalewho presents to the ED for evaluation of leg pain.   Chart review indicates HTN, hypothyroidism and tobacco abuse.  Patient presents to the ED for evaluation of about 10 days of atraumatic left calf and popliteal pain.  She reports no inciting incidents, falls or trauma, awakening with this pain about 10 days ago.  Reports waxing and waning symptoms of the past 10 days, but has had a constant aching to the back of her leg, making ambulation difficult.  She reports no associated symptoms, such as swelling, rash, systemic symptoms, fever, chest pain or shortness of breath.  Reports up to "10,000/10" pain is constant and aching.  Radiates down her leg.  No back pain   Past Medical History:  Diagnosis Date   Atrial flutter (HCC) 09/2015   a. CHADS2VASc 2 (HTN, female); b. on Eliquis; c. echo 7/17: EF 50-55%, no RWMA, rhythm atrial flutter, mildly dilated LA/RV, moderately to severely dilated RA, trivial pericardial effusion   Essential hypertension    GERD (gastroesophageal reflux disease)    Subclinical hypothyroidism    Tobacco abuse     Patient Active Problem List   Diagnosis Date Noted   Atrial flutter (HCC) 01/16/2016   SOB (shortness of breath)    Subclinical hypothyroidism    Typical atrial flutter (HCC) 09/24/2015   Essential hypertension 09/23/2015   Tobacco abuse 09/23/2015    Past Surgical History:  Procedure Laterality Date   ABDOMINAL HYSTERECTOMY     ABLATION OF DYSRHYTHMIC FOCUS  01/16/2016   ELECTROPHYSIOLOGIC STUDY N/A 11/11/2015   Procedure: CARDIOVERSION;  Surgeon: Antonieta Iba, MD;  Location: [facility] ORS;   Service: Cardiovascular;  Laterality: N/A;   ELECTROPHYSIOLOGIC STUDY N/A 01/16/2016   Procedure: A-Flutter Ablation;  Surgeon: Duke Salvia, MD;  Location: [facility] INVASIVE CV LAB;  Service: Cardiovascular;  Laterality: N/A;    Prior to Admission medications   Medication Sig Start Date End Date Taking? Authorizing Provider  lidocaine (LIDODERM) 5 % Place 1 patch onto the skin every 12 (twelve) hours. Remove & Discard patch within 12 hours or as directed by MD 01/09/21 01/09/22 Yes Delton Prairie, MD  acetaminophen (TYLENOL) 500 MG tablet Take 500 mg by mouth every 6 (six) hours as needed for moderate pain or headache.    [provider]  albuterol (VENTOLIN HFA) 108 (90 Base) MCG/ACT inhaler Inhale 2-4 puffs by mouth every 4 hours as needed for wheezing, cough, and/or shortness of breath 02/24/19   Loleta Rose, MD  amLODipine (NORVASC) 10 MG tablet Take 1 tablet (10 mg total) by mouth daily. 03/24/18 09/04/28  Cuthriell, Delorise Royals, PA-C  ascorbic acid (VITAMIN C) 500 MG tablet Take 500 mg by mouth daily.    [provider]  cholecalciferol (VITAMIN D) 1000 units tablet Take 1,000 Units by mouth daily.    [provider]  cyclobenzaprine (FLEXERIL) 5 MG tablet 1 tablet every 8 hours as he did for muscle spasms 07/14/20   Irean Hong, MD  diazepam (VALIUM) 5 MG tablet Take 1 tablet (5 mg total) by mouth every 8 (eight) hours as needed for muscle spasms. 08/21/19   Emily Filbert, MD  hydrALAZINE (  APRESOLINE) 50 MG tablet Take 1 tablet (50 mg total) by mouth in the morning and at bedtime. 12/10/19 03/09/20  Marisue Ivan D, PA-C  hydrochlorothiazide (HYDRODIURIL) 25 MG tablet Take 1 tablet (25 mg total) by mouth daily. 01/09/20 04/08/20  Marisue Ivan D, PA-C  HYDROcodone-acetaminophen (NORCO) 5-325 MG tablet Take 1 tablet by mouth every 6 (six) hours as needed for moderate pain. 07/14/20   Irean Hong, MD  ibuprofen (ADVIL) 800 MG tablet Take 1 tablet (800 mg total) by  mouth every 8 (eight) hours as needed for moderate pain. 07/14/20   Irean Hong, MD  levothyroxine (SYNTHROID, LEVOTHROID) 88 MCG tablet Take 88 mcg by mouth daily before breakfast.  10/24/15 09/04/28  [provider]  methylPREDNISolone (MEDROL DOSEPAK) 4 MG TBPK tablet Take as directed 07/14/20   Irean Hong, MD  naproxen (NAPROSYN) 500 MG tablet Take 1 tablet (500 mg total) by mouth 2 (two) times daily with a meal. As needed for pain 09/11/20 09/11/21  Ward, Layla Maw, DO  predniSONE (DELTASONE) 10 MG tablet Take 3 tablets once a day for 4 days 03/01/20   Tommi Rumps, PA-C  vitamin B-12 (CYANOCOBALAMIN) 500 MCG tablet Take 500 mcg by mouth daily.    [provider]    Allergies Lisinopril and Amoxicillin  Family History  Problem Relation Age of Onset   Hypertension Mother    Diabetes Mother    Diabetes Father    Diabetes Brother    Hypertension Brother    Hypertension Brother    Heart attack Brother    Diabetes Brother    Breast cancer Neg Hx     Social History Social History   Tobacco Use   Smoking status: Former    Types: Cigarettes    Quit date: 11/19/2015    Years since quitting: 5.1   Smokeless tobacco: Never  Vaping Use   Vaping Use: Never used  Substance Use Topics   Alcohol use: Yes    Comment: occasionally   Drug use: No    Review of Systems  Constitutional: No fever/chills Eyes: No visual changes. ENT: No sore throat. Cardiovascular: Denies chest pain. Respiratory: Denies shortness of breath. Gastrointestinal: No abdominal pain.  No nausea, no vomiting.  No diarrhea.  No constipation. Genitourinary: Negative for dysuria. Musculoskeletal: Negative for back pain. Positive atraumatic left leg pain. Skin: Negative for rash. Neurological: Negative for headaches, focal weakness or numbness.  ____________________________________________   PHYSICAL EXAM:  VITAL SIGNS: Vitals:   01/09/21 1900 01/09/21 2014  BP: (!) 112/93 (!) 134/56   Pulse: 82 (!) 56  Resp: 20 20  Temp:    SpO2: 100% 100%    Constitutional: Alert and oriented. Well appearing and in no acute distress. Ambulatory in the room with an antalgic gait. Eyes: Conjunctivae are normal. PERRL. EOMI. Head: Atraumatic. Nose: No congestion/rhinnorhea. Mouth/Throat: Mucous membranes are moist.  Oropharynx non-erythematous. Neck: No stridor. No cervical spine tenderness to palpation. Cardiovascular: Normal rate, regular rhythm. Grossly normal heart sounds.  Good peripheral circulation. Respiratory: Normal respiratory effort.  No retractions. Lungs CTAB. Gastrointestinal: Soft , nondistended, nontender to palpation. No CVA tenderness. Musculoskeletal: No lower extremity edema.  No joint effusions. No signs of acute trauma Most tender to left proximal calf and popliteal fossa without overlying skin changes, signs of trauma, induration, fluctuance or swelling.  Leg appears similar to the right. Some pain at the site with passive flexion of the left ankle. Neurologic:  Normal speech and language. No gross  focal neurologic deficits are appreciated. No gait instability noted. Skin:  Skin is warm, dry and intact. No rash noted. Psychiatric: Mood and affect are normal. Speech and behavior are normal. ____________________________________________   LABS (all labs ordered are listed, but only abnormal results are displayed)  Labs Reviewed - No data to display ____________________________________________  12 Lead EKG   ____________________________________________  RADIOLOGY  ED MD interpretation: Plain film of the lumbar spine reviewed by me without evidence of fracture or dislocation  Official radiology report(s): DG Lumbar Spine 2-3 Views  Result Date: 01/09/2021 CLINICAL DATA:  Low back and left leg pain without known injury. EXAM: LUMBAR SPINE - 2-3 VIEW COMPARISON:  March 28, 2019. FINDINGS: Minimal grade 1 anterolisthesis of L4-5 is noted secondary to  posterior facet joint hypertrophy. Moderate degenerative disc disease is noted at L5-S1. Minimal anterior osteophyte formation is noted at L2-3. No fracture is noted. IMPRESSION: Multilevel degenerative changes as described above. No acute abnormality seen. Electronically Signed   By: Lupita Raider M.D.   On: 01/09/2021 13:59   US Venous Img Lower Unilateral Left  Result Date: 01/09/2021 CLINICAL DATA:  Acute left calf pain. EXAM: Left LOWER EXTREMITY VENOUS DOPPLER ULTRASOUND TECHNIQUE: Gray-scale sonography with compression, as well as color and duplex ultrasound, were performed to evaluate the deep venous system(s) from the level of the common femoral vein through the popliteal and proximal calf veins. COMPARISON:  None. FINDINGS: VENOUS Normal compressibility of the common femoral, superficial femoral, and popliteal veins, as well as the visualized calf veins. Visualized portions of profunda femoral vein and great saphenous vein unremarkable. No filling defects to suggest DVT on grayscale or color Doppler imaging. Doppler waveforms show normal direction of venous flow, normal respiratory plasticity and response to augmentation. Limited views of the contralateral common femoral vein are unremarkable. OTHER None. Limitations: none IMPRESSION: Negative. Electronically Signed   By: Lupita Raider M.D.   On: 01/09/2021 19:52    ____________________________________________   PROCEDURES and INTERVENTIONS  Procedure(s) performed (including Critical Care):  Procedures  Medications  lidocaine (LIDODERM) 5 % 1 patch (has no administration in time range)  oxyCODONE-acetaminophen (PERCOCET/ROXICET) 5-325 MG per tablet 1 tablet (1 tablet Oral Given 01/09/21 1328)  acetaminophen (TYLENOL) tablet 1,000 mg (1,000 mg Oral Given 01/09/21 1854)  naproxen (NAPROSYN) tablet 500 mg (500 mg Oral Given 01/09/21 1854)  oxyCODONE (Oxy IR/ROXICODONE) immediate release tablet 5 mg (5 mg Oral Given 01/09/21 1855)   methocarbamol (ROBAXIN) tablet 500 mg (500 mg Oral Given 01/09/21 1855)    ____________________________________________   MDM / ED COURSE   70 year old woman presents with subacute atraumatic left leg pain, possibly MSK in etiology, and amenable to outpatient management.  No signs of DVT, systemic illness, overlying infection such as cellulitis or abscess.  No signs of neurologic or vascular deficits.  We will discharge with return precautions and symptomatic control.  Clinical Course as of 01/09/21 2018  Caleen Essex Jan 09, 2021  2017 Reassessed.  Patient reports feeling better.  We discussed reassuring work-up.  We discussed management at home and return precautions for the ED.  She expressed understanding and agreement. [DS]    Clinical Course User Index [DS] Delton Prairie, MD    ____________________________________________   FINAL CLINICAL IMPRESSION(S) / ED DIAGNOSES  Final diagnoses:  Left leg pain     ED Discharge Orders          Ordered    lidocaine (LIDODERM) 5 %  Every 12 hours  01/09/21 2017             Delton Prairie   Note:  This document was prepared using Dragon voice recognition software and may include unintentional dictation errors.    Delton Prairie, MD 01/09/21 2041

## 2021-01-16 ENCOUNTER — Ambulatory Visit (INDEPENDENT_AMBULATORY_CARE_PROVIDER_SITE_OTHER): Payer: Medicare Other | Admitting: Cardiovascular Disease

## 2021-01-16 ENCOUNTER — Encounter: Payer: Self-pay | Admitting: Cardiovascular Disease

## 2021-01-16 ENCOUNTER — Other Ambulatory Visit: Payer: Self-pay

## 2021-01-16 VITALS — BP 150/78 | HR 70 | Ht 65.0 in | Wt 168.4 lb

## 2021-01-16 DIAGNOSIS — I5032 Chronic diastolic (congestive) heart failure: Secondary | ICD-10-CM

## 2021-01-16 DIAGNOSIS — Z8679 Personal history of other diseases of the circulatory system: Secondary | ICD-10-CM

## 2021-01-16 DIAGNOSIS — I10 Essential (primary) hypertension: Secondary | ICD-10-CM

## 2021-01-16 DIAGNOSIS — R001 Bradycardia, unspecified: Secondary | ICD-10-CM | POA: Diagnosis not present

## 2021-01-16 DIAGNOSIS — Z9889 Other specified postprocedural states: Secondary | ICD-10-CM | POA: Diagnosis not present

## 2021-01-16 NOTE — Patient Instructions (Signed)
Medication Instructions:  °- Your physician recommends that you continue on your current medications as directed. Please refer to the Current Medication list given to you today. ° °*If you need a refill on your cardiac medications before your next appointment, please call your pharmacy* ° ° °Lab Work: °- none ordered ° °If you have labs (blood work) drawn today and your tests are completely normal, you will receive your results only by: °MyChart Message (if you have MyChart) OR °A paper copy in the mail °If you have any lab test that is abnormal or we need to change your treatment, we will call you to review the results. ° ° °Testing/Procedures: °- none ordered ° ° °Follow-Up: °At [facility], you and your health needs are our priority.  As part of our continuing mission to provide you with exceptional heart care, we have created designated Provider Care Teams.  These Care Teams include your primary Cardiologist (physician) and Advanced Practice Providers (APPs -  Physician Assistants and Nurse Practitioners) who all work together to provide you with the care you need, when you need it. ° °We recommend signing up for the patient portal called "MyChart".  Sign up information is provided on this After Visit Summary.  MyChart is used to connect with patients for Virtual Visits (Telemedicine).  Patients are able to view lab/test results, encounter notes, upcoming appointments, etc.  Non-urgent messages can be sent to your provider as well.   °To learn more about what you can do with MyChart, go to [email or web address].   ° °Your next appointment:   °1 year(s) ° °The format for your next appointment:   °In Person ° °Provider:   °You may see Muhammad Arida, MD or one of the following Advanced Practice Providers on your designated Care Team:   °Christopher Berge, NP °Ryan Dunn, PA-C °Cadence Furth, PA-C  ° ° °Other Instructions °- N/a ° °

## 2021-01-16 NOTE — Progress Notes (Signed)
Cardiology Office Note   Date:  01/16/2021   ID:  [name], DOB 05-23-1950, MRN [id number]  PCP:  [facility]  Cardiologist:   Lorine Bears, MD   Chief Complaint  Patient presents with   Other    12 Month f/u c/o back calve/leg nerve pain. Meds reviewed verbally with pt.      History of Present Illness: [name] is a 70 y.o. female who presents for a follow-up visit. She has known history of typical atrial flutter status post ablation done by Dr. Graciela Husbands in 2017. She also has essential hypertension mild to moderate mitral regurgitation and previous tobacco use. She had angioedema on lisinopril.   She has been doing reasonably well from a cardiac standpoint with no chest pain, shortness of breath or palpitations.  She is mostly bothered by severe left leg pain which has been going on for about 1 month.  It was thought to be musculoskeletal but did not improve with the muscle relaxant.  She went to the emergency room recently and had a venous Doppler done which was negative for DVT.  She was prescribed Lyrica but has not started the medication yet.  The pain is constant at rest and gets worse with certain positions.  No back pain.   Past Medical History:  Diagnosis Date   Atrial flutter (HCC) 09/2015   a. CHADS2VASc 2 (HTN, female); b. on Eliquis; c. echo 7/17: EF 50-55%, no RWMA, rhythm atrial flutter, mildly dilated LA/RV, moderately to severely dilated RA, trivial pericardial effusion   Essential hypertension    GERD (gastroesophageal reflux disease)    Subclinical hypothyroidism    Tobacco abuse     Past Surgical History:  Procedure Laterality Date   ABDOMINAL HYSTERECTOMY     ABLATION OF DYSRHYTHMIC FOCUS  01/16/2016   ELECTROPHYSIOLOGIC STUDY N/A 11/11/2015   Procedure: CARDIOVERSION;  Surgeon: Antonieta Iba, MD;  Location: [facility] ORS;  Service: Cardiovascular;  Laterality: N/A;   ELECTROPHYSIOLOGIC STUDY N/A 01/16/2016   Procedure:  A-Flutter Ablation;  Surgeon: Duke Salvia, MD;  Location: [facility] INVASIVE CV LAB;  Service: Cardiovascular;  Laterality: N/A;     Current Outpatient Medications  Medication Sig Dispense Refill   acetaminophen (TYLENOL) 500 MG tablet Take 500 mg by mouth every 6 (six) hours as needed for moderate pain or headache.     albuterol (VENTOLIN HFA) 108 (90 Base) MCG/ACT inhaler Inhale 2-4 puffs by mouth every 4 hours as needed for wheezing, cough, and/or shortness of breath 8 g 1   amLODipine (NORVASC) 10 MG tablet Take 1 tablet (10 mg total) by mouth daily. 30 tablet 11   ascorbic acid (VITAMIN C) 500 MG tablet Take 500 mg by mouth daily.     cholecalciferol (VITAMIN D) 1000 units tablet Take 1,000 Units by mouth daily.     cyclobenzaprine (FLEXERIL) 5 MG tablet 1 tablet every 8 hours as he did for muscle spasms 15 tablet 0   diazepam (VALIUM) 5 MG tablet Take 1 tablet (5 mg total) by mouth every 8 (eight) hours as needed for muscle spasms. 20 tablet 0   hydrALAZINE (APRESOLINE) 50 MG tablet Take 1 tablet (50 mg total) by mouth in the morning and at bedtime. 60 tablet 3   ibuprofen (ADVIL) 800 MG tablet Take 1 tablet (800 mg total) by mouth every 8 (eight) hours as needed for moderate pain. 15 tablet 0   levothyroxine (SYNTHROID, LEVOTHROID) 88 MCG tablet Take 88 mcg  by mouth daily before breakfast.      lidocaine (LIDODERM) 5 % Place 1 patch onto the skin every 12 (twelve) hours. Remove & Discard patch within 12 hours or as directed by MD 10 patch 0   naproxen (NAPROSYN) 500 MG tablet Take 1 tablet (500 mg total) by mouth 2 (two) times daily with a meal. As needed for pain 60 tablet 0   vitamin B-12 (CYANOCOBALAMIN) 500 MCG tablet Take 500 mcg by mouth daily.     hydrochlorothiazide (HYDRODIURIL) 25 MG tablet Take 1 tablet (25 mg total) by mouth daily. (Patient not taking: Reported on 01/16/2021) 90 tablet 3   HYDROcodone-acetaminophen (NORCO) 5-325 MG tablet Take 1 tablet by mouth every 6 (six) hours  as needed for moderate pain. (Patient not taking: Reported on 01/16/2021) 15 tablet 0   methocarbamol (ROBAXIN) 500 MG tablet Take 500-1,000 mg by mouth every 6 (six) hours as needed.     methylPREDNISolone (MEDROL DOSEPAK) 4 MG TBPK tablet Take as directed (Patient not taking: Reported on 01/16/2021) 21 tablet 0   predniSONE (DELTASONE) 10 MG tablet Take 3 tablets once a day for 4 days (Patient not taking: Reported on 01/16/2021) 12 tablet 0   No current facility-administered medications for this visit.    Allergies:   Lisinopril and Amoxicillin    Social History:  The patient  reports that she quit smoking about 5 years ago. She has never used smokeless tobacco. She reports current alcohol use. She reports that she does not use drugs.   Family History:  The patient's family history includes Diabetes in her brother, brother, father, and mother; Heart attack in her brother; Hypertension in her brother, brother, and mother.    ROS:  Please see the history of present illness.   Otherwise, review of systems are positive for none.   All other systems are reviewed and negative.    PHYSICAL EXAM: VS:  BP (!) 150/78 (BP Location: Left Arm, Patient Position: Sitting, Cuff Size: Normal)   Pulse 70   Ht 5\' 5"  (1.651 m)   Wt 168 lb 6 oz (76.4 kg)   SpO2 98%   BMI 28.02 kg/m  , BMI Body mass index is 28.02 kg/m. GEN: Well nourished, well developed, in no acute distress  HEENT: normal  Neck: no JVD, carotid bruits, or masses Cardiac: RRR; no murmurs, rubs, or gallops,no edema  Respiratory:  clear to auscultation bilaterally, normal work of breathing GI: soft, nontender, nondistended, + BS MS: no deformity or atrophy  Skin: warm and dry, no rash Neuro:  Strength and sensation are intact Psych: euthymic mood, full affect Vascular: Distal pulses are palpable bilaterally.   EKG:  EKG is ordered today. The ekg ordered today demonstrates sinus rhythm with short PR.  Lateral T wave changes  suggestive of ischemia.    Recent Labs: 09/10/2020: ALT 14; BUN 18; Creatinine, Ser 0.67; Hemoglobin 12.7; Platelets 263; Potassium 3.5; Sodium 133    Lipid Panel No results found for: CHOL, TRIG, HDL, CHOLHDL, VLDL, LDLCALC, LDLDIRECT    Wt Readings from Last 3 Encounters:  01/16/21 168 lb 6 oz (76.4 kg)  01/09/21 168 lb (76.2 kg)  07/14/20 170 lb (77.1 kg)      PAD Screen 11/06/2015 10/09/2015  Previous PAD dx? No No  Previous surgical procedure? No No  Pain with walking? Yes No  Subsides with rest? Yes -  Feet/toe relief with dangling? No No  Painful, non-healing ulcers? No No  Extremities discolored? No No  ASSESSMENT AND PLAN:  1.  History of typical atrial flutter status post successful ablation: No evidence of recurrent arrhythmia. Continue observation.   2. Essential hypertension: Blood pressure is elevated today but likely due to pain in her left leg.  I made no changes in medications.  3.  Mitral regurgitation: Echocardiogram in June 2021 showed normal LV systolic function with mild to moderate mitral regurgitation.  No murmurs by exam today.  Continue to monitor clinically and consider repeat echocardiogram in few years.  4.  Left leg pain: Likely neuropathic or musculoskeletal.  Negative DVT study.  Distal pulses are palpable and thus does not seem to be vascular in etiology.    Disposition:   FU with me in 12 months  Signed,  Lorine Bears, MD  01/16/2021 12:11 PM    [facility]

## 2021-01-24 ENCOUNTER — Encounter: Payer: Self-pay | Admitting: Emergency Medicine

## 2021-01-24 ENCOUNTER — Emergency Department
Admission: EM | Admit: 2021-01-24 | Discharge: 2021-01-24 | Disposition: A | Discharge status: Home / Self Care | Payer: Medicare Other | Attending: Emergency Medicine | Admitting: Emergency Medicine

## 2021-01-24 ENCOUNTER — Emergency Department: Payer: Medicare Other

## 2021-01-24 DIAGNOSIS — E039 Hypothyroidism, unspecified: Secondary | ICD-10-CM | POA: Insufficient documentation

## 2021-01-24 DIAGNOSIS — M5432 Sciatica, left side: Secondary | ICD-10-CM | POA: Insufficient documentation

## 2021-01-24 DIAGNOSIS — Z87891 Personal history of nicotine dependence: Secondary | ICD-10-CM | POA: Insufficient documentation

## 2021-01-24 DIAGNOSIS — I10 Essential (primary) hypertension: Secondary | ICD-10-CM | POA: Diagnosis not present

## 2021-01-24 DIAGNOSIS — Z79899 Other long term (current) drug therapy: Secondary | ICD-10-CM | POA: Diagnosis not present

## 2021-01-24 DIAGNOSIS — M79605 Pain in left leg: Secondary | ICD-10-CM | POA: Diagnosis present

## 2021-01-24 MED ORDER — PREDNISONE 50 MG PO TABS: 50.0000 mg | ORAL_TABLET | Freq: Every day | ORAL | 0 refills | Status: DC

## 2021-01-24 MED ORDER — HYDROCODONE-ACETAMINOPHEN 5-325 MG PO TABS: 1.0000 | ORAL_TABLET | ORAL | 0 refills | Status: DC | PRN

## 2021-01-24 MED ORDER — MELOXICAM 7.5 MG PO TABS: 7.5000 mg | ORAL_TABLET | Freq: Every day | ORAL | 0 refills | Status: AC

## 2021-01-24 MED ORDER — KETOROLAC TROMETHAMINE 30 MG/ML IJ SOLN
30.0000 mg | Freq: Once | INTRAMUSCULAR | Status: AC
  Administered 2021-01-24: 30 mg via INTRAMUSCULAR
  Filled 2021-01-24: qty 1

## 2021-01-24 MED ORDER — DEXAMETHASONE SODIUM PHOSPHATE 10 MG/ML IJ SOLN
10.0000 mg | Freq: Once | INTRAMUSCULAR | Status: AC
  Administered 2021-01-24: 10 mg via INTRAMUSCULAR
  Filled 2021-01-24: qty 1

## 2021-01-24 MED ORDER — METHOCARBAMOL 500 MG PO TABS: 500.0000 mg | ORAL_TABLET | Freq: Four times a day (QID) | ORAL | 0 refills | Status: DC

## 2021-01-24 MED ORDER — OXYCODONE-ACETAMINOPHEN 5-325 MG PO TABS
1.0000 | ORAL_TABLET | Freq: Once | ORAL | Status: AC
  Administered 2021-01-24: 1 via ORAL
  Filled 2021-01-24: qty 1
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
## 2021-01-24 NOTE — ED Notes (Signed)
Dc ppw provided with follow up and rx information provided. Pt gave verbal consent for DC.  With no questions at this time. VS refused at dc. Pt assisted off unit on foot.

## 2021-01-24 NOTE — ED Triage Notes (Signed)
Pt reports left leg pain that starts in her buttocks and goes to her feet for 30 days. Pt states she can't sit due to the pain. Denies any trauma. States she has had xrays and [place].

## 2021-02-03 ENCOUNTER — Other Ambulatory Visit: Payer: Self-pay | Admitting: Orthopedic Surgery

## 2021-02-03 DIAGNOSIS — M5442 Lumbago with sciatica, left side: Secondary | ICD-10-CM

## 2021-02-03 DIAGNOSIS — M5417 Radiculopathy, lumbosacral region: Secondary | ICD-10-CM

## 2021-02-03 DIAGNOSIS — G8929 Other chronic pain: Secondary | ICD-10-CM

## 2021-02-20 ENCOUNTER — Other Ambulatory Visit: Payer: Self-pay

## 2021-02-20 ENCOUNTER — Ambulatory Visit
Admission: RE | Admit: 2021-02-20 | Discharge: 2021-02-20 | Disposition: A | Discharge status: Home / Self Care | Payer: Medicare Other | Source: Ambulatory Visit | Attending: Orthopedic Surgery | Admitting: Orthopedic Surgery

## 2021-02-20 DIAGNOSIS — G8929 Other chronic pain: Secondary | ICD-10-CM | POA: Insufficient documentation

## 2021-02-20 DIAGNOSIS — M5442 Lumbago with sciatica, left side: Secondary | ICD-10-CM | POA: Insufficient documentation

## 2021-02-20 DIAGNOSIS — M5417 Radiculopathy, lumbosacral region: Secondary | ICD-10-CM | POA: Insufficient documentation

## 2021-02-25 ENCOUNTER — Other Ambulatory Visit: Payer: Self-pay | Admitting: Physical Medicine & Rehabilitation

## 2021-02-25 DIAGNOSIS — M5442 Lumbago with sciatica, left side: Secondary | ICD-10-CM

## 2021-02-25 DIAGNOSIS — M48062 Spinal stenosis, lumbar region with neurogenic claudication: Secondary | ICD-10-CM

## 2021-03-11 ENCOUNTER — Ambulatory Visit: Admission: RE | Admit: 2021-03-11 | Payer: Medicare Other | Source: Ambulatory Visit

## 2021-03-27 ENCOUNTER — Ambulatory Visit: Payer: Commercial Managed Care - HMO

## 2021-04-14 ENCOUNTER — Other Ambulatory Visit: Payer: Self-pay

## 2021-04-14 ENCOUNTER — Ambulatory Visit
Admission: RE | Admit: 2021-04-14 | Discharge: 2021-04-14 | Disposition: A | Discharge status: Home / Self Care | Payer: Medicare Other | Source: Ambulatory Visit | Attending: Physical Medicine & Rehabilitation | Admitting: Physical Medicine & Rehabilitation

## 2021-04-14 DIAGNOSIS — M48062 Spinal stenosis, lumbar region with neurogenic claudication: Secondary | ICD-10-CM

## 2021-04-14 DIAGNOSIS — M5442 Lumbago with sciatica, left side: Secondary | ICD-10-CM

## 2021-04-14 MED ORDER — GADOBUTROL 1 MMOL/ML IV SOLN: 7.5000 mL | Freq: Once | INTRAVENOUS | Status: DC | PRN

## 2021-05-21 ENCOUNTER — Ambulatory Visit
Admission: RE | Admit: 2021-05-21 | Discharge: 2021-05-21 | Disposition: A | Discharge status: Home / Self Care | Payer: Medicare Other | Source: Ambulatory Visit | Attending: Physical Medicine & Rehabilitation | Admitting: Physical Medicine & Rehabilitation

## 2021-05-21 ENCOUNTER — Other Ambulatory Visit: Payer: Self-pay

## 2021-05-21 DIAGNOSIS — M48062 Spinal stenosis, lumbar region with neurogenic claudication: Secondary | ICD-10-CM | POA: Diagnosis present

## 2021-05-21 DIAGNOSIS — M5442 Lumbago with sciatica, left side: Secondary | ICD-10-CM | POA: Diagnosis present

## 2021-05-21 MED ORDER — GADOBUTROL 1 MMOL/ML IV SOLN
7.0000 mL | Freq: Once | INTRAVENOUS | Status: AC | PRN
  Administered 2021-05-21: 10:00:00 7 mL via INTRAVENOUS

## 2021-06-26 ENCOUNTER — Other Ambulatory Visit: Payer: Self-pay

## 2021-06-26 ENCOUNTER — Emergency Department
Admission: EM | Admit: 2021-06-26 | Discharge: 2021-06-26 | Disposition: A | Discharge status: Home / Self Care | Payer: Medicare Other | Attending: Emergency Medicine | Admitting: Emergency Medicine

## 2021-06-26 DIAGNOSIS — M5441 Lumbago with sciatica, right side: Secondary | ICD-10-CM | POA: Diagnosis not present

## 2021-06-26 DIAGNOSIS — M5431 Sciatica, right side: Secondary | ICD-10-CM

## 2021-06-26 DIAGNOSIS — I10 Essential (primary) hypertension: Secondary | ICD-10-CM | POA: Diagnosis not present

## 2021-06-26 DIAGNOSIS — E039 Hypothyroidism, unspecified: Secondary | ICD-10-CM | POA: Diagnosis not present

## 2021-06-26 DIAGNOSIS — M545 Low back pain, unspecified: Secondary | ICD-10-CM | POA: Diagnosis present

## 2021-06-26 MED ORDER — TIZANIDINE HCL 2 MG PO TABS
2.0000 mg | ORAL_TABLET | Freq: Once | ORAL | Status: AC
  Administered 2021-06-26: 2 mg via ORAL
  Filled 2021-06-26: qty 1

## 2021-06-26 MED ORDER — KETOROLAC TROMETHAMINE 30 MG/ML IJ SOLN
30.0000 mg | Freq: Once | INTRAMUSCULAR | Status: AC
Start: 2021-06-26 — End: 2021-06-26
  Administered 2021-06-26: 30 mg via INTRAMUSCULAR
  Filled 2021-06-26: qty 1

## 2021-06-26 MED ORDER — OXYCODONE-ACETAMINOPHEN 5-325 MG PO TABS
1.0000 | ORAL_TABLET | Freq: Once | ORAL | Status: AC
  Administered 2021-06-26: 1 via ORAL
  Filled 2021-06-26: qty 1

## 2021-06-26 MED ORDER — TIZANIDINE HCL 2 MG PO TABS: 2.0000 mg | ORAL_TABLET | Freq: Three times a day (TID) | ORAL | 1 refills | Status: DC | PRN

## 2021-06-26 MED ORDER — PREDNISONE 20 MG PO TABS
60.0000 mg | ORAL_TABLET | Freq: Once | ORAL | Status: AC
  Administered 2021-06-26: 60 mg via ORAL
  Filled 2021-06-26: qty 3

## 2021-06-26 MED ORDER — OXYCODONE-ACETAMINOPHEN 5-325 MG PO TABS: 1.0000 | ORAL_TABLET | ORAL | 0 refills | Status: DC | PRN

## 2021-06-26 MED ORDER — PREDNISONE 20 MG PO TABS: 40.0000 mg | ORAL_TABLET | Freq: Every day | ORAL | 0 refills | Status: AC

## 2021-06-26 NOTE — Discharge Instructions (Addendum)
Take the prescription meds as directed.  Follow with primary provider for ongoing symptoms peer return to the ED if needed. ?

## 2021-06-26 NOTE — ED Triage Notes (Addendum)
Pt from home via EMS with c/o exacerbating sciatica that started at 6 am today. Pt c/o severe left leg pain that radiates into lower back, is better laying flat or standing. Pt took muscle relaxer without relief. Pt states the pain is sharp and has periods of numbness when sitting. Pt is AOX4, tearful, ambulatory on arrival.  ?142/88 ?99% RA ?92 ?20 ?History HTN and hypothyroid  ?

## 2021-06-26 NOTE — ED Provider Notes (Signed)
? ? ?[facility] ?Emergency Department Provider Note ? ? ? ? Event Date/Time  ? First MD Initiated Contact with Patient 06/26/21 1332   ?  (approximate) ? ? ?History  ? ?Leg Pain ? ? ?HPI ? ?[name] is a 71 y.o. female with history of hypertension, hypothyroidism, GERD, and significant lumbosacral DDD and sciatica, presents to the ED for evaluation of acute exacerbation of her sciatica.  Patient reports onset today at about 6 AM, without provocation.  She describes severe pain that radiates down the low back into the posterior left leg.  She reports lying flat or standing or prefer to sitting prolonged, which increases her symptoms patient denies any bladder or bowel incontinence, foot drop, or saddle anesthesia.  Patient presents via EMS from home, for evaluation of acute on chronic exacerbation of sciatica.  She is currently under the care of her primary provider, and notes that she has her third of 3 epidural steroid injection scheduled for May.  ? ? ?Physical Exam  ? ?Triage Vital Signs: ?ED Triage Vitals  ?Enc Vitals Group  ?   BP 06/26/21 1312 (!) 198/84  ?   Pulse Rate 06/26/21 1312 92  ?   Resp 06/26/21 1312 20  ?   Temp 06/26/21 1310 98.2 ?F (36.8 ?C)  ?   Temp Source 06/26/21 1310 Oral  ?   SpO2 06/26/21 1312 97 %  ?   Weight 06/26/21 1310 168 lb (76.2 kg)  ?   Height 06/26/21 1310 5\' 5"  (1.651 m)  ?   Head Circumference --   ?   Peak Flow --   ?   Pain Score 06/26/21 1310 10  ?   Pain Loc --   ?   Pain Edu? --   ?   Excl. in [place]? --   ? ? ?Most recent vital signs: ?Vitals:  ? 06/26/21 1312 06/26/21 1330  ?BP: (!) 198/84 (!) 197/91  ?Pulse: 92 83  ?Resp: 20 19  ?Temp:    ?SpO2: 97% 99%  ? ? ?General Awake, no distress.  ?CV:  Good peripheral perfusion. RRR ?RESP:  Normal effort. CTA ?ABD:  No distention.  ?MSK:  Normal spinal alignment without midline tenderness, spasm, deformity, or step-off.  Full active range of motion to lower extremities bilaterally. ?NEURO: Cranial nerves  II through XII grossly intact.  Normal LE DTRs bilaterally.  Normal toe dorsiflexion and foot eversion on exam. ? ? ?ED Results / Procedures / Treatments  ? ?Labs ?(all labs ordered are listed, but only abnormal results are displayed) ?Labs Reviewed - No data to display ? ? ?EKG ? ? ? ?RADIOLOGY ? ? ?No results found. ? ? ?PROCEDURES: ? ?Critical Care performed: No ? ?Procedures ? ? ?MEDICATIONS ORDERED IN ED: ?Medications  ?ketorolac (TORADOL) 30 MG/ML injection 30 mg (30 mg Intramuscular Given 06/26/21 1437)  ?tiZANidine (ZANAFLEX) tablet 2 mg (2 mg Oral Given 06/26/21 1439)  ?oxyCODONE-acetaminophen (PERCOCET/ROXICET) 5-325 MG per tablet 1 tablet (1 tablet Oral Given 06/26/21 1437)  ?predniSONE (DELTASONE) tablet 60 mg (60 mg Oral Given 06/26/21 1437)  ? ? ? ?IMPRESSION / MDM / ASSESSMENT AND PLAN / ED COURSE  ?I reviewed the triage vital signs and the nursing notes. ?             ?               ? ?Differential diagnosis includes, but is not limited to, sciatica exacerbation, radiculopathy, myalgia ? ?Patient presents  to the ED for evaluation and management of his sciatic nerve exacerbation.  Patient's diagnosis is consistent with acute on chronic sciatica. Patient will be discharged home with prescriptions for codeine, prednisone, tizanidine. Patient is to follow up with her neurologist as needed or otherwise directed. Patient is given ED precautions to return to the ED for any worsening or new symptoms. ? ? ?FINAL CLINICAL IMPRESSION(S) / ED DIAGNOSES  ? ?Final diagnoses:  ?Sciatica of right side  ? ? ? ?Rx / DC Orders  ? ?ED Discharge Orders   ? ?      Ordered  ?  oxyCODONE-acetaminophen (PERCOCET) 5-325 MG tablet  Every 4 hours PRN       ? 06/26/21 1454  ?  predniSONE (DELTASONE) 20 MG tablet  Daily with breakfast       ? 06/26/21 1454  ?  tiZANidine (ZANAFLEX) 2 MG tablet  Every 8 hours PRN       ? 06/26/21 1454  ? ?  ?  ? ?  ? ? ? ?Note:  This document was prepared using Dragon voice recognition software and  may include unintentional dictation errors. ? ?  ?Melvenia Needles, PA-C ?06/27/21 1934 ? ?  ?Blake Divine, MD ?06/30/21 1612 ? ?

## 2021-06-28 ENCOUNTER — Encounter: Payer: Self-pay | Admitting: Internal Medicine

## 2021-06-28 ENCOUNTER — Observation Stay: Payer: Medicare Other

## 2021-06-28 ENCOUNTER — Emergency Department: Payer: Medicare Other

## 2021-06-28 ENCOUNTER — Other Ambulatory Visit: Payer: Self-pay

## 2021-06-28 ENCOUNTER — Observation Stay
Admission: EM | Admit: 2021-06-28 | Discharge: 2021-06-29 | Disposition: A | Discharge status: Home / Self Care | Payer: Medicare Other | Attending: Internal Medicine | Admitting: Internal Medicine

## 2021-06-28 DIAGNOSIS — Z79899 Other long term (current) drug therapy: Secondary | ICD-10-CM | POA: Diagnosis not present

## 2021-06-28 DIAGNOSIS — R001 Bradycardia, unspecified: Secondary | ICD-10-CM | POA: Diagnosis present

## 2021-06-28 DIAGNOSIS — Z87891 Personal history of nicotine dependence: Secondary | ICD-10-CM | POA: Insufficient documentation

## 2021-06-28 DIAGNOSIS — I10 Essential (primary) hypertension: Secondary | ICD-10-CM | POA: Diagnosis not present

## 2021-06-28 DIAGNOSIS — T50905A Adverse effect of unspecified drugs, medicaments and biological substances, initial encounter: Secondary | ICD-10-CM

## 2021-06-28 DIAGNOSIS — R42 Dizziness and giddiness: Principal | ICD-10-CM

## 2021-06-28 DIAGNOSIS — E038 Other specified hypothyroidism: Secondary | ICD-10-CM | POA: Diagnosis present

## 2021-06-28 DIAGNOSIS — E039 Hypothyroidism, unspecified: Secondary | ICD-10-CM | POA: Diagnosis not present

## 2021-06-28 DIAGNOSIS — R4182 Altered mental status, unspecified: Secondary | ICD-10-CM | POA: Diagnosis present

## 2021-06-28 LAB — COMPREHENSIVE METABOLIC PANEL
ALT: 9 U/L (ref 0–44)
AST: 18 U/L (ref 15–41)
Albumin: 3.9 g/dL (ref 3.5–5.0)
Alkaline Phosphatase: 61 U/L (ref 38–126)
Anion gap: 9 (ref 5–15)
BUN: 20 mg/dL (ref 8–23)
CO2: 28 mmol/L (ref 22–32)
Calcium: 8.9 mg/dL (ref 8.9–10.3)
Chloride: 103 mmol/L (ref 98–111)
Creatinine, Ser: 0.79 mg/dL (ref 0.44–1.00)
GFR, Estimated: >60 mL/min (ref >60)
Glucose, Bld: 113 mg/dL — ABNORMAL HIGH (ref 70–99)
Potassium: 3.5 mmol/L (ref 3.5–5.1)
Sodium: 140 mmol/L (ref 135–145)
Total Bilirubin: 0.4 mg/dL (ref 0.3–1.2)
Total Protein: 7.2 g/dL (ref 6.5–8.1)

## 2021-06-28 LAB — CBC WITH DIFFERENTIAL/PLATELET
Abs Immature Granulocytes: 0.05 10*3/uL (ref 0.00–0.07)
Basophils Absolute: 0 10*3/uL (ref 0.0–0.1)
Basophils Relative: 0 %
Eosinophils Absolute: 0.1 10*3/uL (ref 0.0–0.5)
Eosinophils Relative: 1 %
HCT: 39 % (ref 36.0–46.0)
Hemoglobin: 11.7 g/dL — ABNORMAL LOW (ref 12.0–15.0)
Immature Granulocytes: 1 %
Lymphocytes Relative: 19 %
Lymphs Abs: 1.8 10*3/uL (ref 0.7–4.0)
MCH: 23.1 pg — ABNORMAL LOW (ref 26.0–34.0)
MCHC: 30 g/dL (ref 30.0–36.0)
MCV: 76.9 fL — ABNORMAL LOW (ref 80.0–100.0)
Monocytes Absolute: 0.7 10*3/uL (ref 0.1–1.0)
Monocytes Relative: 7 %
Neutro Abs: 6.7 10*3/uL (ref 1.7–7.7)
Neutrophils Relative %: 72 %
Platelets: 267 10*3/uL (ref 150–400)
RBC: 5.07 MIL/uL (ref 3.87–5.11)
RDW: 16.2 % — ABNORMAL HIGH (ref 11.5–15.5)
WBC: 9.3 10*3/uL (ref 4.0–10.5)
nRBC: 0 % (ref 0.0–0.2)

## 2021-06-28 LAB — TROPONIN I (HIGH SENSITIVITY)
Troponin I (High Sensitivity): 7 ng/L (ref <18)
Troponin I (High Sensitivity): 6 ng/L (ref <18)

## 2021-06-28 LAB — VITAMIN B12: Vitamin B-12: 429 pg/mL (ref 180–914)

## 2021-06-28 LAB — TSH: TSH: 11.627 u[IU]/mL — ABNORMAL HIGH (ref 0.350–4.500)

## 2021-06-28 LAB — T4, FREE: Free T4: 0.78 ng/dL (ref 0.61–1.12)

## 2021-06-28 MED ORDER — ACETAMINOPHEN 325 MG PO TABS: 650.0000 mg | ORAL_TABLET | Freq: Four times a day (QID) | ORAL | Status: DC | PRN

## 2021-06-28 MED ORDER — ONDANSETRON HCL 4 MG PO TABS: 4.0000 mg | ORAL_TABLET | Freq: Four times a day (QID) | ORAL | Status: DC | PRN

## 2021-06-28 MED ORDER — PREDNISONE 20 MG PO TABS
40.0000 mg | ORAL_TABLET | Freq: Every day | ORAL | Status: DC
  Administered 2021-06-28 – 2021-06-29 (×2): 40 mg via ORAL
  Filled 2021-06-28 (×2): qty 2

## 2021-06-28 MED ORDER — ALBUTEROL SULFATE (2.5 MG/3ML) 0.083% IN NEBU: 2.5000 mg | INHALATION_SOLUTION | RESPIRATORY_TRACT | Status: DC | PRN

## 2021-06-28 MED ORDER — LEVOTHYROXINE SODIUM 88 MCG PO TABS
88.0000 ug | ORAL_TABLET | Freq: Every day | ORAL | Status: DC
  Administered 2021-06-29: 88 ug via ORAL
  Filled 2021-06-28: qty 1

## 2021-06-28 MED ORDER — HYDROCHLOROTHIAZIDE 25 MG PO TABS
25.0000 mg | ORAL_TABLET | Freq: Every day | ORAL | Status: DC
  Administered 2021-06-29: 25 mg via ORAL
  Filled 2021-06-28: qty 1

## 2021-06-28 MED ORDER — CYANOCOBALAMIN 500 MCG PO TABS
500.0000 ug | ORAL_TABLET | Freq: Every day | ORAL | Status: DC
  Administered 2021-06-28 – 2021-06-29 (×2): 500 ug via ORAL
  Filled 2021-06-28 (×3): qty 1

## 2021-06-28 MED ORDER — ENOXAPARIN SODIUM 40 MG/0.4ML IJ SOSY: 40.0000 mg | PREFILLED_SYRINGE | Freq: Every day | INTRAMUSCULAR | Status: DC

## 2021-06-28 MED ORDER — ACETAMINOPHEN 650 MG RE SUPP: 650.0000 mg | Freq: Four times a day (QID) | RECTAL | Status: DC | PRN

## 2021-06-28 MED ORDER — VITAMIN D 25 MCG (1000 UNIT) PO TABS
1000.0000 [IU] | ORAL_TABLET | Freq: Every day | ORAL | Status: DC
  Administered 2021-06-29: 1000 [IU] via ORAL
  Filled 2021-06-28: qty 1

## 2021-06-28 MED ORDER — ONDANSETRON HCL 4 MG/2ML IJ SOLN: 4.0000 mg | Freq: Four times a day (QID) | INTRAMUSCULAR | Status: DC | PRN

## 2021-06-28 MED ORDER — HYDRALAZINE HCL 20 MG/ML IJ SOLN
5.0000 mg | Freq: Four times a day (QID) | INTRAMUSCULAR | Status: DC | PRN
Start: 2021-06-28 — End: 2021-06-29
  Administered 2021-06-28: 5 mg via INTRAVENOUS
  Filled 2021-06-28: qty 1

## 2021-06-28 MED ORDER — MELATONIN 5 MG PO TABS: 5.0000 mg | ORAL_TABLET | Freq: Every evening | ORAL | Status: DC | PRN

## 2021-06-28 MED ORDER — AMLODIPINE BESYLATE 10 MG PO TABS
10.0000 mg | ORAL_TABLET | Freq: Every day | ORAL | Status: DC
  Administered 2021-06-29: 10 mg via ORAL
  Filled 2021-06-28: qty 1

## 2021-06-28 MED ORDER — OXYCODONE-ACETAMINOPHEN 5-325 MG PO TABS
1.0000 | ORAL_TABLET | ORAL | Status: DC | PRN
  Administered 2021-06-29: 1 via ORAL
  Filled 2021-06-28: qty 1

## 2021-06-28 NOTE — Discharge Instructions (Signed)
I think that the dizziness and the dry mouth or sides effects of the Zanaflex medicine that you got the other day.  I want you to stop taking that.  You can throw it out.  If you have any further problems with any of the symptoms do not hesitate to return. ?

## 2021-06-28 NOTE — Assessment & Plan Note (Addendum)
-   Tizanidine can cause low heart rate.  Patient states that she has history of low heart rate in the past.  Follow-up with cardiology as outpatient.  Not symptomatic because of bradycardia. ?

## 2021-06-28 NOTE — Assessment & Plan Note (Addendum)
Likely secondary to tizanidine. ?

## 2021-06-28 NOTE — H&P (Addendum)
History and Physical   [name] WJX:[id number] DOB: 10-27-1950 DOA: 06/28/2021  PCP: [facility]  Outpatient Specialists: Dr. Kirke Corin, [facility] cardiology Patient coming from: Home  I have personally briefly reviewed patient's old medical records in [facility] EMR.  Chief Concern: dizziness  HPI: [name] is a 71 year old female with history of hypertension, chronic pain, neuropathy, hypothyroid, atrial flutter status post ablation via Dr. Graciela Husbands in 2017, mild to moderate mitral regurgitation, previous alcohol use, GERD, who presents to the emergency department for chief concerns of altered mental status.  Initial vitals in the emergency department showed temperature of 97.7, respiration rate of 16, heart rate of 50, blood pressure 155/86, SPO2 of 100% on room air.  Serum sodium is 140, potassium 3.5, chloride of 103, bicarb 26, nonfasting blood glucose 113, BUN of 20, serum creatinine of 0.79, GFR greater than 60, high sensitive troponin was 7, WBC 9.3, hemoglobin 11.7, platelets of 267.  Imaging  Portable chest x-ray: No acute cardiopulmonary disease  MRI of the brain: Ordered by EDP and pending  ED treatment: None.  At bedside she is able to tell me her name, age, current location of hospital, and she was able to identify her friend, Elijah Birk at bedside.  Patient provided and participated in the full HPI process  She reports that at approximately 10 AM this morning, she developed dizziness and double vision.  She states the dizziness is worse with movement.  She denies chest pain, shortness of breath, abdominal pain, dysuria, hematuria, diarrhea, syncope, swelling in her lower extremities.  She denies any changes to her weight.  Social history: She lives at home with her friend Elijah Birk.  She is a former tobacco user and quit in April 2020.  She endorses infrequent EtOH use, last drink she had was yesterday and she had 1 sip of a slushy.  She states she drinks  every 2 to 3 weeks.  She denies recreational drug use.   ROS: Constitutional: no weight change, no fever ENT/Mouth: no sore throat, no rhinorrhea Eyes: no eye pain, no vision changes, + double vision Cardiovascular: no chest pain, no dyspnea,  no edema, no palpitations Respiratory: no cough, no sputum, no wheezing Gastrointestinal: no nausea, no vomiting, no diarrhea, no constipation Genitourinary: no urinary incontinence, no dysuria, no hematuria Musculoskeletal: no arthralgias, no myalgias Skin: no skin lesions, no pruritus, Neuro: + weakness, no loss of consciousness, no syncope, + dizziness Psych: no anxiety, no depression, + decrease appetite Heme/Lymph: no bruising, no bleeding  ED Course: Discussed with emergency medicine provider, patient requiring hospitalization for chief concerns of altered mental status.  Assessment/Plan  Principal Problem:   Altered mental status Active Problems:   Essential hypertension   Subclinical hypothyroidism   Dizziness   Sinus bradycardia   Assessment and Plan:  * Altered mental status Double vision - Etiology work-up in progress - UDS ordered - Follow second troponin, B12, TSH ordered - MRI of the brain without contrast ordered to assess for stroke versus multiple sclerosis - Epic order placed for neurology, a.m. team to recheck to neurologist on-call  Essential hypertension - Resumed home amlodipine 10 mg daily, hydrochlorothiazide 25 mg daily  Sinus bradycardia - Check TSH, UDS - Possible differential include secondary to new tizanidine presumed on 06/26/21. She took tizanidine on AM day of admission - If HR does not improve, recommend patient be evaluated by cardiologist inpatient for symptomatic bradycardia  Dizziness - Work-up in progress, check TSH, B12 - Query vertigo -  MRI of the brain without contrast pending as above  Chart reviewed.   DVT prophylaxis: Enoxaparin Code Status: Full code Diet: Heart healthy Family  Communication: Updated significant other, Tommy at bedside with patient's permission Disposition Plan: Pending clinical course Consults called: None at this time Admission status: Telemetry medical, observation  Past Medical History:  Diagnosis Date   Atrial flutter (HCC) 09/2015   a. CHADS2VASc 2 (HTN, female); b. on Eliquis; c. echo 7/17: EF 50-55%, no RWMA, rhythm atrial flutter, mildly dilated LA/RV, moderately to severely dilated RA, trivial pericardial effusion   Essential hypertension    GERD (gastroesophageal reflux disease)    Subclinical hypothyroidism    Tobacco abuse    Past Surgical History:  Procedure Laterality Date   ABDOMINAL HYSTERECTOMY     ABLATION OF DYSRHYTHMIC FOCUS  01/16/2016   ELECTROPHYSIOLOGIC STUDY N/A 11/11/2015   Procedure: CARDIOVERSION;  Surgeon: Antonieta Iba, MD;  Location: [facility] ORS;  Service: Cardiovascular;  Laterality: N/A;   ELECTROPHYSIOLOGIC STUDY N/A 01/16/2016   Procedure: A-Flutter Ablation;  Surgeon: Duke Salvia, MD;  Location: [facility] INVASIVE CV LAB;  Service: Cardiovascular;  Laterality: N/A;   Social History:  reports that she quit smoking about 5 years ago. She has never used smokeless tobacco. She reports current alcohol use. She reports that she does not use drugs.  Allergies  Allergen Reactions   Lisinopril Swelling    Swelling of lips and face.    Tizanidine Other (See Comments)    bradycardia   Amoxicillin Itching and Rash    Has patient had a PCN reaction causing immediate rash, facial/tongue/throat swelling, SOB or lightheadedness with hypotension: Yes Has patient had a PCN reaction causing severe rash involving mucus membranes or skin necrosis: No Has patient had a PCN reaction that required hospitalization No Has patient had a PCN reaction occurring within the last 10 years: Yes If all of the above answers are "NO", then may proceed with Cephalosporin use.    Family History  Problem Relation Age of Onset   Hypertension  Mother    Diabetes Mother    Diabetes Father    Diabetes Brother    Hypertension Brother    Hypertension Brother    Heart attack Brother    Diabetes Brother    Breast cancer Neg Hx    Family history: Family history reviewed and not pertinent  Prior to Admission medications   Medication Sig Start Date End Date Taking? Authorizing Provider  albuterol (VENTOLIN HFA) 108 (90 Base) MCG/ACT inhaler Inhale 2-4 puffs by mouth every 4 hours as needed for wheezing, cough, and/or shortness of breath 02/24/19  Yes Loleta Rose, MD  amLODipine (NORVASC) 10 MG tablet Take 1 tablet (10 mg total) by mouth daily. 03/24/18 09/04/28 Yes Cuthriell, Delorise Royals, PA-C  ascorbic acid (VITAMIN C) 500 MG tablet Take 500 mg by mouth daily.   Yes [provider]  cholecalciferol (VITAMIN D) 1000 units tablet Take 1,000 Units by mouth daily.   Yes [provider]  diclofenac Sodium (VOLTAREN) 1 % GEL Apply topically. 05/01/21  Yes [provider]  hydrALAZINE (APRESOLINE) 50 MG tablet Take 1 tablet (50 mg total) by mouth in the morning and at bedtime. 12/10/19 06/28/21 Yes Visser, Jacquelyn D, PA-C  hydrochlorothiazide (HYDRODIURIL) 25 MG tablet Take 1 tablet (25 mg total) by mouth daily. 01/09/20 06/28/21 Yes Marisue Ivan D, PA-C  levothyroxine (SYNTHROID, LEVOTHROID) 88 MCG tablet Take 88 mcg by mouth daily before breakfast.  10/24/15 09/04/28 Yes [provider]  oxyCODONE-acetaminophen (PERCOCET) 5-325 MG tablet Take 1 tablet by mouth every 4 (four) hours as needed for severe pain. 06/26/21 06/26/22 Yes Menshew, Charlesetta Ivory, PA-C  predniSONE (DELTASONE) 20 MG tablet Take 2 tablets (40 mg total) by mouth daily with breakfast for 5 days. 06/26/21 07/01/21 Yes Menshew, Charlesetta Ivory, PA-C  tiZANidine (ZANAFLEX) 2 MG tablet Take 1 tablet (2 mg total) by mouth every 8 (eight) hours as needed for up to 20 days for muscle spasms. 06/26/21 07/16/21 Yes Menshew, Charlesetta Ivory, PA-C  vitamin  B-12 (CYANOCOBALAMIN) 500 MCG tablet Take 500 mcg by mouth daily.   Yes [provider]  acetaminophen (TYLENOL) 500 MG tablet Take 500 mg by mouth every 6 (six) hours as needed for moderate pain or headache.    [provider]  celecoxib (CELEBREX) 100 MG capsule Take 100 mg by mouth 2 (two) times daily as needed. 06/23/21   [provider]  cyclobenzaprine (FLEXERIL) 10 MG tablet Take 10 mg by mouth 2 (two) times daily as needed. 06/11/21   [provider]  cyclobenzaprine (FLEXERIL) 5 MG tablet 1 tablet every 8 hours as he did for muscle spasms 07/14/20   Irean Hong, MD  pregabalin (LYRICA) 150 MG capsule Take 150 mg by mouth 3 (three) times daily. Patient not taking: Reported on 06/28/2021 01/21/21   [provider]   Physical Exam: Vitals:   06/28/21 1413 06/28/21 1535 06/28/21 1700 06/28/21 1910  BP: (!) 155/86 138/66 134/78 132/73  Pulse: (!) 50 (!) 52 (!) 55 64  Resp: 16 14 15 14   Temp: 97.7 F (36.5 C) 98.2 F (36.8 C)  98.1 F (36.7 C)  TempSrc: Oral Oral  Oral  SpO2: 100% 96% 100% 95%   Constitutional: appears age-appropriate, NAD, calm, comfortable Eyes: PERRL, lids and conjunctivae normal ENMT: Mucous membranes are moist. Posterior pharynx clear of any exudate or lesions.  Poor dentition. Hearing appropriate Neck: normal, supple, no masses, no thyromegaly Respiratory: clear to auscultation bilaterally, no wheezing, no crackles. Normal respiratory effort. No accessory muscle use.  Cardiovascular: Regular rate and rhythm, no murmurs / rubs / gallops. No extremity edema. 2+ pedal pulses. No carotid bruits.  Abdomen: no tenderness, no masses palpated, no hepatosplenomegaly. Bowel sounds positive.  Musculoskeletal: no clubbing / cyanosis. No joint deformity upper and lower extremities. Good ROM, no contractures, no atrophy. Normal muscle tone.  Skin: no rashes, lesions, ulcers. No induration Neurologic: Sensation intact. Strength 5/5 in  all 4.  Psychiatric: Normal judgment and insight. Alert and oriented x 3. Normal mood.   EKG: independently reviewed, showing sinus bradycardia with rate of 52, QTc 411, LVH  Chest x-ray on Admission: I personally reviewed and I agree with radiologist reading as below.  MR BRAIN WO CONTRAST  Result Date: 06/28/2021 CLINICAL DATA:  Dizziness, persistent/recurrent. Cardiac or vascular cause suspected. Dizziness and dry mouth after starting new medication a few weeks ago. EXAM: MRI HEAD WITHOUT CONTRAST TECHNIQUE: Multiplanar, multiecho pulse sequences of the brain and surrounding structures were obtained without intravenous contrast. COMPARISON:  None. FINDINGS: Brain: No acute infarct, hemorrhage, or mass lesion is present. Subcortical T2 hyperintensities are most prominent within the parietal lobes bilaterally. These are mildly advanced for age. The ventricles are of normal size. No significant extraaxial fluid collection is present. Internal auditory canals are within normal limits bilaterally. 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 Brainstem and cerebellum otherwise normal. Vascular: Flow is present in the major intracranial arteries. Skull  and upper cervical spine: The craniocervical junction is normal. Upper cervical spine is within normal limits. Marrow signal is unremarkable. Sinuses/Orbits: The paranasal sinuses and mastoid air cells are clear. The globes and orbits are within normal limits. IMPRESSION: 1. No acute intracranial abnormality. 2. Subcortical T2 hyperintensities bilaterally are mildly advanced for age. The finding is nonspecific but can be seen in the setting of chronic microvascular ischemia, a demyelinating process such as multiple sclerosis, vasculitis, complicated migraine headaches, or as the sequelae of a prior infectious or inflammatory process. Electronically Signed   By: Marin Roberts M.D.   On: 06/28/2021 18:06   DG Chest Portable 1 View  Result Date:  06/28/2021 CLINICAL DATA:  Weakness.  Dizziness. EXAM: PORTABLE CHEST 1 VIEW COMPARISON:  09/10/2020. FINDINGS: Cardiac silhouette is borderline enlarged. No mediastinal or hilar masses. Lungs are hyperexpanded, but clear. No pleural effusion or pneumothorax. Skeletal structures are grossly intact. IMPRESSION: No acute cardiopulmonary disease. Electronically Signed   By: Amie Portland M.D.   On: 06/28/2021 14:28    Labs on Admission: I have personally reviewed following labs  CBC: Recent Labs  Lab 06/28/21 1412  WBC 9.3  NEUTROABS 6.7  HGB 11.7*  HCT 39.0  MCV 76.9*  PLT 267   Basic Metabolic Panel: Recent Labs  Lab 06/28/21 1412  NA 140  K 3.5  CL 103  CO2 28  GLUCOSE 113*  BUN 20  CREATININE 0.79  CALCIUM 8.9   GFR: Estimated Creatinine Clearance: 68.7 mL/min (by C-G formula based on SCr of 0.79 mg/dL).  Liver Function Tests: Recent Labs  Lab 06/28/21 1412  AST 18  ALT 9  ALKPHOS 61  BILITOT 0.4  PROT 7.2  ALBUMIN 3.9   Urine analysis:    Component Value Date/Time   COLORURINE YELLOW (A) 09/10/2020 2108   APPEARANCEUR CLEAR (A) 09/10/2020 2108   LABSPEC 1.013 09/10/2020 2108   PHURINE 5.0 09/10/2020 2108   GLUCOSEU NEGATIVE 09/10/2020 2108   HGBUR NEGATIVE 09/10/2020 2108   BILIRUBINUR NEGATIVE 09/10/2020 2108   KETONESUR NEGATIVE 09/10/2020 2108   PROTEINUR NEGATIVE 09/10/2020 2108   NITRITE NEGATIVE 09/10/2020 2108   LEUKOCYTESUR LARGE (A) 09/10/2020 2108   Dr. Sedalia Muta [facility]  If 7PM-7AM, please contact overnight-coverage provider If 7AM-7PM, please contact day coverage provider [email or web address]  06/28/2021, 7:35 PM

## 2021-06-28 NOTE — Hospital Course (Addendum)
[name] is a 71 year old female with history of hypertension, chronic pain, neuropathy, hypothyroid, atrial flutter status post ablation via Dr. Caryl Comes in 2017, mild to moderate mitral regurgitation, previous alcohol use, GERD, who presents to the emergency department for chief concerns of altered mental status. ? ?Initial vitals in the emergency department showed temperature of 97.7, respiration rate of 16, heart rate of 50, blood pressure 155/86, SPO2 of 100% on room air. ? ?Serum sodium is 140, potassium 3.5, chloride of 103, bicarb 26, nonfasting blood glucose 113, BUN of 20, serum creatinine of 0.79, GFR greater than 60, high sensitive troponin was 7, WBC 9.3, hemoglobin 11.7, platelets of 267. ? ?Imaging ? ?Portable chest x-ray: No acute cardiopulmonary disease ? ?MRI of the brain: Ordered by EDP and pending ? ?ED treatment: None. ?

## 2021-06-28 NOTE — ED Provider Notes (Signed)
? ?[facility] ?Provider Note ? ? ? None  ?  (approximate) ? ? ?History  ? ?Dizziness ? ? ?HPI ? ?[name] is a 71 y.o. female with a past history of hypertension low thyroid reflux and lumbosacral problems who was seen yesterday for worsening sciatica and placed on oxycodone prednisone and Zanaflex.  Today she noted her mouth was really dry and she was feeling a little unusual (almost high.) and also somewhat lightheaded.  EMS was called and got there and found that she was bradycardic with a rate of about 49 ? ?  ? ? ?Physical Exam  ? ?Triage Vital Signs: ?ED Triage Vitals  ?Enc Vitals Group  ?   BP   ?   Pulse   ?   Resp   ?   Temp   ?   Temp src   ?   SpO2   ?   Weight   ?   Height   ?   Head Circumference   ?   Peak Flow   ?   Pain Score   ?   Pain Loc   ?   Pain Edu?   ?   Excl. in GC?   ? ? ?Most recent vital signs: ?Vitals:  ? 06/28/21 1413  ?BP: (!) 155/86  ?Pulse: (!) 50  ?Resp: 16  ?Temp: 97.7 ?F (36.5 ?C)  ?SpO2: 100%  ? ? ?General: Awake, no distress.  ?CV:  Good peripheral perfusion.  Heart regular rate and rhythm bradycardia ?Resp:  Normal effort.  Lungs are clear ?Abd:  No distention.  Soft and nontender ?Extremities with no edema ? ? ?ED Results / Procedures / Treatments  ? ?Labs ?(all labs ordered are listed, but only abnormal results are displayed) ?Labs Reviewed  ?COMPREHENSIVE METABOLIC PANEL - Abnormal; Notable for the following components:  ?    Result Value  ? Glucose, Bld 113 (*)   ? All other components within normal limits  ?CBC WITH DIFFERENTIAL/PLATELET - Abnormal; Notable for the following components:  ? Hemoglobin 11.7 (*)   ? MCV 76.9 (*)   ? MCH 23.1 (*)   ? RDW 16.2 (*)   ? All other components within normal limits  ?TROPONIN I (HIGH SENSITIVITY)  ? ? ? ?EKG ? ?EKG from EMS read and interpreted by me shows normal axis flipped T's in the lateral chest leads some ST segment depression in lead III some irregular irregular baseline in lead I no obvious acute  changes from prior EKGs EKG done here also read interpreted by me shows sinus bradycardia at 52 normal axis very similar to EKG from EMS and similar old EKGs. ? ? ?RADIOLOGY ?MRI pending ? ? ?PROCEDURES: ? ?Critical Care performed:  ? ?Procedures ? ? ?MEDICATIONS ORDERED IN ED: ?Medications - No data to display ? ? ?IMPRESSION / MDM / ASSESSMENT AND PLAN / ED COURSE  ?I reviewed the triage vital signs and the nursing notes. ?----------------------------------------- ?3:27 PM on 06/28/2021 ?----------------------------------------- ?Patient reports she still dizzy feeling and when she looks at me I look a little wavy.  I think that these are side effects of the Zanaflex.  However just to make sure we have gotten an EKG which looks okay except for bradycardia and the troponins.  I am also going to get an MRI of her head.  I will notify the hospitalist just in case the symptoms do not resolve after the MRI I believe we should get her in the  hospital.  I Minna sign her out however. ? ?The patient is on the cardiac monitor to evaluate for evidence of arrhythmia and/or significant heart rate changes.  None have been seen as of 330 ? ? ? ?FINAL CLINICAL IMPRESSION(S) / ED DIAGNOSES  ? ?Final diagnoses:  ?Dizziness  ? ? ? ?Rx / DC Orders  ? ?ED Discharge Orders   ? ? None  ? ?  ? ? ? ?Note:  This document was prepared using Dragon voice recognition software and may include unintentional dictation errors. ?  ?Arnaldo Natal, MD ?06/28/21 1528 ? ?

## 2021-06-28 NOTE — Assessment & Plan Note (Addendum)
Resumed home amlodipine 10 mg daily, hydrochlorothiazide 25 mg daily and hydralazine. ?

## 2021-06-28 NOTE — ED Notes (Addendum)
Patient transported to MRI 

## 2021-06-28 NOTE — Assessment & Plan Note (Addendum)
Resolved

## 2021-06-28 NOTE — ED Triage Notes (Signed)
Pt arrives today with chief complaint of dizziness and dry mouth after starting a new muscle relaxer a few weeks ago. When EMS arrived pts hr was in 40's with other vss. Pt arrives alert and oriented. EMS placed 20 G in LAC ?

## 2021-06-29 DIAGNOSIS — T50905S Adverse effect of unspecified drugs, medicaments and biological substances, sequela: Secondary | ICD-10-CM | POA: Diagnosis not present

## 2021-06-29 DIAGNOSIS — I10 Essential (primary) hypertension: Secondary | ICD-10-CM | POA: Diagnosis not present

## 2021-06-29 DIAGNOSIS — E039 Hypothyroidism, unspecified: Secondary | ICD-10-CM | POA: Diagnosis not present

## 2021-06-29 DIAGNOSIS — T50905A Adverse effect of unspecified drugs, medicaments and biological substances, initial encounter: Secondary | ICD-10-CM

## 2021-06-29 DIAGNOSIS — R4182 Altered mental status, unspecified: Secondary | ICD-10-CM | POA: Diagnosis not present

## 2021-06-29 DIAGNOSIS — R42 Dizziness and giddiness: Secondary | ICD-10-CM | POA: Diagnosis not present

## 2021-06-29 DIAGNOSIS — R001 Bradycardia, unspecified: Secondary | ICD-10-CM | POA: Diagnosis not present

## 2021-06-29 LAB — HIV ANTIBODY (ROUTINE TESTING W REFLEX): HIV Screen 4th Generation wRfx: NONREACTIVE

## 2021-06-29 MED ORDER — LEVOTHYROXINE SODIUM 100 MCG PO TABS: 100.0000 ug | ORAL_TABLET | Freq: Every day | ORAL | Status: DC

## 2021-06-29 NOTE — Assessment & Plan Note (Signed)
Can consider increasing dose of levothyroxine as outpatient.  TSH 11.627 but free T4 is normal range at 0.78. ?

## 2021-06-29 NOTE — Care Management (Signed)
?  Transition of Care (TOC) Screening Note ? ? ?Patient Details  ?Name: [name] ?Date of Birth: [birth year] ? ? ?Transition of Care (TOC) CM/SW Contact:    ?Caryn Section, RN ?Phone Number: ?06/29/2021, 12:59 PM ? ? ? ?Transition of Care Department [facility]) has reviewed patient and no TOC needs have been identified at this time. We will continue to monitor patient advancement through interdisciplinary progression rounds. If new patient transition needs arise, please place a TOC consult. ?  ?

## 2021-06-29 NOTE — Assessment & Plan Note (Signed)
I believe a lot of the patient's symptoms were secondary to tizanidine.  This medication can cause blurred vision dizziness and also bradycardia.  Patient feels better after stopping this medication.  Will discharge home in stable condition. ?

## 2021-06-29 NOTE — Discharge Summary (Signed)
?Physician Discharge Summary ?  ?Patient: [name] MRN: [id number] DOB: 01/19/1951  ?Admit date:     06/28/2021  ?Discharge date: 06/29/21  ?Discharge Physician: Alford Highland  ? ?PCP: [facility]  ? ?Recommendations at discharge:  ? ?Follow-up PCP 5 days ? ?Discharge Diagnoses: ?Principal Problem: ?  Adverse drug reaction ?Active Problems: ?  Essential hypertension ?  Hypothyroidism, unspecified ?  Altered mental status ?  Dizziness ?  Sinus bradycardia ? ? ?Hospital Course: ?[name] is a 71 year old female with history of hypertension, chronic pain, neuropathy, hypothyroid, atrial flutter status post ablation via Dr. Graciela Husbands in 2017, mild to moderate mitral regurgitation, previous alcohol use, GERD, who presents to the emergency department for chief concerns of altered mental status. ? ?Initial vitals in the emergency department showed temperature of 97.7, respiration rate of 16, heart rate of 50, blood pressure 155/86, SPO2 of 100% on room air. ? ?Serum sodium is 140, potassium 3.5, chloride of 103, bicarb 26, nonfasting blood glucose 113, BUN of 20, serum creatinine of 0.79, GFR greater than 60, high sensitive troponin was 7, WBC 9.3, hemoglobin 11.7, platelets of 267. ? ?Imaging ? ?Portable chest x-ray: No acute cardiopulmonary disease ? ?MRI of the brain: Reviewed with neurologist that likely shows chronic microvascular ischemic changes. ? ?Patient feeling better less dizzy and not having the blurry vision today.  Patient felt well enough to go home.  I advised her to check her blood pressure at home. ? ? ?Assessment and Plan: ?* Adverse drug reaction ?I believe a lot of the patient's symptoms were secondary to tizanidine.  This medication can cause blurred vision dizziness and also bradycardia.  Patient feels better after stopping this medication.  Will discharge home in stable condition. ? ?Essential hypertension ?Resumed home amlodipine 10 mg daily, hydrochlorothiazide 25 mg  daily and hydralazine. ? ?Sinus bradycardia ?- Tizanidine can cause low heart rate.  Patient states that she has history of low heart rate in the past.  Follow-up with cardiology as outpatient.  Not symptomatic because of bradycardia. ? ?Dizziness ?Likely secondary to tizanidine. ? ?Altered mental status ?Resolved ? ?Hypothyroidism, unspecified ?Can consider increasing dose of levothyroxine as outpatient.  TSH 11.627 but free T4 is normal range at 0.78. ? ? ? ? ?  ? ? ?Consultants: Neurology ?Procedures performed: None ?Disposition: Home ?Diet recommendation:  ?Cardiac diet ?DISCHARGE MEDICATION: ?Allergies as of 06/29/2021   ? ?   Reactions  ? Lisinopril Swelling  ? Swelling of lips and face.   ? Tizanidine Other (See Comments)  ? bradycardia  ? Amoxicillin Itching, Rash  ? Has patient had a PCN reaction causing immediate rash, facial/tongue/throat swelling, SOB or lightheadedness with hypotension: Yes ?Has patient had a PCN reaction causing severe rash involving mucus membranes or skin necrosis: No ?Has patient had a PCN reaction that required hospitalization No ?Has patient had a PCN reaction occurring within the last 10 years: Yes ?If all of the above answers are "NO", then may proceed with Cephalosporin use.  ? ?  ? ?  ?Medication List  ?  ? ?STOP taking these medications   ? ?cyclobenzaprine 10 MG tablet ?Commonly known as: FLEXERIL ?  ?cyclobenzaprine 5 MG tablet ?Commonly known as: FLEXERIL ?  ?pregabalin 150 MG capsule ?Commonly known as: LYRICA ?  ?tiZANidine 2 MG tablet ?Commonly known as: ZANAFLEX ?  ? ?  ? ?TAKE these medications   ? ?acetaminophen 500 MG tablet ?Commonly known as: TYLENOL ?Take 500 mg by  mouth every 6 (six) hours as needed for moderate pain or headache. ?  ?albuterol 108 (90 Base) MCG/ACT inhaler ?Commonly known as: VENTOLIN HFA ?Inhale 2-4 puffs by mouth every 4 hours as needed for wheezing, cough, and/or shortness of breath ?  ?amLODipine 10 MG tablet ?Commonly known as: NORVASC ?Take  1 tablet (10 mg total) by mouth daily. ?  ?ascorbic acid 500 MG tablet ?Commonly known as: VITAMIN C ?Take 500 mg by mouth daily. ?  ?celecoxib 100 MG capsule ?Commonly known as: CELEBREX ?Take 100 mg by mouth 2 (two) times daily as needed. ?  ?cholecalciferol 1000 units tablet ?Commonly known as: VITAMIN D ?Take 1,000 Units by mouth daily. ?  ?diclofenac Sodium 1 % Gel ?Commonly known as: VOLTAREN ?Apply topically. ?  ?hydrALAZINE 50 MG tablet ?Commonly known as: APRESOLINE ?Take 1 tablet (50 mg total) by mouth in the morning and at bedtime. ?  ?hydrochlorothiazide 25 MG tablet ?Commonly known as: HYDRODIURIL ?Take 1 tablet (25 mg total) by mouth daily. ?  ?levothyroxine 88 MCG tablet ?Commonly known as: SYNTHROID ?Take 88 mcg by mouth daily before breakfast. ?  ?oxyCODONE-acetaminophen 5-325 MG tablet ?Commonly known as: Percocet ?Take 1 tablet by mouth every 4 (four) hours as needed for severe pain. ?  ?predniSONE 20 MG tablet ?Commonly known as: DELTASONE ?Take 2 tablets (40 mg total) by mouth daily with breakfast for 5 days. ?  ?vitamin B-12 500 MCG tablet ?Commonly known as: CYANOCOBALAMIN ?Take 500 mcg by mouth daily. ?  ? ?  ? ? Follow-up Information   ? ? [facility] Follow up in 5 day(s).   ?Contact information: ?[address] ?[place] [address] ?[phone] ? ? ?  ?  ? ? Iran Ouch, MD Follow up in 2 week(s).   ?Specialty: Cardiology ?Contact information: ?[address] ?[address] ?[place] [address] ?[phone] ? ? ?  ?  ? ?  ?  ? ?  ? ?Discharge Exam: ?Filed Weights  ? 06/28/21 2047  ?Weight: 76.5 kg  ? ?Physical Exam ?HENT:  ?   Head: Normocephalic.  ?   Mouth/Throat:  ?   Pharynx: No oropharyngeal exudate.  ?Eyes:  ?   General: Lids are normal.  ?   Conjunctiva/sclera: Conjunctivae normal.  ?Cardiovascular:  ?   Rate and Rhythm: Normal rate and regular rhythm.  ?   Heart sounds: Normal heart sounds, S1 normal and S2 normal.  ?Pulmonary:  ?   Breath sounds: No  decreased breath sounds, wheezing, rhonchi or rales.  ?Abdominal:  ?   Palpations: Abdomen is soft.  ?   Tenderness: There is no abdominal tenderness.  ?Musculoskeletal:  ?   Right lower leg: No swelling.  ?   Left lower leg: No swelling.  ?Skin: ?   General: Skin is warm.  ?   Findings: No rash.  ?Neurological:  ?   Mental Status: She is alert and oriented to person, place, and time.  ?  ? ?Condition at discharge: Satisfactory ? ?The results of significant diagnostics from this hospitalization (including imaging, microbiology, ancillary and laboratory) are listed below for reference.  ? ?Imaging Studies: ?MR BRAIN WO CONTRAST ? ?Result Date: 06/28/2021 ?CLINICAL DATA:  Dizziness, persistent/recurrent. Cardiac or vascular cause suspected. Dizziness and dry mouth after starting new medication a few weeks ago. EXAM: MRI HEAD WITHOUT CONTRAST TECHNIQUE: Multiplanar, multiecho pulse sequences of the brain and surrounding structures were obtained without intravenous contrast. COMPARISON:  None. FINDINGS: Brain: No  acute infarct, hemorrhage, or mass lesion is present. Subcortical T2 hyperintensities are most prominent within the parietal lobes bilaterally. These are mildly advanced for age. The ventricles are of normal size. No significant extraaxial fluid collection is present. Internal auditory canals are within normal limits bilaterally. 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 Brainstem and cerebellum otherwise normal. Vascular: Flow is present in the major intracranial arteries. Skull and upper cervical spine: The craniocervical junction is normal. Upper cervical spine is within normal limits. Marrow signal is unremarkable. Sinuses/Orbits: The paranasal sinuses and mastoid air cells are clear. The globes and orbits are within normal limits. IMPRESSION: 1. No acute intracranial abnormality. 2. Subcortical T2 hyperintensities bilaterally are mildly advanced for age. The finding is nonspecific but can be seen  in the setting of chronic microvascular ischemia, a demyelinating process such as multiple sclerosis, vasculitis, complicated migraine headaches, or as the sequelae of a prior infectious or inflammato

## 2021-06-29 NOTE — Progress Notes (Signed)
Pt being discharged home, discharge instructions reviewed with pt and son, states understanding, pt with no complaints 

## 2021-06-29 NOTE — Consult Note (Signed)
?                    NEURO HOSPITALIST CONSULT NOTE  ? ?Requestig physician: Dr. Renae GlossWieting ? ?Reason for Consult: Episode of dizziness and double vision ? ?History obtained from:  Patient and Chart    ? ?HPI:                                                                                                                                         ? [name] is an 71 y.o. female with a PMHx of atrial flutter s/p ablation in 2017 (on Eliquis), HTN, chronic pain, neuropathy, tobacco abuse and subclinical hypothyroidism who presented yesterday evening with complaints of double vision and altered mental status. In the ED she was afebrile with BP of 155/86. Electrolytes were unremarkable. TSH was elevated at 11.6; T4 normal at 0.78. Vitamin B12 normal at 429. Urine toxicology screen was negative. ? ?The patient was seen in the ED on Friday and given a new prescription for tizanidine. Her symptoms occurred on Sunday AM after taking a total of 3 doses. She also had been prescribed an opiate medication but states that she did not take it on Sunday.   ? ?HPI from admitting Hospitalist's note was reviewed, including the following: "She reports that at approximately 10 AM this morning, she developed dizziness and double vision.  She states the dizziness is worse with movement.  She denies chest pain, shortness of breath, abdominal pain, dysuria, hematuria, diarrhea, syncope, swelling in her lower extremities." ? ?MRI brain revealed nonspecific chronic-appearing T2 hyperintensities in the deep white matter of the parietal lobes.  ? ?Past Medical History:  ?Diagnosis Date  ? Atrial flutter (HCC) 09/2015  ? a. CHADS2VASc 2 (HTN, female); b. on Eliquis; c. echo 7/17: EF 50-55%, no RWMA, rhythm atrial flutter, mildly dilated LA/RV, moderately to severely dilated RA, trivial pericardial effusion  ? Essential hypertension   ? GERD (gastroesophageal reflux disease)   ? Subclinical hypothyroidism   ? Tobacco abuse   ? ? ?Past Surgical  History:  ?Procedure Laterality Date  ? ABDOMINAL HYSTERECTOMY    ? ABLATION OF DYSRHYTHMIC FOCUS  01/16/2016  ? ELECTROPHYSIOLOGIC STUDY N/A 11/11/2015  ? Procedure: CARDIOVERSION;  Surgeon: Antonieta Ibaimothy J Gollan, MD;  Location: [facility] ORS;  Service: Cardiovascular;  Laterality: N/A;  ? ELECTROPHYSIOLOGIC STUDY N/A 01/16/2016  ? Procedure: A-Flutter Ablation;  Surgeon: Duke SalviaSteven C Klein, MD;  [facility];  Service: Cardiovascular;  Laterality: N/A;  ? ? ?Family History  ?Problem Relation Age of Onset  ? Hypertension Mother   ? Diabetes Mother   ? Diabetes Father   ? Diabetes Brother   ? Hypertension Brother   ? Hypertension Brother   ? Heart attack Brother   ? Diabetes Brother   ? Breast cancer Neg Hx   ?        ? ?  Social History:  reports that she quit smoking about 5 years ago. She has never used smokeless tobacco. She reports current alcohol use. She reports that she does not use drugs. ? ?Allergies  ?Allergen Reactions  ? Lisinopril Swelling  ?  Swelling of lips and face.   ? Tizanidine Other (See Comments)  ?  bradycardia  ? Amoxicillin Itching and Rash  ?  Has patient had a PCN reaction causing immediate rash, facial/tongue/throat swelling, SOB or lightheadedness with hypotension: Yes ?Has patient had a PCN reaction causing severe rash involving mucus membranes or skin necrosis: No ?Has patient had a PCN reaction that required hospitalization No ?Has patient had a PCN reaction occurring within the last 10 years: Yes ?If all of the above answers are "NO", then may proceed with Cephalosporin use. ?  ? ? ?MEDICATIONS:                                                                                                                     ?Prior to Admission:  ?Medications Prior to Admission  ?Medication Sig Dispense Refill Last Dose  ? albuterol (VENTOLIN HFA) 108 (90 Base) MCG/ACT inhaler Inhale 2-4 puffs by mouth every 4 hours as needed for wheezing, cough, and/or shortness of breath 8 g 1 Past Week  ?  amLODipine (NORVASC) 10 MG tablet Take 1 tablet (10 mg total) by mouth daily. 30 tablet 11 06/28/2021  ? ascorbic acid (VITAMIN C) 500 MG tablet Take 500 mg by mouth daily.   Past Month  ? cholecalciferol (VITAMIN D) 1000 units tablet Take 1,000 Units by mouth daily.   06/28/2021  ? diclofenac Sodium (VOLTAREN) 1 % GEL Apply topically.   06/28/2021  ? hydrALAZINE (APRESOLINE) 50 MG tablet Take 1 tablet (50 mg total) by mouth in the morning and at bedtime. 60 tablet 3 06/28/2021  ? hydrochlorothiazide (HYDRODIURIL) 25 MG tablet Take 1 tablet (25 mg total) by mouth daily. 90 tablet 3 06/27/2021  ? levothyroxine (SYNTHROID, LEVOTHROID) 88 MCG tablet Take 88 mcg by mouth daily before breakfast.    06/28/2021  ? oxyCODONE-acetaminophen (PERCOCET) 5-325 MG tablet Take 1 tablet by mouth every 4 (four) hours as needed for severe pain. 20 tablet 0 06/28/2021  ? predniSONE (DELTASONE) 20 MG tablet Take 2 tablets (40 mg total) by mouth daily with breakfast for 5 days. 10 tablet 0 06/27/2021  ? tiZANidine (ZANAFLEX) 2 MG tablet Take 1 tablet (2 mg total) by mouth every 8 (eight) hours as needed for up to 20 days for muscle spasms. 30 tablet 1 06/28/2021  ? vitamin B-12 (CYANOCOBALAMIN) 500 MCG tablet Take 500 mcg by mouth daily.   06/26/2021  ? acetaminophen (TYLENOL) 500 MG tablet Take 500 mg by mouth every 6 (six) hours as needed for moderate pain or headache.   06/26/2021  ? celecoxib (CELEBREX) 100 MG capsule Take 100 mg by mouth 2 (two) times daily as needed.   06/24/2021  ? cyclobenzaprine (FLEXERIL) 10 MG tablet Take 10 mg by mouth  2 (two) times daily as needed.   06/26/2021  ? cyclobenzaprine (FLEXERIL) 5 MG tablet 1 tablet every 8 hours as he did for muscle spasms 15 tablet 0 06/26/2021  ? pregabalin (LYRICA) 150 MG capsule Take 150 mg by mouth 3 (three) times daily. (Patient not taking: Reported on 06/28/2021)   Not Taking  ? ?Scheduled: ? amLODipine  10 mg Oral Daily  ? cholecalciferol  1,000 Units Oral Daily  ? enoxaparin  (LOVENOX) injection  40 mg Subcutaneous QHS  ? hydrochlorothiazide  25 mg Oral Daily  ? [START ON 06/30/2021] levothyroxine  100 mcg Oral Q0600  ? predniSONE  40 mg Oral Q breakfast  ? vitamin B-12  500 mcg Oral Daily  ? ? ? ? ?ROS:                                                                                                                                       ?Dizziness resolved yesterday. No current complaints.  ? ? ?Blood pressure 113/88, pulse (!) 52, temperature 97.7 ?F (36.5 ?C), temperature source Oral, resp. rate 17, height 5\' 5"  (1.651 m), weight 76.5 kg, SpO2 99 %. ? ? ?General Examination:                                                                                                      ? ?Physical Exam  ?HEENT-  [place]/AT    ?Lungs- Respirations unlabored ?Extremities- No edema ? ?Neurological Examination ?Mental Status: Alert, oriented, thought content appropriate.  Speech fluent without evidence of aphasia.  Able to follow all commands without difficulty. ?Cranial Nerves: ?II: Temporal visual fields intact with no extinction to DSS. PERRL.   ?III,IV, VI: No ptosis. EOMI. No nystagmus. No double vision elicited.  ?V: FT intact bilaterally ?VII: Smile symmetric ?VIII: Hearing intact to voice ?IX,X: No  hypophonia or hoarseness ?XI: Symmetric shoulder shrug ?XII: Midline tongue extension ?Motor: ?BUE: 5/5 ?RLE 5/5 proximally and distally ?LLE with guarding due to sciatica pain; in this context HF is 4+/5. KE and ADF 5/5 ?Sensory: Light touch intact throughout, bilaterally. No extinction to DSS.  ?Deep Tendon Reflexes: 2+ and symmetric biceps, brachioradialis and patellae. 1+ bilateral achilles. Toes are downgoing.  ?Cerebellar: No ataxia with FNF bilaterally  ?Gait: Deferred ?  ?Lab Results: ?Basic Metabolic Panel: ?Recent Labs  ?Lab 06/28/21 ?1412  ?NA 140  ?K 3.5  ?CL 103  ?CO2 28  ?GLUCOSE 113*  ?BUN 20  ?CREATININE 0.79  ?CALCIUM 8.9  ? ? ?  CBC: ?Recent Labs  ?Lab 06/28/21 ?1412  ?WBC 9.3  ?NEUTROABS  6.7  ?HGB 11.7*  ?HCT 39.0  ?MCV 76.9*  ?PLT 267  ? ? ?Cardiac Enzymes: ?No results for input(s): CKTOTAL, CKMB, CKMBINDEX, TROPONINI in the last 168 hours. ? ?Lipid Panel: ?No results for input(s): CHOL, TRIG, HDL,

## 2021-06-30 DIAGNOSIS — M5430 Sciatica, unspecified side: Secondary | ICD-10-CM | POA: Insufficient documentation

## 2021-09-15 ENCOUNTER — Other Ambulatory Visit: Payer: Self-pay

## 2021-09-15 ENCOUNTER — Emergency Department
Admission: EM | Admit: 2021-09-15 | Discharge: 2021-09-15 | Disposition: A | Discharge status: Home / Self Care | Payer: Medicare Other | Attending: Emergency Medicine | Admitting: Emergency Medicine

## 2021-09-15 ENCOUNTER — Encounter: Payer: Self-pay | Admitting: Emergency Medicine

## 2021-09-15 DIAGNOSIS — H109 Unspecified conjunctivitis: Secondary | ICD-10-CM | POA: Diagnosis not present

## 2021-09-15 DIAGNOSIS — H5789 Other specified disorders of eye and adnexa: Secondary | ICD-10-CM | POA: Diagnosis present

## 2021-09-15 MED ORDER — TETRACAINE HCL 0.5 % OP SOLN
1.0000 [drp] | Freq: Once | OPHTHALMIC | Status: AC
  Administered 2021-09-15: 1 [drp] via OPHTHALMIC
  Filled 2021-09-15: qty 4

## 2021-09-15 MED ORDER — FLUORESCEIN SODIUM 1 MG OP STRP
1.0000 | ORAL_STRIP | Freq: Once | OPHTHALMIC | Status: AC
  Administered 2021-09-15: 1 via OPHTHALMIC
  Filled 2021-09-15: qty 1

## 2021-09-15 MED ORDER — ERYTHROMYCIN 5 MG/GM OP OINT: 1.0000 | TOPICAL_OINTMENT | Freq: Three times a day (TID) | OPHTHALMIC | 0 refills | Status: DC

## 2021-09-15 NOTE — ED Notes (Signed)
Visual acuity 20/70 left and 20/100 right eye; pt st normally wears corrective lenses

## 2021-09-15 NOTE — Discharge Instructions (Signed)
Please seek medical attention for any high fevers, chest pain, shortness of breath, change in behavior, persistent vomiting, bloody stool or any other new or concerning symptoms.  

## 2021-09-15 NOTE — ED Notes (Signed)
See triage note  Presents with irritation to right eye for couple of days  Unsure if she got anything in it

## 2021-09-15 NOTE — ED Triage Notes (Signed)
Patient ambulatory to triage with steady gait, without difficulty or distress noted; pt reports since last night has sensation of foreign matter in rt eye

## 2021-09-15 NOTE — ED Provider Notes (Signed)
   [facility] Provider Note    Event Date/Time   First MD Initiated Contact with Patient 09/15/21 [phone]     (approximate)   History   Eye Problem   HPI  [name] is a 71 y.o. female who presents to the emergency department today because of sensation of foreign body to her right eye.  Patient states that she woke up yesterday with this sensation.  Since then her eye has been quite itchy.  She denies any trauma or getting anything into her eye.  She denies any visual changes.  Patient had similar symptoms on this in the past when she had pinkeye although states that currently she does not have the discharge like she did last time.  Physical Exam   Triage Vital Signs: ED Triage Vitals  Enc Vitals Group     BP 09/15/21 0352 (!) 152/81     Pulse Rate 09/15/21 0352 72     Resp 09/15/21 0352 18     Temp 09/15/21 0352 97.7 F (36.5 C)     Temp Source 09/15/21 0352 Oral     SpO2 09/15/21 0352 97 %     Weight 09/15/21 0355 167 lb (75.8 kg)     Height 09/15/21 0355 5\' 5"  (1.651 m)     Head Circumference --      Peak Flow --      Pain Score 09/15/21 0355 4     Pain Loc --      Pain Edu? --      Excl. in GC? --     Most recent vital signs: Vitals:   09/15/21 0352  BP: (!) 152/81  Pulse: 72  Resp: 18  Temp: 97.7 F (36.5 C)  SpO2: 97%   General: Awake, alert, oriented. CV:  Good peripheral perfusion.  Resp:  Normal effort.  Abd:  No distention.  Other:  Fluorescein stain wnl in bilateral eyes. No FB appreciated in right eye.   ED Results / Procedures / Treatments   Labs (all labs ordered are listed, but only abnormal results are displayed) Labs Reviewed - No data to display   EKG  None   RADIOLOGY None   PROCEDURES:  Critical Care performed: No  Procedures   MEDICATIONS ORDERED IN ED: Medications - No data to display   IMPRESSION / MDM / ASSESSMENT AND PLAN / ED COURSE  I reviewed the triage vital signs and the  nursing notes.                              Differential diagnosis includes, but is not limited to, bacterial conjunctivitis, viral conjunctivitis, allergic conjunctivitis, corneal abrasion, FB.  Patient's presentation is most consistent with acute presentation with potential threat to life or bodily function.  Patient presented to the emergency department today because of concerns for right eye discomfort and sensation of foreign body.  On exam no foreign body appreciated.  On fluorescein stain no corneal abrasion appreciated.  I do think at this time conjunctivitis.  I have lower suspicion for bacterial prescribe erythromycin ointment. Discussed follow up with ophthalmology with patient.    FINAL CLINICAL IMPRESSION(S) / ED DIAGNOSES   Final diagnoses:  Conjunctivitis of right eye, unspecified conjunctivitis type    Note:  This document was prepared using Dragon voice recognition software and may include unintentional dictation errors.    11/16/21, MD 09/15/21 [phone]

## 2021-11-24 ENCOUNTER — Telehealth: Payer: Self-pay | Admitting: Cardiovascular Disease

## 2021-11-24 NOTE — Telephone Encounter (Signed)
   Pre-operative Risk Assessment    Patient Name: [name]  DOB: Oct 16, 1950 MRN: [id number]      Request for Surgical Clearance    Procedure:   right total hip replacement   Date of Surgery:  Clearance TBD                                 Surgeon:  Dr Reinaldo Berber Surgeon's Group or Practice Name:  [facility] Phone number:  [phone] Fax number:  [phone]   Type of Clearance Requested:   - Medical    Type of Anesthesia:  Not Indicated   Additional requests/questions:  Please advise surgeon/provider what medications should be held.  Courtney Heys   11/24/2021, 4:00 PM

## 2021-11-24 NOTE — Telephone Encounter (Signed)
   Pre-operative Risk Assessment    Patient Name: [name]  DOB: 1951-03-11 MRN: [id number]      Request for Surgical Clearance    Procedure:  Total Hip Replacement  Date of Surgery:  Clearance TBD                                 Surgeon:  Reinaldo Berber Surgeon's Group or Practice Name:  [facility] Phone number:  [phone] Fax number:  [phone]   Type of Clearance Requested:   - Medical    Type of Anesthesia:  Not Indicated   Additional requests/questions:    Signed, Pilar A Ham   11/24/2021, 4:15 PM

## 2021-11-26 NOTE — Telephone Encounter (Signed)
   Name: LARA PALINKAS  DOB: 10/16/1950  MRN: [id number]  Primary Cardiologist: Lorine Bears, MD  Chart reviewed as part of pre-operative protocol coverage. Because of Rowyn Mustapha Broaden's past medical history and time since last visit, she will require a follow-up in-office visit in order to better assess preoperative cardiovascular risk.  Given leg pain limiting prior activity as well as mild-moderate MR and time since last visit, would benefit from in-office visit rather than doing virtual visit and still requiring annual follow-up.  Pre-op covering staff: - Please schedule appointment and call patient to inform them. If patient already had an upcoming appointment within acceptable timeframe, please add "pre-op clearance" to the appointment notes so provider is aware. Has OV 01/2022 with Cadence - if surgery planned before then, can move up visit - Please contact requesting surgeon's office via preferred method (i.e, phone, fax) to inform them of need for appointment prior to surgery.  No anticoags listed on MAR to hold.  Laurann Montana, PA-C  11/26/2021, 1:24 PM

## 2021-11-26 NOTE — Telephone Encounter (Signed)
LM for pt to call back to discuss if we need to move her appt up sooner or is it ok to keep in November.

## 2021-11-26 NOTE — Telephone Encounter (Signed)
Duplicate, will close out and act on other clearance

## 2021-11-27 ENCOUNTER — Telehealth: Payer: Self-pay | Admitting: Cardiovascular Disease

## 2021-11-27 ENCOUNTER — Other Ambulatory Visit: Payer: Self-pay | Admitting: Orthopedic Surgery

## 2021-11-27 NOTE — Telephone Encounter (Signed)
Pt returning call regarding appt. Please advise 

## 2021-11-27 NOTE — Telephone Encounter (Signed)
I called the pt back who just wanted to make sure she noted the right day for her appt next week. 12/02/21 @ 10:55. Pt thanked me for the call.

## 2021-11-27 NOTE — Telephone Encounter (Signed)
Pt needed sooner appt due to surgery date has been set now for 12/10/21. Pt has been scheduled to see Cadence Fransico Michael, [facility] 12/02/21 @ 10:55 in the [place] office. Pt grateful for the help.

## 2021-12-01 ENCOUNTER — Other Ambulatory Visit: Payer: Self-pay

## 2021-12-01 ENCOUNTER — Encounter
Admission: RE | Admit: 2021-12-01 | Discharge: 2021-12-01 | Disposition: A | Discharge status: Home / Self Care | Payer: Medicare Other | Source: Ambulatory Visit | Attending: Orthopedic Surgery | Admitting: Orthopedic Surgery

## 2021-12-01 VITALS — BP 160/82 | HR 68 | Resp 16 | Ht 65.0 in | Wt 170.0 lb

## 2021-12-01 DIAGNOSIS — Z01818 Encounter for other preprocedural examination: Secondary | ICD-10-CM | POA: Insufficient documentation

## 2021-12-01 DIAGNOSIS — R8290 Unspecified abnormal findings in urine: Secondary | ICD-10-CM | POA: Insufficient documentation

## 2021-12-01 DIAGNOSIS — Z01812 Encounter for preprocedural laboratory examination: Secondary | ICD-10-CM

## 2021-12-01 HISTORY — DX: Anemia, unspecified: D64.9

## 2021-12-01 HISTORY — DX: Dyspnea, unspecified: R06.00

## 2021-12-01 HISTORY — DX: Unspecified osteoarthritis, unspecified site: M19.90

## 2021-12-01 HISTORY — DX: Pneumonia, unspecified organism: J18.9

## 2021-12-01 LAB — TYPE AND SCREEN
ABO/RH(D): A POS
Antibody Screen: NEGATIVE

## 2021-12-01 LAB — CBC
HCT: 43.2 % (ref 36.0–46.0)
Hemoglobin: 13.1 g/dL (ref 12.0–15.0)
MCH: 23.2 pg — ABNORMAL LOW (ref 26.0–34.0)
MCHC: 30.3 g/dL (ref 30.0–36.0)
MCV: 76.5 fL — ABNORMAL LOW (ref 80.0–100.0)
Platelets: 273 10*3/uL (ref 150–400)
RBC: 5.65 MIL/uL — ABNORMAL HIGH (ref 3.87–5.11)
RDW: 15.5 % (ref 11.5–15.5)
WBC: 8 10*3/uL (ref 4.0–10.5)
nRBC: 0 % (ref 0.0–0.2)

## 2021-12-01 LAB — COMPREHENSIVE METABOLIC PANEL WITH GFR
ALT: 12 U/L (ref 0–44)
AST: 15 U/L (ref 15–41)
Albumin: 4.6 g/dL (ref 3.5–5.0)
Alkaline Phosphatase: 76 U/L (ref 38–126)
Anion gap: 10 (ref 5–15)
BUN: 19 mg/dL (ref 8–23)
CO2: 28 mmol/L (ref 22–32)
Calcium: 9.6 mg/dL (ref 8.9–10.3)
Chloride: 100 mmol/L (ref 98–111)
Creatinine, Ser: 0.75 mg/dL (ref 0.44–1.00)
GFR, Estimated: >60 mL/min
Glucose, Bld: 85 mg/dL (ref 70–99)
Potassium: 3.3 mmol/L — ABNORMAL LOW (ref 3.5–5.1)
Sodium: 138 mmol/L (ref 135–145)
Total Bilirubin: 0.8 mg/dL (ref 0.3–1.2)
Total Protein: 7.9 g/dL (ref 6.5–8.1)

## 2021-12-01 LAB — URINALYSIS, ROUTINE W REFLEX MICROSCOPIC
Bilirubin Urine: NEGATIVE
Glucose, UA: NEGATIVE mg/dL
Hgb urine dipstick: NEGATIVE
Ketones, ur: NEGATIVE mg/dL
Nitrite: NEGATIVE
Protein, ur: NEGATIVE mg/dL
Specific Gravity, Urine: 1.021 (ref 1.005–1.030)
WBC, UA: >50 WBC/hpf — ABNORMAL HIGH (ref 0–5)
pH: 5 (ref 5.0–8.0)

## 2021-12-01 NOTE — Progress Notes (Signed)
  Perioperative Services  Abnormal Lab Notification and Treatment Plan of Care  Date: 12/01/21  Name: [name] MRN:   [id number]  Re: Abnormal labs noted during PAT appointment  Provider Notified: Steffanie Rainwater, MD Notification mode: Routed and/or faxed via [place] of concern: Lab Results  Component Value Date   STAPHAUREUS POSITIVE (A) 12/01/2021   MRSAPCR POSITIVE (A) 12/01/2021    Notes: Patient is scheduled for a TOTAL HIP ARTHROPLASTY ANTERIOR APPROACH (Right: Hip) on 12/10/2021. She is scheduled to receive CEFAZOLIN pre-operatively. Surgical PCR (+) for MRSA; see above.  PLANS:  Review renal function. Estimated Creatinine Clearance: 68.1 mL/min (by C-G formula based on SCr of 0.75 mg/dL). Review allergies. No documented allergy to vancomycin. Order added for VANCOMYCIN 1 GRAM IV to current preoperative prophylactic regimen.  Patient with orders for both CEFAZOLIN + VANCOMYCIN to be given in the setting of documented MRSA (+) surgical PCR.   Guidelines suggest that a beta-lactam antibiotic (first or second generation cephalosporin) should be added for activity against gram-negative organisms.  Vancomycin appears to be less effective than cefazolin for preventing SSIs caused by MSSA. For this reason, the use of vancomycin in combination with cefazolin is favored for prevention of SSI due to MRSA and coagulase-negative staphylococci.  Notify primary attending surgeon of (+) MRSA result and that order has been placed for the Vancomycin by perioperative APP.   This is a Community education officer; no formal response is required.  Honor Loh, MSN, APRN, FNP-C, CEN [facility]  Peri-operative Services Nurse Practitioner Phone: [phone] 12/01/21 11:25 PM

## 2021-12-01 NOTE — Patient Instructions (Signed)
Your procedure is scheduled on: 12/10/21 - Thursday Report to the Registration Desk on the 1st floor of the [facility]. To find out your arrival time, please call [phone] between 1PM - 3PM on: 12/09/21 - Wednesday If your arrival time is 6:00 am, do not arrive prior to that time as the [place] entrance doors do not open until 6:00 am.  REMEMBER: Instructions that are not followed completely may result in serious medical risk, up to and including death; or upon the discretion of your surgeon and anesthesiologist your surgery may need to be rescheduled.  Do not eat food after midnight the night before surgery.  No gum chewing, lozengers or hard candies.  You may however, drink CLEAR liquids up to 2 hours before you are scheduled to arrive for your surgery. Do not drink anything within 2 hours of your scheduled arrival time.  Clear liquids include: - water  - apple juice without pulp - gatorade (not RED colors) - black coffee or tea (Do NOT add milk or creamers to the coffee or tea) Do NOT drink anything that is not on this list.  TAKE  ONLY THESE MEDICATIONS THE MORNING OF SURGERY WITH A SIP OF WATER:  - amLODipine (NORVASC) - celecoxib (CELEBREX) if needed - hydrALAZINE (APRESOLINE)  - levothyroxine (SYNTHROID)  Use inhaler albuterol (VENTOLIN)  on the day of surgery and bring to the hospital.  One week prior to surgery: diclofenac Sodium (VOLTAREN)  Stop Anti-inflammatories (NSAIDS) such as Advil, Aleve, Ibuprofen, Motrin, Naproxen, Naprosyn and Aspirin based products such as Excedrin, Goodys Powder, BC Powder.  Stop ANY OVER THE COUNTER supplements until after surgery.ascorbic acid (VITAMIN C) , cholecalciferol (VITAMIN D),   You may however, continue to take Tylenol if needed for pain up until the day of surgery.  No Alcohol for 24 hours before or after surgery.  No Smoking including e-cigarettes for 24 hours prior to surgery.  No chewable tobacco products for  at least 6 hours prior to surgery.  No nicotine patches on the day of surgery.  Do not use any "recreational" drugs for at least a week prior to your surgery.  Please be advised that the combination of cocaine and anesthesia may have negative outcomes, up to and including death. If you test positive for cocaine, your surgery will be cancelled.  On the morning of surgery brush your teeth with toothpaste and water, you may rinse your mouth with mouthwash if you wish. Do not swallow any toothpaste or mouthwash.  Use CHG Soap or wipes as directed on instruction sheet.  Do not wear jewelry, make-up, hairpins, clips or nail polish.  Do not wear lotions, powders, or perfumes.   Do not shave body from the neck down 48 hours prior to surgery just in case you cut yourself which could leave a site for infection.  Also, freshly shaved skin may become irritated if using the CHG soap.  Contact lenses, hearing aids and dentures may not be worn into surgery.  Do not bring valuables to the hospital. [facility] is not responsible for any missing/lost belongings or valuables.   Notify your doctor if there is any change in your medical condition (cold, fever, infection).  Wear comfortable clothing (specific to your surgery type) to the hospital.  After surgery, you can help prevent lung complications by doing breathing exercises.  Take deep breaths and cough every 1-2 hours. Your doctor may order a device called an Incentive Spirometer to help you take deep breaths.  When coughing or sneezing, hold a pillow firmly against your incision with both hands. This is called "splinting." Doing this helps protect your incision. It also decreases belly discomfort.  If you are being admitted to the hospital overnight, leave your suitcase in the car. After surgery it may be brought to your room.  If you are being discharged the day of surgery, you will not be allowed to drive home. You will need a responsible  adult (18 years or older) to drive you home and stay with you that night.   If you are taking public transportation, you will need to have a responsible adult (18 years or older) with you. Please confirm with your physician that it is acceptable to use public transportation.   Please call the Pre-admissions Testing Dept. at [phone] if you have any questions about these instructions.  Surgery Visitation Policy:  Patients undergoing a surgery or procedure may have two family members or support persons with them as long as the person is not COVID-19 positive or experiencing its symptoms.   Inpatient Visitation:    Visiting hours are 7 a.m. to 8 p.m. Up to four visitors are allowed at one time in a patient room, including children. The visitors may rotate out with other people during the day. One designated support person (adult) may remain overnight.

## 2021-12-02 ENCOUNTER — Encounter: Payer: Self-pay | Admitting: Medical

## 2021-12-02 ENCOUNTER — Ambulatory Visit: Payer: Medicare Other | Attending: Medical | Admitting: Medical

## 2021-12-02 VITALS — BP 126/68 | HR 86 | Ht 65.0 in | Wt 171.4 lb

## 2021-12-02 DIAGNOSIS — Z9889 Other specified postprocedural states: Secondary | ICD-10-CM | POA: Diagnosis not present

## 2021-12-02 DIAGNOSIS — I5032 Chronic diastolic (congestive) heart failure: Secondary | ICD-10-CM

## 2021-12-02 DIAGNOSIS — I340 Nonrheumatic mitral (valve) insufficiency: Secondary | ICD-10-CM

## 2021-12-02 DIAGNOSIS — Z87891 Personal history of nicotine dependence: Secondary | ICD-10-CM

## 2021-12-02 DIAGNOSIS — Z8679 Personal history of other diseases of the circulatory system: Secondary | ICD-10-CM | POA: Diagnosis not present

## 2021-12-02 DIAGNOSIS — I483 Typical atrial flutter: Secondary | ICD-10-CM | POA: Diagnosis not present

## 2021-12-02 DIAGNOSIS — Z01818 Encounter for other preprocedural examination: Secondary | ICD-10-CM

## 2021-12-02 LAB — SURGICAL PCR SCREEN
MRSA, PCR: POSITIVE — AB
Staphylococcus aureus: POSITIVE — AB

## 2021-12-02 LAB — URINE CULTURE: Culture: NO GROWTH
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
## 2021-12-02 NOTE — Patient Instructions (Signed)
Medication Instructions:  Your physician recommends that you continue on your current medications as directed. Please refer to the Current Medication list given to you today.  *If you need a refill on your cardiac medications before your next appointment, please call your pharmacy*     Follow-Up: At [facility], you and your health needs are our priority.  As part of our continuing mission to provide you with exceptional heart care, we have created designated Provider Care Teams.  These Care Teams include your primary Cardiologist (physician) and Advanced Practice Providers (APPs -  Physician Assistants and Nurse Practitioners) who all work together to provide you with the care you need, when you need it.  We recommend signing up for the patient portal called "MyChart".  Sign up information is provided on this After Visit Summary.  MyChart is used to connect with patients for Virtual Visits (Telemedicine).  Patients are able to view lab/test results, encounter notes, upcoming appointments, etc.  Non-urgent messages can be sent to your provider as well.   To learn more about what you can do with MyChart, go to [email or web address].    Your next appointment:   Follow up 1 year   The format for your next appointment:   In Person  Provider:   You may see Kathlyn Sacramento, MD or one of the following Advanced Practice Providers on your designated Care Team:   Murray Hodgkins, NP Christell Faith, PA-C Cadence [name], PA-C Gerrie Nordmann, NP    Other Instructions   Important Information About [place]

## 2021-12-03 ENCOUNTER — Telehealth: Payer: Self-pay | Admitting: Urgent Care

## 2021-12-03 DIAGNOSIS — Z22322 Carrier or suspected carrier of Methicillin resistant Staphylococcus aureus: Secondary | ICD-10-CM

## 2021-12-03 DIAGNOSIS — Z01812 Encounter for preprocedural laboratory examination: Secondary | ICD-10-CM

## 2021-12-03 MED ORDER — MUPIROCIN 2 % EX OINT: TOPICAL_OINTMENT | CUTANEOUS | 0 refills | Status: DC

## 2021-12-03 NOTE — Progress Notes (Signed)
  Perioperative Services Pre-Admission/Anesthesia Testing    Date: 12/03/21  Name: [name] MRN:   [id number]  Re: MRS (+) surgical PCR - additional intervention requested  Planned Surgical Procedure(s):  Procedure: TOTAL HIP ARTHROPLASTY ANTERIOR APPROACH (Right: Hip) Date of surgery: 12/10/2021 Surgeon: Dr. Steffanie Rainwater, MD  Clinical Notes:  Patient is scheduled for the above procedure on 12/10/2021 with Dr. Steffanie Rainwater, MD. patient with surgical PCR that was (+) for MRSA.  Standard plan of care implemented.  Patient to receive cefazolin + vancomycin preoperatively.  Additionally, patient's nose will be swabbed with Profend on the day of her procedure. Surgeon asking for additional coverage with mupirocin. Order sent as follows:  Meds ordered this encounter  Medications   mupirocin ointment (BACTROBAN) 2 %    Sig: Apply small amount to the inside of both nostrils TWICE a day for the next 5 days.    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 Order sent for mupirocin as per above. Patient will receive Profend on the day of surgery as well. Patient has been contacted to update her on the plan of care as it stands at this point. No other needs or actions required at this time. Plan of care forwarded to attending surgeon.   Encounter Diagnoses  Name Primary?   Pre-operative laboratory examination Yes   MRSA nasal colonization    Honor Loh, MSN, APRN, FNP-C, CEN [facility]  Peri-operative Services Nurse Practitioner Phone: [phone] 12/03/21 4:19 PM  NOTE: This note has been prepared using Dragon dictation software. Despite my best ability to proofread, there is always the potential that unintentional transcriptional errors may still occur from this process.

## 2021-12-08 ENCOUNTER — Encounter: Payer: Self-pay | Admitting: Urgent Care

## 2021-12-08 NOTE — Progress Notes (Signed)
Perioperative Services  Pre-Admission/Anesthesia Testing Clinical Review  Date: 12/08/21  Patient Demographics:  Name: [name] DOB:   1950-07-17 MRN:   [id number]  Planned Surgical Procedure(s):    Case: [id number] Date/Time: 12/10/21 1030   Procedure: TOTAL HIP ARTHROPLASTY ANTERIOR APPROACH (Right: Hip)   Anesthesia type: Choice   Pre-op diagnosis: Primary osteoarthritis of right hip  M16.11   Location: [facility] OR ROOM 01 / [facility]S [facility]   Surgeons: Reinaldo BerberAberman, Zachary, MD   NOTE: Available PAT nursing documentation and vital signs have been reviewed. Clinical nursing staff has updated patient's PMH/PSHx, current medication list, and drug allergies/intolerances to ensure comprehensive history available to assist in medical decision making as it pertains to the aforementioned surgical procedure and anticipated anesthetic course. Extensive review of available clinical information performed. [place] PMH and PSHx updated with any diagnoses/procedures that  may have been inadvertently omitted during her intake with the pre-admission testing department's nursing staff.  Clinical Discussion:  [name] is a 71 y.o. female who is submitted for pre-surgical anesthesia review and clearance prior to her undergoing the above procedure. Patient is a Former Smoker (quit 11/2015). Pertinent PMH includes: CAD, atrial flutter, diastolic dysfunction, ILBBB, HTN, subclinical hypothyroidism, GERD (no daily Tx), anemia, OA.  Patient is followed by cardiology Kirke Corin(Arida, MD). She was last seen in the cardiology clinic on 12/02/2021; notes reviewed.  At the time of her clinic visit, patient doing well overall from a cardiovascular perspective.  She denied any episodes of chest pain, shortness breath, PND, orthopnea, palpitations, significant peripheral edema, vertiginous symptoms, or presyncope/syncope.  Patient with a past medical history significant for cardiovascular  diagnoses.  Myocardial perfusion imaging study performed on 09/25/2015 revealed a mildly reduced left ventricular systolic function with an EF of 47%.  There was no evidence of stress-induced myocardial ischemia; no scintigraphic evidence of scar.  Baseline rhythm was atrial flutter, which was noted to be maintained throughout the study.  Overall study determined to be low risk.  Patient underwent DCCV procedure on 11/11/2015.  A single 150 J cardioversion restored NSR.  Long-term cardiac event monitor study performed on 12/02/2015 revealing predominant underlying sinus rhythm with an average heart rate of 62 bpm; range 45-127 bpm.  Of note, maximum heart rate was actually a rate of atrial tachycardia.  There were 193 isolated PACs and 11 atrial couplets noted.  There were 3 runs (26 beats) of supraventricular ectopy, which was felt to be atrial tachycardia with the longest lasting 17 beats at a rate of 128 bpm and the fastest lasting 5 beats at a rate of 154 bpm.  There were 80 isolated PVCs and 2 ventricular couplets with no ventricular runs.  Most recent TTE was performed on 08/23/2019 revealing a normal left ventricular systolic function with an EF of 55-60%. Left ventricular diastolic Doppler parameters consistent with pseudonormalization (G2DD).  Left atrium was severely dilated.  Right ventricular size and function was normal.  There was mild to moderate mitral valve regurgitation.  PASP moderately elevated at 46.2 mmHg.  There was no evidence of a significant transvalvular gradient to suggest stenosis.  Patient underwent radiofrequency catheter ablation procedure on 01/17/2016.  Procedure restored NSR.  Patient with an atrial flutter diagnosis; CHA2DS2-VASc Score = 2 (sex, HTN). Her rate and rhythm are currently being maintained without the use of pharmacological intervention.  He is not currently taking any type of oral anticoagulation therapy.  Blood pressure well controlled at 126/68 on  currently prescribed CCB (  amlodipine), diuretic (HCTZ), and ARB (losartan) therapies.  Patient is not on any prescribed lipid-lowering therapies for ASCVD prevention.  She is not diabetic.  Patient does not have an OSAH diagnosis.  Functional capacity limited by arthritides, however patient still felt to be able to achieve at least 4 METS of activity without experiencing any significant angina/anginal equivalent symptoms.  No changes were made to her medication regimen.  Patient to follow-up with outpatient cardiology in 1 year or sooner if needed.  [name] is scheduled for an elective RIGHT TOTAL HIP ARTHROPLASTY on 12/10/2021 with Dr. Reinaldo Berber, MD.  Given patient's past medical history significant for cardiovascular diagnoses, presurgical cardiac clearance was sought by the PAT team.  Per cardiology, "patient doing well from a cardiovascular perspective. She denies anginal symptoms. Function is limited due to hip pain.  Echo from 2021 showed an EF of 55-60%.  EKG today showed NSR with PACs. Vitals are stable.  METS >4. According to the RCRI, patient is a class II risk, which places her at a 6% risk of 30-day MACE.  Patient may proceed with surgery with an ACCEPTABLE risk of complications without further cardiac work-up". In review of her medication reconciliation, the patient is not noted to be taking any type of anticoagulation or antiplatelet therapies that would need to be held during her perioperative course.  Patient denies previous perioperative complications with anesthesia in the past. In review of the available records, it is noted that patient underwent a general anesthetic course at [facility] ([facility]) in 01/2016 without documented complications.      12/02/2021   11:17 AM 12/01/2021    2:12 PM 09/15/2021    3:55 AM  Vitals with BMI  Height 5\' 5"  5\' 5"  5\' 5"   Weight 171 lbs 6 oz 170 lbs 167 lbs  BMI 28.52 28.29 27.79  Systolic 126 160   Diastolic 68 82   Pulse 86 68      Providers/Specialists:   NOTE: Primary physician provider listed below. Patient may have been seen by APP or partner within same practice.   PROVIDER ROLE / SPECIALTY LAST , MD Orthopedics (Surgeon) 11/24/2021  Inc, [facility] Primary Care Provider ???  Wilber Bihari, MD Cardiology 12/02/2021  [facility], MD Physiatry 06/16/2021   Allergies:  Lisinopril, Tizanidine, and Amoxicillin  Current Home Medications:   No current facility-administered medications for this encounter.    acetaminophen (TYLENOL) 500 MG tablet   albuterol (VENTOLIN HFA) 108 (90 Base) MCG/ACT inhaler   amLODipine (NORVASC) 10 MG tablet   ascorbic acid (VITAMIN C) 500 MG tablet   celecoxib (CELEBREX) 100 MG capsule   cholecalciferol (VITAMIN D) 1000 units tablet   diclofenac Sodium (VOLTAREN) 1 % GEL   hydrALAZINE (APRESOLINE) 50 MG tablet   hydrochlorothiazide (HYDRODIURIL) 25 MG tablet   levothyroxine (SYNTHROID) 100 MCG tablet   levothyroxine (SYNTHROID, LEVOTHROID) 88 MCG tablet   losartan (COZAAR) 50 MG tablet   mupirocin ointment (BACTROBAN) 2 %   vitamin B-12 (CYANOCOBALAMIN) 500 MCG tablet   History:   Past Medical History:  Diagnosis Date   Anemia    Arthritis    Atrial flutter (HCC) 09/2015   a. CHADS2VASc 2 (HTN, female); b. on Eliquis; c. echo 7/17: EF 50-55%, no RWMA, rhythm atrial flutter, mildly dilated LA/RV, moderately to severely dilated RA, trivial pericardial effusion   Dyspnea    Essential hypertension    GERD (gastroesophageal reflux disease)    Pneumonia  as a child   Subclinical hypothyroidism    Tobacco abuse    Past Surgical History:  Procedure Laterality Date   ABDOMINAL HYSTERECTOMY     ABLATION OF DYSRHYTHMIC FOCUS  01/16/2016   ELECTROPHYSIOLOGIC STUDY N/A 11/11/2015   Procedure: CARDIOVERSION;  Surgeon: Antonieta Iba, MD;  Location: [facility] ORS;  Service: Cardiovascular;  Laterality: N/A;   ELECTROPHYSIOLOGIC  STUDY N/A 01/16/2016   Procedure: A-Flutter Ablation;  Surgeon: Duke Salvia, MD;  Location: [facility] INVASIVE CV LAB;  Service: Cardiovascular;  Laterality: N/A;   Family History  Problem Relation Age of Onset   Hypertension Mother    Diabetes Mother    Diabetes Father    Diabetes Brother    Hypertension Brother    Hypertension Brother    Heart attack Brother    Diabetes Brother    Breast cancer Neg Hx    Social History   Tobacco Use   Smoking status: Former    Types: Cigarettes    Quit date: 11/19/2015    Years since quitting: 6.0   Smokeless tobacco: Never  Vaping Use   Vaping Use: Never used  Substance Use Topics   Alcohol use: Yes    Comment: occasionally   Drug use: No    Pertinent Clinical Results:  LABS: Labs reviewed: Acceptable for surgery.  No visits with results within 3 Day(s) from this visit.  Latest known visit with results is:  Hospital Outpatient Visit on 12/01/2021  Component Date Value Ref Range Status   WBC 12/01/2021 8.0  4.0 - 10.5 K/uL Final   RBC 12/01/2021 5.65 (H)  3.87 - 5.11 MIL/uL Final   Hemoglobin 12/01/2021 13.1  12.0 - 15.0 g/dL Final   HCT 38/18/2993 43.2  36.0 - 46.0 % Final   MCV 12/01/2021 76.5 (L)  80.0 - 100.0 fL Final   MCH 12/01/2021 23.2 (L)  26.0 - 34.0 pg Final   MCHC 12/01/2021 30.3  30.0 - 36.0 g/dL Final   RDW 71/69/6789 15.5  11.5 - 15.5 % Final   Platelets 12/01/2021 273  150 - 400 K/uL Final   nRBC 12/01/2021 0.0  0.0 - 0.2 % Final   Performed at [facility], [address] [place], [place] [address]   Sodium 12/01/2021 138  135 - 145 mmol/L Final   Potassium 12/01/2021 3.3 (L)  3.5 - 5.1 mmol/L Final   Chloride 12/01/2021 100  98 - 111 mmol/L Final   CO2 12/01/2021 28  22 - 32 mmol/L Final   Glucose, Bld 12/01/2021 85  70 - 99 mg/dL Final   Glucose reference range applies only to samples taken after fasting for at least 8 hours.   BUN 12/01/2021 19  8 - 23 mg/dL Final   Creatinine, Ser 12/01/2021 0.75   0.44 - 1.00 mg/dL Final   Calcium 75/12/2583 9.6  8.9 - 10.3 mg/dL Final   Total Protein 27/78/2423 7.9  6.5 - 8.1 g/dL Final   Albumin 53/61/4431 4.6  3.5 - 5.0 g/dL Final   AST 54/00/8676 15  15 - 41 U/L Final   ALT 12/01/2021 12  0 - 44 U/L Final   Alkaline Phosphatase 12/01/2021 76  38 - 126 U/L Final   Total Bilirubin 12/01/2021 0.8  0.3 - 1.2 mg/dL Final   GFR, Estimated 12/01/2021 >60  >60 mL/min Final   Comment: (NOTE) Calculated using the CKD-EPI Creatinine Equation (2021)    Anion gap 12/01/2021 10  5 - 15 Final   Performed at  [facility], [address] [place], [place] [address]   Color, Urine 12/01/2021 YELLOW (A)  YELLOW Final   APPearance 12/01/2021 CLOUDY (A)  CLEAR Final   Specific Gravity, Urine 12/01/2021 1.021  1.005 - 1.030 Final   pH 12/01/2021 5.0  5.0 - 8.0 Final   Glucose, UA 12/01/2021 NEGATIVE  NEGATIVE mg/dL Final   Hgb urine dipstick 12/01/2021 NEGATIVE  NEGATIVE Final   Bilirubin Urine 12/01/2021 NEGATIVE  NEGATIVE Final   Ketones, ur 12/01/2021 NEGATIVE  NEGATIVE mg/dL Final   Protein, ur 32/44/0102 NEGATIVE  NEGATIVE mg/dL Final   Nitrite 72/53/6644 NEGATIVE  NEGATIVE Final   Leukocytes,Ua 12/01/2021 LARGE (A)  NEGATIVE Final   RBC / HPF 12/01/2021 11-20  0 - 5 RBC/hpf Final   WBC, UA 12/01/2021 >50 (H)  0 - 5 WBC/hpf Final   Bacteria, UA 12/01/2021 RARE (A)  NONE SEEN Final   Squamous Epithelial / LPF 12/01/2021 0-5  0 - 5 Final   Mucus 12/01/2021 PRESENT   Final   Performed at [facility], [address] [place], [place] [address]   ABO/RH(D) 12/01/2021 A POS   Final   Antibody Screen 12/01/2021 NEG   Final   Sample Expiration 12/01/2021 12/15/2021,2359   Final   Extend sample reason 12/01/2021    Final                   Value:NO TRANSFUSIONS OR PREGNANCY IN THE PAST 3 MONTHS Performed at [facility], [address] [place], [place] [address]    MRSA, PCR 12/01/2021 POSITIVE (A)  NEGATIVE Final   Comment:  RESULT CALLED TO, READ BACK BY AND VERIFIED WITH: Macee Venables 12/02/21 0812 MW    Staphylococcus aureus 12/01/2021 POSITIVE (A)  NEGATIVE Final   Comment: (NOTE) The Xpert SA Assay (FDA approved for NASAL specimens in patients 79 years of age and older), is one component of a comprehensive surveillance program. It is not intended to diagnose infection nor to guide or monitor treatment. Performed at [facility], [address] [place], [place] [address]     ECG: Date: 12/01/2021 Time ECG obtained: 1501 PM Rate: 61 bpm Rhythm:  Sinus rhythm with PACs; LVH Axis (leads I and aVF): Normal Intervals: PR 134 ms. QRS 100 ms. QTc 436 ms. ST segment and T wave changes: No evidence of acute ST segment elevation or depression Comparison: Similar to previous tracing obtained on 06/28/2021   IMAGING / PROCEDURES: DIAGNOSTIC RADIOGRAPHS OF PELVIS 3+ VIEWS performed on 11/24/2021 Significant arthritic changes to both hips with the RIGHT being worse than the LEFT Joint space narrowing Osteophyte formation Subchondral sclerosis Severe RIGHT hip osteoarthritis  TRANSTHORACIC ECHOCARDIOGRAM performed on 08/23/2019 Normal left ventricular systolic function with an EF of 55-60% Left ventricular diastolic Doppler parameters consistent with pseudonormalization (G2DD). Left atrium severely dilated Normal right ventricular systolic function Moderately elevated PASP of 46.2 mmHg Mild to moderate mitral valve regurgitation Normal transvalvular gradients; no valvular stenosis No pericardial effusion  MYOCARDIAL PERFUSION IMAGING STUDY (LEXISCAN) performed on 09/25/2015 Mildly reduced left ventricular systolic function with an EF of 47% Rhythm remained atrial flutter throughout the study. No evidence of ST segment deviation noted during stress No evidence of stress-induced myocardial ischemia; no scintigraphic evidence of scar Low risk study  Impression and Plan:  [name] has  been referred for pre-anesthesia review and clearance prior to her undergoing the planned anesthetic and procedural courses. Available labs, pertinent testing, and imaging results were personally  reviewed by me. This patient has been appropriately cleared by cardiology with an overall ACCEPTABLE risk of significant perioperative cardiovascular complications.  Based on clinical review performed today (12/08/21), barring any significant acute changes in the patient's overall condition, it is anticipated that she will be able to proceed with the planned surgical intervention. Any acute changes in clinical condition may necessitate her procedure being postponed and/or cancelled. Patient will meet with anesthesia team (MD and/or CRNA) on the day of her procedure for preoperative evaluation/assessment. Questions regarding anesthetic course will be fielded at that time.   Pre-surgical instructions were reviewed with the patient during her PAT appointment and questions were fielded by PAT clinical staff. Patient was advised that if any questions or concerns arise prior to her procedure then she should return a call to PAT and/or her surgeon's office to discuss.  Honor Loh, MSN, APRN, FNP-C, CEN [facility]  Peri-operative Services Nurse Practitioner Phone: [phone] Fax: [phone] 12/08/21 9:41 AM  NOTE: This note has been prepared using Dragon dictation software. Despite my best ability to proofread, there is always the potential that unintentional transcriptional errors may still occur from this process.

## 2021-12-09 MED ORDER — ORAL CARE MOUTH RINSE: 15.0000 mL | Freq: Once | OROMUCOSAL | Status: AC

## 2021-12-09 MED ORDER — FAMOTIDINE 20 MG PO TABS: 20.0000 mg | ORAL_TABLET | Freq: Once | ORAL | Status: AC

## 2021-12-09 MED ORDER — VANCOMYCIN HCL IN DEXTROSE 1-5 GM/200ML-% IV SOLN: 1000.0000 mg | Freq: Once | INTRAVENOUS | Status: AC

## 2021-12-09 MED ORDER — CEFAZOLIN SODIUM-DEXTROSE 2-4 GM/100ML-% IV SOLN
2.0000 g | INTRAVENOUS | Status: AC
  Administered 2021-12-10: 2 g via INTRAVENOUS

## 2021-12-09 MED ORDER — LACTATED RINGERS IV SOLN: INTRAVENOUS | Status: DC

## 2021-12-09 MED ORDER — CHLORHEXIDINE GLUCONATE 0.12 % MT SOLN: 15.0000 mL | Freq: Once | OROMUCOSAL | Status: AC

## 2021-12-09 NOTE — H&P (Signed)
History of Present Illness: [name] is an 71 y.o. female seen for a new patient visit for right hip complaints.  The symptoms started 1 year ago.The symptoms are gradual in onset.   She reports no new injury or accident.  She reports 7/10 pain.  The pain is described as aching and stabbing and it is located lateral.   She reports a history of injection into her left hip with some relief of symptoms.she reports the right hip pain has prevented her from participating in activities of daily living and that her the symptoms are aggravated by activity and walking.  The pain is improved with rest and meloxicam although the medication anymore.  yes symptoms at night time.  She does not feel like she can manage with her hip pain anymore and is interested in surgery. she denies any trauma fevers chills or other injuries.   Past Medical History:     Past Medical History:  Diagnosis Date   Atrial flutter (CMS-HCC)      a. CHADS2VASc 2 (HTN, female); b. on Eliquis; c. echo 7/17: EF 50-55%, no RWMA, rhythm atrial flutter, mildly dilated LA/RV, moderately to severely dilated RA, trivial pericardial effusion   GERD (gastroesophageal reflux disease)     Hypertension     Subclinical hypothyroidism        Past Surgical History:      Past Surgical History:  Procedure Laterality Date   ELECTROPHYSIOLOGIC STUDY   11/11/2015    Procedure: CARDIOVERSION; Surgeon: Antonieta Iba, MD; Location: [facility] ORS; Service: Cardiovascular; Laterality: N/A;   ABLATION OF DYSRHYTHMIC FOCUS. ELECTROPHYSIOLOGIC STUDY.   01/16/2016    Procedure: A-Flutter Ablation; Surgeon: Duke Salvia, MD; Location: [facility] INVASIVE CV LAB; Service: Cardiovascular; Laterality: N/A;   HYSTERECTOMY VAGINAL          Past Family History: Family History       Family History  Problem Relation Age of Onset   Diabetes Mother     High blood pressure (Hypertension) Mother     Diabetes Father     High blood pressure (Hypertension) Father      Diabetes Brother     High blood pressure (Hypertension) Brother     Kidney disease Brother         Medications:       Current Outpatient Medications Ordered in Epic  Medication Sig Dispense Refill   meloxicam (MOBIC) 15 MG tablet         pregabalin (LYRICA) 50 MG capsule TAKE 1 CAPSULE BY MOUTH THREE TIMES DAILY FOR NERVE PAIN       acetaminophen (TYLENOL) 500 MG tablet Take 500 mg by mouth as needed       amLODIPine (NORVASC) 10 MG tablet Take 1 tablet (10 mg total) by mouth once daily       ascorbic acid, vitamin C, 500 mg Chew Take 500 mg by mouth once daily       celecoxib (CELEBREX) 200 MG capsule Take 1 capsule (200 mg total) by mouth 2 (two) times daily 60 capsule 2   cholecalciferol (VITAMIN D3) 1000 unit tablet Take 1,000 Units by mouth once daily       cyanocobalamin (VITAMIN B12) 500 MCG tablet Take 500 mcg by mouth once daily       cyclobenzaprine (FLEXERIL) 10 MG tablet Take 1 tablet p.o. twice daily as needed muscle spasms 60 tablet 5   diclofenac (VOLTAREN) 1 % topical gel as needed       fluocinolone acetonide (  DERMOTIC) 0.01 % otic drop INSTILL 2 TO 3 DROPS INTO EACH EAR TWICE A WEEK AS NEEDED FOR ITCHING       hydrALAZINE (APRESOLINE) 50 MG tablet Take 1 tablet (50 mg total) by mouth 2 (two) times daily       hydroCHLOROthiazide (HYDRODIURIL) 25 MG tablet Take 1 tablet (25 mg total) by mouth once daily       levothyroxine (SYNTHROID) 100 MCG tablet TAKE 1 TABLET BY MOUTH ONCE DAILY FOR HYPOTHYROIDISM       losartan (COZAAR) 50 MG tablet Take 50 mg by mouth once daily       methocarbamoL (ROBAXIN) 500 MG tablet 0.5 tablets as needed        No current Epic-ordered facility-administered medications on file.      Allergies:      Allergies  Allergen Reactions   Lisinopril Swelling      Swelling of lips and face.    Tizanidine Other (See Comments)      bradycardia   Amoxicillin Rash and Itching      Has patient had a PCN reaction causing immediate rash,  facial/tongue/throat swelling, SOB or lightheadedness with hypotension: Yes Has patient had a PCN reaction causing severe rash involving mucus membranes or skin necrosis: No Has patient had a PCN reaction that required hospitalization No Has patient had a PCN reaction occurring within the last 10 years: Yes If all of the above answers are "NO", then may proceed with Cephalosporin use.      Review of Systems:  A comprehensive 14 point ROS was performed, reviewed, and the pertinent orthopaedic findings are documented in the HPI.   Physical Exam: Body mass index is 28.32 kg/m. General/Constitutional: No apparent distress: well-nourished and well developed. EXAM; HEENT [id number] Lymphatic: No palpable adenopathy. Respiratory: Non-labored breathing Vascular: No edema, swelling or tenderness, except as noted in detailed exam. Integumentary: No impressive skin lesions present, except as noted in detailed exam. Neuro/Psych: Normal mood and affect, oriented to person, place and time. Musculoskeletal: Normal, except as noted in detailed exam and in HPI.   Right hip exam   SKIN: intact SWELLING: none WARMTH: no warmth TENDERNESS: none, Stinchfield Positive ROM: 0 degrees internal rotation and 20 degrees external rotation and pain with internal rotation STRENGTH: normal GAIT: antalgic and stiff-legged STABILITY: stable to testing CREPITUS: yes LEG LENGTH DISCREPANCY: left longer by 1 cm NEUROLOGICAL EXAM: normal VASCULAR EXAM: normal LUMBAR SPINE:        tenderness: yes                                     straight leg raising sign: no                                     motor exam: normal   The contralateral hip was examined for comparison and it showed: TENDERNESS: none ROM: 5 degrees internal rotation, 20 degrees external rotation, and pain with end internal rotation but less than on right STRENGTH: normal STABILITY: stable to testing    Pulmonary exam: Lungs clear to auscultation  bilaterally no wheezing rales or rhonchi Cardiac exam: Regular rate and rhythm no obvious murmurs rubs or gallops.   Hip Imaging :  AP pelvis right frog-leg and lateral sit/stand cross table pelvis x-rays taken today show significant arthritic changes to both hips  right worse than left with joint space narrowing osteophyte formation sclerosis subchondral cystic change.  Severe right hip osteoarthritis.   Assessment:      Encounter Diagnosis  Name Primary?   Primary osteoarthritis of right hip Yes        Plan: I think that she most likely has severe right hip DJD.  The findings were discussed with the patient all imaging was reviewed with the patient and an extensive conversation was had regarding the natural history of arthritis, continued nonoperative management options, and surgery.  Patient educational materials were provided today regarding the diagnosis and treatment options.    A long discussion took place with the patient describing what a total joint replacement is and what the procedure would entail. A hip model, similar to the implants that will be used during the operation, was utilized to demonstrate the implants. Choices of implant manufactures were discussed and reviewed. The ability to secure the implant utilizing cement or cementless (press fit) fixation was discussed. Anterior and posterior exposures were discussed. For this patient an appropriate approach will be the anterior approach.    The hospitalization and post-operative care and rehabilitation were also discussed. The use of perioperative antibiotics and DVT prophylaxis were discussed. The risk, benefits and alternatives to a surgical intervention were discussed at length with the patient. The patient was also advised of risks related to the medical comorbidities and elevated body mass index (BMI). A lengthy discussion took place to review the most common complications including but not limited to: deep vein thrombosis,  pulmonary embolus, heart attack, stroke, infection, wound breakdown, numbness, leg length in-equality, damage to nerves, tendon,muscles, arteries or other blood vessels, death and other possible complications from anesthesia. The patient was told that we will take steps to minimize these risks by using sterile technique, antibiotics and DVT prophylaxis when appropriate and follow the patient postoperatively in the office setting to monitor progress. The possibility of recurrent pain, no improvement in pain and actual worsening of pain were also discussed with the patient.   The discharge plan of care focused on the patient going home following surgery. The patient was encouraged to make the necessary arrangements to have someone stay with them when they are discharged home.    The benefits of surgery were discussed with the patient including the potential for improving the patient's current clinical condition through operative intervention. Alternatives to surgical intervention including continued conservative management were also discussed in detail. All questions were answered to the satisfaction of the patient. The patient participated and agreed to the plan of care as well as the use of the recommended implants for their total hip replacement surgery.         Patient will need Medical and Cardiac clearance for the surgery and to have her thyroid levels rechecked. The patient reports she stopped smoking 3 years ago.    We also discussed the risk of dislocation and precautions following surgery.   Note reviewed and patient history and physical performed on day of surgery.

## 2021-12-10 ENCOUNTER — Encounter
Admission: RE | Disposition: A | Discharge status: Home Health | Payer: Self-pay | Source: Home / Self Care | Attending: Orthopedic Surgery

## 2021-12-10 ENCOUNTER — Ambulatory Visit: Payer: Medicare Other | Admitting: Urgent Care

## 2021-12-10 ENCOUNTER — Other Ambulatory Visit: Payer: Self-pay

## 2021-12-10 ENCOUNTER — Observation Stay
Admission: RE | Admit: 2021-12-10 | Discharge: 2021-12-11 | Disposition: A | Discharge status: Home Health | Payer: Medicare Other | Attending: Orthopedic Surgery | Admitting: Orthopedic Surgery

## 2021-12-10 ENCOUNTER — Encounter: Payer: Self-pay | Admitting: *Deleted

## 2021-12-10 ENCOUNTER — Ambulatory Visit: Payer: Medicare Other

## 2021-12-10 DIAGNOSIS — Z79899 Other long term (current) drug therapy: Secondary | ICD-10-CM | POA: Diagnosis not present

## 2021-12-10 DIAGNOSIS — E039 Hypothyroidism, unspecified: Secondary | ICD-10-CM | POA: Insufficient documentation

## 2021-12-10 DIAGNOSIS — I10 Essential (primary) hypertension: Secondary | ICD-10-CM | POA: Diagnosis not present

## 2021-12-10 DIAGNOSIS — Z01812 Encounter for preprocedural laboratory examination: Secondary | ICD-10-CM

## 2021-12-10 DIAGNOSIS — M1611 Unilateral primary osteoarthritis, right hip: Principal | ICD-10-CM | POA: Insufficient documentation

## 2021-12-10 DIAGNOSIS — R8290 Unspecified abnormal findings in urine: Secondary | ICD-10-CM

## 2021-12-10 HISTORY — DX: Fatty (change of) liver, not elsewhere classified: K76.0

## 2021-12-10 HISTORY — DX: Left bundle-branch block, unspecified: I44.7

## 2021-12-10 HISTORY — PX: TOTAL HIP ARTHROPLASTY: SHX124

## 2021-12-10 HISTORY — DX: Atherosclerotic heart disease of native coronary artery without angina pectoris: I25.10

## 2021-12-10 LAB — ABO/RH: ABO/RH(D): A POS

## 2021-12-10 SURGERY — ARTHROPLASTY, HIP, TOTAL, ANTERIOR APPROACH
Anesthesia: General | Site: Hip | Laterality: Right

## 2021-12-10 MED ORDER — MIDAZOLAM HCL 5 MG/5ML IJ SOLN
INTRAMUSCULAR | Status: DC | PRN
  Administered 2021-12-10 (×2): 1 mg via INTRAVENOUS

## 2021-12-10 MED ORDER — BUPIVACAINE HCL (PF) 0.5 % IJ SOLN
INTRAMUSCULAR | Status: DC | PRN
  Administered 2021-12-10: 2.8 mL via INTRATHECAL

## 2021-12-10 MED ORDER — PHENYLEPHRINE HCL (PRESSORS) 10 MG/ML IV SOLN
INTRAVENOUS | Status: DC | PRN
  Administered 2021-12-10 (×3): 80 ug via INTRAVENOUS

## 2021-12-10 MED ORDER — OXYCODONE HCL 5 MG PO TABS
5.0000 mg | ORAL_TABLET | Freq: Once | ORAL | Status: AC
  Administered 2021-12-10: 5 mg via ORAL
  Filled 2021-12-10: qty 1

## 2021-12-10 MED ORDER — OXYCODONE HCL 5 MG/5ML PO SOLN: 5.0000 mg | Freq: Once | ORAL | Status: DC | PRN

## 2021-12-10 MED ORDER — METOCLOPRAMIDE HCL 5 MG/ML IJ SOLN: 5.0000 mg | Freq: Three times a day (TID) | INTRAMUSCULAR | Status: DC | PRN

## 2021-12-10 MED ORDER — METOCLOPRAMIDE HCL 5 MG PO TABS: 5.0000 mg | ORAL_TABLET | Freq: Three times a day (TID) | ORAL | Status: DC | PRN

## 2021-12-10 MED ORDER — SODIUM CHLORIDE 0.9 % IR SOLN: Status: DC | PRN

## 2021-12-10 MED ORDER — ACETAMINOPHEN 500 MG PO TABS
1000.0000 mg | ORAL_TABLET | Freq: Three times a day (TID) | ORAL | Status: DC
  Administered 2021-12-10: 1000 mg via ORAL
  Filled 2021-12-10 (×2): qty 2

## 2021-12-10 MED ORDER — ACETAMINOPHEN 10 MG/ML IV SOLN
INTRAVENOUS | Status: AC
  Filled 2021-12-10: qty 100

## 2021-12-10 MED ORDER — MENTHOL 3 MG MT LOZG: 1.0000 | LOZENGE | OROMUCOSAL | Status: DC | PRN

## 2021-12-10 MED ORDER — SURGIPHOR WOUND IRRIGATION SYSTEM - OPTIME: TOPICAL | Status: DC | PRN

## 2021-12-10 MED ORDER — LOSARTAN POTASSIUM 50 MG PO TABS
50.0000 mg | ORAL_TABLET | Freq: Every day | ORAL | Status: DC
  Administered 2021-12-11: 50 mg via ORAL
  Filled 2021-12-10: qty 1

## 2021-12-10 MED ORDER — ONDANSETRON HCL 4 MG PO TABS: 4.0000 mg | ORAL_TABLET | Freq: Four times a day (QID) | ORAL | Status: DC | PRN

## 2021-12-10 MED ORDER — FAMOTIDINE 20 MG PO TABS
ORAL_TABLET | ORAL | Status: AC
  Administered 2021-12-10: 20 mg via ORAL
  Filled 2021-12-10: qty 1

## 2021-12-10 MED ORDER — DEXAMETHASONE SODIUM PHOSPHATE 10 MG/ML IJ SOLN
INTRAMUSCULAR | Status: AC
  Filled 2021-12-10: qty 1

## 2021-12-10 MED ORDER — PHENYLEPHRINE 80 MCG/ML (10ML) SYRINGE FOR IV PUSH (FOR BLOOD PRESSURE SUPPORT)
PREFILLED_SYRINGE | INTRAVENOUS | Status: AC
  Filled 2021-12-10: qty 10

## 2021-12-10 MED ORDER — PHENOL 1.4 % MT LIQD: 1.0000 | OROMUCOSAL | Status: DC | PRN

## 2021-12-10 MED ORDER — ONDANSETRON HCL 4 MG/2ML IJ SOLN
INTRAMUSCULAR | Status: AC
  Filled 2021-12-10: qty 2

## 2021-12-10 MED ORDER — LEVOTHYROXINE SODIUM 88 MCG PO TABS
88.0000 ug | ORAL_TABLET | Freq: Every day | ORAL | Status: DC
  Administered 2021-12-11: 88 ug via ORAL
  Filled 2021-12-10: qty 1

## 2021-12-10 MED ORDER — BUPIVACAINE LIPOSOME 1.3 % IJ SUSP
INTRAMUSCULAR | Status: AC
  Filled 2021-12-10: qty 20

## 2021-12-10 MED ORDER — ENOXAPARIN SODIUM 40 MG/0.4ML IJ SOSY
40.0000 mg | PREFILLED_SYRINGE | INTRAMUSCULAR | Status: DC
  Administered 2021-12-11: 40 mg via SUBCUTANEOUS
  Filled 2021-12-10: qty 0.4

## 2021-12-10 MED ORDER — FENTANYL CITRATE (PF) 100 MCG/2ML IJ SOLN
25.0000 ug | INTRAMUSCULAR | Status: DC | PRN
  Administered 2021-12-10 (×4): 25 ug via INTRAVENOUS

## 2021-12-10 MED ORDER — ONDANSETRON HCL 4 MG/2ML IJ SOLN
INTRAMUSCULAR | Status: DC | PRN
  Administered 2021-12-10: 4 mg via INTRAVENOUS

## 2021-12-10 MED ORDER — VASOPRESSIN 20 UNIT/ML IV SOLN
INTRAVENOUS | Status: DC | PRN
  Administered 2021-12-10: 1 [IU] via INTRAVENOUS
  Administered 2021-12-10: 2 [IU] via INTRAVENOUS
  Administered 2021-12-10 (×3): 1 [IU] via INTRAVENOUS

## 2021-12-10 MED ORDER — ALBUTEROL SULFATE (2.5 MG/3ML) 0.083% IN NEBU: 3.0000 mL | INHALATION_SOLUTION | RESPIRATORY_TRACT | Status: DC | PRN

## 2021-12-10 MED ORDER — EPHEDRINE SULFATE (PRESSORS) 50 MG/ML IJ SOLN
INTRAMUSCULAR | Status: DC | PRN
  Administered 2021-12-10 (×2): 10 mg via INTRAVENOUS
  Administered 2021-12-10: 12.5 mg via INTRAVENOUS
  Administered 2021-12-10: 7.5 mg via INTRAVENOUS
  Administered 2021-12-10: 10 mg via INTRAVENOUS

## 2021-12-10 MED ORDER — ONDANSETRON HCL 4 MG/2ML IJ SOLN: 4.0000 mg | Freq: Once | INTRAMUSCULAR | Status: DC | PRN

## 2021-12-10 MED ORDER — BUPIVACAINE-EPINEPHRINE (PF) 0.25% -1:200000 IJ SOLN
INTRAMUSCULAR | Status: AC
  Filled 2021-12-10: qty 30

## 2021-12-10 MED ORDER — ONDANSETRON HCL 4 MG/2ML IJ SOLN
4.0000 mg | Freq: Four times a day (QID) | INTRAMUSCULAR | Status: DC | PRN
  Administered 2021-12-10: 4 mg via INTRAVENOUS
  Filled 2021-12-10: qty 2

## 2021-12-10 MED ORDER — CELECOXIB 200 MG PO CAPS
200.0000 mg | ORAL_CAPSULE | Freq: Two times a day (BID) | ORAL | Status: DC
  Administered 2021-12-11: 200 mg via ORAL
  Filled 2021-12-10: qty 1

## 2021-12-10 MED ORDER — FENTANYL CITRATE (PF) 100 MCG/2ML IJ SOLN
INTRAMUSCULAR | Status: AC
  Filled 2021-12-10: qty 2

## 2021-12-10 MED ORDER — ACETAMINOPHEN 10 MG/ML IV SOLN
INTRAVENOUS | Status: DC | PRN
  Administered 2021-12-10: 1000 mg via INTRAVENOUS

## 2021-12-10 MED ORDER — TRANEXAMIC ACID 1000 MG/10ML IV SOLN
INTRAVENOUS | Status: AC
  Filled 2021-12-10: qty 20

## 2021-12-10 MED ORDER — EPHEDRINE 5 MG/ML INJ
INTRAVENOUS | Status: AC
  Filled 2021-12-10: qty 5

## 2021-12-10 MED ORDER — CEFAZOLIN SODIUM-DEXTROSE 2-4 GM/100ML-% IV SOLN
INTRAVENOUS | Status: AC
  Filled 2021-12-10: qty 100

## 2021-12-10 MED ORDER — LACTATED RINGERS IV SOLN: INTRAVENOUS | Status: DC

## 2021-12-10 MED ORDER — PROPOFOL 10 MG/ML IV BOLUS
INTRAVENOUS | Status: DC | PRN
  Administered 2021-12-10: 30 mg via INTRAVENOUS

## 2021-12-10 MED ORDER — CHLORHEXIDINE GLUCONATE 0.12 % MT SOLN
OROMUCOSAL | Status: AC
  Administered 2021-12-10: 15 mL via OROMUCOSAL
  Filled 2021-12-10: qty 15

## 2021-12-10 MED ORDER — FENTANYL CITRATE (PF) 100 MCG/2ML IJ SOLN
INTRAMUSCULAR | Status: DC | PRN
  Administered 2021-12-10: 25 ug via INTRAVENOUS

## 2021-12-10 MED ORDER — SODIUM CHLORIDE (PF) 0.9 % IJ SOLN
INTRAMUSCULAR | Status: DC | PRN
  Administered 2021-12-10: 90 mL via INTRAMUSCULAR

## 2021-12-10 MED ORDER — SODIUM CHLORIDE FLUSH 0.9 % IV SOLN
INTRAVENOUS | Status: AC
  Filled 2021-12-10: qty 40

## 2021-12-10 MED ORDER — KETOROLAC TROMETHAMINE 30 MG/ML IJ SOLN
INTRAMUSCULAR | Status: DC | PRN
  Administered 2021-12-10: 30 mg via INTRAVENOUS

## 2021-12-10 MED ORDER — TRANEXAMIC ACID-NACL 1000-0.7 MG/100ML-% IV SOLN
INTRAVENOUS | Status: DC | PRN
  Administered 2021-12-10 (×2): 1000 mg via INTRAVENOUS

## 2021-12-10 MED ORDER — ACETAMINOPHEN 10 MG/ML IV SOLN: 1000.0000 mg | Freq: Once | INTRAVENOUS | Status: DC | PRN

## 2021-12-10 MED ORDER — PROPOFOL 500 MG/50ML IV EMUL
INTRAVENOUS | Status: DC | PRN
  Administered 2021-12-10: 85 ug/kg/min via INTRAVENOUS
  Administered 2021-12-10: 75 ug/kg/min via INTRAVENOUS

## 2021-12-10 MED ORDER — MORPHINE SULFATE (PF) 2 MG/ML IV SOLN: 0.5000 mg | INTRAVENOUS | Status: DC | PRN

## 2021-12-10 MED ORDER — TRAMADOL HCL 50 MG PO TABS: 50.0000 mg | ORAL_TABLET | Freq: Four times a day (QID) | ORAL | Status: DC | PRN

## 2021-12-10 MED ORDER — 0.9 % SODIUM CHLORIDE (POUR BTL) OPTIME
TOPICAL | Status: DC | PRN
  Administered 2021-12-10: 1000 mL

## 2021-12-10 MED ORDER — HYDROCODONE-ACETAMINOPHEN 5-325 MG PO TABS
1.0000 | ORAL_TABLET | ORAL | Status: DC | PRN
  Administered 2021-12-11: 2 via ORAL
  Filled 2021-12-10 (×2): qty 2

## 2021-12-10 MED ORDER — OXYCODONE HCL 5 MG PO TABS: 5.0000 mg | ORAL_TABLET | Freq: Once | ORAL | Status: DC | PRN

## 2021-12-10 MED ORDER — DEXAMETHASONE SODIUM PHOSPHATE 10 MG/ML IJ SOLN
INTRAMUSCULAR | Status: DC | PRN
  Administered 2021-12-10: 10 mg via INTRAVENOUS

## 2021-12-10 MED ORDER — MIDAZOLAM HCL 2 MG/2ML IJ SOLN
INTRAMUSCULAR | Status: AC
  Filled 2021-12-10: qty 2

## 2021-12-10 MED ORDER — CEFAZOLIN SODIUM-DEXTROSE 2-4 GM/100ML-% IV SOLN
2.0000 g | Freq: Three times a day (TID) | INTRAVENOUS | Status: AC
  Administered 2021-12-10 (×2): 2 g via INTRAVENOUS
  Filled 2021-12-10 (×2): qty 100

## 2021-12-10 MED ORDER — VANCOMYCIN HCL IN DEXTROSE 1-5 GM/200ML-% IV SOLN
INTRAVENOUS | Status: AC
  Administered 2021-12-10: 1000 mg via INTRAVENOUS
  Filled 2021-12-10: qty 200

## 2021-12-10 MED ORDER — DOCUSATE SODIUM 100 MG PO CAPS
100.0000 mg | ORAL_CAPSULE | Freq: Two times a day (BID) | ORAL | Status: DC
  Administered 2021-12-10 – 2021-12-11 (×2): 100 mg via ORAL
  Filled 2021-12-10 (×2): qty 1

## 2021-12-10 MED ORDER — SODIUM CHLORIDE 0.9 % IV SOLN: INTRAVENOUS | Status: DC

## 2021-12-10 MED ORDER — HYDROCHLOROTHIAZIDE 25 MG PO TABS
25.0000 mg | ORAL_TABLET | Freq: Every day | ORAL | Status: DC
  Administered 2021-12-11: 25 mg via ORAL
  Filled 2021-12-10: qty 1

## 2021-12-10 MED ORDER — PROPOFOL 1000 MG/100ML IV EMUL
INTRAVENOUS | Status: AC
  Filled 2021-12-10: qty 100

## 2021-12-10 MED ORDER — KETOROLAC TROMETHAMINE 15 MG/ML IJ SOLN
7.5000 mg | Freq: Four times a day (QID) | INTRAMUSCULAR | Status: DC
  Administered 2021-12-10 – 2021-12-11 (×3): 7.5 mg via INTRAVENOUS
  Filled 2021-12-10 (×4): qty 1

## 2021-12-10 MED ORDER — AMLODIPINE BESYLATE 10 MG PO TABS
10.0000 mg | ORAL_TABLET | Freq: Every day | ORAL | Status: DC
  Administered 2021-12-11: 10 mg via ORAL
  Filled 2021-12-10: qty 1

## 2021-12-10 SURGICAL SUPPLY — 71 items
BLADE SAGITTAL AGGR TOOTH XLG (BLADE) ×1 IMPLANT
BNDG COHESIVE 6X5 TAN ST LF (GAUZE/BANDAGES/DRESSINGS) ×1 IMPLANT
CHLORAPREP W/TINT 26 (MISCELLANEOUS) ×2 IMPLANT
COVER BACK TABLE REUSABLE LG (DRAPES) ×1 IMPLANT
DERMABOND ADVANCED .7 DNX12 (GAUZE/BANDAGES/DRESSINGS) ×1 IMPLANT
DRAPE 3/4 80X56 (DRAPES) ×1 IMPLANT
DRAPE C-ARM XRAY 36X54 (DRAPES) ×1 IMPLANT
DRAPE INCISE 23X17 IOBAN STRL (DRAPES) ×1
DRAPE INCISE 23X17 STRL (DRAPES) ×1 IMPLANT
DRAPE INCISE IOBAN 23X17 STRL (DRAPES) ×1 IMPLANT
DRAPE INCISE IOBAN 66X60 STRL (DRAPES) ×1 IMPLANT
DRAPE POUCH INSTRU U-SHP 10X18 (DRAPES) ×1 IMPLANT
DRSG MEPILEX SACRM 8.7X9.8 (GAUZE/BANDAGES/DRESSINGS) ×1 IMPLANT
DRSG OPSITE POSTOP 4X10 (GAUZE/BANDAGES/DRESSINGS) IMPLANT
ELECT BLADE 6.5 EXT (BLADE) ×1 IMPLANT
ELECT REM PT RETURN 9FT ADLT (ELECTROSURGICAL) ×1
ELECTRODE REM PT RTRN 9FT ADLT (ELECTROSURGICAL) ×1 IMPLANT
GLOVE SURG ORTHO 8.0 STRL STRW (GLOVE) ×1 IMPLANT
GLOVE SURG SYN 7.5  E (GLOVE) ×4
GLOVE SURG SYN 7.5 E (GLOVE) ×4 IMPLANT
GLOVE SURG SYN 7.5 PF PI (GLOVE) ×2 IMPLANT
GLOVE SURG SYN 8.0 (GLOVE) IMPLANT
GLOVE SURG SYN 8.0 PF PI (GLOVE) ×2 IMPLANT
GLOVE SURG UNDER LTX SZ8 (GLOVE) ×1 IMPLANT
GOWN STRL REUS W/ TWL LRG LVL3 (GOWN DISPOSABLE) ×1 IMPLANT
GOWN STRL REUS W/ TWL XL LVL3 (GOWN DISPOSABLE) ×1 IMPLANT
GOWN STRL REUS W/TWL LRG LVL3 (GOWN DISPOSABLE) ×1
GOWN STRL REUS W/TWL XL LVL3 (GOWN DISPOSABLE) ×1
HEAD CERAMIC FEMORAL 36MM (Head) IMPLANT
HOOD PEEL AWAY FLYTE STAYCOOL (MISCELLANEOUS) ×2 IMPLANT
INSERT 0 DEGREE 36 (Miscellaneous) IMPLANT
LIGHT WAVEGUIDE WIDE FLAT (MISCELLANEOUS) IMPLANT
MANIFOLD NEPTUNE II (INSTRUMENTS) ×1 IMPLANT
MAT ABSORB  FLUID 56X50 GRAY (MISCELLANEOUS) ×1
MAT ABSORB FLUID 56X50 GRAY (MISCELLANEOUS) ×1 IMPLANT
NDL SPNL 20GX3.5 QUINCKE YW (NEEDLE) ×1 IMPLANT
NEEDLE SPNL 20GX3.5 QUINCKE YW (NEEDLE) ×1 IMPLANT
NS IRRIG 1000ML POUR BTL (IV SOLUTION) ×1 IMPLANT
PACK HIP COMPR (MISCELLANEOUS) ×1 IMPLANT
PAD ABD DERMACEA PRESS 5X9 (GAUZE/BANDAGES/DRESSINGS) ×4 IMPLANT
PENCIL SMOKE EVACUATOR COATED (MISCELLANEOUS) ×1 IMPLANT
SCALPEL PROTECTED #10 DISP (BLADE) ×1 IMPLANT
SCREW HEX LP 6.5X15 (Screw) IMPLANT
SCREW HEX LP 6.5X40 (Screw) IMPLANT
SHELL ACETAB 50 D 3H (Shell) IMPLANT
SOLUTION IRRIG SURGIPHOR (IV SOLUTION) ×1 IMPLANT
STEM HIGH OFFSET SZ3 36X101 (Stem) IMPLANT
STRAP SAFETY 5IN WIDE (MISCELLANEOUS) ×1 IMPLANT
SURGIFLO W/THROMBIN 8M KIT (HEMOSTASIS) ×1 IMPLANT
SUT BONE WAX W31G (SUTURE) ×1 IMPLANT
SUT DVC 2 QUILL PDO  T11 36X36 (SUTURE) ×1
SUT DVC 2 QUILL PDO T11 36X36 (SUTURE) ×1 IMPLANT
SUT DVC VLOC 3-0 CL 6 P-12 (SUTURE) ×1 IMPLANT
SUT ETHIBOND 2 V 37 (SUTURE) ×3 IMPLANT
SUT QUILL PDO 2 24X24 VLT (SUTURE) ×1 IMPLANT
SUT SILK 0 (SUTURE) ×1
SUT SILK 0 30XBRD TIE 6 (SUTURE) ×1 IMPLANT
SUT V-LOC 90 ABS DVC 3-0 CL (SUTURE) ×1 IMPLANT
SUT VIC AB 0 CT1 36 (SUTURE) ×1 IMPLANT
SUT VIC AB 1 CT1 18XCR BRD 8 (SUTURE) ×1 IMPLANT
SUT VIC AB 1 CT1 36 (SUTURE) ×1 IMPLANT
SUT VIC AB 1 CT1 8-18 (SUTURE) ×1
SUT VIC AB 2-0 CT2 27 (SUTURE) ×1 IMPLANT
SYR 50ML LL SCALE MARK (SYRINGE) ×1 IMPLANT
SYR BULB IRRIG 60ML STRL (SYRINGE) ×1 IMPLANT
TAPE MICROFOAM 4IN (TAPE) ×1 IMPLANT
TOWEL OR 17X26 4PK STRL BLUE (TOWEL DISPOSABLE) ×1 IMPLANT
TRAP FLUID SMOKE EVACUATOR (MISCELLANEOUS) ×1 IMPLANT
TUBE KAMVAC SUCTION (TUBING) ×1 IMPLANT
WAND WEREWOLF FASTSEAL 6.0 (MISCELLANEOUS) IMPLANT
WATER STERILE IRR 1000ML POUR (IV SOLUTION) ×1 IMPLANT

## 2021-12-10 NOTE — Op Note (Signed)
Patient Name: Natassja Ollis  [id number]  Pre-Operative Diagnosis: Right hip Osteoarthritis  Post-Operative Diagnosis:  Right hip osteoarthritis  Procedure: Right Total Hip Arthroplasty  Components/Implants: Cup 6mm trident II    Liner 77mm D X3  Stem Insignia High Offset size 3  Head 29mm v40 ceramic head  Date of Surgery: 12/10/2021  Surgeon: Steffanie Rainwater MD  Assistant: Wellington RNFA   Anesthesiologist: Loni Dolly  Anesthesia: Spinal   EBL: 200cc  IVF: 347QQ  Complications: None   Brief history: The patient is a 71 year old female with a history of osteoarthritis of the Right hip with pain limiting their range of motion and activities of daily living, which has failed multiple attempts at conservative therapy.  The risks and benefits of total hip arthroplasty as definitive surgical treatment were discussed with the patient, who opted to proceed with the operation.  After outpatient medical clearance and optimization was completed the patient was admitted to [facility] for the procedure.  All preoperative films were reviewed and an appropriate surgical plan was made prior to surgery.   Description of procedure: The patient was brought to the operating room where laterality was confirmed by all those present to be the right side.  The patient was administered spinal anesthesia on a stretcher prior to being moved supine on the operating room table. Patient was given an intravenous dose of antibiotics for surgical prophylaxis and TXA.  All bony prominences and extremities were well padded and the patient was securely attached to the table boots, a perineal post was placed and the patient had a safety strap placed.  Surgical site was prepped with alcohol and chlorhexidine. The surgical site over the hip was and draped in typical sterile fashion with multiple layers of adhesive and nonadhesive drapes.  The incision site was marked out with a sterile  marker and care was taken to assess the position of the ASIS and ensure appropriate position for the incision.    A surgical timeout was then called with participation of all staff in the room the patient was then a confirmed again and laterality confirmed.  Incision was made over the anterior lateral aspect of the proximal thigh in line with the TFL.  Appropriate retractors were placed and all bleeding vessels were coagulated within the subcutaneous and fatty layers.  An incision was made in the TFL fascia in the interval was carefully identified.  The lateral ascending branches of the circumflex vessels were identified, cauterized and carefully dissected.  Retractors were placed around the superior lateral and inferior medial aspects of the femoral neck and a capsulotomy was performed exposing the hip joint.  Retraction stitches were placed and the capsulotomy to assist with visualization.  Femoral neck cut was then made and the femoral head was extracted after placing the leg in traction.  Bone wax was then applied to the proximal cut surface of the femur and aqua mantis was used to address any bleeding around the femoral neck cut.  Retractors were then placed around the acetabulum to fully visualize the joint space, and the remaining labral tissue was removed and pulvinar was removed.   The acetabulum was then sequentially reamed up to the appropriate size in order to get good fit and fill for the acetabular component while under fluoroscopic guidance.  Acetabular component was then placed and malleted into a secure fit while confirming position and abduction angle and anteversion utilizing fluoroscopy.  2 screws were then placed in the acetabular cup to  assist in securing the cup in place.  A trial acetabular polyliner was then placed and attention moved to the femur.  The femur traction was dropped and sequentially externally rotated while performing a release of the posterior and superomedial tissues off  of the proximal femur to allow for mobility, care was taken to preserve the external rotators and piriformis attachments.  The remaining interval between the abductors and the capsule was dissected out and a retractor was placed over the superolateral aspect of the femur over the greater trochanter.  The leg was carefully brought down into extension and adducted to provide visualization of the proximal femur for broaching.  The femur was then sequentially broached up to an appropriate size which provided for good fill and stability to the femoral broach.  A trial neck and head were placed on the femoral broach and the leg was brought up for reduction.  The hip was reduced and manual check of stability was performed with the boot detached from the table.  The hip was found to be stable in flexion internal rotation and extension external rotation.  Leg lengths were confirmed on fluoroscopy utilizing a straight bar across the lesser trochanters.   The hip was then dislocated the trial neck and head were removed.  The trial acetabular liner was removed.  The hip was then irrigated with normal saline and the final poly liner was implanted.  The leg was then brought down into extension and adduction in the proximal femur was reexposed.  The broach trial was removed and the femur was irrigated with normal saline prior to the real femoral stem being implanted.  After the femoral stem was seated and shown to have good fit and fill the appropriate head was impacted the leg was brought up and reduced.  There was good range of motion with stability in flexion internal rotation and extension external rotation on testing.  Leg lengths were found to be appropriate on fluoroscopic evaluation at this time.  The hip was then irrigated with surgiphor betadine based solution and then saline solution.  The capsulotomy was repaired with Ethibond sutures.  A pericapsular and peritrochanteric cocktail with Exparel and bupivacaine was  then injected as well as the subcutaneous tissues.  The fascia was closed with a #2 barbed running suture.  The deep tissues were closed with Vicryl sutures the subcutaneous tissues were closed with interrupted Vicryl sutures and a running barbed 3-0 suture.  The skin was then reinforced with Dermabond and a sterile dressing was placed.   The patient was awoken from anesthesia transferred off of the operating room table onto a hospital bed where examination of leg lengths found the leg lengths to be equal.  The patient was then transferred to the PACU in stable condition.    -Steffanie Rainwater

## 2021-12-10 NOTE — Addendum Note (Signed)
Addended by: James Ivanoff D on: 12/10/2021 12:33 PM   Modules accepted: Orders

## 2021-12-10 NOTE — Evaluation (Signed)
Physical Therapy Evaluation Patient Details Name: [name] MRN: [id number] DOB: [birth year] Today's Date: 12/10/2021  History of Present Illness  Pt admitted for R THR. History includes Aflutter, GERD, and HTN. Pt is POD 0 at time of evaluation.  Clinical Impression  Pt is a pleasant 71 year old female who was admitted for R THR. Pt performs bed mobility, transfers, and ambulation with cga and RW. Educated in Joshua status. Pt demonstrates deficits with strength/mobility/pain. Would benefit from skilled PT to address above deficits and promote optimal return to PLOF. Recommend transition to [facility] upon discharge from acute hospitalization.      Recommendations for follow up therapy are one component of a multi-disciplinary discharge planning process, led by the attending physician.  Recommendations may be updated based on patient status, additional functional criteria and insurance authorization.  Follow Up Recommendations Home health PT      Assistance Recommended at Discharge Set up Supervision/Assistance  Patient can return home with the following  A little help with walking and/or transfers;A little help with bathing/dressing/bathroom;Help with stairs or ramp for entrance    Equipment Recommendations Rolling walker (2 wheels)  Recommendations for Other Services       Functional Status Assessment Patient has had a recent decline in their functional status and demonstrates the ability to make significant improvements in function in a reasonable and predictable amount of time.     Precautions / Restrictions Precautions Precautions: Fall;Anterior Hip Precaution Booklet Issued: No Restrictions Weight Bearing Restrictions: Yes RLE Weight Bearing: Weight bearing as tolerated      Mobility  Bed Mobility Overal bed mobility: Needs Assistance Bed Mobility: Supine to Sit     Supine to sit: Min guard     General bed mobility comments: safe technique with ability to follow  commands easily. Once seated at EOB, upright posture noted    Transfers Overall transfer level: Needs assistance Equipment used: Rolling walker (2 wheels) Transfers: Sit to/from Stand Sit to Stand: Min guard           General transfer comment: cues for sequencing and upright posture. RW used    Ambulation/Gait Ambulation/Gait assistance: Counsellor (Feet): 40 Feet Assistive device: Rolling walker (2 wheels) Gait Pattern/deviations: Step-through pattern       General Gait Details: safe technique with reciprocal gait pattern. RW used. All mobility performed without O2 with sats at 96% on RA  Stairs            Wheelchair Mobility    Modified Rankin (Stroke Patients Only)       Balance Overall balance assessment: Needs assistance Sitting-balance support: Feet supported Sitting balance-Leahy Scale: Good     Standing balance support: Bilateral upper extremity supported Standing balance-Leahy Scale: Good                               Pertinent Vitals/Pain Pain Assessment Pain Assessment: 0-10 Pain Score: 9  Pain Location: R hip Pain Descriptors / Indicators: Operative site guarding Pain Intervention(s): Limited activity within patient's tolerance, Repositioned    Home Living Family/patient expects to be discharged to:: Private residence Living Arrangements: Spouse/significant other;Children Available Help at Discharge: Family Type of Home: House Home Access: Stairs to enter Entrance Stairs-Rails: Can reach both Entrance Stairs-Number of Steps: 4   Home Layout: One level Home Equipment: BSC/3in1      Prior Function Prior Level of Function : Independent/Modified Independent  Mobility Comments: reports she was limping PTA and had recent fall       Hand Dominance        Extremity/Trunk Assessment   Upper Extremity Assessment Upper Extremity Assessment: Overall WFL for tasks assessed    Lower  Extremity Assessment Lower Extremity Assessment: Generalized weakness (R LE grossly 4/5)       Communication   Communication: No difficulties  Cognition Arousal/Alertness: Awake/alert Behavior During Therapy: WFL for tasks assessed/performed Overall Cognitive Status: Within Functional Limits for tasks assessed                                 General Comments: pleasant and eager to progress        General Comments      Exercises     Assessment/Plan    PT Assessment Patient needs continued PT services  PT Problem List Decreased strength;Decreased activity tolerance;Decreased balance;Decreased mobility;Pain       PT Treatment Interventions DME instruction;Gait training;Stair training;Therapeutic exercise    PT Goals (Current goals can be found in the Care Plan section)  Acute Rehab PT Goals Patient Stated Goal: to go home PT Goal Formulation: With patient Time For Goal Achievement: 12/24/21 Potential to Achieve Goals: Good    Frequency BID     Co-evaluation               AM-PAC PT "6 Clicks" Mobility  Outcome Measure Help needed turning from your back to your side while in a flat bed without using bedrails?: A Little Help needed moving from lying on your back to sitting on the side of a flat bed without using bedrails?: A Little Help needed moving to and from a bed to a chair (including a wheelchair)?: A Little Help needed standing up from a chair using your arms (e.g., wheelchair or bedside chair)?: A Little Help needed to walk in hospital room?: A Little Help needed climbing 3-5 steps with a railing? : A Little 6 Click Score: 18    End of Session Equipment Utilized During Treatment: Gait belt Activity Tolerance: Patient limited by pain Patient left: in chair;with chair alarm set Nurse Communication: Mobility status PT Visit Diagnosis: Pain;Muscle weakness (generalized) (M62.81);Difficulty in walking, not elsewhere classified (R26.2) Pain  - Right/Left: Right Pain - part of body: Hip    Time: 1791-5056 PT Time Calculation (min) (ACUTE ONLY): 28 min   Charges:   PT Evaluation $PT Eval Low Complexity: 1 Low PT Treatments $Gait Training: 8-22 mins        Greggory Stallion, PT, DPT, [facility] [phone]   Cash [place]uce 12/10/2021, 4:36 PM

## 2021-12-10 NOTE — Anesthesia Procedure Notes (Signed)
Spinal  Patient location during procedure: OR Start time: 12/10/2021 11:07 AM End time: 12/10/2021 11:09 AM Reason for block: surgical anesthesia Staffing Performed: anesthesiologist  Anesthesiologist: Darrin Nipper, MD Performed by: Darrin Nipper, MD Authorized by: Darrin Nipper, MD   Preanesthetic Checklist Completed: patient identified, IV checked, site marked, risks and benefits discussed, surgical consent, monitors and equipment checked, pre-op evaluation and timeout performed Spinal Block Patient position: sitting Prep: ChloraPrep Patient monitoring: heart rate, cardiac monitor, continuous pulse ox and blood pressure Approach: midline Location: L2-3 Injection technique: single-shot Needle Needle type: Sprotte  Needle gauge: 24 G Needle length: 9 cm Assessment Sensory level: T4 Events: CSF return Additional Notes Straightforward placement without apparent complications.

## 2021-12-10 NOTE — Progress Notes (Signed)
Informed patient's family that we will be moving patient to room 154, via text messaging

## 2021-12-10 NOTE — Anesthesia Postprocedure Evaluation (Signed)
Anesthesia Post Note  Patient: [name]  Procedure(s) Performed: TOTAL HIP ARTHROPLASTY ANTERIOR APPROACH (Right: Hip)  Patient location during evaluation: PACU Anesthesia Type: Spinal Level of consciousness: awake and alert, oriented and patient cooperative Pain management: pain level controlled Vital Signs Assessment: post-procedure vital signs reviewed and stable Respiratory status: spontaneous breathing, nonlabored ventilation and respiratory function stable Cardiovascular status: blood pressure returned to baseline and stable Postop Assessment: adequate PO intake, no headache and spinal receding Anesthetic complications: no   No notable events documented.   Last Vitals:  Vitals:   12/10/21 1418 12/10/21 1426  BP:    Pulse: 62 65  Resp: 19 12  Temp:  36.6 C  SpO2:  99%    Last Pain:  Vitals:   12/10/21 1426  TempSrc:   PainSc: [place]

## 2021-12-10 NOTE — Anesthesia Preprocedure Evaluation (Addendum)
Anesthesia Evaluation  Patient identified by MRN, date of birth, ID band Patient awake    Reviewed: Allergy & Precautions, NPO status , Patient's Chart, lab work & pertinent test results  History of Anesthesia Complications Negative for: history of anesthetic complications  Airway Mallampati: III   Neck ROM: Full    Dental  (+) Poor Dentition   Pulmonary former smoker (quit 2021),    Pulmonary exam normal breath sounds clear to auscultation       Cardiovascular hypertension, +CHF (diastolic)  Normal cardiovascular exam+ dysrhythmias (hx a flutter s/p successful ablation 2017)  Rhythm:Regular Rate:Normal  ECG 12/02/21:  NSR 86bpm, PACs, nonspecific ST/T wave changes, no changes  Echo 08/23/19:  1. Normal left ventricular systolic function with an EF of 55-60% 2. Left ventricular diastolic Doppler parameters consistent with pseudonormalization (G2DD). 3. Left atrium severely dilated 4. Normal right ventricular systolic function 5. Moderately elevated PASP of 46.2 mmHg 6. Mild to moderate mitral valve regurgitation 7. Normal transvalvular gradients; no valvular stenosis 8. No pericardial effusion   Neuro/Psych negative neurological ROS     GI/Hepatic GERD  ,  Endo/Other  Hypothyroidism   Renal/GU negative Renal ROS     Musculoskeletal  (+) Arthritis ,   Abdominal   Peds  Hematology negative hematology ROS (+)   Anesthesia Other Findings Reviewed and agree with Edd Fabian pre-anesthesia clinical review note.   Cardiology note 12/02/21:  Pre-operative cardiac evaluation H/o typical aflutter s/p ablation HTN Mild to mod MR H/o tobacco use Patient is to undergo right hip replacement next week. She reports hip pain for over a year, no known injuries. From a cardiac perspective she is doing well. She denies anginal symptoms. Function is limited due to right hip pain. She is able to do light housework and walk 1-2  blocks. Echo from 2021 showed LVEF 55-60%, G2DD, mild to moderate MR. She is euvolemic on exam today with no signs of volume overload. EKG today showed NSR with PACS. She has a h/o aflutter with prior ablation. She has not been on a/c for many years, this would only be indicated if there is known reoccurrence. Vitals are good today. METS>4. According to RCRI she is Class 2 risk, 6% 30 day risk of death, MI or cardiac arrest. Ok to proceed with surgery without further cardiac work-up.   Disposition: Follow up in 1 year(s) with MD/APP   Reproductive/Obstetrics                            Anesthesia Physical Anesthesia Plan  ASA: 3  Anesthesia Plan: General and Spinal   Post-op Pain Management:    Induction: Intravenous  PONV Risk Score and Plan: 3 and Propofol infusion, TIVA, Treatment may vary due to age or medical condition and Ondansetron  Airway Management Planned: Natural Airway and Nasal Cannula  Additional Equipment:   Intra-op Plan:   Post-operative Plan:   Informed Consent: I have reviewed the patients History and Physical, chart, labs and discussed the procedure including the risks, benefits and alternatives for the proposed anesthesia with the patient or authorized representative who has indicated his/her understanding and acceptance.       Plan Discussed with: CRNA  Anesthesia Plan Comments: (Plan for spinal and GA with natural airway, LMA/GETA backup.  Patient consented for risks of anesthesia including but not limited to:  - adverse reactions to medications - damage to eyes, teeth, lips or other oral mucosa - nerve damage  due to positioning  - sore throat or hoarseness - headache, bleeding, infection, nerve damage 2/2 spinal - damage to heart, brain, nerves, lungs, other parts of body or loss of life  Informed patient about role of CRNA in peri- and intra-operative care.  Patient voiced understanding.)        Anesthesia Quick  Evaluation

## 2021-12-10 NOTE — Interval H&P Note (Signed)
History and Physical Interval Note:  12/10/2021 10:31 AM  [name]  has presented today for surgery, with the diagnosis of Primary osteoarthritis of right hip  M16.11.  The various methods of treatment have been discussed with the patient and family. After consideration of risks, benefits and other options for treatment, the patient has consented to  Procedure(s): TOTAL HIP ARTHROPLASTY ANTERIOR APPROACH (Right) as a surgical intervention.  The patient's history has been reviewed, patient examined, no change in status, stable for surgery.  I have reviewed the patient's chart and labs.  Questions were answered to the patient's satisfaction.     Steffanie Rainwater

## 2021-12-10 NOTE — Transfer of Care (Signed)
Immediate Anesthesia Transfer of Care Note  Patient: [name]  Procedure(s) Performed: TOTAL HIP ARTHROPLASTY ANTERIOR APPROACH (Right: Hip)  Patient Location: PACU  Anesthesia Type:Spinal  Level of Consciousness: awake  Airway & Oxygen Therapy: Patient Spontanous Breathing  Post-op Assessment: Report given to RN, Post -op Vital signs reviewed and stable and Patient moving all extremities  Post vital signs: Reviewed and stable  Last Vitals:  Vitals Value Taken Time  BP 94/60 12/10/21 1336  Temp    Pulse 67 12/10/21 1338  Resp 14 12/10/21 1338  SpO2 94 % 12/10/21 1338  Vitals shown include unvalidated device data.  Last Pain:  Vitals:   12/10/21 0924  TempSrc: Oral  PainSc: 3          Complications: No notable events documented.

## 2021-12-11 ENCOUNTER — Encounter: Payer: Self-pay | Admitting: Orthopedic Surgery

## 2021-12-11 DIAGNOSIS — M1611 Unilateral primary osteoarthritis, right hip: Secondary | ICD-10-CM | POA: Diagnosis not present

## 2021-12-11 LAB — CBC
HCT: 33.9 % — ABNORMAL LOW (ref 36.0–46.0)
Hemoglobin: 10.4 g/dL — ABNORMAL LOW (ref 12.0–15.0)
MCH: 23.2 pg — ABNORMAL LOW (ref 26.0–34.0)
MCHC: 30.7 g/dL (ref 30.0–36.0)
MCV: 75.7 fL — ABNORMAL LOW (ref 80.0–100.0)
Platelets: 238 10*3/uL (ref 150–400)
RBC: 4.48 MIL/uL (ref 3.87–5.11)
RDW: 15.3 % (ref 11.5–15.5)
WBC: 13.2 10*3/uL — ABNORMAL HIGH (ref 4.0–10.5)
nRBC: 0 % (ref 0.0–0.2)

## 2021-12-11 LAB — CREATININE, SERUM
Creatinine, Ser: 0.65 mg/dL (ref 0.44–1.00)
GFR, Estimated: >60 mL/min (ref >60)

## 2021-12-11 MED ORDER — CELECOXIB 200 MG PO CAPS: 200.0000 mg | ORAL_CAPSULE | Freq: Two times a day (BID) | ORAL | 0 refills | Status: AC

## 2021-12-11 MED ORDER — DOCUSATE SODIUM 100 MG PO CAPS: 100.0000 mg | ORAL_CAPSULE | Freq: Two times a day (BID) | ORAL | 0 refills | Status: DC

## 2021-12-11 MED ORDER — TRAMADOL HCL 50 MG PO TABS: 50.0000 mg | ORAL_TABLET | Freq: Four times a day (QID) | ORAL | 0 refills | Status: DC | PRN

## 2021-12-11 MED ORDER — ENOXAPARIN SODIUM 40 MG/0.4ML IJ SOSY: 40.0000 mg | PREFILLED_SYRINGE | INTRAMUSCULAR | 0 refills | Status: DC

## 2021-12-11 NOTE — Progress Notes (Signed)
Spoke with the patient  The patient has daughter and sister in law in the room She has a 3 in 1 at home but needs a rolling walker [employer] to deliver a rolling walker to the room prior to DC Her daughter will provide transport She lives at home with her husband

## 2021-12-11 NOTE — Discharge Instructions (Signed)
Instructions after Anterior Total Hip Replacement        Dr. Zachary Aberman, Jr., M.D.      Dept. of Orthopaedics & Sports Medicine  [facility]  [address]  [place], [place]  [address]  Phone: [phone]   Fax: [phone]    DIET: Drink plenty of non-alcoholic fluids. Resume your normal diet. Include foods high in fiber.  ACTIVITY:  You may use crutches or a walker with weight-bearing as tolerated, unless instructed otherwise. You may be weaned off of the walker or crutches by your Physical Therapist.  Continue doing gentle exercises. Exercising will reduce the pain and swelling, increase motion, and prevent muscle weakness.   Please continue to use the TED compression stockings for 6 weeks. You may remove the stockings at night, but should reapply them in the morning. Do not drive or operate any equipment until instructed.  WOUND CARE:  Continue to use ice packs periodically to reduce pain and swelling. You may shower with honeycomb dressing 3 days after your surgery. Do not submerge incision site under water. Remove honeycomb dressing 7 days after surgery and allow dermabond to fall off on its own.   MEDICATIONS: You may resume your regular medications. Please take the pain medication as prescribed on the medication list. Do not take pain medication on an empty stomach. You have been given a prescription for a blood thinner to prevent blood clots. Please take the medication as instructed. (NOTE: After completing a 2 week course of Lovenox, take one Enteric-coated 81 mg aspirin twice a day.) Pain medications and iron supplements can cause constipation. Use a stool softener (Senokot or Colace) on a daily basis and a laxative (dulcolax or miralax) as needed. Do not drive or drink alcoholic beverages when taking pain medications.  CALL THE OFFICE FOR: Temperature above 101 degrees Excessive bleeding or drainage on the dressing. Excessive swelling, coldness, or  paleness of the toes. Persistent nausea and vomiting.  FOLLOW-UP:  You should have an appointment to return to the office in 2 weeks after surgery. Arrangements have been made for continuation of Physical Therapy (either home therapy or outpatient therapy).  

## 2021-12-11 NOTE — Progress Notes (Signed)
   Subjective: 1 Day Post-Op Procedure(s) (LRB): TOTAL HIP ARTHROPLASTY ANTERIOR APPROACH (Right) Patient reports pain as 2 on 0-10 scale.   Patient is well, and has had no acute complaints or problems.  Patient with nausea yesterday but no nausea today. Denies any CP, SOB, ABD pain. We will continue therapy today.  Plan is to go Home after hospital stay.  Objective: Vital signs in last 24 hours: Temp:  [97.4 F (36.3 C)-97.9 F (36.6 C)] 97.9 F (36.6 C) (09/28 2353) Pulse Rate:  [56-79] 65 (09/28 2353) Resp:  [12-19] 18 (09/28 2353) BP: (94-129)/(59-80) 129/61 (09/28 2353) SpO2:  [95 %-100 %] 95 % (09/28 2353) Weight:  [77.1 kg] 77.1 kg (09/28 0924)  Intake/Output from previous day: 09/28 0701 - 09/29 0700 In: 1187.8 [I.V.:1035.1; IV Piggyback:152.7] Out: 500 [Urine:300; Blood:200] Intake/Output this shift: No intake/output data recorded.  Recent Labs    12/11/21 0631  HGB 10.4*   Recent Labs    12/11/21 0631  WBC 13.2*  RBC 4.48  HCT 33.9*  PLT 238   Recent Labs    12/11/21 0631  CREATININE 0.65   No results for input(s): "LABPT", "INR" in the last 72 hours.  EXAM General - Patient is Alert, Appropriate, and Oriented Extremity - Neurovascular intact Sensation intact distally Intact pulses distally Dorsiflexion/Plantar flexion intact No cellulitis present Compartment soft Dressing - dressing C/D/I, no drainage Motor Function - intact, moving foot and toes well on exam.   Past Medical History:  Diagnosis Date   Anemia    Arthritis    Atrial flutter ([place]) 09/2015   a.) CHADS2VASc 2 (sex, HTN); b.) s/p DCCV 11/11/2015 (150 J); c.) s/p RFCA 01/17/2016; d.) rate/rhythm maintained without pharmacological intervention; no chronic anticoagulation.   Coronary artery calcification seen on CT scan    Diastolic dysfunction 13/10/6576   a.)  TTE 08/23/2019: EF 55-60%, severe LAE, mild-mod MR, PASP 46.2, G2DD   Dyspnea    Essential hypertension    GERD  (gastroesophageal reflux disease)    Hepatic steatosis    Incomplete left bundle branch block (LBBB)    Pneumonia    as a child   Subclinical hypothyroidism    Tobacco abuse     Assessment/Plan:   1 Day Post-Op Procedure(s) (LRB): TOTAL HIP ARTHROPLASTY ANTERIOR APPROACH (Right) Principal Problem:   Osteoarthritis of right hip  Estimated body mass index is 28.29 kg/m as calculated from the following:   Height as of this encounter: 5\' 5"  (1.651 m).   Weight as of this encounter: 77.1 kg. Advance diet Up with therapy Patient doing well.  Pain well controlled. Vital signs are stable Labs are stable Care management to assist with discharge to home with home health PT today pending completion of PT goals  DVT Prophylaxis - Lovenox, TED hose, and SCDs Weight-Bearing as tolerated to right leg   T. Rachelle Hora, PA-C [place] 12/11/2021, 7:32 AM

## 2021-12-11 NOTE — TOC Progression Note (Signed)
Transition of Care [facility]) - Progression Note    Patient Details  Name: [name] MRN: [id number] Date of Birth: October 25, 1950  Transition of Care [facility] CM/SW [place], RN Phone Number: 12/11/2021, 10:14 AM  Clinical Narrative:    The nurse contacted me to let me know the patient will also need a BSC, [employer] to deliver   Expected Discharge Plan: [place] Barriers to Discharge: Barriers Resolved  Expected Discharge Plan and Services Expected Discharge Plan: [place]   Discharge Planning Services: CM Consult   Living arrangements for the past 2 months: Single Family Home                 DME Arranged: Walker rolling DME Agency: [employer] Date DME Agency Contacted: 12/11/21 Time DME Agency Contacted: 1000 Representative spoke with at DME Agency: [place]: PT [place]: [place] ([place]) Date [place]: 12/11/21 Time [place]: 1001 Representative spoke with at [place]: [place] Determinants of Health ([place]) Interventions    Readmission Risk Interventions     No data to display

## 2021-12-11 NOTE — Progress Notes (Signed)
Physical Therapy Treatment Patient Details Name: [name] MRN: [id number] DOB: 1951/02/10 Today's Date: 12/11/2021   History of Present Illness Pt admitted for R THR. History includes Aflutter, GERD, and HTN. Pt is POD 0 at time of evaluation.    PT Comments    Pt seen for PT tx with pt agreeable & eager to participate. Pt's daughters present & participating during session. Pt is able to complete STS with supervision with ongoing min cuing re: safe hand placement. Pt ambulates room<>gym with RW & supervision with improving gait pattern as she progresses. Pt negotiates 4 steps x 2 with B rails & CGA with PT on first attempt but daughter providing assistance on 2nd attempt. Pt & family all feel good about pt d/c home today. MD & team notified of pt's progress.   Recommendations for follow up therapy are one component of a multi-disciplinary discharge planning process, led by the attending physician.  Recommendations may be updated based on patient status, additional functional criteria and insurance authorization.  Follow Up Recommendations  Home health PT     Assistance Recommended at Discharge Set up Supervision/Assistance  Patient can return home with the following A little help with walking and/or transfers;A little help with bathing/dressing/bathroom;Help with stairs or ramp for entrance   Equipment Recommendations  Rolling walker (2 wheels)    Recommendations for Other Services       Precautions / Restrictions Precautions Precautions: Anterior Hip;Fall Restrictions Weight Bearing Restrictions: Yes RLE Weight Bearing: Weight bearing as tolerated     Mobility  Bed Mobility               General bed mobility comments: pt received & left in recliner    Transfers Overall transfer level: Needs assistance Equipment used: Rolling walker (2 wheels) Transfers: Sit to/from Stand Sit to Stand: Supervision           General transfer comment: occasional cuing for  safe hand placement on stable surface during STS    Ambulation/Gait Ambulation/Gait assistance: Supervision Gait Distance (Feet): 150 Feet (+ 150 ft) Assistive device: Rolling walker (2 wheels) Gait Pattern/deviations: Decreased stride length Gait velocity: decreased     General Gait Details: Pt progressing to more step through reciprocal pattern as gait progressed. Pt initially requiring min cuing to ambulate within base of AD with upright posture with good return demo. Education re: need to clear RLE when stepping during turns. Pt with improving heel strike RLE as gait progressed.   Stairs Stairs: Yes Stairs assistance: Min guard Stair Management: Two rails, Step to pattern Number of Stairs: 4 (+ 4) General stair comments: Demo & education re: compensatory pattern with daughters reminding her of pattern during 2nd stair negotiation attempt. Pt negotiates stairs the first time with CGA from PT, 2nd time with family providing CGA with good caregiver positioning & ability to educate pt PRN.   Wheelchair Mobility    Modified Rankin (Stroke Patients Only)       Balance Overall balance assessment: Needs assistance Sitting-balance support: Feet supported Sitting balance-Leahy Scale: Good     Standing balance support: No upper extremity supported, During functional activity Standing balance-Leahy Scale: Fair Standing balance comment: static standing without BUE support with close supervision                            Cognition Arousal/Alertness: Awake/alert Behavior During Therapy: WFL for tasks assessed/performed Overall Cognitive Status: Within Functional Limits for tasks assessed  General Comments: Eager to d/c home.        Exercises      General Comments        Pertinent Vitals/Pain Pain Assessment Pain Assessment: Faces Faces Pain Scale: Hurts little more Pain Location: R hip Pain Descriptors /  Indicators: Aching Pain Intervention(s): Monitored during session, Repositioned    Home Living Family/patient expects to be discharged to:: Private residence Living Arrangements: Spouse/significant other;Children Available Help at Discharge: Family Type of Home: House Home Access: Stairs to enter Entrance Stairs-Rails: Can reach both Entrance Stairs-Number of Steps: 4   Home Layout: One level Home Equipment: BSC/3in1;Cane - single point      Prior Function            PT Goals (current goals can now be found in the care plan section) Acute Rehab PT Goals Patient Stated Goal: to go home PT Goal Formulation: With patient/family Time For Goal Achievement: 12/24/21 Potential to Achieve Goals: Good Additional Goals Additional Goal #1: Pt will be able to perform bed mobility/transfers with mod I and safe technique in order to improve functional independence Progress towards PT goals: Progressing toward goals    Frequency    BID      PT Plan Current plan remains appropriate    Co-evaluation              AM-PAC PT "6 Clicks" Mobility   Outcome Measure  Help needed turning from your back to your side while in a flat bed without using bedrails?: None Help needed moving from lying on your back to sitting on the side of a flat bed without using bedrails?: A Little Help needed moving to and from a bed to a chair (including a wheelchair)?: A Little Help needed standing up from a chair using your arms (e.g., wheelchair or bedside chair)?: A Little Help needed to walk in hospital room?: A Little Help needed climbing 3-5 steps with a railing? : A Little 6 Click Score: 19    End of Session   Activity Tolerance: Patient tolerated treatment well Patient left: in chair;with family/visitor present Nurse Communication: Mobility status PT Visit Diagnosis: Pain;Muscle weakness (generalized) (M62.81);Difficulty in walking, not elsewhere classified (R26.2) Pain - Right/Left:  Right Pain - part of body: Hip     Time: 6387-5643 PT Time Calculation (min) (ACUTE ONLY): 22 min  Charges:  $Gait Training: 8-22 mins                     Lavone Nian, PT, DPT 12/11/21, 12:15 PM    Waunita Schooner 12/11/2021, 12:13 PM

## 2021-12-11 NOTE — Progress Notes (Signed)
Patient is not able to walk the distance required to go the bathroom, or he/she is unable to safely negotiate stairs required to access the bathroom.  A 3in1 BSC will alleviate this problem  

## 2021-12-11 NOTE — Evaluation (Signed)
Occupational Therapy Evaluation Patient Details Name: [name] MRN: [id number] DOB: 10-01-1950 Today's Date: 12/11/2021   History of Present Illness Pt admitted for R THR. History includes Aflutter, GERD, and HTN. Pt is POD 0 at time of evaluation.   Clinical Impression   Patient presenting with decreased independence in self-care, functional mobility, and safety. Patient in recliner upon arrival and agreeable to OT services. Patient in recliner and agreeable to OT services. Patient reports she lives at home with her husband and her daughter will be able to assist as needed. Very supportive family. Patient also reports she has 4 stairs at the entrance of her home, railing on both sides. Has a comfort height toilet, BSC, and SPC. Patient currently functioning at min guard/supervision for transfer sit <> stand and grooming tasks. Patient was able to transfer to elevated toilet seat with supervision/min guard. Patient required min A for LB dressing (assistance with threading on R LE). Patient was also able to ambulate ~290ft around nursing station with min g/ supervision using RW. Patient was educated on WB and hip precautions, and LB dressing techniques; verbalized/ demonstrated understanding. Handout given.  RW recommended. Patient will benefit from acute OT to increase overall independence in the areas of ADLs, functional mobility, in order to safely discharge home.       Recommendations for follow up therapy are one component of a multi-disciplinary discharge planning process, led by the attending physician.  Recommendations may be updated based on patient status, additional functional criteria and insurance authorization.   Follow Up Recommendations  No OT follow up    Assistance Recommended at Discharge Intermittent Supervision/Assistance  Patient can return home with the following A little help with walking and/or transfers;A little help with bathing/dressing/bathroom;Help with stairs or  ramp for entrance;Assistance with cooking/housework;Assist for transportation    Functional Status Assessment  Patient has had a recent decline in their functional status and demonstrates the ability to make significant improvements in function in a reasonable and predictable amount of time.  Equipment Recommendations  Other (comment) (RW)    Recommendations for Other Services       Precautions / Restrictions Precautions Precautions: Anterior Hip;Fall Restrictions Weight Bearing Restrictions: Yes RLE Weight Bearing: Weight bearing as tolerated      Mobility Bed Mobility               General bed mobility comments: Patient in recliner upon arrival.    Transfers Overall transfer level: Needs assistance   Transfers: Sit to/from Stand Sit to Stand: Supervision, Min guard                  Balance Overall balance assessment: Needs assistance Sitting-balance support: Feet supported Sitting balance-Leahy Scale: Good     Standing balance support: Bilateral upper extremity supported, Reliant on assistive device for balance Standing balance-Leahy Scale: Good                             ADL either performed or assessed with clinical judgement   ADL Overall ADL's : Needs assistance/impaired     Grooming: Wash/dry hands;Supervision/safety               Lower Body Dressing: Minimal assistance   Toilet Transfer: Supervision/safety;Min guard                    Pertinent Vitals/Pain Pain Assessment Pain Assessment: 0-10 Pain Score: 4  Pain Descriptors / Indicators: Operative site  guarding, Aching Pain Intervention(s): Repositioned, Premedicated before session, Monitored during session     Extremity/Trunk Assessment Upper Extremity Assessment Upper Extremity Assessment: Overall WFL for tasks assessed   Lower Extremity Assessment Lower Extremity Assessment: Generalized weakness       Communication Communication Communication:  No difficulties   Cognition Arousal/Alertness: Awake/alert Behavior During Therapy: WFL for tasks assessed/performed Overall Cognitive Status: Within Functional Limits for tasks assessed                                 General Comments: pleasant and eager to progress                Home Living Family/patient expects to be discharged to:: Private residence Living Arrangements: Spouse/significant other;Children Available Help at Discharge: Family Type of Home: House Home Access: Stairs to enter Secretary/administrator of Steps: 4 Entrance Stairs-Rails: Can reach both Home Layout: One level     Bathroom Shower/Tub: Chief Strategy Officer: Handicapped height     Home Equipment: BSC/3in1;Cane - single point          Prior Functioning/Environment Prior Level of Function : Independent/Modified Independent             Mobility Comments: reports she was limping PTA and had recent fall          OT Problem List: Decreased strength;Decreased safety awareness;Decreased knowledge of use of DME or AE;Decreased activity tolerance      OT Treatment/Interventions: Self-care/ADL training;Therapeutic exercise;Patient/family education;Therapeutic activities;DME and/or AE instruction    OT Goals(Current goals can be found in the care plan section) Acute Rehab OT Goals Patient Stated Goal: to return home. OT Goal Formulation: With patient/family Time For Goal Achievement: 12/25/21 Potential to Achieve Goals: Good ADL Goals Pt Will Perform Lower Body Bathing: with supervision Pt Will Perform Lower Body Dressing: with supervision Pt Will Transfer to Toilet: with modified independence Pt Will Perform Toileting - Clothing Manipulation and hygiene: with supervision  OT Frequency: Min 2X/week       AM-PAC OT "6 Clicks" Daily Activity     Outcome Measure Help from another person eating meals?: None Help from another person taking care of personal  grooming?: None Help from another person toileting, which includes using toliet, bedpan, or urinal?: A Little Help from another person bathing (including washing, rinsing, drying)?: A Little Help from another person to put on and taking off regular upper body clothing?: None Help from another person to put on and taking off regular lower body clothing?: A Little 6 Click Score: 21   End of Session Equipment Utilized During Treatment: Rolling walker (2 wheels) Nurse Communication: Mobility status  Activity Tolerance: Patient tolerated treatment well Patient left: in bed;with chair alarm set;with call bell/phone within reach;with family/visitor present  OT Visit Diagnosis: Muscle weakness (generalized) (M62.81);Pain Pain - Right/Left: Right Pain - part of body: Hip                Time: 9937-1696 OT Time Calculation (min): 30 min Charges:  OT General Charges $OT Visit: 1 Visit OT Evaluation $OT Eval Low Complexity: 1 Low OT Treatments $Self Care/Home Management : 8-22 mins  Paden Kuras, OTS 12/11/2021, 1:50 PM

## 2021-12-11 NOTE — Plan of Care (Signed)

## 2021-12-11 NOTE — Discharge Summary (Addendum)
Physician Discharge Summary  Patient ID: [name] MRN: [id number] DOB/AGE: 07-09-1950 71 y.o.  Admit date: 12/10/2021 Discharge date: 12/11/2021  Admission Diagnoses:  Osteoarthritis of right hip [M16.11]   Discharge Diagnoses: Patient Active Problem List   Diagnosis Date Noted   Osteoarthritis of right hip 12/10/2021   Adverse drug reaction 06/29/2021   Altered mental status 06/28/2021   Dizziness 06/28/2021   Sinus bradycardia 06/28/2021   Atrial flutter ([place]) 01/16/2016   SOB (shortness of breath)    Hypothyroidism, unspecified    Typical atrial flutter ([place]) 09/24/2015   Essential hypertension 09/23/2015   Tobacco abuse 09/23/2015    Past Medical History:  Diagnosis Date   Anemia    Arthritis    Atrial flutter ([place]) 09/2015   a.) CHADS2VASc 2 (sex, HTN); b.) s/p DCCV 11/11/2015 (150 J); c.) s/p RFCA 01/17/2016; d.) rate/rhythm maintained without pharmacological intervention; no chronic anticoagulation.   Coronary artery calcification seen on CT scan    Diastolic dysfunction 62/69/4854   a.)  TTE 08/23/2019: EF 55-60%, severe LAE, mild-mod MR, PASP 46.2, G2DD   Dyspnea    Essential hypertension    GERD (gastroesophageal reflux disease)    Hepatic steatosis    Incomplete left bundle branch block (LBBB)    Pneumonia    as a child   Subclinical hypothyroidism    Tobacco abuse      Transfusion: none   Consultants (if any):   Discharged Condition: Improved  Hospital Course: [name] is an 71 y.o. female who was admitted 12/10/2021 with a diagnosis of Osteoarthritis of right hip and went to the operating room on 12/10/2021 and underwent the above named procedures.    Surgeries: Procedure(s): TOTAL HIP ARTHROPLASTY ANTERIOR APPROACH on 12/10/2021 Patient tolerated the surgery well. Taken to PACU where she was stabilized and then transferred to the orthopedic floor.  Started on Lovenox 40 mg q 24 hrs. 10 nose and SCDs applied bilaterally. heels  elevated on bed with rolled towels. No evidence of DVT. Negative [place]. Physical therapy started on day #1 for gait training and transfer. OT started day #1 for ADL and assisted devices.  Patient's IV was d/c on day #1.  On post op day #1 patient was stable and ready for discharge to home with HHPT.  Implants: Cup 23mm trident II    Liner 3mm D X3  Stem Insignia High Offset size 3  Head 47mm v40 ceramic head  She was given perioperative antibiotics:  Anti-infectives (From admission, onward)    Start     Dose/Rate Route Frequency Ordered Stop   12/10/21 1600  ceFAZolin (ANCEF) IVPB 2g/100 mL premix        2 g 200 mL/hr over 30 Minutes Intravenous Every 8 hours 12/10/21 1500 12/10/21 2254   12/10/21 0938  ceFAZolin (ANCEF) 2-4 GM/100ML-% IVPB       Note to Pharmacy: Josephina Shih A: cabinet override      12/10/21 0938 12/10/21 1138   12/10/21 0600  ceFAZolin (ANCEF) IVPB 2g/100 mL premix        2 g 200 mL/hr over 30 Minutes Intravenous On call to O.R. 12/09/21 2219 12/10/21 1129   12/09/21 2230  vancomycin (VANCOCIN) IVPB 1000 mg/200 mL premix        1,000 mg 200 mL/hr over 60 Minutes Intravenous  Once 12/09/21 2219 12/10/21 1047     .  She was given sequential compression devices, early ambulation, and Lovenox TEDs for DVT prophylaxis.  She benefited maximally  from the hospital stay and there were no complications.    Recent vital signs:  Vitals:   12/10/21 2353 12/11/21 0822  BP: 129/61 (!) 151/62  Pulse: 65 (!) 58  Resp: 18 17  Temp: 97.9 F (36.6 C) 97.8 F (36.6 C)  SpO2: 95% 100%    Recent laboratory studies:  Lab Results  Component Value Date   HGB 10.4 (L) 12/11/2021   HGB 13.1 12/01/2021   HGB 11.7 (L) 06/28/2021   Lab Results  Component Value Date   WBC 13.2 (H) 12/11/2021   PLT 238 12/11/2021   No results found for: "INR" Lab Results  Component Value Date   NA 138 12/01/2021   K 3.3 (L) 12/01/2021   CL 100 12/01/2021   CO2 28 12/01/2021    BUN 19 12/01/2021   CREATININE 0.65 12/11/2021   GLUCOSE 85 12/01/2021    Discharge Medications:   Allergies as of 12/11/2021       Reactions   Lisinopril Swelling   Swelling of lips and face.    Tizanidine Other (See Comments)   bradycardia   Amoxicillin Itching, Rash   Has patient had a PCN reaction causing immediate rash, facial/tongue/throat swelling, SOB or lightheadedness with hypotension: Yes Has patient had a PCN reaction causing severe rash involving mucus membranes or skin necrosis: No Has patient had a PCN reaction that required hospitalization No Has patient had a PCN reaction occurring within the last 10 years: Yes If all of the above answers are "NO", then may proceed with Cephalosporin use.        Medication List     TAKE these medications    acetaminophen 500 MG tablet Commonly known as: TYLENOL Take 500 mg by mouth every 6 (six) hours as needed for moderate pain or headache.   albuterol 108 (90 Base) MCG/ACT inhaler Commonly known as: VENTOLIN HFA Inhale 2-4 puffs by mouth every 4 hours as needed for wheezing, cough, and/or shortness of breath   amLODipine 10 MG tablet Commonly known as: NORVASC Take 1 tablet (10 mg total) by mouth daily.   ascorbic acid 500 MG tablet Commonly known as: VITAMIN C Take 500 mg by mouth daily.   celecoxib 200 MG capsule Commonly known as: CELEBREX Take 1 capsule (200 mg total) by mouth 2 (two) times daily for 14 days. What changed:  medication strength how much to take when to take this reasons to take this   cholecalciferol 1000 units tablet Commonly known as: VITAMIN D Take 1,000 Units by mouth daily.   cyanocobalamin 500 MCG tablet Commonly known as: VITAMIN B12 Take 500 mcg by mouth daily.   diclofenac Sodium 1 % Gel Commonly known as: VOLTAREN Apply 2 g topically as needed.   docusate sodium 100 MG capsule Commonly known as: COLACE Take 1 capsule (100 mg total) by mouth 2 (two) times daily.    enoxaparin 40 MG/0.4ML injection Commonly known as: LOVENOX Inject 0.4 mLs (40 mg total) into the skin daily for 14 days.   hydrochlorothiazide 25 MG tablet Commonly known as: HYDRODIURIL Take 1 tablet (25 mg total) by mouth daily.   levothyroxine 88 MCG tablet Commonly known as: SYNTHROID Take 88 mcg by mouth daily before breakfast.   losartan 50 MG tablet Commonly known as: COZAAR Take 50 mg by mouth daily.   mupirocin ointment 2 % Commonly known as: BACTROBAN Apply small amount to the inside of both nostrils TWICE a day for the next 5 days.   traMADol 50  MG tablet Commonly known as: ULTRAM Take 1-2 tablets (50-100 mg total) by mouth every 6 (six) hours as needed for moderate pain. No more than 8 tablets in a 24-hour.               Durable Medical Equipment  (From admission, onward)           Start     Ordered   12/11/21 1015  For home use only DME Bedside commode  Once       Question:  Patient needs a bedside commode to treat with the following condition  Answer:  Impaired mobility   12/11/21 1014   12/11/21 1005  For home use only DME Walker rolling  Once       Question Answer Comment  Walker: With 5 Inch Wheels   Patient needs a walker to treat with the following condition Impaired mobility      12/11/21 1005   12/10/21 1501  DME Walker rolling  Once       Question Answer Comment  Walker: With 5 Inch Wheels   Patient needs a walker to treat with the following condition Osteoarthritis of right hip      12/10/21 1500            Diagnostic Studies: DG C-Arm 1-60 Min-No Report  Result Date: 12/10/2021 Fluoroscopy was utilized by the requesting physician.  No radiographic interpretation.   DG HIP UNILAT WITH PELVIS 1V RIGHT  Result Date: 12/10/2021 CLINICAL DATA:  Right hip replacement EXAM: DG HIP (WITH OR WITHOUT PELVIS) 1V RIGHT COMPARISON:  None Available. FINDINGS: Fluoroscopic images were obtained intraoperatively and submitted for post  operative interpretation. Right total hip arthroplasty with hardware in expected position, for images were obtained with 23.3 seconds of fluoroscopy time and 2.48 mGy. Please see the performing provider's procedural report for further detail. IMPRESSION: Intraoperative fluoroscopic guidance for right total hip arthroplasty. Electronically Signed   By: Allegra Lai M.D.   On: 12/10/2021 13:22   DG C-Arm 1-60 Min-No Report  Result Date: 12/10/2021 Fluoroscopy was utilized by the requesting physician.  No radiographic interpretation.    Disposition: Discharge disposition: 06-Home-Health Care Svc          Follow-up Information     Evon Slack, PA-C Follow up in 2 week(s).   Specialties: Orthopedic Surgery, Emergency Medicine Contact information: [address] [place] [address] [phone]                  Signed: Patience Musca 12/11/2021, 12:29 PM

## 2021-12-11 NOTE — Plan of Care (Signed)
  Problem: Activity: Goal: Ability to avoid complications of mobility impairment will improve Outcome: Progressing   Problem: Pain Management: Goal: Pain level will decrease with appropriate interventions Outcome: Progressing   Problem: Skin Integrity: Goal: Will show signs of wound healing Outcome: Progressing   

## 2021-12-14 LAB — SURGICAL PATHOLOGY

## 2022-01-18 ENCOUNTER — Ambulatory Visit: Payer: Medicare Other | Admitting: Medical

## 2022-04-06 ENCOUNTER — Other Ambulatory Visit: Payer: Self-pay | Admitting: Orthopedic Surgery

## 2022-04-06 DIAGNOSIS — Z96641 Presence of right artificial hip joint: Secondary | ICD-10-CM

## 2022-04-12 ENCOUNTER — Ambulatory Visit
Admission: RE | Admit: 2022-04-12 | Discharge: 2022-04-12 | Disposition: A | Discharge status: Home / Self Care | Payer: 59 | Source: Ambulatory Visit | Attending: Orthopedic Surgery | Admitting: Orthopedic Surgery

## 2022-04-12 DIAGNOSIS — Z96641 Presence of right artificial hip joint: Secondary | ICD-10-CM | POA: Insufficient documentation

## 2022-05-12 ENCOUNTER — Other Ambulatory Visit: Payer: Self-pay | Admitting: Nurse Practitioner

## 2022-05-12 DIAGNOSIS — Z780 Asymptomatic menopausal state: Secondary | ICD-10-CM

## 2022-05-12 DIAGNOSIS — Z1231 Encounter for screening mammogram for malignant neoplasm of breast: Secondary | ICD-10-CM

## 2022-07-08 ENCOUNTER — Encounter: Payer: Self-pay | Admitting: Nurse Practitioner

## 2022-07-18 ENCOUNTER — Emergency Department
Admission: EM | Admit: 2022-07-18 | Discharge: 2022-07-18 | Disposition: A | Discharge status: Home / Self Care | Payer: 59 | Attending: Emergency Medicine | Admitting: Emergency Medicine

## 2022-07-18 ENCOUNTER — Other Ambulatory Visit: Payer: Self-pay

## 2022-07-18 DIAGNOSIS — I10 Essential (primary) hypertension: Secondary | ICD-10-CM | POA: Insufficient documentation

## 2022-07-18 DIAGNOSIS — Z1152 Encounter for screening for COVID-19: Secondary | ICD-10-CM | POA: Insufficient documentation

## 2022-07-18 DIAGNOSIS — K529 Noninfective gastroenteritis and colitis, unspecified: Secondary | ICD-10-CM | POA: Diagnosis not present

## 2022-07-18 DIAGNOSIS — R197 Diarrhea, unspecified: Secondary | ICD-10-CM | POA: Diagnosis present

## 2022-07-18 LAB — SARS CORONAVIRUS 2 BY RT PCR: SARS Coronavirus 2 by RT PCR: NEGATIVE

## 2022-07-18 NOTE — Discharge Instructions (Signed)
Follow-up with your regular doctor if not improving in 3 days.  Take over-the-counter Imodium A-D for diarrhea.  Eat a bland diet and drink clear liquids.  May add yogurt sometimes this helps.  Return emergency department if worsening.

## 2022-07-18 NOTE — ED Provider Notes (Signed)
[facility] Provider Note    Event Date/Time   First MD Initiated Contact with Patient 07/18/22 1019     (approximate)   History   Nasal Congestion   HPI  [name] is a 72 y.o. female with history of hypertension, A-fib atrial flutter, presents emergency department with diarrhea that started yesterday.  Also some sinus congestion and drainage.  No fever or chills.  Has not been around anyone else that sick.  No vomiting.  Not tried any over-the-counter medications.      Physical Exam   Triage Vital Signs: ED Triage Vitals  Enc Vitals Group     BP 07/18/22 0949 (!) 190/90     Pulse Rate 07/18/22 0949 62     Resp 07/18/22 0949 16     Temp 07/18/22 0949 98.3 F (36.8 C)     Temp Source 07/18/22 0949 Oral     SpO2 07/18/22 0949 98 %     Weight 07/18/22 0948 170 lb (77.1 kg)     Height 07/18/22 0948 5\' 5"  (1.651 m)     Head Circumference --      Peak Flow --      Pain Score 07/18/22 0947 0     Pain Loc --      Pain Edu? --      Excl. in GC? --     Most recent vital signs: Vitals:   07/18/22 0949  BP: (!) 190/90  Pulse: 62  Resp: 16  Temp: 98.3 F (36.8 C)  SpO2: 98%     General: Awake, no distress.   CV:  Good peripheral perfusion. regular rate and  rhythm Resp:  Normal effort. Lungs cta Abd:  No distention.  Increased bowel sounds throughout the entire abdomen, gurgling noted, abdomen is nontender Other:      ED Results / Procedures / Treatments   Labs (all labs ordered are listed, but only abnormal results are displayed) Labs Reviewed  SARS CORONAVIRUS 2 BY RT PCR     EKG     RADIOLOGY     PROCEDURES:   Procedures   MEDICATIONS ORDERED IN ED: Medications - No data to display   IMPRESSION / MDM / ASSESSMENT AND PLAN / ED COURSE  I reviewed the triage vital signs and the nursing notes.                              Differential diagnosis includes, but is not limited to, COVID, gastroenteritis,  viral illness  Patient's presentation is most consistent with acute illness / injury with system symptoms.   I feel this is a viral infection, is only been going on approximately 24 hours.  Do not feel the patient needs further workup at this time.  She was instructed take Imodium A-D for diarrhea, clear liquids and bland diet.  She could also eat yogurt to help.  I did explain to her not 1 put her on antibiotic for any sinus congestion's only been 24 hours and it would actually make her diarrhea worse.  Could set her up for C. difficile.  Patient is in agreement with treatment plan.  She was discharged stable condition.      FINAL CLINICAL IMPRESSION(S) / ED DIAGNOSES   Final diagnoses:  Acute gastroenteritis     Rx / DC Orders   ED Discharge Orders     None        Note:  This document was prepared using Dragon voice recognition software and may include unintentional dictation errors.    Faythe Ghee, PA-C 07/18/22 1043    Merwyn Katos, MD 07/18/22 [phone]

## 2022-07-18 NOTE — ED Triage Notes (Signed)
Pt c/o congestion and sore throat that started yesterday

## 2022-07-30 ENCOUNTER — Other Ambulatory Visit: Payer: Self-pay | Admitting: Nurse Practitioner

## 2022-07-30 DIAGNOSIS — N63 Unspecified lump in unspecified breast: Secondary | ICD-10-CM

## 2022-08-11 ENCOUNTER — Ambulatory Visit
Admission: RE | Admit: 2022-08-11 | Discharge: 2022-08-11 | Disposition: A | Discharge status: Home / Self Care | Payer: 59 | Source: Ambulatory Visit | Attending: Nurse Practitioner | Admitting: Nurse Practitioner

## 2022-08-11 DIAGNOSIS — N63 Unspecified lump in unspecified breast: Secondary | ICD-10-CM

## 2022-08-11 DIAGNOSIS — R92323 Mammographic fibroglandular density, bilateral breasts: Secondary | ICD-10-CM | POA: Diagnosis not present

## 2022-08-11 DIAGNOSIS — Z1239 Encounter for other screening for malignant neoplasm of breast: Secondary | ICD-10-CM | POA: Insufficient documentation

## 2022-08-11 DIAGNOSIS — N6310 Unspecified lump in the right breast, unspecified quadrant: Secondary | ICD-10-CM | POA: Insufficient documentation

## 2022-09-16 ENCOUNTER — Other Ambulatory Visit: Payer: Self-pay

## 2022-09-16 ENCOUNTER — Emergency Department
Admission: EM | Admit: 2022-09-16 | Discharge: 2022-09-16 | Disposition: A | Discharge status: Home / Self Care | Payer: 59 | Attending: Emergency Medicine | Admitting: Emergency Medicine

## 2022-09-16 ENCOUNTER — Emergency Department: Payer: 59

## 2022-09-16 DIAGNOSIS — R072 Precordial pain: Secondary | ICD-10-CM | POA: Diagnosis present

## 2022-09-16 DIAGNOSIS — F17200 Nicotine dependence, unspecified, uncomplicated: Secondary | ICD-10-CM | POA: Insufficient documentation

## 2022-09-16 DIAGNOSIS — Z79899 Other long term (current) drug therapy: Secondary | ICD-10-CM | POA: Diagnosis not present

## 2022-09-16 DIAGNOSIS — E039 Hypothyroidism, unspecified: Secondary | ICD-10-CM | POA: Insufficient documentation

## 2022-09-16 DIAGNOSIS — E876 Hypokalemia: Secondary | ICD-10-CM | POA: Diagnosis not present

## 2022-09-16 DIAGNOSIS — I4891 Unspecified atrial fibrillation: Secondary | ICD-10-CM | POA: Diagnosis not present

## 2022-09-16 DIAGNOSIS — R079 Chest pain, unspecified: Secondary | ICD-10-CM

## 2022-09-16 DIAGNOSIS — I10 Essential (primary) hypertension: Secondary | ICD-10-CM | POA: Diagnosis not present

## 2022-09-16 DIAGNOSIS — N390 Urinary tract infection, site not specified: Secondary | ICD-10-CM

## 2022-09-16 LAB — URINALYSIS, ROUTINE W REFLEX MICROSCOPIC
Bacteria, UA: NONE SEEN
Bilirubin Urine: NEGATIVE
Glucose, UA: NEGATIVE mg/dL
Hgb urine dipstick: NEGATIVE
Ketones, ur: NEGATIVE mg/dL
Nitrite: NEGATIVE
Protein, ur: NEGATIVE mg/dL
Specific Gravity, Urine: 1.005 (ref 1.005–1.030)
pH: 7 (ref 5.0–8.0)

## 2022-09-16 LAB — CBC WITH DIFFERENTIAL/PLATELET
Abs Immature Granulocytes: 0.02 10*3/uL (ref 0.00–0.07)
Basophils Absolute: 0 10*3/uL (ref 0.0–0.1)
Basophils Relative: 0 %
Eosinophils Absolute: 0 10*3/uL (ref 0.0–0.5)
Eosinophils Relative: 1 %
HCT: 44.1 % (ref 36.0–46.0)
Hemoglobin: 13.4 g/dL (ref 12.0–15.0)
Immature Granulocytes: 0 %
Lymphocytes Relative: 27 %
Lymphs Abs: 1.6 10*3/uL (ref 0.7–4.0)
MCH: 23.1 pg — ABNORMAL LOW (ref 26.0–34.0)
MCHC: 30.4 g/dL (ref 30.0–36.0)
MCV: 75.9 fL — ABNORMAL LOW (ref 80.0–100.0)
Monocytes Absolute: 0.4 10*3/uL (ref 0.1–1.0)
Monocytes Relative: 7 %
Neutro Abs: 4 10*3/uL (ref 1.7–7.7)
Neutrophils Relative %: 65 %
Platelets: 238 10*3/uL (ref 150–400)
RBC: 5.81 MIL/uL — ABNORMAL HIGH (ref 3.87–5.11)
RDW: 15.5 % (ref 11.5–15.5)
WBC: 6.1 10*3/uL (ref 4.0–10.5)
nRBC: 0 % (ref 0.0–0.2)

## 2022-09-16 LAB — COMPREHENSIVE METABOLIC PANEL
ALT: 15 U/L (ref 0–44)
AST: 22 U/L (ref 15–41)
Albumin: 4.7 g/dL (ref 3.5–5.0)
Alkaline Phosphatase: 70 U/L (ref 38–126)
Anion gap: 10 (ref 5–15)
BUN: 16 mg/dL (ref 8–23)
CO2: 25 mmol/L (ref 22–32)
Calcium: 9.1 mg/dL (ref 8.9–10.3)
Chloride: 105 mmol/L (ref 98–111)
Creatinine, Ser: 0.71 mg/dL (ref 0.44–1.00)
GFR, Estimated: >60 mL/min (ref >60)
Glucose, Bld: 133 mg/dL — ABNORMAL HIGH (ref 70–99)
Potassium: 2.8 mmol/L — ABNORMAL LOW (ref 3.5–5.1)
Sodium: 140 mmol/L (ref 135–145)
Total Bilirubin: 0.6 mg/dL (ref 0.3–1.2)
Total Protein: 8.1 g/dL (ref 6.5–8.1)

## 2022-09-16 LAB — TROPONIN I (HIGH SENSITIVITY)
Troponin I (High Sensitivity): 9 ng/L (ref <18)
Troponin I (High Sensitivity): 12 ng/L (ref <18)

## 2022-09-16 LAB — LIPASE, BLOOD: Lipase: 28 U/L (ref 11–51)

## 2022-09-16 LAB — CK: Total CK: 101 U/L (ref 38–234)

## 2022-09-16 LAB — TSH: TSH: 18.742 u[IU]/mL — ABNORMAL HIGH (ref 0.350–4.500)

## 2022-09-16 LAB — T4, FREE: Free T4: 0.78 ng/dL (ref 0.61–1.12)

## 2022-09-16 MED ORDER — POTASSIUM CHLORIDE CRYS ER 20 MEQ PO TBCR
40.0000 meq | EXTENDED_RELEASE_TABLET | Freq: Once | ORAL | Status: AC
  Administered 2022-09-16: 40 meq via ORAL
  Filled 2022-09-16: qty 2

## 2022-09-16 MED ORDER — FOSFOMYCIN TROMETHAMINE 3 G PO PACK
3.0000 g | PACK | Freq: Once | ORAL | Status: AC
  Administered 2022-09-16: 3 g via ORAL
  Filled 2022-09-16: qty 3

## 2022-09-16 NOTE — Discharge Instructions (Signed)
Take your medicines daily as directed by your doctor.  Stay in the air conditioning indoors and drink plenty of fluids this weekend.  Return to the ER for worsening symptoms, persistent vomiting, difficulty breathing or other concerns.

## 2022-09-16 NOTE — ED Provider Notes (Signed)
[facility] Provider Note    Event Date/Time   First MD Initiated Contact with Patient 09/16/22 [phone]     (approximate)   History   Palpitations   HPI  [name] is a 72 y.o. female brought to the ED via EMS from home with a chief complaint of palpitations.  Patient reports she was at rest 1 hour prior to arrival when she experienced burning sensation to her epigastrium/substernal chest.  History of similar with atrial fibrillation status post ablation in 2016 with no issues, not on anticoagulation.  EMS reports atrial fibrillation with heart rate 150-160 which converted to normal sinus rhythm with controlled rate after they placed an IV.  Thus no medications given.  Patient arrives feeling significantly better, right 2/10 burning pain in her chest.  Does admit she ate a lot of spicy hot sauce tonight.  Denies recent fever/chills, cough, shortness of breath, abdominal pain, nausea, vomiting or dizziness.     Past Medical History   Past Medical History:  Diagnosis Date   Anemia    Arthritis    Atrial flutter (HCC) 09/2015   a.) CHADS2VASc 2 (sex, HTN); b.) s/p DCCV 11/11/2015 (150 J); c.) s/p RFCA 01/17/2016; d.) rate/rhythm maintained without pharmacological intervention; no chronic anticoagulation.   Coronary artery calcification seen on CT scan    Diastolic dysfunction 08/23/2019   a.)  TTE 08/23/2019: EF 55-60%, severe LAE, mild-mod MR, PASP 46.2, G2DD   Dyspnea    Essential hypertension    GERD (gastroesophageal reflux disease)    Hepatic steatosis    Incomplete left bundle branch block (LBBB)    Pneumonia    as a child   Subclinical hypothyroidism    Tobacco abuse      Active Problem List   Patient Active Problem List   Diagnosis Date Noted   Osteoarthritis of right hip 12/10/2021   Adverse drug reaction 06/29/2021   Altered mental status 06/28/2021   Dizziness 06/28/2021   Sinus bradycardia 06/28/2021   Atrial flutter (HCC)  01/16/2016   SOB (shortness of breath)    Hypothyroidism, unspecified    Typical atrial flutter (HCC) 09/24/2015   Essential hypertension 09/23/2015   Tobacco abuse 09/23/2015     Past Surgical History   Past Surgical History:  Procedure Laterality Date   ABDOMINAL HYSTERECTOMY     ABLATION OF DYSRHYTHMIC FOCUS  01/16/2016   ELECTROPHYSIOLOGIC STUDY N/A 11/11/2015   Procedure: CARDIOVERSION;  Surgeon: Antonieta Iba, MD;  Location: [facility] ORS;  Service: Cardiovascular;  Laterality: N/A;   ELECTROPHYSIOLOGIC STUDY N/A 01/16/2016   Procedure: A-Flutter Ablation;  Surgeon: Duke Salvia, MD;  Location: [facility];  Service: Cardiovascular;  Laterality: N/A;   TOTAL HIP ARTHROPLASTY Right 12/10/2021   Procedure: TOTAL HIP ARTHROPLASTY ANTERIOR APPROACH;  Surgeon: Reinaldo Berber, MD;  Location: [facility] ORS;  Service: Orthopedics;  Laterality: Right;     Home Medications   Prior to Admission medications   Medication Sig Start Date End Date Taking? Authorizing Provider  acetaminophen (TYLENOL) 500 MG tablet Take 500 mg by mouth every 6 (six) hours as needed for moderate pain or headache.    [provider]  albuterol (VENTOLIN HFA) 108 (90 Base) MCG/ACT inhaler Inhale 2-4 puffs by mouth every 4 hours as needed for wheezing, cough, and/or shortness of breath 02/24/19   Loleta Rose, MD  amLODipine (NORVASC) 10 MG tablet Take 1 tablet (10 mg total) by mouth daily. 03/24/18 09/04/28  Cuthriell, Delorise Royals,  PA-C  ascorbic acid (VITAMIN C) 500 MG tablet Take 500 mg by mouth daily.    [provider]  cholecalciferol (VITAMIN D) 1000 units tablet Take 1,000 Units by mouth daily.    [provider]  diclofenac Sodium (VOLTAREN) 1 % GEL Apply 2 g topically as needed. 05/01/21   [provider]  docusate sodium (COLACE) 100 MG capsule Take 1 capsule (100 mg total) by mouth 2 (two) times daily. 12/11/21   Evon Slack, PA-C  enoxaparin (LOVENOX) 40 MG/0.4ML  injection Inject 0.4 mLs (40 mg total) into the skin daily for 14 days. 12/11/21 12/25/21  Evon Slack, PA-C  hydrochlorothiazide (HYDRODIURIL) 25 MG tablet Take 1 tablet (25 mg total) by mouth daily. 01/09/20   Marisue Ivan D, PA-C  levothyroxine (SYNTHROID, LEVOTHROID) 88 MCG tablet Take 88 mcg by mouth daily before breakfast.  10/24/15 09/04/28  [provider]  losartan (COZAAR) 50 MG tablet Take 50 mg by mouth daily. 09/26/21   [provider]  mupirocin ointment (BACTROBAN) 2 % Apply small amount to the inside of both nostrils TWICE a day for the next 5 days. 12/03/21   Verlee Monte, NP  traMADol (ULTRAM) 50 MG tablet Take 1-2 tablets (50-100 mg total) by mouth every 6 (six) hours as needed for moderate pain. No more than 8 tablets in a 24-hour. 12/11/21   Evon Slack, PA-C  vitamin B-12 (CYANOCOBALAMIN) 500 MCG tablet Take 500 mcg by mouth daily.    [provider]     Allergies  Lisinopril, Tizanidine, and Amoxicillin   Family History   Family History  Problem Relation Age of Onset   Hypertension Mother    Diabetes Mother    Diabetes Father    Diabetes Brother    Hypertension Brother    Hypertension Brother    Heart attack Brother    Diabetes Brother    Breast cancer Neg Hx      Physical Exam  Triage Vital Signs: ED Triage Vitals  Enc Vitals Group     BP      Pulse      Resp      Temp      Temp src      SpO2      Weight      Height      Head Circumference      Peak Flow      Pain Score      Pain Loc      Pain Edu?      Excl. in GC?     Updated Vital Signs: BP (!) 152/87   Pulse (!) 45   Temp 98.5 F (36.9 C) (Oral)   Resp 19   Wt 78.8 kg   SpO2 99%   BMI 28.92 kg/m    General: Awake, no distress.  CV:  RRR.  Good peripheral perfusion.  Resp:  Normal effort.  CTAB. Abd:  Nontender.  No distention.  Other:  No thyromegaly.   ED Results / Procedures / Treatments  Labs (all labs ordered are listed, but  only abnormal results are displayed) Labs Reviewed  CBC WITH DIFFERENTIAL/PLATELET - Abnormal; Notable for the following components:      Result Value   RBC 5.81 (*)    MCV 75.9 (*)    MCH 23.1 (*)    All other components within normal limits  COMPREHENSIVE METABOLIC PANEL - Abnormal; Notable for the following components:   Potassium 2.8 (*)  Glucose, Bld 133 (*)    All other components within normal limits  URINALYSIS, ROUTINE W REFLEX MICROSCOPIC - Abnormal; Notable for the following components:   Color, Urine STRAW (*)    APPearance CLEAR (*)    Leukocytes,Ua SMALL (*)    All other components within normal limits  TSH - Abnormal; Notable for the following components:   TSH 18.742 (*)    All other components within normal limits  LIPASE, BLOOD  CK  T4, FREE  TROPONIN I (HIGH SENSITIVITY)  TROPONIN I (HIGH SENSITIVITY)     EKG  ED ECG REPORT I, Lyzbeth Genrich J, the attending physician, personally viewed and interpreted this ECG.   Date: 09/16/2022  EKG Time: 0031  Rate: 75  Rhythm: normal sinus rhythm  Axis: Normal  Intervals:nonspecific intraventricular conduction delay  ST&T Change: Nonspecific    RADIOLOGY I have independently visualized and interpreted patient's chest x-ray as well as noted the radiology interpretation:  X-ray: No acute cardiopulmonary process  Official radiology report(s): DG Chest Port 1 View  Result Date: 09/16/2022 CLINICAL DATA:  Chest pain EXAM: PORTABLE CHEST 1 VIEW COMPARISON:  06/28/2021 FINDINGS: Cardiac shadow is enlarged. Aortic calcifications are again seen. Lungs are well aerated bilaterally. No focal infiltrate or effusion is noted. No bony abnormality is seen. IMPRESSION: No active disease. Electronically Signed   By: Alcide Clever M.D.   On: 09/16/2022 01:51     PROCEDURES:  Critical Care performed: No  .1-3 Lead EKG Interpretation  Performed by: Irean Hong, MD Authorized by: Irean Hong, MD     Interpretation: normal      ECG rate:  75   ECG rate assessment: normal     Rhythm: sinus rhythm     Ectopy: none     Conduction: normal   Comments:     Patient placed on cardiac monitor to evaluate for arrhythmias    MEDICATIONS ORDERED IN ED: Medications  potassium chloride SA (KLOR-CON M) CR tablet 40 mEq (40 mEq Oral Given 09/16/22 0138)  fosfomycin (MONUROL) packet 3 g (3 g Oral Given 09/16/22 0141)     IMPRESSION / MDM / ASSESSMENT AND PLAN / ED COURSE  I reviewed the triage vital signs and the nursing notes.                             72 year old female presenting with chest/epigastric burning, atrial fibrillation with RVR now resolved and in normal sinus rhythm without intervention. Differential diagnosis includes, but is not limited to, ACS, aortic dissection, pulmonary embolism, cardiac tamponade, pneumothorax, pneumonia, pericarditis, myocarditis, GI-related causes including esophagitis/gastritis, and musculoskeletal chest wall pain.   I personally reviewed patient's records and notes a orthopedic surgery office visit on 04/07/2022 status post right THR.  Patient's presentation is most consistent with acute presentation with potential threat to life or bodily function.  The patient is on the cardiac monitor to evaluate for evidence of arrhythmia and/or significant heart rate changes.  Will obtain cardiac panel, add LFTs/lipase, chest x-ray.  Patient currently voices no complaints.  Will reassess.  Clinical Course as of 09/16/22 0430  Thu Sep 16, 2022  0212 Laboratory results remarkable for hypokalemia, small leukocyte positive UTI, elevated TSH.  Fosfomycin and potassium ordered.  Awaiting repeat troponin.  Patient resting in no acute distress, voices no medical complaints. [JS]  0430 Patient resting in no acute distress.  Repeat troponin remains negative.  No recurrence of atrial fibrillation with  uncontrolled rates.  Patient will follow-up closely with her cardiologist.  Strict return precautions  given.  Patient and family member verbalized understanding and agreed with plan of care. [JS]    Clinical Course User Index [JS] Irean Hong, MD     FINAL CLINICAL IMPRESSION(S) / ED DIAGNOSES   Final diagnoses:  Atrial fibrillation with rapid ventricular response (HCC)  Urinary tract infection without hematuria, site unspecified  Hypokalemia  Chest pain, unspecified type     Rx / DC Orders   ED Discharge Orders     None        Note:  This document was prepared using Dragon voice recognition software and may include unintentional dictation errors.   Irean Hong, MD 09/16/22 [phone]

## 2022-09-16 NOTE — ED Triage Notes (Signed)
BIB EMS/ pt experiencing palpitations 1 Hr PTA/ hx of a-fib, had ablation in 2016 w/ no issues since/ HR 150-160 en route/ pt converted to NSR upon arrival/ pt feeling better/ 2/10 burning pain in chest

## 2022-09-20 ENCOUNTER — Emergency Department: Payer: 59

## 2022-09-20 ENCOUNTER — Encounter: Payer: Self-pay | Admitting: Emergency Medicine

## 2022-09-20 ENCOUNTER — Other Ambulatory Visit: Payer: Self-pay

## 2022-09-20 ENCOUNTER — Emergency Department
Admission: EM | Admit: 2022-09-20 | Discharge: 2022-09-20 | Disposition: A | Discharge status: Home / Self Care | Payer: 59 | Attending: Emergency Medicine | Admitting: Emergency Medicine

## 2022-09-20 DIAGNOSIS — R001 Bradycardia, unspecified: Secondary | ICD-10-CM

## 2022-09-20 DIAGNOSIS — E039 Hypothyroidism, unspecified: Secondary | ICD-10-CM

## 2022-09-20 DIAGNOSIS — I10 Essential (primary) hypertension: Secondary | ICD-10-CM | POA: Insufficient documentation

## 2022-09-20 LAB — BASIC METABOLIC PANEL
Anion gap: 10 (ref 5–15)
BUN: 13 mg/dL (ref 8–23)
CO2: 25 mmol/L (ref 22–32)
Calcium: 9 mg/dL (ref 8.9–10.3)
Chloride: 104 mmol/L (ref 98–111)
Creatinine, Ser: 0.77 mg/dL (ref 0.44–1.00)
GFR, Estimated: >60 mL/min (ref >60)
Glucose, Bld: 152 mg/dL — ABNORMAL HIGH (ref 70–99)
Potassium: 3 mmol/L — ABNORMAL LOW (ref 3.5–5.1)
Sodium: 139 mmol/L (ref 135–145)

## 2022-09-20 LAB — TROPONIN I (HIGH SENSITIVITY): Troponin I (High Sensitivity): 8 ng/L (ref <18)

## 2022-09-20 LAB — CBC
HCT: 41.7 % (ref 36.0–46.0)
Hemoglobin: 12.5 g/dL (ref 12.0–15.0)
MCH: 23.1 pg — ABNORMAL LOW (ref 26.0–34.0)
MCHC: 30 g/dL (ref 30.0–36.0)
MCV: 76.9 fL — ABNORMAL LOW (ref 80.0–100.0)
Platelets: 253 10*3/uL (ref 150–400)
RBC: 5.42 MIL/uL — ABNORMAL HIGH (ref 3.87–5.11)
RDW: 15.1 % (ref 11.5–15.5)
WBC: 6.6 10*3/uL (ref 4.0–10.5)
nRBC: 0 % (ref 0.0–0.2)

## 2022-09-20 LAB — MAGNESIUM: Magnesium: 2.1 mg/dL (ref 1.7–2.4)

## 2022-09-20 MED ORDER — POTASSIUM CHLORIDE CRYS ER 20 MEQ PO TBCR
40.0000 meq | EXTENDED_RELEASE_TABLET | Freq: Once | ORAL | Status: AC
  Administered 2022-09-20: 40 meq via ORAL
  Filled 2022-09-20: qty 2

## 2022-09-20 NOTE — ED Triage Notes (Signed)
Pt comes with c/o bradycardia. Pt states she was here last time and her HR was high. Pt states she went to her doc for wellness check. Pt has EKG performed and was sent here for further evaluation.   Pt states some burning in chest at times.

## 2022-09-20 NOTE — ED Notes (Signed)
See triage note  Presents with some issues with her heart rate  States she felt like it was fast  but on arrival her rate is slowed down  States she had some  epigastric burning last pm

## 2022-09-20 NOTE — ED Provider Notes (Signed)
[facility] Provider Note    Event Date/Time   First MD Initiated Contact with Patient 09/20/22 1237     (approximate)   History   Chief Complaint Bradycardia   HPI  [name] is a 72 y.o. female with past medical history of hypertension and atrial fibrillation status post ablation in 2017 who presents to the ED complaining of bradycardia.  Patient reports that she went to her PCPs office for routine follow-up earlier today and was found to have a low heart rate in the 40s.  She was subsequently referred to the ED for further evaluation.  She complains only of burning discomfort starting in her epigastrium and moving up into her chest, describes this as similar to prior episodes of heartburn.  She has not had any fever, cough, difficulty breathing, dizziness, lightheadedness, or near syncope.  She was seen in the ED for episode of atrial fibrillation with RVR 4 days ago that resolved on its own.  She states she does not take any rate controlling medications or blood thinners for her A-fib.  She has not seen her cardiologist in a couple of years but has an appointment later this week.     Physical Exam   Triage Vital Signs: ED Triage Vitals  Enc Vitals Group     BP 09/20/22 1204 (!) 161/66     Pulse Rate 09/20/22 1204 (!) 49     Resp 09/20/22 1204 17     Temp 09/20/22 1204 98 F (36.7 C)     Temp src --      SpO2 09/20/22 1204 100 %     Weight --      Height --      Head Circumference --      Peak Flow --      Pain Score 09/20/22 1203 0     Pain Loc --      Pain Edu? --      Excl. in GC? --     Most recent vital signs: Vitals:   09/20/22 1204 09/20/22 1345  BP: (!) 161/66 (!) 150/68  Pulse: (!) 49 (!) 52  Resp: 17 16  Temp: 98 F (36.7 C)   SpO2: 100% 100%    Constitutional: Alert and oriented. Eyes: Conjunctivae are normal. Head: Atraumatic. Nose: No congestion/rhinnorhea. Mouth/Throat: Mucous membranes are moist.   Cardiovascular: Bradycardic, regular rhythm. Grossly normal heart sounds.  2+ radial pulses bilaterally. Respiratory: Normal respiratory effort.  No retractions. Lungs CTAB. Gastrointestinal: Soft and nontender. No distention. Musculoskeletal: No lower extremity tenderness nor edema.  Neurologic:  Normal speech and language. No gross focal neurologic deficits are appreciated.    ED Results / Procedures / Treatments   Labs (all labs ordered are listed, but only abnormal results are displayed) Labs Reviewed  BASIC METABOLIC PANEL - Abnormal; Notable for the following components:      Result Value   Potassium 3.0 (*)    Glucose, Bld 152 (*)    All other components within normal limits  CBC - Abnormal; Notable for the following components:   RBC 5.42 (*)    MCV 76.9 (*)    MCH 23.1 (*)    All other components within normal limits  MAGNESIUM  TROPONIN I (HIGH SENSITIVITY)     EKG  ED ECG REPORT I, Chesley Noon, the attending physician, personally viewed and interpreted this ECG.   Date: 09/20/2022  EKG Time: 12:09  Rate: 46  Rhythm: sinus bradycardia, occasional PVC  Axis: Normal  Intervals:none  ST&T Change: None  RADIOLOGY Chest x-ray reviewed and interpreted by me with no infiltrate, edema, or effusion.  PROCEDURES:  Critical Care performed: No  Procedures   MEDICATIONS ORDERED IN ED: Medications  potassium chloride SA (KLOR-CON M) CR tablet 40 mEq (40 mEq Oral Given 09/20/22 1306)     IMPRESSION / MDM / ASSESSMENT AND PLAN / ED COURSE  I reviewed the triage vital signs and the nursing notes.                              72 y.o. female with past medical history of hypertension and atrial fibrillation who presents to the ED complaining of low heart rate noted at routine follow-up with her PCP.  Patient's presentation is most consistent with acute presentation with potential threat to life or bodily function.  Differential diagnosis includes, but is not  limited to, arrhythmia, electrolyte abnormality, AKI, hypothyroidism, ACS.  Patient well-appearing and in no acute distress, vital signs remarkable for bradycardia but otherwise reassuring.  EKG shows sinus bradycardia with no ischemic changes, patient minimally symptomatic with this and complains only of burning discomfort in her chest that seems consistent with GERD.  Troponin within normal limits and I doubt ACS, no significant anemia or leukocytosis noted.  She does have mild hypokalemia that we will replete, add on magnesium level.  No significant AKI noted.  Her previous labs show elevated TSH but free T4 within normal limits, patient states that her PCP recently increased her levothyroxine dose.  While hypothyroidism could be contributing, we will hold off on any additional changes to her levothyroxine at this time.  Plan to observe on cardiac monitor and reassess.  Patient with sinus bradycardia on monitor, rate varying from mid to high 40s.  Magnesium level within normal limits and patient remains minimally symptomatic with her bradycardia.  Case discussed with Dr. Kirke Corin of cardiology, who agrees with plan for previously scheduled outpatient follow-up in 3 days.  Patient counseled to follow-up with PCP for recheck of her thyroid function and to return to the ED for new or worsening symptoms.  Patient agrees with plan.      FINAL CLINICAL IMPRESSION(S) / ED DIAGNOSES   Final diagnoses:  Sinus bradycardia  Hypothyroidism, unspecified type     Rx / DC Orders   ED Discharge Orders     None        Note:  This document was prepared using Dragon voice recognition software and may include unintentional dictation errors.   Chesley Noon, MD 09/20/22 [phone]

## 2022-09-23 ENCOUNTER — Ambulatory Visit: Payer: 59 | Attending: Medical | Admitting: Medical

## 2022-09-23 ENCOUNTER — Ambulatory Visit (INDEPENDENT_AMBULATORY_CARE_PROVIDER_SITE_OTHER): Payer: 59

## 2022-09-23 ENCOUNTER — Encounter: Payer: Self-pay | Admitting: Medical

## 2022-09-23 ENCOUNTER — Other Ambulatory Visit
Admission: RE | Admit: 2022-09-23 | Discharge: 2022-09-23 | Disposition: A | Discharge status: Home / Self Care | Payer: 59 | Source: Ambulatory Visit | Attending: Medical | Admitting: Medical

## 2022-09-23 VITALS — BP 142/70 | HR 53 | Ht 65.0 in | Wt 169.0 lb

## 2022-09-23 DIAGNOSIS — R001 Bradycardia, unspecified: Secondary | ICD-10-CM

## 2022-09-23 DIAGNOSIS — Z79899 Other long term (current) drug therapy: Secondary | ICD-10-CM | POA: Insufficient documentation

## 2022-09-23 DIAGNOSIS — I483 Typical atrial flutter: Secondary | ICD-10-CM

## 2022-09-23 DIAGNOSIS — E876 Hypokalemia: Secondary | ICD-10-CM

## 2022-09-23 DIAGNOSIS — I480 Paroxysmal atrial fibrillation: Secondary | ICD-10-CM

## 2022-09-23 DIAGNOSIS — I10 Essential (primary) hypertension: Secondary | ICD-10-CM

## 2022-09-23 DIAGNOSIS — E039 Hypothyroidism, unspecified: Secondary | ICD-10-CM

## 2022-09-23 LAB — BASIC METABOLIC PANEL
Anion gap: 10 (ref 5–15)
BUN: 8 mg/dL (ref 8–23)
CO2: 30 mmol/L (ref 22–32)
Calcium: 9.6 mg/dL (ref 8.9–10.3)
Chloride: 104 mmol/L (ref 98–111)
Creatinine, Ser: 0.67 mg/dL (ref 0.44–1.00)
GFR, Estimated: >60 mL/min (ref >60)
Glucose, Bld: 92 mg/dL (ref 70–99)
Potassium: 3.5 mmol/L (ref 3.5–5.1)
Sodium: 144 mmol/L (ref 135–145)

## 2022-09-23 MED ORDER — LOSARTAN POTASSIUM 100 MG PO TABS: 100.0000 mg | ORAL_TABLET | Freq: Every day | ORAL | 3 refills | Status: AC

## 2022-09-23 NOTE — Patient Instructions (Signed)
Medication Instructions:  Your physician recommends the following medication changes.  STOP TAKING: Hydrochlorothiazide   INCREASE: Losartan to 100 mg by mouth daily  *If you need a refill on your cardiac medications before your next appointment, please call your pharmacy*   Lab Work: Your provider would like for you to have following labs drawn today (BMP), then return in 2 weeks for (BMP)   Please go to the [facility] entrance and check in at the front desk.  You do not need an appointment.  They are open from 7am-6 pm.     Testing/Procedures: Your physician has requested that you have an echocardiogram. Echocardiography is a painless test that uses sound waves to create images of your heart. It provides your doctor with information about the size and shape of your heart and how well your heart's chambers and valves are working.   You may receive an ultrasound enhancing agent through an IV if needed to better visualize your heart during the echo. This procedure takes approximately one hour.  There are no restrictions for this procedure.  This will take place at [address] [address] [facility], [place] [address]  Heart Monitor:  Length of Wear: 7 days  Your monitor will be mailed to your home address within 3-5 business days. However, if you have not received your monitor after 5 business days please send [place] a MyChart message or call the office at [phone], so we may follow up on this for you.   Your physician has recommended that you wear a Zio XT monitor.   This monitor is a medical device that records the heart's electrical activity. Doctors most often use these monitors to diagnose arrhythmias. Arrhythmias are problems with the speed or rhythm of the heartbeat. The monitor is a small device applied to your chest. You can wear one while you do your normal daily activities. While wearing this monitor if you have any symptoms to push the button and  record what you felt. Once you have worn this monitor for the period of time provider prescribed (Usually 14 days), you will return the monitor device in the postage paid box. Once it is returned they will download the data collected and provide [place] with a report which the provider will then review and we will call you with those results. Important tips:  Avoid showering during the first 24 hours of wearing the monitor. Avoid excessive sweating to help maximize wear time. Do not submerge the device, no hot tubs, and no swimming pools. Keep any lotions or oils away from the patch. After 24 hours you may shower with the patch on. Take brief showers with your back facing the shower head.  Do not remove patch once it has been placed because that will interrupt data and decrease adhesive wear time. Push the button when you have any symptoms and write down what you were feeling. Once you have completed wearing your monitor, remove and place into box which has postage paid and place in your outgoing mailbox.  If for some reason you have misplaced your box then call our office and we can provide another box and/or mail it off for you.        Follow-Up: At [facility], you and your health needs are our priority.  As part of our continuing mission to provide you with exceptional heart care, we have created designated Provider Care Teams.  These Care Teams include your primary Cardiologist (physician) and  Advanced Practice Providers (APPs -  Physician Assistants and Nurse Practitioners) who all work together to provide you with the care you need, when you need it.  We recommend signing up for the patient portal called "MyChart".  Sign up information is provided on this After Visit Summary.  MyChart is used to connect with patients for Virtual Visits (Telemedicine).  Patients are able to view lab/test results, encounter notes, upcoming appointments, etc.  Non-urgent messages can be sent to your  provider as well.   To learn more about what you can do with MyChart, go to [email or web address].    Your next appointment:   4-6 week(s)  Provider:   Terrilee Croak, PA-C
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
## 2022-09-25 DIAGNOSIS — I483 Typical atrial flutter: Secondary | ICD-10-CM | POA: Diagnosis not present

## 2022-09-27 ENCOUNTER — Other Ambulatory Visit: Payer: Self-pay | Admitting: Cardiology

## 2022-09-27 ENCOUNTER — Telehealth: Payer: Self-pay | Admitting: Cardiology

## 2022-09-27 ENCOUNTER — Telehealth: Payer: Self-pay | Admitting: Cardiovascular Disease

## 2022-09-27 MED ORDER — HYDROXYZINE HCL 10 MG PO TABS: 10.0000 mg | ORAL_TABLET | Freq: Three times a day (TID) | ORAL | 0 refills | Status: DC | PRN

## 2022-09-27 NOTE — Telephone Encounter (Signed)
Left message for the patient to call clinic.

## 2022-09-27 NOTE — Telephone Encounter (Signed)
Pt called with intense itching at site of event monitor.  Benadryl did not help.  She has taken cool showers today.   No SOB,  + mild rash.  I told her if continued to take the monitor off.  I will send note to [facility] to see if there is non allergic tape pt could use.     I sent script for atarax with instructions not to drive after taking.      Shelly if you have any other recommendations please help!  Thanks.

## 2022-09-27 NOTE — Telephone Encounter (Signed)
Patient stated she has developed a rash and is very itchy at the adhesive site of her heart monitor.  Patient wants to know next steps.

## 2022-09-28 ENCOUNTER — Telehealth: Payer: Self-pay | Admitting: Cardiovascular Disease

## 2022-09-28 NOTE — Telephone Encounter (Signed)
Spoke to patient and she stated she is on her way to pick up some medication to help with the rash/itching. Patient stated that if that doesn't help alleviate the itching she will mail back the monitor as is.

## 2022-09-28 NOTE — Telephone Encounter (Signed)
Pt calling back per phone note from 7/15 stating the rash is keeping her up and she is not getting any sleep. Please advise.

## 2022-09-28 NOTE — Telephone Encounter (Signed)
See encounter from 09/28/22

## 2022-10-01 NOTE — Telephone Encounter (Signed)
Nurse contacted [employer] monitor company who indicated the old monitors which are currently being mailed to pt, is not for sensitive skin. However, he noted the new monitors, which we have on hand in office, is designed for sensitive skin. He recommended pt mail old monitor back to company and schedule an appointment to have new monitor placed.  Pt made aware and verbalized understanding. Appointment scheduled for Thursday 10/07/22

## 2022-10-07 ENCOUNTER — Ambulatory Visit: Payer: 59

## 2022-10-07 ENCOUNTER — Other Ambulatory Visit
Admission: RE | Admit: 2022-10-07 | Discharge: 2022-10-07 | Disposition: A | Discharge status: Home / Self Care | Payer: 59 | Source: Ambulatory Visit | Attending: Medical | Admitting: Medical

## 2022-10-07 DIAGNOSIS — Z79899 Other long term (current) drug therapy: Secondary | ICD-10-CM | POA: Insufficient documentation

## 2022-10-07 LAB — BASIC METABOLIC PANEL
Anion gap: 4 — ABNORMAL LOW (ref 5–15)
BUN: 13 mg/dL (ref 8–23)
CO2: 31 mmol/L (ref 22–32)
Calcium: 8.7 mg/dL — ABNORMAL LOW (ref 8.9–10.3)
Chloride: 106 mmol/L (ref 98–111)
Creatinine, Ser: 0.6 mg/dL (ref 0.44–1.00)
GFR, Estimated: >60 mL/min (ref >60)
Glucose, Bld: 108 mg/dL — ABNORMAL HIGH (ref 70–99)
Potassium: 3.2 mmol/L — ABNORMAL LOW (ref 3.5–5.1)
Sodium: 141 mmol/L (ref 135–145)

## 2022-10-13 ENCOUNTER — Other Ambulatory Visit: Payer: Self-pay | Admitting: Medical

## 2022-10-13 DIAGNOSIS — E039 Hypothyroidism, unspecified: Secondary | ICD-10-CM

## 2022-10-13 DIAGNOSIS — I10 Essential (primary) hypertension: Secondary | ICD-10-CM

## 2022-10-13 DIAGNOSIS — I483 Typical atrial flutter: Secondary | ICD-10-CM

## 2022-10-13 DIAGNOSIS — R001 Bradycardia, unspecified: Secondary | ICD-10-CM

## 2022-10-13 DIAGNOSIS — E876 Hypokalemia: Secondary | ICD-10-CM

## 2022-10-13 DIAGNOSIS — I480 Paroxysmal atrial fibrillation: Secondary | ICD-10-CM

## 2022-10-13 DIAGNOSIS — Z79899 Other long term (current) drug therapy: Secondary | ICD-10-CM

## 2022-10-20 ENCOUNTER — Ambulatory Visit: Payer: 59

## 2022-10-20 DIAGNOSIS — I483 Typical atrial flutter: Secondary | ICD-10-CM | POA: Diagnosis not present

## 2022-11-09 ENCOUNTER — Ambulatory Visit: Payer: 59 | Attending: Medical | Admitting: Medical

## 2022-11-09 ENCOUNTER — Encounter: Payer: Self-pay | Admitting: Medical

## 2022-11-09 VITALS — BP 149/84 | HR 48 | Ht 65.0 in | Wt 168.0 lb

## 2022-11-09 DIAGNOSIS — I442 Atrioventricular block, complete: Secondary | ICD-10-CM

## 2022-11-09 DIAGNOSIS — I480 Paroxysmal atrial fibrillation: Secondary | ICD-10-CM | POA: Diagnosis not present

## 2022-11-09 DIAGNOSIS — E039 Hypothyroidism, unspecified: Secondary | ICD-10-CM

## 2022-11-09 DIAGNOSIS — I5032 Chronic diastolic (congestive) heart failure: Secondary | ICD-10-CM

## 2022-11-09 DIAGNOSIS — R001 Bradycardia, unspecified: Secondary | ICD-10-CM

## 2022-11-09 DIAGNOSIS — E876 Hypokalemia: Secondary | ICD-10-CM

## 2022-11-09 DIAGNOSIS — I10 Essential (primary) hypertension: Secondary | ICD-10-CM | POA: Diagnosis not present

## 2022-11-09 DIAGNOSIS — I340 Nonrheumatic mitral (valve) insufficiency: Secondary | ICD-10-CM

## 2022-11-09 MED ORDER — HYDRALAZINE HCL 25 MG PO TABS: 25.0000 mg | ORAL_TABLET | Freq: Two times a day (BID) | ORAL | 3 refills | Status: DC
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
## 2022-11-09 NOTE — Patient Instructions (Signed)
Medication Instructions:  Your physician recommends the following medication changes.  START TAKING: Hydralazine 25 mg by mouth daily  *If you need a refill on your cardiac medications before your next appointment, please call your pharmacy*   Lab Work: Your provider would like for you to have following labs drawn today (TSH, BMP).     Testing/Procedures: No labs ordered today    Follow-Up: At [facility], you and your health needs are our priority.  As part of our continuing mission to provide you with exceptional heart care, we have created designated Provider Care Teams.  These Care Teams include your primary Cardiologist (physician) and Advanced Practice Providers (APPs -  Physician Assistants and Nurse Practitioners) who all work together to provide you with the care you need, when you need it.  We recommend signing up for the patient portal called "MyChart".  Sign up information is provided on this After Visit Summary.  MyChart is used to connect with patients for Virtual Visits (Telemedicine).  Patients are able to view lab/test results, encounter notes, upcoming appointments, etc.  Non-urgent messages can be sent to your provider as well.   To learn more about what you can do with MyChart, go to [email or web address].    Your next appointment:   4 month(s)  Provider:   You may see Lorine Bears, MD or one of the following Advanced Practice Providers on your designated Care Team:   Nicolasa Ducking, NP Eula Listen, PA-C Cadence Fransico Michael, [place] Charlsie Quest, NP    Your physician recommends that you schedule a follow-up appointment with Dr. Lalla Brothers

## 2022-11-10 LAB — BASIC METABOLIC PANEL
BUN/Creatinine Ratio: 21 (ref 12–28)
BUN: 13 mg/dL (ref 8–27)
CO2: 28 mmol/L (ref 20–29)
Calcium: 9.6 mg/dL (ref 8.7–10.3)
Chloride: 102 mmol/L (ref 96–106)
Creatinine, Ser: 0.63 mg/dL (ref 0.57–1.00)
Glucose: 97 mg/dL (ref 70–99)
Potassium: 3.7 mmol/L (ref 3.5–5.2)
Sodium: 144 mmol/L (ref 134–144)
eGFR: 95 mL/min/{1.73_m2} (ref >59)

## 2022-11-10 LAB — TSH: TSH: 0.147 u[IU]/mL — ABNORMAL LOW (ref 0.450–4.500)

## 2022-11-11 ENCOUNTER — Other Ambulatory Visit: Payer: Self-pay

## 2022-11-11 DIAGNOSIS — Z79899 Other long term (current) drug therapy: Secondary | ICD-10-CM

## 2022-12-06 ENCOUNTER — Emergency Department
Admission: EM | Admit: 2022-12-06 | Discharge: 2022-12-06 | Disposition: A | Discharge status: Home / Self Care | Payer: 59 | Attending: Emergency Medicine | Admitting: Emergency Medicine

## 2022-12-06 ENCOUNTER — Other Ambulatory Visit: Payer: Self-pay

## 2022-12-06 DIAGNOSIS — M549 Dorsalgia, unspecified: Secondary | ICD-10-CM | POA: Diagnosis present

## 2022-12-06 DIAGNOSIS — M5442 Lumbago with sciatica, left side: Secondary | ICD-10-CM | POA: Insufficient documentation

## 2022-12-06 MED ORDER — BACLOFEN 10 MG PO TABS: 10.0000 mg | ORAL_TABLET | Freq: Three times a day (TID) | ORAL | 0 refills | Status: AC

## 2022-12-06 MED ORDER — PREDNISONE 10 MG (21) PO TBPK: ORAL_TABLET | ORAL | 0 refills | Status: DC

## 2022-12-06 MED ORDER — OXYCODONE-ACETAMINOPHEN 5-325 MG PO TABS
1.0000 | ORAL_TABLET | Freq: Once | ORAL | Status: AC
  Administered 2022-12-06: 1 via ORAL
  Filled 2022-12-06: qty 1

## 2022-12-06 MED ORDER — OXYCODONE-ACETAMINOPHEN 5-325 MG PO TABS: 1.0000 | ORAL_TABLET | ORAL | 0 refills | Status: DC | PRN

## 2022-12-06 MED ORDER — CYCLOBENZAPRINE HCL 10 MG PO TABS
10.0000 mg | ORAL_TABLET | Freq: Once | ORAL | Status: AC
  Administered 2022-12-06: 10 mg via ORAL
  Filled 2022-12-06: qty 1

## 2022-12-06 MED ORDER — LIDOCAINE 5 % EX PTCH
1.0000 | MEDICATED_PATCH | Freq: Once | CUTANEOUS | Status: DC
  Administered 2022-12-06: 1 via TRANSDERMAL
  Filled 2022-12-06: qty 1

## 2022-12-06 NOTE — ED Notes (Signed)
See triage note  Presents with pain to left lower back which is radiating into left leg  States pain started couple of days ago  Denies any injury   Hx of sciatica

## 2022-12-06 NOTE — ED Provider Notes (Signed)
[facility] Provider Note    Event Date/Time   First MD Initiated Contact with Patient 12/06/22 1517     (approximate)   History   Back Pain   HPI TORINA THRUSH is a 72 y.o. female with history of sciatica and chronic back pain presents to the ER with complaints of left sided back pain with radiation to left leg.  Took tylenol, used a cream, and applied heat without any relief.  No loss of bladder or bowel control, no weakness in legs.  Symptoms started about 3 days ago and got worse overnight.      Physical Exam   Triage Vital Signs: ED Triage Vitals  Encounter Vitals Group     BP 12/06/22 1227 (!) 141/101     Systolic BP Percentile --      Diastolic BP Percentile --      Pulse Rate 12/06/22 1227 (!) 52     Resp 12/06/22 1227 18     Temp 12/06/22 1227 97.9 F (36.6 C)     Temp src --      SpO2 12/06/22 1227 100 %     Weight 12/06/22 1228 168 lb (76.2 kg)     Height 12/06/22 1228 5\' 5"  (1.651 m)     Head Circumference --      Peak Flow --      Pain Score 12/06/22 1228 10     Pain Loc --      Pain Education --      Exclude from Growth Chart --     Most recent vital signs: Vitals:   12/06/22 1227 12/06/22 1400  BP: (!) 141/101 (!) 138/98  Pulse: (!) 52 (!) 55  Resp: 18 18  Temp: 97.9 F (36.6 C)   SpO2: 100% 100%     General: Awake, no distress.   CV:  Good peripheral perfusion. regular rate and  rhythm Resp:  Normal effort.  Abd:  No distention.   Other:  Left si joint tender to palpation/ 5/5 strength lower extremities, n/v intact    ED Results / Procedures / Treatments   Labs (all labs ordered are listed, but only abnormal results are displayed) Labs Reviewed - No data to display   EKG     RADIOLOGY     PROCEDURES:   Procedures   MEDICATIONS ORDERED IN ED: Medications  lidocaine (LIDODERM) 5 % 1 patch (has no administration in time range)  oxyCODONE-acetaminophen (PERCOCET/ROXICET) 5-325 MG per tablet  1 tablet (has no administration in time range)  cyclobenzaprine (FLEXERIL) tablet 10 mg (has no administration in time range)     IMPRESSION / MDM / ASSESSMENT AND PLAN / ED COURSE  I reviewed the triage vital signs and the nursing notes.                              Differential diagnosis includes, but is not limited to, chronic back pain, sciatica, muscle spasm  Patient's presentation is most consistent with acute illness / injury with system symptoms.   Patient's exam is consistent with sciatica and exacerbation of chronic back pain  She was given a  lidoderm patch, flexeril, and percocet here in the ER.  Was given a rx for baclofen, percocet, and sterapred at discharge.  Strict instructions to return if worsening or see her regular doctor if not improving in 3 to 4 days      FINAL CLINICAL IMPRESSION(S) /  ED DIAGNOSES   Final diagnoses:  Acute left-sided back pain with sciatica     Rx / DC Orders   ED Discharge Orders          Ordered    predniSONE (STERAPRED UNI-PAK 21 TAB) 10 MG (21) TBPK tablet        12/06/22 1528    baclofen (LIORESAL) 10 MG tablet  3 times daily        12/06/22 1528    oxyCODONE-acetaminophen (PERCOCET) 5-325 MG tablet  Every 4 hours PRN        12/06/22 1528             Note:  This document was prepared using Dragon voice recognition software and may include unintentional dictation errors.    Faythe Ghee, PA-C 12/06/22 1536    Willy Eddy, MD 12/06/22 [id number]

## 2022-12-06 NOTE — Discharge Instructions (Signed)
Use ice or wet heat for back pain Take the medication as prescribed Follow up with your regular doctor if not improving in 3 to 4 days Return if worsening

## 2022-12-06 NOTE — ED Triage Notes (Signed)
Pt to ED for right sided back pain radiating down right leg. Reports hx sciatic problems.

## 2022-12-07 ENCOUNTER — Emergency Department: Payer: 59

## 2022-12-07 ENCOUNTER — Emergency Department
Admission: EM | Admit: 2022-12-07 | Discharge: 2022-12-07 | Disposition: A | Discharge status: Home / Self Care | Payer: 59 | Attending: Emergency Medicine | Admitting: Emergency Medicine

## 2022-12-07 ENCOUNTER — Other Ambulatory Visit: Payer: Self-pay

## 2022-12-07 DIAGNOSIS — M5442 Lumbago with sciatica, left side: Secondary | ICD-10-CM | POA: Diagnosis not present

## 2022-12-07 DIAGNOSIS — R112 Nausea with vomiting, unspecified: Secondary | ICD-10-CM | POA: Insufficient documentation

## 2022-12-07 LAB — CBC WITH DIFFERENTIAL/PLATELET
Abs Immature Granulocytes: 0.06 10*3/uL (ref 0.00–0.07)
Basophils Absolute: 0 10*3/uL (ref 0.0–0.1)
Basophils Relative: 0 %
Eosinophils Absolute: 0 10*3/uL (ref 0.0–0.5)
Eosinophils Relative: 0 %
HCT: 42.9 % (ref 36.0–46.0)
Hemoglobin: 13 g/dL (ref 12.0–15.0)
Immature Granulocytes: 1 %
Lymphocytes Relative: 7 %
Lymphs Abs: 0.6 10*3/uL — ABNORMAL LOW (ref 0.7–4.0)
MCH: 23 pg — ABNORMAL LOW (ref 26.0–34.0)
MCHC: 30.3 g/dL (ref 30.0–36.0)
MCV: 76.1 fL — ABNORMAL LOW (ref 80.0–100.0)
Monocytes Absolute: 0.1 10*3/uL (ref 0.1–1.0)
Monocytes Relative: 2 %
Neutro Abs: 7.8 10*3/uL — ABNORMAL HIGH (ref 1.7–7.7)
Neutrophils Relative %: 90 %
Platelets: 268 10*3/uL (ref 150–400)
RBC: 5.64 MIL/uL — ABNORMAL HIGH (ref 3.87–5.11)
RDW: 15.7 % — ABNORMAL HIGH (ref 11.5–15.5)
WBC: 8.6 10*3/uL (ref 4.0–10.5)
nRBC: 0 % (ref 0.0–0.2)

## 2022-12-07 LAB — COMPREHENSIVE METABOLIC PANEL
ALT: 13 U/L (ref 0–44)
AST: 17 U/L (ref 15–41)
Albumin: 4.2 g/dL (ref 3.5–5.0)
Alkaline Phosphatase: 68 U/L (ref 38–126)
Anion gap: 10 (ref 5–15)
BUN: 11 mg/dL (ref 8–23)
CO2: 27 mmol/L (ref 22–32)
Calcium: 9.2 mg/dL (ref 8.9–10.3)
Chloride: 102 mmol/L (ref 98–111)
Creatinine, Ser: 0.68 mg/dL (ref 0.44–1.00)
GFR, Estimated: >60 mL/min (ref >60)
Glucose, Bld: 184 mg/dL — ABNORMAL HIGH (ref 70–99)
Potassium: 4.1 mmol/L (ref 3.5–5.1)
Sodium: 139 mmol/L (ref 135–145)
Total Bilirubin: 0.6 mg/dL (ref 0.3–1.2)
Total Protein: 7.5 g/dL (ref 6.5–8.1)

## 2022-12-07 LAB — TROPONIN I (HIGH SENSITIVITY): Troponin I (High Sensitivity): <2 ng/L (ref <18)

## 2022-12-07 LAB — LIPASE, BLOOD: Lipase: 20 U/L (ref 11–51)

## 2022-12-07 MED ORDER — MORPHINE SULFATE (PF) 4 MG/ML IV SOLN
4.0000 mg | Freq: Once | INTRAVENOUS | Status: AC
  Administered 2022-12-07: 4 mg via INTRAVENOUS
  Filled 2022-12-07: qty 1

## 2022-12-07 MED ORDER — ONDANSETRON HCL 4 MG PO TABS: 4.0000 mg | ORAL_TABLET | Freq: Four times a day (QID) | ORAL | 0 refills | Status: AC | PRN

## 2022-12-07 NOTE — ED Notes (Signed)
Pt states pain on her left leg from knee down has improved but still crying in pain from hip to knee. Provided pt with ice water for PO challenge

## 2022-12-07 NOTE — ED Provider Notes (Signed)
[facility] Provider Note    Event Date/Time   First MD Initiated Contact with Patient 12/07/22 1817     (approximate)   History   Back Pain   HPI  [name] is a 72 year old female with known history of chronic back pain and psychotic presenting to the emergency department for evaluation of vomiting.  Patient was seen in our ER last night for back pain with radiation down her left leg without red flag symptoms.  She presents back today as she developed nausea and vomiting without associated abdominal pain, diarrhea, dysuria.  She was unable to keep down her medications leading her to call EMS.  She received Zofran en route with EMS and reports significant improvement in her nausea.  She continues to report low back pain.  Says this is typical for her prior episodes, has previously required an epidural injection which was effective for her.  No fevers or chills.  No chest pain or shortness of breath.  Remains without trauma, bowel or bladder incontinence or difficulty emptying bladder.     Physical Exam   Triage Vital Signs: ED Triage Vitals  Encounter Vitals Group     BP 12/07/22 1821 (!) 171/72     Systolic BP Percentile --      Diastolic BP Percentile --      Pulse Rate 12/07/22 1821 (!) 47     Resp 12/07/22 1821 20     Temp 12/07/22 1821 (!) 97.5 F (36.4 C)     Temp Source 12/07/22 1821 Oral     SpO2 12/07/22 1821 100 %     Weight 12/07/22 1822 172 lb 8 oz (78.2 kg)     Height 12/07/22 1822 5\' 5"  (1.651 m)     Head Circumference --      Peak Flow --      Pain Score 12/07/22 1822 7     Pain Loc --      Pain Education --      Exclude from Growth Chart --     Most recent vital signs: Vitals:   12/07/22 1915 12/07/22 2044  BP: (!) 112/95   Pulse: (!) 53 (!) 59  Resp: 16 20  Temp:    SpO2: 95% 99%     General: Awake, interactive  CV:  Bradycardic with regular rhythm, normal peripheral perfusion Resp:   Unlabored  respirations Abd:  Soft, nondistended, nontender to palpation diffusely, no rebound or guarding Neuro:  Symmetric facial movement, fluid speech   ED Results / Procedures / Treatments   Labs (all labs ordered are listed, but only abnormal results are displayed) Labs Reviewed  CBC WITH DIFFERENTIAL/PLATELET - Abnormal; Notable for the following components:      Result Value   RBC 5.64 (*)    MCV 76.1 (*)    MCH 23.0 (*)    RDW 15.7 (*)    Neutro Abs 7.8 (*)    Lymphs Abs 0.6 (*)    All other components within normal limits  COMPREHENSIVE METABOLIC PANEL - Abnormal; Notable for the following components:   Glucose, Bld 184 (*)    All other components within normal limits  LIPASE, BLOOD  TROPONIN I (HIGH SENSITIVITY)     EKG EKG independently reviewed interpreted by myself (ER attending) demonstrates:  EKG demonstrate sinus bradycardia at a rate of 45, PR 164, QRS 102, QTc 435, no acute ST changes  RADIOLOGY Imaging independently reviewed and interpreted by myself demonstrates:  CXR with  stable cardiomegaly, no focal consolidation  PROCEDURES:  Critical Care performed: No  Procedures   MEDICATIONS ORDERED IN ED: Medications  morphine (PF) 4 MG/ML injection 4 mg (4 mg Intravenous Given 12/07/22 2045)     IMPRESSION / MDM / ASSESSMENT AND PLAN / ED COURSE  I reviewed the triage vital signs and the nursing notes.  Differential diagnosis includes, but is not limited to, sciatica, electrolyte abnormality, biliary pathology, pancreatitis  Patient's presentation is most consistent with acute presentation with potential threat to life or bodily function.  Back pain remains consistent is consistent with sciatica without historical features suggestive of acute spinal cord pathology.  Regarding her nausea, unclear precipitant.  Labs were obtained here without significant derangement.  She has no abdominal pain on exam here.  I did discuss the possibility of CT imaging, but  patient says her nausea is completely resolved currently.  She has not been able to tolerate her home pain medicine, so she was given a dose of IV pain medicine here.  Had some improvement, but some ongoing pain on reevaluation.  She does report she has a follow-up appointment tomorrow.  She did tolerate a p.o. trial.  She is comfortable with discharge home with continued use of her home medications.  Will also discharge with a prescription for Zofran.  Strict return precautions provided.  Patient discharged stable condition.      FINAL CLINICAL IMPRESSION(S) / ED DIAGNOSES   Final diagnoses:  Nausea and vomiting, unspecified vomiting type  Acute left-sided low back pain with left-sided sciatica     Rx / DC Orders   ED Discharge Orders          Ordered    ondansetron (ZOFRAN) 4 MG tablet  Every 6 hours PRN        12/07/22 2210             Note:  This document was prepared using Dragon voice recognition software and may include unintentional dictation errors.   Trinna Post, MD 12/07/22 2211

## 2022-12-07 NOTE — ED Triage Notes (Signed)
Pt arrives [facility] for low back pain and sciatica. Pt seen here for same and returned today for N/V and unable to keep medications down. Pt given 4 mg zofran IV en route. Pt reports improvement in symptoms.

## 2022-12-07 NOTE — ED Notes (Signed)
Assumed care of pt at this time. Pt is AAXO4, on CCM, VS stable and WNL. Pt c/o sciatic related left leg pain and "feeling sick". Pt states she was placed on baclofen, percocet, and a steroid, pt believes the medication are making her feel sick. Pt accompanied by family member

## 2022-12-07 NOTE — Discharge Instructions (Signed)
You were seen in the ER today for evaluation of your nausea and vomiting as well as your ongoing back pain.  Please keep your scheduled follow-up regarding your back pain.  I have sent a prescription for nausea medicine to your pharmacy that you can take as needed.  If you are unable to tolerate food or liquids despite your nausea medicine or if you develop abdominal pain, or if any other new or concerning symptoms, please return to the ER immediately.  Otherwise, follow-up with your primary care doctor and back team for further evaluation.

## 2022-12-07 NOTE — ED Notes (Signed)
Provided pt with discharge instructions and education. All of pt questions answered. Pt in possession of all belongings. Pt AAOX4 and stable at time of discharge. Pt able to stand up and ambulate. Pt wheeled to husband vehicle safely.

## 2023-01-06 ENCOUNTER — Ambulatory Visit
Admission: RE | Admit: 2023-01-06 | Discharge: 2023-01-06 | Disposition: A | Discharge status: Home / Self Care | Payer: 59 | Source: Ambulatory Visit | Attending: Nurse Practitioner | Admitting: Nurse Practitioner

## 2023-01-06 DIAGNOSIS — Z780 Asymptomatic menopausal state: Secondary | ICD-10-CM | POA: Insufficient documentation

## 2023-03-11 ENCOUNTER — Ambulatory Visit: Payer: 59 | Admitting: Cardiovascular Disease

## 2023-03-11 NOTE — Progress Notes (Deleted)
Cardiology Office Note   Date:  03/11/2023   ID:  [name], [name] 04-Dec-1950, MRN [id number]  PCP:  [facility]  Cardiologist:   Lorine Bears, MD   No chief complaint on file.     History of Present Illness: [name] is a 72 y.o. female who presents for a follow-up visit. She has known history of typical atrial flutter status post ablation done by Dr. Graciela Husbands in 2017. She also has essential hypertension mild to moderate mitral regurgitation and previous tobacco use. She had angioedema on lisinopril.   She has been doing reasonably well from a cardiac standpoint with no chest pain, shortness of breath or palpitations.  She is mostly bothered by severe left leg pain which has been going on for about 1 month.  It was thought to be musculoskeletal but did not improve with the muscle relaxant.  She went to the emergency room recently and had a venous Doppler done which was negative for DVT.  She was prescribed Lyrica but has not started the medication yet.  The pain is constant at rest and gets worse with certain positions.  No back pain.   Past Medical History:  Diagnosis Date   Anemia    Arthritis    Atrial flutter (HCC) 09/2015   a.) CHADS2VASc 2 (sex, HTN); b.) s/p DCCV 11/11/2015 (150 J); c.) s/p RFCA 01/17/2016; d.) rate/rhythm maintained without pharmacological intervention; no chronic anticoagulation.   Coronary artery calcification seen on CT scan    Diastolic dysfunction 08/23/2019   a.)  TTE 08/23/2019: EF 55-60%, severe LAE, mild-mod MR, PASP 46.2, G2DD   Dyspnea    Essential hypertension    GERD (gastroesophageal reflux disease)    Hepatic steatosis    Incomplete left bundle branch block (LBBB)    Pneumonia    as a child   Subclinical hypothyroidism    Tobacco abuse     Past Surgical History:  Procedure Laterality Date   ABDOMINAL HYSTERECTOMY     ABLATION OF DYSRHYTHMIC FOCUS  01/16/2016   ELECTROPHYSIOLOGIC STUDY N/A 11/11/2015    Procedure: CARDIOVERSION;  Surgeon: Antonieta Iba, MD;  Location: [facility] ORS;  Service: Cardiovascular;  Laterality: N/A;   ELECTROPHYSIOLOGIC STUDY N/A 01/16/2016   Procedure: A-Flutter Ablation;  Surgeon: Duke Salvia, MD;  Location: [facility] INVASIVE CV LAB;  Service: Cardiovascular;  Laterality: N/A;   TOTAL HIP ARTHROPLASTY Right 12/10/2021   Procedure: TOTAL HIP ARTHROPLASTY ANTERIOR APPROACH;  Surgeon: Reinaldo Berber, MD;  Location: [facility] ORS;  Service: Orthopedics;  Laterality: Right;     Current Outpatient Medications  Medication Sig Dispense Refill   acetaminophen (TYLENOL) 500 MG tablet Take 500 mg by mouth every 6 (six) hours as needed for moderate pain or headache.     albuterol (VENTOLIN HFA) 108 (90 Base) MCG/ACT inhaler Inhale 2-4 puffs by mouth every 4 hours as needed for wheezing, cough, and/or shortness of breath 8 g 1   amLODipine (NORVASC) 10 MG tablet Take 1 tablet (10 mg total) by mouth daily. 30 tablet 11   ascorbic acid (VITAMIN C) 500 MG tablet Take 500 mg by mouth daily.     benzonatate (TESSALON) 100 MG capsule Take 100 mg by mouth 3 (three) times daily as needed.     cholecalciferol (VITAMIN D) 1000 units tablet Take 1,000 Units by mouth daily.     diclofenac Sodium (VOLTAREN) 1 % GEL Apply 2 g topically as needed.     hydrALAZINE (APRESOLINE) 25  MG tablet Take 1 tablet (25 mg total) by mouth 2 (two) times daily. 180 tablet 3   hydrOXYzine (ATARAX) 10 MG tablet Take 1 tablet (10 mg total) by mouth 3 (three) times daily as needed. 30 tablet 0   levothyroxine (SYNTHROID, LEVOTHROID) 88 MCG tablet Take 125 mcg by mouth daily before breakfast.     losartan (COZAAR) 100 MG tablet Take 1 tablet (100 mg total) by mouth daily. 90 tablet 3   meloxicam (MOBIC) 15 MG tablet 15 mg daily.     oxyCODONE-acetaminophen (PERCOCET) 5-325 MG tablet Take 1 tablet by mouth every 4 (four) hours as needed for severe pain. 10 tablet 0   predniSONE (STERAPRED UNI-PAK 21 TAB) 10 MG (21) TBPK  tablet Take 6 pills on day one then decrease by 1 pill each day 21 tablet 0   No current facility-administered medications for this visit.    Allergies:   Lisinopril, Tizanidine, and Amoxicillin    Social History:  The patient  reports that she quit smoking about 7 years ago. Her smoking use included cigarettes. She has never used smokeless tobacco. She reports current alcohol use. She reports that she does not use drugs.   Family History:  The patient's family history includes Diabetes in her brother, brother, father, and mother; Heart attack in her brother; Hypertension in her brother, brother, and mother.    ROS:  Please see the history of present illness.   Otherwise, review of systems are positive for none.   All other systems are reviewed and negative.    PHYSICAL EXAM: VS:  There were no vitals taken for this visit. , BMI There is no height or weight on file to calculate BMI. GEN: Well nourished, well developed, in no acute distress  HEENT: normal  Neck: no JVD, carotid bruits, or masses Cardiac: RRR; no murmurs, rubs, or gallops,no edema  Respiratory:  clear to auscultation bilaterally, normal work of breathing GI: soft, nontender, nondistended, + BS MS: no deformity or atrophy  Skin: warm and dry, no rash Neuro:  Strength and sensation are intact Psych: euthymic mood, full affect Vascular: Distal pulses are palpable bilaterally.   EKG:  EKG is ordered today. The ekg ordered today demonstrates sinus rhythm with short PR.  Lateral T wave changes suggestive of ischemia.    Recent Labs: 09/20/2022: Magnesium 2.1 11/09/2022: TSH 0.147 12/07/2022: ALT 13; BUN 11; Creatinine, Ser 0.68; Hemoglobin 13.0; Platelets 268; Potassium 4.1; Sodium 139    Lipid Panel No results found for: "CHOL", "TRIG", "HDL", "CHOLHDL", "VLDL", "LDLCALC", "LDLDIRECT"    Wt Readings from Last 3 Encounters:  12/07/22 172 lb 8 oz (78.2 kg)  12/06/22 168 lb (76.2 kg)  11/09/22 168 lb (76.2 kg)          11/06/2015   10:50 AM 10/09/2015    3:11 PM  PAD Screen  Previous PAD dx? No No  Previous surgical procedure? No No  Pain with walking? Yes No  Subsides with rest? Yes   Feet/toe relief with dangling? No No  Painful, non-healing ulcers? No No  Extremities discolored? No No      ASSESSMENT AND PLAN:  1.  History of typical atrial flutter status post successful ablation: No evidence of recurrent arrhythmia. Continue observation.   2. Essential hypertension: Blood pressure is elevated today but likely due to pain in her left leg.  I made no changes in medications.  3.  Mitral regurgitation: Echocardiogram in June 2021 showed normal LV systolic function with mild  to moderate mitral regurgitation.  No murmurs by exam today.  Continue to monitor clinically and consider repeat echocardiogram in few years.  4.  Left leg pain: Likely neuropathic or musculoskeletal.  Negative DVT study.  Distal pulses are palpable and thus does not seem to be vascular in etiology.    Disposition:   FU with me in 12 months  Signed,  Lorine Bears, MD  03/11/2023 7:33 AM    [facility]

## 2023-03-22 NOTE — Progress Notes (Signed)
 Electrophysiology Office Note:    Date:  03/23/2023   ID:  [name], DOB 06/29/1950, MRN [id number]  [facility] Cardiologist:  Deatrice Cage, MD  [facility] Electrophysiologist:  OLE ONEIDA HOLTS, MD   Referring MD: Franchester Mikey DEL, PA-C   Chief Complaint: Atrial fibrillation and bradycardia  History of Present Illness:    [name] is a 73 year old woman who I am seeing today for an evaluation of atrial fibrillation and bradycardia.  She has a history of atrial flutter post ablation by Dr. Fernande on January 17, 2016.  In July she was seen in the emergency department for palpitations.  EMS found her with atrial fibrillation and ventricular rates in the 150s and 160s.  By the time she arrived in the emergency department she was back in sinus rhythm.  She was seen in the emergency department again on July 8 with bradycardia and rates in the 40s.  Echo has previously demonstrated a severely dilated left atrium.  Hypokalemia has previously been a trigger for her atrial arrhythmias.  She is referred to discuss treatment options for her arrhythmias.  Today she tells me that in July she ate something at her family's house that did not settle right.  She then developed an irregular heartbeat and called 911.  When EMS first arrived she was still in atrial fibrillation with ventricular rates in the 120s.  By the time she got to the ambulance the episode of atrial fibrillation had stopped and she was back in normal rhythm.  She is very clear that she has not had another episode since the episode in July.     Their past medical, social and family history was reviewed.   ROS:   Please see the history of present illness.    All other systems reviewed and are negative.  EKGs/Labs/Other Studies Reviewed:    The following studies were reviewed today:  October 28, 2022 ZIO monitor No atrial fibrillation, longest salvo of atrial tachycardia was 15 beats, average heart rate 57  October 21, 2022 echo EF 55 RV function normal Severely dilated left atrium Moderately dilated right atrium Small pericardial effusion Mild to moderate MR  EMS records from July 2024 Atrial fibrillation documented on twelve-lead and rhythm strip       EKG Interpretation Date/Time:  Wednesday March 23 2023 10:51:43 EST Ventricular Rate:  56 PR Interval:  88 QRS Duration:  92 QT Interval:  438 QTC Calculation: 422 R Axis:   53  Text Interpretation: Sinus bradycardia with short PR Confirmed by Holts Ole [phone]) on 03/23/2023 10:57:06 AM    Physical Exam:    VS:  BP 138/70 (BP Location: Left Arm, Patient Position: Sitting, Cuff Size: Normal)   Pulse (!) 56   Ht 5' 5.5 (1.664 m)   Wt 175 lb 3.2 oz (79.5 kg)   SpO2 99%   BMI 28.71 kg/m     Wt Readings from Last 3 Encounters:  03/23/23 175 lb 3.2 oz (79.5 kg)  12/07/22 172 lb 8 oz (78.2 kg)  12/06/22 168 lb (76.2 kg)     GEN: no distress CARD: RRR, No MRG RESP: No IWOB. CTAB.        ASSESSMENT AND PLAN:    1. Typical atrial flutter (HCC)   2. Paroxysmal A-fib (HCC)     #Paroxysmal atrial fibrillation and flutter Successful prior flutter ablation in 2017 with Dr. Fernande She has a single brief documented atrial fibrillation on the EMS records from July 2024.  She is very clear that she has not had another episode since then.  This episode may have been triggered by GI upset.  Her CHA2DS2-VASc is at least 4 for age, gender, diastolic heart failure and hypertension.  We discussed the pathophysiology of atrial fibrillation.  We discussed her risk of stroke given her CHA2DS2-VASc.  We discussed options including starting Eliquis  now versus continuing with a more conservative/watchful waiting approach.  We discussed atrial fibrillation surveillance strategies including wearable monitors, patch monitors and implantable loop recorder's.  After our discussion, the patient elected to proceed with loop recorder monitoring of  her heart rhythm.  She will start aspirin  81 mg by mouth once daily.  If she were to develop atrial fibrillation on loop recorder monitoring, she will stop the aspirin  and start Eliquis .  I discussed the loop recorder implant procedure in detail the patient including the risks and monthly monitoring cost and she wishes to proceed.  We will get this scheduled for her in the next few weeks.  Plan for [employer] device.   #Hypertension At goal today.  Recommend checking blood pressures 1-2 times per week at home and recording the values.  Recommend bringing these recordings to the primary care physician. Continue losartan , hydralazine , amlodipine     Signed, Ole T. Cindie, MD, [facility], [facility] 03/23/2023 11:12 AM    Electrophysiology [facility]

## 2023-03-23 ENCOUNTER — Ambulatory Visit: Payer: 59 | Attending: Cardiology | Admitting: Cardiology

## 2023-03-23 ENCOUNTER — Encounter: Payer: Self-pay | Admitting: Cardiology

## 2023-03-23 VITALS — BP 138/70 | HR 56 | Ht 65.5 in | Wt 175.2 lb

## 2023-03-23 DIAGNOSIS — I480 Paroxysmal atrial fibrillation: Secondary | ICD-10-CM | POA: Diagnosis not present

## 2023-03-23 DIAGNOSIS — I483 Typical atrial flutter: Secondary | ICD-10-CM

## 2023-03-23 MED ORDER — ASPIRIN 81 MG PO TBEC: 81.0000 mg | DELAYED_RELEASE_TABLET | Freq: Every day | ORAL | Status: DC

## 2023-03-23 NOTE — Patient Instructions (Addendum)
 Medication Instructions:  Your physician has recommended you make the following change in your medication:  1) START taking Aspirin  81 mg daily  *If you need a refill on your cardiac medications before your next appointment, please call your pharmacy*  Testing/Procedures: You will have a loop recorder implanted at your next appointment. There are no restrictions or special instructions prior to this appointment. You will not be able to take a shower for 72 hours after the loop recorder is implanted.  Follow-Up: At [facility], you and your health needs are our priority.  As part of our continuing mission to provide you with exceptional heart care, we have created designated Provider Care Teams.  These Care Teams include your primary Cardiologist (physician) and Advanced Practice Providers (APPs -  Physician Assistants and Nurse Practitioners) who all work together to provide you with the care you need, when you need it.  Your next appointment:     Provider:   Ole Holts, MD

## 2023-04-01 ENCOUNTER — Emergency Department: Payer: 59

## 2023-04-01 ENCOUNTER — Other Ambulatory Visit: Payer: Self-pay

## 2023-04-01 ENCOUNTER — Emergency Department
Admission: EM | Admit: 2023-04-01 | Discharge: 2023-04-01 | Disposition: A | Discharge status: Home / Self Care | Payer: 59 | Attending: Emergency Medicine | Admitting: Emergency Medicine

## 2023-04-01 ENCOUNTER — Ambulatory Visit: Payer: 59 | Admitting: Medical

## 2023-04-01 DIAGNOSIS — I4891 Unspecified atrial fibrillation: Secondary | ICD-10-CM | POA: Diagnosis not present

## 2023-04-01 DIAGNOSIS — R002 Palpitations: Secondary | ICD-10-CM | POA: Diagnosis present

## 2023-04-01 LAB — BASIC METABOLIC PANEL
Anion gap: 11 (ref 5–15)
BUN: 20 mg/dL (ref 8–23)
CO2: 26 mmol/L (ref 22–32)
Calcium: 9.1 mg/dL (ref 8.9–10.3)
Chloride: 106 mmol/L (ref 98–111)
Creatinine, Ser: 0.82 mg/dL (ref 0.44–1.00)
GFR, Estimated: >60 mL/min (ref >60)
Glucose, Bld: 117 mg/dL — ABNORMAL HIGH (ref 70–99)
Potassium: 3.4 mmol/L — ABNORMAL LOW (ref 3.5–5.1)
Sodium: 143 mmol/L (ref 135–145)

## 2023-04-01 LAB — CBC
HCT: 44.9 % (ref 36.0–46.0)
Hemoglobin: 13.5 g/dL (ref 12.0–15.0)
MCH: 23.6 pg — ABNORMAL LOW (ref 26.0–34.0)
MCHC: 30.1 g/dL (ref 30.0–36.0)
MCV: 78.5 fL — ABNORMAL LOW (ref 80.0–100.0)
Platelets: 230 10*3/uL (ref 150–400)
RBC: 5.72 MIL/uL — ABNORMAL HIGH (ref 3.87–5.11)
RDW: 16 % — ABNORMAL HIGH (ref 11.5–15.5)
WBC: 7.7 10*3/uL (ref 4.0–10.5)
nRBC: 0 % (ref 0.0–0.2)

## 2023-04-01 LAB — TROPONIN I (HIGH SENSITIVITY)
Troponin I (High Sensitivity): 7 ng/L (ref <18)
Troponin I (High Sensitivity): 10 ng/L

## 2023-04-01 MED ORDER — APIXABAN 5 MG PO TABS
5.0000 mg | ORAL_TABLET | Freq: Once | ORAL | Status: AC
  Administered 2023-04-01: 5 mg via ORAL
  Filled 2023-04-01: qty 1

## 2023-04-01 MED ORDER — APIXABAN 5 MG PO TABS: 5.0000 mg | ORAL_TABLET | Freq: Two times a day (BID) | ORAL | 1 refills | Status: DC

## 2023-04-01 NOTE — ED Provider Notes (Signed)
[facility] Provider Note    Event Date/Time   First MD Initiated Contact with Patient 04/01/23 1906     (approximate)   History   Atrial Fibrillation   HPI  [name] is a 73 y.o. female who presents to the emergency department today because of concerns for palpitations.  Patient states that she has a history of atrial flutter.  Last had an episode over the summer.  She was at a store today when she started feeling like her heart was racing.  She denies any pain.  At the time my exam she is feeling better.  She says that she saw her a little over a week ago and they discussed putting a loop recorder in however that has not yet happened.   Physical Exam   Triage Vital Signs: ED Triage Vitals  Encounter Vitals Group     BP 04/01/23 1736 130/83     Systolic BP Percentile --      Diastolic BP Percentile --      Pulse Rate 04/01/23 1736 (!) 135     Resp 04/01/23 1736 16     Temp 04/01/23 1736 98.2 F (36.8 C)     Temp Source 04/01/23 1736 Oral     SpO2 04/01/23 1736 99 %     Weight --      Height --      Head Circumference --      Peak Flow --      Pain Score 04/01/23 1737 0     Pain Loc --      Pain Education --      Exclude from Growth Chart --     Most recent vital signs: Vitals:   04/01/23 1736 04/01/23 1902  BP: 130/83 132/67  Pulse: (!) 135   Resp: 16 (!) 22  Temp: 98.2 F (36.8 C)   SpO2: 99%    General: Awake, alert, oriented. CV:  Good peripheral perfusion. Regular rate and rhythm. Resp:  Normal effort. Lungs clear. Abd:  No distention.   ED Results / Procedures / Treatments   Labs (all labs ordered are listed, but only abnormal results are displayed) Labs Reviewed  BASIC METABOLIC PANEL - Abnormal; Notable for the following components:      Result Value   Potassium 3.4 (*)    Glucose, Bld 117 (*)    All other components within normal limits  CBC - Abnormal; Notable for the following components:   RBC 5.72 (*)     MCV 78.5 (*)    MCH 23.6 (*)    RDW 16.0 (*)    All other components within normal limits  TROPONIN I (HIGH SENSITIVITY)  TROPONIN I (HIGH SENSITIVITY)     EKG  I, Phineas Semen, attending physician, personally viewed and interpreted this EKG  EKG Time: 1753 Rate: 79 Rhythm: sinus rhythm with PAC Axis: normal Intervals: qtc 438 QRS: narrow, q waves v1 ST changes: no st elevation Impression: abnormal ekg    RADIOLOGY I independently interpreted and visualized the CXR. My interpretation: No pneumonia Radiology interpretation:  IMPRESSION:  Cardiomegaly, chronic changes.    No active disease.      PROCEDURES:  Critical Care performed: No  MEDICATIONS ORDERED IN ED: Medications - No data to display   IMPRESSION / MDM / ASSESSMENT AND PLAN / ED COURSE  I reviewed the triage vital signs and the nursing notes.  Differential diagnosis includes, but is not limited to, arrhythmia, electrolyte abnormality, anemia, infection  Patient's presentation is most consistent with acute presentation with potential threat to life or bodily function.   The patient is on the cardiac monitor to evaluate for evidence of arrhythmia and/or significant heart rate changes.  Patient presented to the emergency department today because of concerns for palpitations.  Patient has history atrial fibrillation.  By the time my exam patient was back in a normal sinus rhythm.  She stated she felt better.  I did have a discussion with the patient about anticoagulation.  At this time she would be interested in Eliquis.  She is currently just taking aspirin.  Will give first dose here in the emergency department and discharge with prescription for Eliquis.  Encouraged patient to follow-up closely with cardiologist.      FINAL CLINICAL IMPRESSION(S) / ED DIAGNOSES   Final diagnoses:  Atrial fibrillation, unspecified type [facility])     Note:  This document was  prepared using Dragon voice recognition software and may include unintentional dictation errors.    Phineas Semen, MD 04/01/23 2030

## 2023-04-01 NOTE — Discharge Instructions (Addendum)
Please stop taking your aspirin. Please seek medical attention for any high fevers, chest pain, shortness of breath, change in behavior, persistent vomiting, bloody stool or any other new or concerning symptoms.

## 2023-04-01 NOTE — ED Triage Notes (Signed)
Pt to ed from store via [facility] for heart issues. Pt feels weird sensation in her chest but denies CP. Usded to take eliquis but stopped taking it. Pt has HX of afib and has had 2 ablations. EMS vitals 143BGL 123/81 18LAC 100NS 5G dilt - which decreased but once worn off increased again

## 2023-04-04 ENCOUNTER — Telehealth: Payer: Self-pay | Admitting: Cardiology

## 2023-04-04 NOTE — Telephone Encounter (Signed)
Pt wants to talk to nurse about recent ER Visit because of her heart rate

## 2023-04-05 NOTE — Telephone Encounter (Signed)
Spoke with the patient who states that she felt like she was in atrial flutter/fib over the weekend and called EMS. She was in sinus rhythm by the time she arrived at the ED. Due to her hx of a fib they recommended that she start back on Eliquis. She is currently scheduled to have a loop recorder placed by Dr. Lalla Brothers on 2/26 to monitor for AFIB.

## 2023-04-15 ENCOUNTER — Other Ambulatory Visit: Payer: Self-pay

## 2023-04-15 ENCOUNTER — Emergency Department
Admission: EM | Admit: 2023-04-15 | Discharge: 2023-04-15 | Disposition: A | Discharge status: Home / Self Care | Payer: 59 | Attending: Emergency Medicine | Admitting: Emergency Medicine

## 2023-04-15 ENCOUNTER — Emergency Department: Payer: 59

## 2023-04-15 DIAGNOSIS — R0789 Other chest pain: Secondary | ICD-10-CM | POA: Diagnosis not present

## 2023-04-15 DIAGNOSIS — R001 Bradycardia, unspecified: Secondary | ICD-10-CM | POA: Insufficient documentation

## 2023-04-15 DIAGNOSIS — R002 Palpitations: Secondary | ICD-10-CM | POA: Insufficient documentation

## 2023-04-15 DIAGNOSIS — Z7901 Long term (current) use of anticoagulants: Secondary | ICD-10-CM | POA: Insufficient documentation

## 2023-04-15 LAB — BASIC METABOLIC PANEL
Anion gap: 10 (ref 5–15)
BUN: 16 mg/dL (ref 8–23)
CO2: 29 mmol/L (ref 22–32)
Calcium: 9.3 mg/dL (ref 8.9–10.3)
Chloride: 103 mmol/L (ref 98–111)
Creatinine, Ser: 0.84 mg/dL (ref 0.44–1.00)
GFR, Estimated: >60 mL/min (ref >60)
Glucose, Bld: 114 mg/dL — ABNORMAL HIGH (ref 70–99)
Potassium: 3.9 mmol/L (ref 3.5–5.1)
Sodium: 142 mmol/L (ref 135–145)

## 2023-04-15 LAB — CBC
HCT: 44.2 % (ref 36.0–46.0)
Hemoglobin: 13.6 g/dL (ref 12.0–15.0)
MCH: 23.4 pg — ABNORMAL LOW (ref 26.0–34.0)
MCHC: 30.8 g/dL (ref 30.0–36.0)
MCV: 76.1 fL — ABNORMAL LOW (ref 80.0–100.0)
Platelets: 263 10*3/uL (ref 150–400)
RBC: 5.81 MIL/uL — ABNORMAL HIGH (ref 3.87–5.11)
RDW: 15.9 % — ABNORMAL HIGH (ref 11.5–15.5)
WBC: 7.2 10*3/uL (ref 4.0–10.5)
nRBC: 0 % (ref 0.0–0.2)

## 2023-04-15 LAB — TROPONIN I (HIGH SENSITIVITY)
Troponin I (High Sensitivity): 7 ng/L (ref <18)
Troponin I (High Sensitivity): 6 ng/L (ref <18)

## 2023-04-15 NOTE — Discharge Instructions (Addendum)
You were seen in the emergency department for heart palpitations.  Your lab work was normal and you had 2 heart enzymes that were normal.  Do not believe you are having a heart attack today.  Your electrolytes were normal.  You had a chest x-ray done that did not show any signs of pneumonia.  Stay hydrated and drink plenty of fluids.  Call your cardiologist to schedule close follow-up appointment and discuss your visit to the emergency department.  If you have any worsening symptoms or symptoms of dizziness, vomiting, chest pain or feeling like you are going to pass out please return to the emergency department for reevaluation.  Thank you for choosing [place] for your health care, it was my pleasure to care for you today!  Corena Herter, MD

## 2023-04-15 NOTE — ED Provider Notes (Signed)
[facility] Provider Note    Event Date/Time   First MD Initiated Contact with Patient 04/15/23 2035     (approximate)   History   Palpitations   HPI  [name] is a 73 y.o. female past medical history significant for paroxysmal atrial fibrillation with prior ablation on Eliquis, who presents to the emergency department with heart palpitations.  States that she has been feeling like she was having heart palpitations and some mild chest discomfort today.  Felt like it might be her atrial fibrillation and that she has had issues within the past.  Denies any significant nausea or vomiting.  No abdominal pain.  No dysuria, urinary urgency or frequency.  Denies any headache or change in vision.     Physical Exam   Triage Vital Signs: ED Triage Vitals  Encounter Vitals Group     BP 04/15/23 1252 (!) 147/67     Systolic BP Percentile --      Diastolic BP Percentile --      Pulse Rate 04/15/23 1252 (!) 50     Resp 04/15/23 1252 18     Temp 04/15/23 1252 98.1 F (36.7 C)     Temp Source 04/15/23 1252 Oral     SpO2 04/15/23 1252 100 %     Weight 04/15/23 1255 170 lb (77.1 kg)     Height 04/15/23 1255 5\' 5"  (1.651 m)     Head Circumference --      Peak Flow --      Pain Score 04/15/23 1255 0     Pain Loc --      Pain Education --      Exclude from Growth Chart --     Most recent vital signs: Vitals:   04/15/23 2000 04/15/23 2151  BP: (!) 139/90   Pulse: (!) 54   Resp: 18   Temp:  98.1 F (36.7 C)  SpO2: 100%     Physical Exam Constitutional:      Appearance: She is well-developed.  HENT:     Head: Atraumatic.     Mouth/Throat:     Mouth: Mucous membranes are moist.  Eyes:     Extraocular Movements: Extraocular movements intact.     Conjunctiva/sclera: Conjunctivae normal.     Pupils: Pupils are equal, round, and reactive to light.  Cardiovascular:     Rate and Rhythm: Regular rhythm. Bradycardia present.  Pulmonary:     Effort:  No respiratory distress.  Abdominal:     General: There is no distension.     Tenderness: There is no abdominal tenderness.  Musculoskeletal:        General: Normal range of motion.     Cervical back: Normal range of motion.     Right lower leg: No edema.     Left lower leg: No edema.  Skin:    General: Skin is warm.     Capillary Refill: Capillary refill takes less than 2 seconds.  Neurological:     Mental Status: She is alert. Mental status is at baseline.  Psychiatric:        Mood and Affect: Mood normal.     IMPRESSION / MDM / ASSESSMENT AND PLAN / ED COURSE  I reviewed the triage vital signs and the nursing notes.  Differential diagnosis including ACS, gastritis/PUD, electrolyte abnormality, viral illness including COVID/influenza.   EKG  I, Corena Herter, the attending physician, personally viewed and interpreted this ECG.  Sinus bradycardia with a heart  rate of 59.  Normal intervals.  No chamber enlargement.  Mild ST depression isolated to lead III.  No significant change when compared to prior EKG when she has been bradycardic in the past.   multiple EKGs in the past that shows signs of atrial fibrillation with a rapid rate.  Sinus bradycardia while on cardiac telemetry.  RADIOLOGY I independently reviewed imaging, my interpretation of imaging: Chest x-ray with no signs of pneumonia.  Cardiomegaly.  LABS (all labs ordered are listed, but only abnormal results are displayed) Labs interpreted as -    Labs Reviewed  BASIC METABOLIC PANEL - Abnormal; Notable for the following components:      Result Value   Glucose, Bld 114 (*)    All other components within normal limits  CBC - Abnormal; Notable for the following components:   RBC 5.81 (*)    MCV 76.1 (*)    MCH 23.4 (*)    RDW 15.9 (*)    All other components within normal limits  TROPONIN I (HIGH SENSITIVITY)  TROPONIN I (HIGH SENSITIVITY)     MDM    No leukocytosis or electrolyte abnormality.  Serial  troponins are negative.  Chest pain-free.  Clinical picture is not consistent with ACS, troponins are negative.  Low suspicion for pulmonary embolism.  No findings of pneumonia.  Discussed oral hydration and close follow-up with her cardiologist.  Given return precautions for any ongoing or worsening symptoms.   PROCEDURES:  Critical Care performed: No  Procedures  Patient's presentation is most consistent with acute presentation with potential threat to life or bodily function.   MEDICATIONS ORDERED IN ED: Medications - No data to display  FINAL CLINICAL IMPRESSION(S) / ED DIAGNOSES   Final diagnoses:  Palpitations     Rx / DC Orders   ED Discharge Orders     None        Note:  This document was prepared using Dragon voice recognition software and may include unintentional dictation errors.   Corena Herter, MD 04/15/23 [phone]

## 2023-04-15 NOTE — ED Triage Notes (Signed)
 Refer to previous note.

## 2023-04-15 NOTE — ED Notes (Signed)
Pt arrived via EMS from home. Per EMS pt sts that she is feeling palpitations and than gets dizzy. Pt has had ablations previously. Per EMS, pt was in afib on the monitor.

## 2023-04-19 ENCOUNTER — Telehealth: Payer: Self-pay | Admitting: Cardiology

## 2023-04-19 NOTE — Telephone Encounter (Signed)
 Called patient, she states they started her back on Eliquis  5 mg twice daily when she went to the ED on 01/17- since starting it she has flutters in the chest, and feels lightheaded. She states she decided on Saturday that she did not want to take the Eliquis  anymore because of the way it makes her feel. She states she wants to go back to taking Aspirin  81 mg, and she has not had these symptoms. She states she made her PCP aware of this yesterday as well. Patient states she only wants to take the Aspirin  81 until she sees Dr.Lambert on 02/26. I advised I would send a message back, patient is adamant not to continue to take Eliquis .

## 2023-04-19 NOTE — Telephone Encounter (Signed)
 Pt c/o medication issue:  1. Name of Medication: Eliquis   2. How are you currently taking this medication (dosage and times per day)?   3. Are you having a reaction (difficulty breathing--STAT)?   4. What is your medication issue? Patient said when she went to the ER on 04-01-23, they started her on Eliquis . Patient wants to know is she can back taking 81 mg of Aspirin . She says she feels better when she take the Aspirin . Patient says when she takes Eliquis , she has  little flutters.,

## 2023-04-20 ENCOUNTER — Other Ambulatory Visit: Payer: Self-pay | Admitting: Nurse Practitioner

## 2023-04-20 DIAGNOSIS — Z1231 Encounter for screening mammogram for malignant neoplasm of breast: Secondary | ICD-10-CM

## 2023-05-03 ENCOUNTER — Ambulatory Visit (INDEPENDENT_AMBULATORY_CARE_PROVIDER_SITE_OTHER): Payer: 59 | Admitting: Podiatry

## 2023-05-03 DIAGNOSIS — L600 Ingrowing nail: Secondary | ICD-10-CM

## 2023-05-03 DIAGNOSIS — I999 Unspecified disorder of circulatory system: Secondary | ICD-10-CM

## 2023-05-03 NOTE — Progress Notes (Signed)
 Subjective:  Patient ID: [name], female    DOB: September 21, 1950,  MRN: [id number]  Chief Complaint  Patient presents with   Toe Pain    Pt stated that she is having some pain with her big toe she was given antibiotics because of infection but she is still getting a throbbing pain     73 y.o. female presents with the above complaint.  Patient presents with right hallux ingrown painful to touch throbbing.  She was given antibiotics because of infection.  She has not seen and was prior to seeing me.  She also has vascular abnormality.  She is not a diabetic.  She has not had ABIs PVRs recently done   Review of Systems: Negative except as noted in the HPI. Denies N/V/F/Ch.  Past Medical History:  Diagnosis Date   Anemia    Arthritis    Atrial flutter (HCC) 09/2015   a.) CHADS2VASc 2 (sex, HTN); b.) s/p DCCV 11/11/2015 (150 J); c.) s/p RFCA 01/17/2016; d.) rate/rhythm maintained without pharmacological intervention; no chronic anticoagulation.   Coronary artery calcification seen on CT scan    Diastolic dysfunction 08/23/2019   a.)  TTE 08/23/2019: EF 55-60%, severe LAE, mild-mod MR, PASP 46.2, G2DD   Dyspnea    Essential hypertension    GERD (gastroesophageal reflux disease)    Hepatic steatosis    Incomplete left bundle branch block (LBBB)    Pneumonia    as a child   Subclinical hypothyroidism    Tobacco abuse     Current Outpatient Medications:    benzonatate (TESSALON) 200 MG capsule, Take 200 mg by mouth 3 (three) times daily as needed., Disp: , Rfl:    methocarbamol (ROBAXIN) 750 MG tablet, Take 750 mg by mouth every 4 (four) hours as needed., Disp: , Rfl:    predniSONE (DELTASONE) 10 MG tablet, 6 day taper - Take as directed; take as needed pain flareup, Disp: , Rfl:    pregabalin (LYRICA) 75 MG capsule, Take 75 mg by mouth 2 (two) times daily., Disp: , Rfl:    acetaminophen (TYLENOL) 500 MG tablet, Take 500 mg by mouth every 6 (six) hours as needed for moderate pain or  headache., Disp: , Rfl:    albuterol (VENTOLIN HFA) 108 (90 Base) MCG/ACT inhaler, Inhale 2-4 puffs by mouth every 4 hours as needed for wheezing, cough, and/or shortness of breath, Disp: 8 g, Rfl: 1   amLODipine (NORVASC) 10 MG tablet, Take 1 tablet (10 mg total) by mouth daily., Disp: 30 tablet, Rfl: 11   apixaban (ELIQUIS) 5 MG TABS tablet, Take 1 tablet (5 mg total) by mouth 2 (two) times daily., Disp: 60 tablet, Rfl: 1   ascorbic acid (VITAMIN C) 500 MG tablet, Take 500 mg by mouth daily., Disp: , Rfl:    aspirin EC 81 MG tablet, Take 1 tablet (81 mg total) by mouth daily. Swallow whole., Disp: , Rfl:    cholecalciferol (VITAMIN D) 1000 units tablet, Take 1,000 Units by mouth daily., Disp: , Rfl:    diclofenac Sodium (VOLTAREN) 1 % GEL, Apply 2 g topically as needed., Disp: , Rfl:    hydrALAZINE (APRESOLINE) 25 MG tablet, Take 1 tablet (25 mg total) by mouth 2 (two) times daily., Disp: 180 tablet, Rfl: 3   hydrOXYzine (ATARAX) 10 MG tablet, Take 1 tablet (10 mg total) by mouth 3 (three) times daily as needed., Disp: 30 tablet, Rfl: 0   levothyroxine (SYNTHROID, LEVOTHROID) 88 MCG tablet, Take 125 mcg by mouth  daily before breakfast., Disp: , Rfl:    losartan (COZAAR) 100 MG tablet, Take 1 tablet (100 mg total) by mouth daily., Disp: 90 tablet, Rfl: 3   meloxicam (MOBIC) 15 MG tablet, 15 mg daily., Disp: , Rfl:   Social History   Tobacco Use  Smoking Status Former   Current packs/day: 0.00   Types: Cigarettes   Quit date: 11/19/2015   Years since quitting: 7.4  Smokeless Tobacco Never    Allergies  Allergen Reactions   Lisinopril Swelling    Swelling of lips and face.    Tizanidine Other (See Comments)    bradycardia   Amoxicillin Itching and Rash    Has patient had a PCN reaction causing immediate rash, facial/tongue/throat swelling, SOB or lightheadedness with hypotension: Yes Has patient had a PCN reaction causing severe rash involving mucus membranes or skin necrosis: No Has  patient had a PCN reaction that required hospitalization No Has patient had a PCN reaction occurring within the last 10 years: Yes If all of the above answers are "NO", then may proceed with Cephalosporin use.    Objective:  There were no vitals filed for this visit. There is no height or weight on file to calculate BMI. Constitutional Well developed. Well nourished.  Vascular Dorsalis pedis pulses nonpalpable bilaterally. Posterior tibial pulses nonpalpable bilaterally. Capillary refill sluggish to all digits.  No cyanosis or clubbing noted. Pedal hair growth not present  Neurologic Normal speech. Oriented to person, place, and time. Epicritic sensation to light touch grossly present bilaterally.  Dermatologic Painful ingrowing nail at medial nail borders of the hallux nail right. No other open wounds. No skin lesions.  Orthopedic: Normal joint ROM without pain or crepitus bilaterally. No visible deformities. No bony tenderness.   Radiographs: None Assessment:   1. Vascular abnormality   2. Ingrown right big toenail    Plan:  Patient was evaluated and treated and all questions answered.  Ingrown Nail, right with underlying vascular abnormality -At this time I discussed with the patient given the possible concern for underlying vascular abnormality we will hold off on removing the ingrown as patient has a high risk of amputation if it proceed with ingrown. -ABIs PVRs were ordered and if within normal limits we will plan on doing ingrown nail procedure if not patient will benefit from vascular consult  No follow-ups on file.

## 2023-05-10 NOTE — H&P (View-Only) (Signed)
  Electrophysiology Office Follow up Visit Note:    Date:  05/11/2023   ID:  [name], DOB [birth year], MRN [id number]  PCP:  [facility]  [facility] Cardiologist:  Lorine Bears, MD  [facility] Electrophysiologist:  Lanier Prude, MD    Interval History:     [name] is a 73 y.o. female who presents for a follow up visit.   I last saw the patient in March 23, 2023 for paroxysmal atrial fibrillation and flutter.  The patient had a prior atrial flutter ablation in 2017.  We planned to implant a loop recorder to help monitor her atrial fibrillation and flutter burden and to help guide therapy.  She presents today to have her loop recorder implanted.  Since I last saw her she has been to the emergency department twice.  The first visit was April 01, 2023.  She was brought in by EMS.  EMS EKG in the chart shows atrial fibrillation with a rapid ventricular rate of 112 bpm.      Past medical, surgical, social and family history were reviewed.  ROS:   Please see the history of present illness.    All other systems reviewed and are negative.  EKGs/Labs/Other Studies Reviewed:    The following studies were reviewed today:  April 01, 2023 EMS records         Physical Exam:    VS:  BP (!) 145/72 (BP Location: Left Arm, Patient Position: Sitting, Cuff Size: Normal)   Pulse 68   Ht 5\' 5"  (1.651 m)   Wt 177 lb 6.4 oz (80.5 kg)   SpO2 98%   BMI 29.52 kg/m     Wt Readings from Last 3 Encounters:  05/11/23 177 lb 6.4 oz (80.5 kg)  04/15/23 170 lb (77.1 kg)  03/23/23 175 lb 3.2 oz (79.5 kg)     GEN: no distress CARD: RRR, No MRG RESP: No IWOB. CTAB.      ASSESSMENT:    1. Typical atrial flutter (HCC)   2. Paroxysmal A-fib (HCC)    PLAN:    In order of problems listed above:  #Paroxysmal atrial fibrillation #Atrial flutter, typical Symptomatic and recurrent.  She is gone to the emergency department twice in January  for symptomatic atrial fibrillation with rapid ventricular rates.  We discussed treatment options for her atrial fibrillation today.  We discussed antiarrhythmic drugs, catheter ablation and conservative management.  Discussed treatment options today for AF including antiarrhythmic drug therapy and ablation. Discussed risks, recovery and likelihood of success with each treatment strategy. Risk, benefits, and alternatives to EP study and ablation for afib were discussed. These risks include but are not limited to stroke, bleeding, vascular damage, tamponade, perforation, damage to the esophagus, lungs, phrenic nerve and other structures, pulmonary vein stenosis, worsening renal function, coronary vasospasm and death.  Discussed potential need for repeat ablation procedures and antiarrhythmic drugs after an initial ablation. The patient understands these risk and wishes to proceed.  We will therefore proceed with catheter ablation at the next available time.  Carto, ICE, anesthesia are requested for the procedure.  Will also obtain CT PV protocol prior to the procedure to exclude LAA thrombus and further evaluate atrial anatomy.  Continue Eliquis 5 mg by mouth twice daily for stroke prophylaxis.    Signed, Steffanie Dunn, MD, [facility], [facility] 05/11/2023 8:38 AM    Electrophysiology [facility]

## 2023-05-10 NOTE — Progress Notes (Unsigned)
  Electrophysiology Office Follow up Visit Note:    Date:  05/11/2023   ID:  [name], DOB [birth year], MRN [id number]  PCP:  [facility]  [facility] Cardiologist:  Lorine Bears, MD  [facility] Electrophysiologist:  Lanier Prude, MD    Interval History:     [name] is a 73 y.o. female who presents for a follow up visit.   I last saw the patient in March 23, 2023 for paroxysmal atrial fibrillation and flutter.  The patient had a prior atrial flutter ablation in 2017.  We planned to implant a loop recorder to help monitor her atrial fibrillation and flutter burden and to help guide therapy.  She presents today to have her loop recorder implanted.  Since I last saw her she has been to the emergency department twice.  The first visit was April 01, 2023.  She was brought in by EMS.  EMS EKG in the chart shows atrial fibrillation with a rapid ventricular rate of 112 bpm.      Past medical, surgical, social and family history were reviewed.  ROS:   Please see the history of present illness.    All other systems reviewed and are negative.  EKGs/Labs/Other Studies Reviewed:    The following studies were reviewed today:  April 01, 2023 EMS records         Physical Exam:    VS:  BP (!) 145/72 (BP Location: Left Arm, Patient Position: Sitting, Cuff Size: Normal)   Pulse 68   Ht 5\' 5"  (1.651 m)   Wt 177 lb 6.4 oz (80.5 kg)   SpO2 98%   BMI 29.52 kg/m     Wt Readings from Last 3 Encounters:  05/11/23 177 lb 6.4 oz (80.5 kg)  04/15/23 170 lb (77.1 kg)  03/23/23 175 lb 3.2 oz (79.5 kg)     GEN: no distress CARD: RRR, No MRG RESP: No IWOB. CTAB.      ASSESSMENT:    1. Typical atrial flutter (HCC)   2. Paroxysmal A-fib (HCC)    PLAN:    In order of problems listed above:  #Paroxysmal atrial fibrillation #Atrial flutter, typical Symptomatic and recurrent.  She is gone to the emergency department twice in January  for symptomatic atrial fibrillation with rapid ventricular rates.  We discussed treatment options for her atrial fibrillation today.  We discussed antiarrhythmic drugs, catheter ablation and conservative management.  Discussed treatment options today for AF including antiarrhythmic drug therapy and ablation. Discussed risks, recovery and likelihood of success with each treatment strategy. Risk, benefits, and alternatives to EP study and ablation for afib were discussed. These risks include but are not limited to stroke, bleeding, vascular damage, tamponade, perforation, damage to the esophagus, lungs, phrenic nerve and other structures, pulmonary vein stenosis, worsening renal function, coronary vasospasm and death.  Discussed potential need for repeat ablation procedures and antiarrhythmic drugs after an initial ablation. The patient understands these risk and wishes to proceed.  We will therefore proceed with catheter ablation at the next available time.  Carto, ICE, anesthesia are requested for the procedure.  Will also obtain CT PV protocol prior to the procedure to exclude LAA thrombus and further evaluate atrial anatomy.  Continue Eliquis 5 mg by mouth twice daily for stroke prophylaxis.    Signed, Steffanie Dunn, MD, [facility], [facility] 05/11/2023 8:38 AM    Electrophysiology [facility]

## 2023-05-11 ENCOUNTER — Ambulatory Visit: Payer: 59 | Attending: Cardiology | Admitting: Cardiology

## 2023-05-11 ENCOUNTER — Other Ambulatory Visit: Payer: Self-pay

## 2023-05-11 ENCOUNTER — Encounter: Payer: Self-pay | Admitting: Cardiology

## 2023-05-11 VITALS — BP 145/72 | HR 68 | Ht 65.0 in | Wt 177.4 lb

## 2023-05-11 DIAGNOSIS — I483 Typical atrial flutter: Secondary | ICD-10-CM

## 2023-05-11 DIAGNOSIS — I480 Paroxysmal atrial fibrillation: Secondary | ICD-10-CM

## 2023-05-11 NOTE — Patient Instructions (Signed)
 Medication Instructions:  Your physician has recommended you make the following change in your medication:  1) STOP taking aspirin *If you need a refill on your cardiac medications before your next appointment, please call your pharmacy*  Lab Work: BMET and CBC - prior to your ablation  Testing/Procedures: Cardiac CT  Your physician has requested that you have cardiac CT. Cardiac computed tomography (CT) is a painless test that uses an x-ray machine to take clear, detailed pictures of your heart. For further information please visit [email or web address] Please follow instruction sheet as given. We will call you to schedule your CT scan. It will be done about three weeks prior to your ablation.  Ablation  Your physician has recommended that you have an ablation. Catheter ablation is a medical procedure used to treat some cardiac arrhythmias (irregular heartbeats). During catheter ablation, a long, thin, flexible tube is put into a blood vessel in your groin (upper thigh), or neck. This tube is called an ablation catheter. It is then guided to your heart through the blood vessel. Radio frequency waves destroy small areas of heart tissue where abnormal heartbeats may cause an arrhythmia to start. Please see the instruction sheet given to you today.  Follow-Up: At [facility], you and your health needs are our priority.  As part of our continuing mission to provide you with exceptional heart care, we have created designated Provider Care Teams.  These Care Teams include your primary Cardiologist (physician) and Advanced Practice Providers (APPs -  Physician Assistants and Nurse Practitioners) who all work together to provide you with the care you need, when you need it.   Your next appointment:   We will call you to arrange your follow up appointments

## 2023-05-12 LAB — BASIC METABOLIC PANEL
BUN/Creatinine Ratio: 21 (ref 12–28)
BUN: 16 mg/dL (ref 8–27)
CO2: 24 mmol/L (ref 20–29)
Calcium: 10.1 mg/dL (ref 8.7–10.3)
Chloride: 103 mmol/L (ref 96–106)
Creatinine, Ser: 0.77 mg/dL (ref 0.57–1.00)
Glucose: 116 mg/dL — ABNORMAL HIGH (ref 70–99)
Potassium: 4.2 mmol/L (ref 3.5–5.2)
Sodium: 145 mmol/L — ABNORMAL HIGH (ref 134–144)
eGFR: 83 mL/min/{1.73_m2} (ref >59)

## 2023-05-12 LAB — CBC
Hematocrit: 44.8 % (ref 34.0–46.6)
Hemoglobin: 13.4 g/dL (ref 11.1–15.9)
MCH: 23.2 pg — ABNORMAL LOW (ref 26.6–33.0)
MCHC: 29.9 g/dL — ABNORMAL LOW (ref 31.5–35.7)
MCV: 78 fL — ABNORMAL LOW (ref 79–97)
Platelets: 224 10*3/uL (ref 150–450)
RBC: 5.77 x10E6/uL — ABNORMAL HIGH (ref 3.77–5.28)
RDW: 14.8 % (ref 11.7–15.4)
WBC: 8 10*3/uL (ref 3.4–10.8)

## 2023-05-13 ENCOUNTER — Other Ambulatory Visit: Payer: Self-pay | Admitting: Internal Medicine

## 2023-05-13 ENCOUNTER — Ambulatory Visit
Admission: RE | Admit: 2023-05-13 | Discharge: 2023-05-13 | Disposition: A | Discharge status: Home / Self Care | Payer: 59 | Source: Ambulatory Visit | Attending: Internal Medicine | Admitting: Internal Medicine

## 2023-05-13 DIAGNOSIS — M79676 Pain in unspecified toe(s): Secondary | ICD-10-CM

## 2023-05-16 ENCOUNTER — Ambulatory Visit
Admission: RE | Admit: 2023-05-16 | Discharge: 2023-05-16 | Disposition: A | Discharge status: Home / Self Care | Payer: 59 | Source: Ambulatory Visit | Attending: Cardiology | Admitting: Cardiology

## 2023-05-16 DIAGNOSIS — I483 Typical atrial flutter: Secondary | ICD-10-CM

## 2023-05-16 DIAGNOSIS — I480 Paroxysmal atrial fibrillation: Secondary | ICD-10-CM

## 2023-05-16 MED ORDER — IOHEXOL 350 MG/ML SOLN
75.0000 mL | Freq: Once | INTRAVENOUS | Status: AC | PRN
  Administered 2023-05-16: 75 mL via INTRAVENOUS

## 2023-05-16 MED ORDER — SODIUM CHLORIDE 0.9 % IV SOLN: INTRAVENOUS | Status: DC

## 2023-05-17 ENCOUNTER — Telehealth (HOSPITAL_COMMUNITY): Payer: Self-pay

## 2023-05-17 ENCOUNTER — Ambulatory Visit (INDEPENDENT_AMBULATORY_CARE_PROVIDER_SITE_OTHER): Admitting: Podiatry

## 2023-05-17 DIAGNOSIS — L600 Ingrowing nail: Secondary | ICD-10-CM | POA: Diagnosis not present

## 2023-05-17 DIAGNOSIS — I999 Unspecified disorder of circulatory system: Secondary | ICD-10-CM

## 2023-05-17 MED ORDER — LIDOCAINE 5 % EX OINT: 1.0000 | TOPICAL_OINTMENT | CUTANEOUS | 0 refills | Status: AC | PRN

## 2023-05-17 NOTE — Telephone Encounter (Signed)
 Attempted to reach patient to discuss upcoming procedure, no answer. Left VM for patient to return call.

## 2023-05-17 NOTE — Telephone Encounter (Signed)
 Call placed to patient to discuss upcoming procedure.   CT: completed and acceptable.  Labs: completed and acceptable.   Any recent signs of acute illness or been started on antibiotics? No. Completed antibiotics on 04/26/23 for ingrown toenail.  Any diabetic medications to hold?  No Any missed doses of blood thinner?  NO Advised patient to continue taking ANTICOAGULANT: Eliquis (Apixaban) without missing any doses.  Medication instructions:  On the morning of your procedure DO NOT take any medication., including Eliquis or the procedure may be rescheduled. Nothing to eat or drink after midnight prior to your procedure.  Confirmed patient is scheduled for Atrial Fibrillation Ablation on Tuesday, March 11 with Dr. Steffanie Dunn. Instructed patient to arrive at the Main Entrance A at [facility]: [address] [place], [place] [address] and check in at Admitting at 8:00 AM  Advised of plan to go home the same day and will only stay overnight if medically necessary. You MUST have a responsible adult to drive you home and MUST be with you the first 24 hours after you arrive home or your procedure could be cancelled.  Patient verbalized understanding to all instructions provided and agreed to proceed with procedure.

## 2023-05-17 NOTE — Addendum Note (Signed)
 Addended by: Primitivo Gauze on: 05/17/2023 02:43 PM   Modules accepted: Orders

## 2023-05-17 NOTE — Progress Notes (Signed)
 Subjective:  Patient ID: [name], female    DOB: 1951/03/14,  MRN: [id number]  Chief Complaint  Patient presents with   Toe Pain    Right big toe pain  Pt stated that it is better than it was but she still has some discomfort she stated that she had a xray done yesterday on her toe     73 y.o. female presents with the above complaint.  Patient presents with right hallux ingrown painful to touch throbbing.  She was given antibiotics because of infection.  She has not seen and was prior to seeing me.  She also has vascular abnormality.  She is not a diabetic.  She has not had ABIs PVRs recently done   Review of Systems: Negative except as noted in the HPI. Denies N/V/F/Ch.  Past Medical History:  Diagnosis Date   Anemia    Arthritis    Atrial flutter (HCC) 09/2015   a.) CHADS2VASc 2 (sex, HTN); b.) s/p DCCV 11/11/2015 (150 J); c.) s/p RFCA 01/17/2016; d.) rate/rhythm maintained without pharmacological intervention; no chronic anticoagulation.   Coronary artery calcification seen on CT scan    Diastolic dysfunction 08/23/2019   a.)  TTE 08/23/2019: EF 55-60%, severe LAE, mild-mod MR, PASP 46.2, G2DD   Dyspnea    Essential hypertension    GERD (gastroesophageal reflux disease)    Hepatic steatosis    Incomplete left bundle branch block (LBBB)    Pneumonia    as a child   Subclinical hypothyroidism    Tobacco abuse     Current Outpatient Medications:    sulfamethoxazole-trimethoprim (BACTRIM DS) 800-160 MG tablet, Take 1 tablet by mouth 2 (two) times daily., Disp: , Rfl:    acetaminophen (TYLENOL) 500 MG tablet, Take 500 mg by mouth every 6 (six) hours as needed for moderate pain or headache., Disp: , Rfl:    albuterol (VENTOLIN HFA) 108 (90 Base) MCG/ACT inhaler, Inhale 2-4 puffs by mouth every 4 hours as needed for wheezing, cough, and/or shortness of breath, Disp: 8 g, Rfl: 1   amLODipine (NORVASC) 10 MG tablet, Take 1 tablet (10 mg total) by mouth daily., Disp: 30  tablet, Rfl: 11   apixaban (ELIQUIS) 5 MG TABS tablet, Take 1 tablet (5 mg total) by mouth 2 (two) times daily., Disp: 60 tablet, Rfl: 1   ascorbic acid (VITAMIN C) 500 MG tablet, Take 500 mg by mouth daily., Disp: , Rfl:    cholecalciferol (VITAMIN D) 1000 units tablet, Take 1,000 Units by mouth daily., Disp: , Rfl:    diclofenac Sodium (VOLTAREN) 1 % GEL, Apply 2 g topically as needed., Disp: , Rfl:    hydrOXYzine (ATARAX) 10 MG tablet, Take 1 tablet (10 mg total) by mouth 3 (three) times daily as needed., Disp: 30 tablet, Rfl: 0   levothyroxine (SYNTHROID, LEVOTHROID) 88 MCG tablet, Take 125 mcg by mouth daily before breakfast., Disp: , Rfl:    losartan (COZAAR) 100 MG tablet, Take 1 tablet (100 mg total) by mouth daily., Disp: 90 tablet, Rfl: 3   meloxicam (MOBIC) 15 MG tablet, 15 mg daily., Disp: , Rfl:    methocarbamol (ROBAXIN) 750 MG tablet, Take 750 mg by mouth every 4 (four) hours as needed., Disp: , Rfl:   Social History   Tobacco Use  Smoking Status Former   Current packs/day: 0.00   Types: Cigarettes   Quit date: 11/19/2015   Years since quitting: 7.4  Smokeless Tobacco Never    Allergies  Allergen Reactions  Lisinopril Swelling    Swelling of lips and face.    Tizanidine Other (See Comments)    bradycardia   Amoxicillin Itching and Rash    Has patient had a PCN reaction causing immediate rash, facial/tongue/throat swelling, SOB or lightheadedness with hypotension: Yes Has patient had a PCN reaction causing severe rash involving mucus membranes or skin necrosis: No Has patient had a PCN reaction that required hospitalization No Has patient had a PCN reaction occurring within the last 10 years: Yes If all of the above answers are "NO", then may proceed with Cephalosporin use.    Objective:  There were no vitals filed for this visit. There is no height or weight on file to calculate BMI. Constitutional Well developed. Well nourished.  Vascular Dorsalis pedis pulses  nonpalpable bilaterally. Posterior tibial pulses nonpalpable bilaterally. Capillary refill sluggish to all digits.  No cyanosis or clubbing noted. Pedal hair growth not present  Neurologic Normal speech. Oriented to person, place, and time. Epicritic sensation to light touch grossly present bilaterally.  Dermatologic Painful ingrowing nail at medial nail borders of the hallux nail right. No other open wounds. No skin lesions.  Orthopedic: Normal joint ROM without pain or crepitus bilaterally. No visible deformities. No bony tenderness.   Radiographs: None Assessment:   No diagnosis found.  Plan:  Patient was evaluated and treated and all questions answered.  Ingrown Nail, right with underlying vascular abnormality -At this time I discussed with the patient given the possible concern for underlying vascular abnormality we will hold off on removing the ingrown as patient has a high risk of amputation if it proceed with ingrown. -Slant back was performed to give her temporary relief -Awaiting ABIs PVRs prior to doing an ingrown nail procedure she is scheduled for end of March  No follow-ups on file.

## 2023-05-18 ENCOUNTER — Telehealth: Payer: Self-pay | Admitting: Podiatry

## 2023-05-18 NOTE — Telephone Encounter (Signed)
 Called and left patient a message that the lidocaine cream was sent to the pharmacy yesterday.

## 2023-05-18 NOTE — Telephone Encounter (Signed)
 Patient called and said she was seen yesterday, and that she is in pain and would like to see if her medication was sent it to the [facility] in [place] on [address]. She mentioned a cream and pain medicine. Thank you.

## 2023-05-20 ENCOUNTER — Telehealth: Payer: Self-pay | Admitting: Cardiovascular Disease

## 2023-05-20 ENCOUNTER — Ambulatory Visit: Payer: 59

## 2023-05-20 NOTE — Telephone Encounter (Signed)
 Per Dr. Lalla Brothers patient needs to follow up with her PCP for MRI. Patient has been made aware and verbalized understanding

## 2023-05-20 NOTE — Telephone Encounter (Signed)
 Call transferred directly to this RN by the call center.   Elnita Maxwell with [facility] calling in with a abnormal over read by the radiologist.    Per Elnita Maxwell: Minimally covered liver shows multiple hypervascular nodules. No available comparison imaging, recommend liver MRI with contrast and correlation for chronic liver disease/cirrhosis risk factors.   Reviewed with the patient's chart.  Her Cardiac CT was a pre-work up for her pending a-fib ablation that is scheduled on 05/24/23 with Dr. Lalla Brothers.    Forwarding to ordering provider for further recommendations.

## 2023-05-20 NOTE — Telephone Encounter (Signed)
 Caller Elnita Maxwell) is reporting out of range results.

## 2023-05-23 NOTE — Pre-Procedure Instructions (Signed)
 Attempted to call patient regarding procedure instructions.  Left voicemail on  the following items: Arrival time 0800 Nothing to eat or drink after midnight No meds AM of procedure Responsible person to drive you home and stay with you for 24 hrs  Have you missed any doses of anti-coagulant Eliquis- should  be taken twice a day, if you have missed any doses please let us know.  Don't take morning of procedure.

## 2023-05-24 ENCOUNTER — Ambulatory Visit (HOSPITAL_COMMUNITY)

## 2023-05-24 ENCOUNTER — Other Ambulatory Visit: Payer: Self-pay

## 2023-05-24 ENCOUNTER — Ambulatory Visit (HOSPITAL_BASED_OUTPATIENT_CLINIC_OR_DEPARTMENT_OTHER)

## 2023-05-24 ENCOUNTER — Other Ambulatory Visit (HOSPITAL_COMMUNITY): Payer: Self-pay

## 2023-05-24 ENCOUNTER — Ambulatory Visit (HOSPITAL_COMMUNITY)
Admission: RE | Admit: 2023-05-24 | Discharge: 2023-05-24 | Disposition: A | Discharge status: Home / Self Care | Payer: 59 | Attending: Cardiology | Admitting: Cardiology

## 2023-05-24 ENCOUNTER — Encounter (HOSPITAL_COMMUNITY)
Admission: RE | Disposition: A | Discharge status: Home / Self Care | Payer: Self-pay | Source: Home / Self Care | Attending: Cardiology

## 2023-05-24 DIAGNOSIS — I4819 Other persistent atrial fibrillation: Secondary | ICD-10-CM | POA: Diagnosis present

## 2023-05-24 DIAGNOSIS — I4891 Unspecified atrial fibrillation: Secondary | ICD-10-CM | POA: Diagnosis not present

## 2023-05-24 DIAGNOSIS — I2510 Atherosclerotic heart disease of native coronary artery without angina pectoris: Secondary | ICD-10-CM

## 2023-05-24 DIAGNOSIS — K219 Gastro-esophageal reflux disease without esophagitis: Secondary | ICD-10-CM | POA: Insufficient documentation

## 2023-05-24 DIAGNOSIS — I483 Typical atrial flutter: Secondary | ICD-10-CM | POA: Diagnosis not present

## 2023-05-24 DIAGNOSIS — Z7901 Long term (current) use of anticoagulants: Secondary | ICD-10-CM | POA: Diagnosis not present

## 2023-05-24 DIAGNOSIS — J449 Chronic obstructive pulmonary disease, unspecified: Secondary | ICD-10-CM | POA: Insufficient documentation

## 2023-05-24 DIAGNOSIS — E039 Hypothyroidism, unspecified: Secondary | ICD-10-CM | POA: Insufficient documentation

## 2023-05-24 DIAGNOSIS — Z87891 Personal history of nicotine dependence: Secondary | ICD-10-CM

## 2023-05-24 DIAGNOSIS — I10 Essential (primary) hypertension: Secondary | ICD-10-CM | POA: Diagnosis not present

## 2023-05-24 HISTORY — PX: ATRIAL FIBRILLATION ABLATION: EP1191

## 2023-05-24 SURGERY — ATRIAL FIBRILLATION ABLATION
Anesthesia: General

## 2023-05-24 MED ORDER — PROTAMINE SULFATE 10 MG/ML IV SOLN
INTRAVENOUS | Status: DC | PRN
  Administered 2023-05-24: 35 mg via INTRAVENOUS

## 2023-05-24 MED ORDER — APIXABAN 5 MG PO TABS
5.0000 mg | ORAL_TABLET | Freq: Two times a day (BID) | ORAL | Status: DC
  Administered 2023-05-24: 5 mg via ORAL
  Filled 2023-05-24: qty 1

## 2023-05-24 MED ORDER — DEXAMETHASONE SODIUM PHOSPHATE 10 MG/ML IJ SOLN
INTRAMUSCULAR | Status: DC | PRN
Start: 2023-05-24 — End: 2023-05-24
  Administered 2023-05-24: 5 mg via INTRAVENOUS

## 2023-05-24 MED ORDER — ACETAMINOPHEN 325 MG PO TABS: 650.0000 mg | ORAL_TABLET | ORAL | Status: DC | PRN

## 2023-05-24 MED ORDER — ACETAMINOPHEN 325 MG PO TABS
ORAL_TABLET | ORAL | Status: AC
  Administered 2023-05-24: 650 mg via ORAL
  Filled 2023-05-24: qty 2

## 2023-05-24 MED ORDER — FENTANYL CITRATE (PF) 250 MCG/5ML IJ SOLN
INTRAMUSCULAR | Status: DC | PRN
  Administered 2023-05-24 (×2): 50 ug via INTRAVENOUS

## 2023-05-24 MED ORDER — OXYCODONE HCL 5 MG PO TABS: 5.0000 mg | ORAL_TABLET | Freq: Once | ORAL | Status: DC | PRN

## 2023-05-24 MED ORDER — LIDOCAINE 2% (20 MG/ML) 5 ML SYRINGE
INTRAMUSCULAR | Status: DC | PRN
  Administered 2023-05-24: 60 mg via INTRAVENOUS

## 2023-05-24 MED ORDER — ONDANSETRON HCL 4 MG/2ML IJ SOLN
INTRAMUSCULAR | Status: DC | PRN
  Administered 2023-05-24: 4 mg via INTRAVENOUS

## 2023-05-24 MED ORDER — ONDANSETRON HCL 4 MG/2ML IJ SOLN: 4.0000 mg | Freq: Four times a day (QID) | INTRAMUSCULAR | Status: DC | PRN

## 2023-05-24 MED ORDER — SODIUM CHLORIDE 0.9 % IV SOLN: 250.0000 mL | INTRAVENOUS | Status: DC | PRN

## 2023-05-24 MED ORDER — HEPARIN (PORCINE) IN NACL 1000-0.9 UT/500ML-% IV SOLN
INTRAVENOUS | Status: DC | PRN
Start: 2023-05-24 — End: 2023-05-24
  Administered 2023-05-24 (×4): 500 mL

## 2023-05-24 MED ORDER — ATROPINE SULFATE 1 MG/ML IV SOLN
INTRAVENOUS | Status: DC | PRN
  Administered 2023-05-24: 1 mg via INTRAVENOUS

## 2023-05-24 MED ORDER — COLCHICINE 0.6 MG PO TABS
0.6000 mg | ORAL_TABLET | Freq: Two times a day (BID) | ORAL | Status: DC
  Administered 2023-05-24: 0.6 mg via ORAL
  Filled 2023-05-24: qty 1

## 2023-05-24 MED ORDER — SODIUM CHLORIDE 0.9% FLUSH: 3.0000 mL | Freq: Two times a day (BID) | INTRAVENOUS | Status: DC

## 2023-05-24 MED ORDER — PHENYLEPHRINE HCL-NACL 20-0.9 MG/250ML-% IV SOLN
INTRAVENOUS | Status: DC | PRN
  Administered 2023-05-24: 30 ug/min via INTRAVENOUS

## 2023-05-24 MED ORDER — PROPOFOL 10 MG/ML IV BOLUS
INTRAVENOUS | Status: DC | PRN
  Administered 2023-05-24: 100 mg via INTRAVENOUS

## 2023-05-24 MED ORDER — ROCURONIUM BROMIDE 10 MG/ML (PF) SYRINGE
PREFILLED_SYRINGE | INTRAVENOUS | Status: DC | PRN
  Administered 2023-05-24: 60 mg via INTRAVENOUS
  Administered 2023-05-24: 10 mg via INTRAVENOUS

## 2023-05-24 MED ORDER — FENTANYL CITRATE (PF) 100 MCG/2ML IJ SOLN
INTRAMUSCULAR | Status: AC
  Filled 2023-05-24: qty 2

## 2023-05-24 MED ORDER — OXYCODONE HCL 5 MG/5ML PO SOLN: 5.0000 mg | Freq: Once | ORAL | Status: DC | PRN

## 2023-05-24 MED ORDER — PANTOPRAZOLE SODIUM 40 MG PO TBEC
40.0000 mg | DELAYED_RELEASE_TABLET | Freq: Every day | ORAL | Status: DC
Start: 2023-05-24 — End: 2023-05-24
  Administered 2023-05-24: 40 mg via ORAL
  Filled 2023-05-24: qty 1

## 2023-05-24 MED ORDER — HEPARIN SODIUM (PORCINE) 1000 UNIT/ML IJ SOLN
INTRAMUSCULAR | Status: DC | PRN
  Administered 2023-05-24: 3000 [IU] via INTRAVENOUS
  Administered 2023-05-24: 13000 [IU] via INTRAVENOUS

## 2023-05-24 MED ORDER — PANTOPRAZOLE SODIUM 40 MG PO TBEC
40.0000 mg | DELAYED_RELEASE_TABLET | Freq: Every day | ORAL | 0 refills | Status: AC
  Filled 2023-05-24: qty 45, 45d supply, fill #0

## 2023-05-24 MED ORDER — SODIUM CHLORIDE 0.9% FLUSH: 3.0000 mL | INTRAVENOUS | Status: DC | PRN

## 2023-05-24 MED ORDER — COLCHICINE 0.6 MG PO TABS
0.6000 mg | ORAL_TABLET | Freq: Two times a day (BID) | ORAL | 0 refills | Status: DC
  Filled 2023-05-24: qty 10, 5d supply, fill #0

## 2023-05-24 MED ORDER — SODIUM CHLORIDE 0.9 % IV SOLN: INTRAVENOUS | Status: DC

## 2023-05-24 NOTE — Discharge Instructions (Signed)

## 2023-05-24 NOTE — Anesthesia Postprocedure Evaluation (Signed)
 Anesthesia Post Note  Patient: [name]  Procedure(s) Performed: ATRIAL FIBRILLATION ABLATION     Patient location during evaluation: Phase II Anesthesia Type: General Level of consciousness: awake and alert, patient cooperative and oriented Pain management: pain level controlled Vital Signs Assessment: post-procedure vital signs reviewed and stable Respiratory status: spontaneous breathing, nonlabored ventilation and respiratory function stable Cardiovascular status: blood pressure returned to baseline and stable Postop Assessment: no apparent nausea or vomiting and adequate PO intake Anesthetic complications: no   No notable events documented.  Last Vitals:  Vitals:   05/24/23 1205 05/24/23 1221  BP: 123/87 133/78  Pulse: 82 76  Resp: 12 19  Temp: 36.5 C   SpO2: 94% 93%    Last Pain:  Vitals:   05/24/23 1221  TempSrc:   PainSc: 2                  Kinzly Pierrelouis,E. Jahid Weida

## 2023-05-24 NOTE — Progress Notes (Signed)
 Patient walked to the bathroom without difficulties. Bilateral groins level 0, clean, dry, and intact.

## 2023-05-24 NOTE — Anesthesia Preprocedure Evaluation (Addendum)
 Anesthesia Evaluation  Patient identified by MRN, date of birth, ID band Patient awake    Reviewed: Allergy & Precautions, NPO status , Patient's Chart, lab work & pertinent test results  History of Anesthesia Complications Negative for: history of anesthetic complications  Airway Mallampati: II  TM Distance: >3 FB Neck ROM: Full    Dental  (+) Poor Dentition, Missing, Dental Advisory Given   Pulmonary COPD,  COPD inhaler, former smoker   breath sounds clear to auscultation       Cardiovascular hypertension, Pt. on medications (-) angina + CAD (calcifications)  + dysrhythmias Atrial Fibrillation  Rhythm:Irregular Rate:Normal  10/2022 ECHO: EF 55 to 60%. 1. The LV has normal function, no regional wall motion abnormalities. Grade II diastolic dysfunction (pseudonormalization). Elevated left atrial pressure. The  average left ventricular global longitudinal strain is -12.7 %. The global  longitudinal strain is abnormal.   2. RVF is normal. The right ventricular size is normal. Mildly increased right ventricular wall thickness. There is normal pulmonary artery systolic pressure.   3. Left atrial size was severely dilated.   4. Right atrial size was moderately dilated.   5. A small pericardial effusion is present. The pericardial effusion is localized near the right atrium and anterior to the right ventricle.   6. The MV is grossly normal. Mild to moderate mitral valve regurgitation. No evidence of mitral stenosis.   7. The aortic valve was not well visualized. Aortic valve regurgitation is not visualized. No aortic stenosis is present.     Neuro/Psych negative neurological ROS     GI/Hepatic Neg liver ROS,GERD  Medicated and Controlled,,  Endo/Other  Hypothyroidism    Renal/GU negative Renal ROS     Musculoskeletal  (+) Arthritis ,    Abdominal   Peds  Hematology eliquis   Anesthesia Other Findings    Reproductive/Obstetrics                             Anesthesia Physical Anesthesia Plan  ASA: 3  Anesthesia Plan: General   Post-op Pain Management: Tylenol PO (pre-op)*   Induction: Intravenous  PONV Risk Score and Plan: 3 and Ondansetron and Dexamethasone  Airway Management Planned: Oral ETT  Additional Equipment: None  Intra-op Plan:   Post-operative Plan: Extubation in OR  Informed Consent: I have reviewed the patients History and Physical, chart, labs and discussed the procedure including the risks, benefits and alternatives for the proposed anesthesia with the patient or authorized representative who has indicated his/her understanding and acceptance.     Dental advisory given  Plan Discussed with: CRNA and Surgeon  Anesthesia Plan Comments:         Anesthesia Quick Evaluation

## 2023-05-24 NOTE — Interval H&P Note (Signed)
 History and Physical Interval Note:  05/24/2023 9:24 AM  [name]  has presented today for surgery, with the diagnosis of afib.  The various methods of treatment have been discussed with the patient and family. After consideration of risks, benefits and other options for treatment, the patient has consented to  Procedure(s): ATRIAL FIBRILLATION ABLATION (N/A) as a surgical intervention.  The patient's history has been reviewed, patient examined, no change in status, stable for surgery.  I have reviewed the patient's chart and labs.  Questions were answered to the patient's satisfaction.     Jacoby Zanni T Fritzie Prioleau

## 2023-05-24 NOTE — Anesthesia Procedure Notes (Signed)
 Procedure Name: Intubation Date/Time: 05/24/2023 9:57 AM  Performed by: April Holding, CRNAPre-anesthesia Checklist: Patient identified, Emergency Drugs available, Suction available and Patient being monitored Patient Re-evaluated:Patient Re-evaluated prior to induction Oxygen Delivery Method: Circle System Utilized Preoxygenation: Pre-oxygenation with 100% oxygen Induction Type: IV induction Ventilation: Mask ventilation without difficulty Laryngoscope Size: Miller and 2 Grade View: Grade I Tube type: Oral Tube size: 7.0 mm Number of attempts: 1 Airway Equipment and Method: Stylet and Bite block Placement Confirmation: ETT inserted through vocal cords under direct vision, positive ETCO2 and breath sounds checked- equal and bilateral Secured at: 22 cm Tube secured with: Tape Dental Injury: Teeth and Oropharynx as per pre-operative assessment

## 2023-05-24 NOTE — Transfer of Care (Signed)
 Immediate Anesthesia Transfer of Care Note  Patient: [name]  Procedure(s) Performed: ATRIAL FIBRILLATION ABLATION  Patient Location: Cath Lab  Anesthesia Type:General  Level of Consciousness: awake, alert , and oriented  Airway & Oxygen Therapy: Patient Spontanous Breathing  Post-op Assessment: Report given to RN and Post -op Vital signs reviewed and stable  Post vital signs: Reviewed and stable  Last Vitals:  Vitals Value Taken Time  BP 150/78 05/24/23 1135  Temp    Pulse    Resp 17 05/24/23 1137  SpO2    Vitals shown include unfiled device data.  Last Pain:  Vitals:   05/24/23 0819  TempSrc: Oral  PainSc:          Complications: No notable events documented.

## 2023-05-25 ENCOUNTER — Telehealth (HOSPITAL_COMMUNITY): Payer: Self-pay

## 2023-05-25 ENCOUNTER — Encounter (HOSPITAL_COMMUNITY): Payer: Self-pay | Admitting: Cardiology

## 2023-05-25 LAB — POCT ACTIVATED CLOTTING TIME: Activated Clotting Time: 325 s

## 2023-05-25 MED FILL — Fentanyl Citrate Preservative Free (PF) Inj 100 MCG/2ML: INTRAMUSCULAR | Qty: 2 | Status: AC

## 2023-05-25 NOTE — Telephone Encounter (Signed)
 Attempted to reach patient to follow up with procedure completed on 05/24/23, no answer. Left VM for patient to return call.

## 2023-05-25 NOTE — Telephone Encounter (Signed)
 Spoke with patient to complete post procedure follow up call.  Patient reports no complications with groin sites.   Instructions reviewed with patient:  Remove large bandage at puncture site after 24 hours. It is normal to have bruising, tenderness and a pea or marble sized lump/knot at the groin site which can take up to three months to resolve.  Get help right away if you notice sudden swelling at the puncture site.  Check your puncture site every day for signs of infection: fever, redness, swelling, pus drainage, warmth, foul odor or excessive pain. If this occurs, please call the office at [phone], to speak with the nurse. Get help right away if your puncture site is bleeding and the bleeding does not stop after applying firm pressure to the area.  You may continue to have skipped beats/ atrial fibrillation during the first several months after your procedure.  It is very important not to miss any doses of your blood thinner Eliquis. Patient restarted taking this medication on yesterday, 05/24/23   You will follow up with the APP on 06/21/23 and follow up with the APP on 08/29/23.   Patient verbalized understanding to all instructions provided.

## 2023-06-06 DIAGNOSIS — I25118 Atherosclerotic heart disease of native coronary artery with other forms of angina pectoris: Secondary | ICD-10-CM | POA: Insufficient documentation

## 2023-06-06 DIAGNOSIS — R059 Cough, unspecified: Secondary | ICD-10-CM | POA: Diagnosis present

## 2023-06-06 DIAGNOSIS — E039 Hypothyroidism, unspecified: Secondary | ICD-10-CM | POA: Insufficient documentation

## 2023-06-06 DIAGNOSIS — I480 Paroxysmal atrial fibrillation: Secondary | ICD-10-CM | POA: Diagnosis not present

## 2023-06-06 DIAGNOSIS — Z7901 Long term (current) use of anticoagulants: Secondary | ICD-10-CM | POA: Diagnosis not present

## 2023-06-06 DIAGNOSIS — Z91040 Latex allergy status: Secondary | ICD-10-CM | POA: Diagnosis not present

## 2023-06-06 DIAGNOSIS — Z87891 Personal history of nicotine dependence: Secondary | ICD-10-CM | POA: Insufficient documentation

## 2023-06-06 DIAGNOSIS — J449 Chronic obstructive pulmonary disease, unspecified: Secondary | ICD-10-CM | POA: Insufficient documentation

## 2023-06-06 DIAGNOSIS — R051 Acute cough: Secondary | ICD-10-CM | POA: Insufficient documentation

## 2023-06-06 DIAGNOSIS — Z79899 Other long term (current) drug therapy: Secondary | ICD-10-CM | POA: Insufficient documentation

## 2023-06-06 DIAGNOSIS — Z1152 Encounter for screening for COVID-19: Secondary | ICD-10-CM | POA: Insufficient documentation

## 2023-06-06 DIAGNOSIS — F1090 Alcohol use, unspecified, uncomplicated: Secondary | ICD-10-CM | POA: Diagnosis not present

## 2023-06-06 DIAGNOSIS — I10 Essential (primary) hypertension: Secondary | ICD-10-CM | POA: Insufficient documentation

## 2023-06-06 DIAGNOSIS — N9985 Post endometrial ablation syndrome: Secondary | ICD-10-CM | POA: Diagnosis not present

## 2023-06-06 DIAGNOSIS — R7989 Other specified abnormal findings of blood chemistry: Secondary | ICD-10-CM | POA: Diagnosis not present

## 2023-06-06 NOTE — ED Triage Notes (Signed)
 Patient ambulatory to triage with steady gait, without difficulty or distress noted; pt reports recent ablation; having nonprod cough and "aching" to upper chest; denies any SHOB; denies fever but st does feel congested

## 2023-06-07 ENCOUNTER — Observation Stay (HOSPITAL_BASED_OUTPATIENT_CLINIC_OR_DEPARTMENT_OTHER)
Admit: 2023-06-07 | Discharge: 2023-06-07 | Disposition: A | Discharge status: Home / Self Care | Attending: Physician Assistant | Admitting: Physician Assistant

## 2023-06-07 ENCOUNTER — Observation Stay
Admission: EM | Admit: 2023-06-07 | Discharge: 2023-06-08 | Disposition: A | Discharge status: Home / Self Care | Attending: Student | Admitting: Student

## 2023-06-07 ENCOUNTER — Other Ambulatory Visit: Payer: Self-pay

## 2023-06-07 ENCOUNTER — Encounter: Payer: Self-pay | Admitting: Emergency Medicine

## 2023-06-07 ENCOUNTER — Emergency Department

## 2023-06-07 DIAGNOSIS — R7989 Other specified abnormal findings of blood chemistry: Secondary | ICD-10-CM

## 2023-06-07 DIAGNOSIS — I2510 Atherosclerotic heart disease of native coronary artery without angina pectoris: Secondary | ICD-10-CM | POA: Diagnosis not present

## 2023-06-07 DIAGNOSIS — R051 Acute cough: Principal | ICD-10-CM

## 2023-06-07 DIAGNOSIS — I480 Paroxysmal atrial fibrillation: Secondary | ICD-10-CM

## 2023-06-07 DIAGNOSIS — I25118 Atherosclerotic heart disease of native coronary artery with other forms of angina pectoris: Secondary | ICD-10-CM

## 2023-06-07 LAB — HEMOGLOBIN A1C
Hgb A1c MFr Bld: 5.4 % (ref 4.8–5.6)
Mean Plasma Glucose: 108.28 mg/dL

## 2023-06-07 LAB — COMPREHENSIVE METABOLIC PANEL
ALT: 13 U/L (ref 0–44)
AST: 18 U/L (ref 15–41)
Albumin: 4.4 g/dL (ref 3.5–5.0)
Alkaline Phosphatase: 56 U/L (ref 38–126)
Anion gap: 11 (ref 5–15)
BUN: 12 mg/dL (ref 8–23)
CO2: 24 mmol/L (ref 22–32)
Calcium: 9.1 mg/dL (ref 8.9–10.3)
Chloride: 102 mmol/L (ref 98–111)
Creatinine, Ser: 0.89 mg/dL (ref 0.44–1.00)
GFR, Estimated: >60 mL/min (ref >60)
Glucose, Bld: 114 mg/dL — ABNORMAL HIGH (ref 70–99)
Potassium: 2.8 mmol/L — ABNORMAL LOW (ref 3.5–5.1)
Sodium: 137 mmol/L (ref 135–145)
Total Bilirubin: 0.3 mg/dL (ref 0.0–1.2)
Total Protein: 7.4 g/dL (ref 6.5–8.1)

## 2023-06-07 LAB — CBC WITH DIFFERENTIAL/PLATELET
Abs Immature Granulocytes: 0.04 10*3/uL (ref 0.00–0.07)
Basophils Absolute: 0 10*3/uL (ref 0.0–0.1)
Basophils Relative: 0 %
Eosinophils Absolute: 0.1 10*3/uL (ref 0.0–0.5)
Eosinophils Relative: 1 %
HCT: 36.3 % (ref 36.0–46.0)
Hemoglobin: 11.4 g/dL — ABNORMAL LOW (ref 12.0–15.0)
Immature Granulocytes: 0 %
Lymphocytes Relative: 16 %
Lymphs Abs: 1.6 10*3/uL (ref 0.7–4.0)
MCH: 24.2 pg — ABNORMAL LOW (ref 26.0–34.0)
MCHC: 31.4 g/dL (ref 30.0–36.0)
MCV: 76.9 fL — ABNORMAL LOW (ref 80.0–100.0)
Monocytes Absolute: 0.7 10*3/uL (ref 0.1–1.0)
Monocytes Relative: 7 %
Neutro Abs: 7.4 10*3/uL (ref 1.7–7.7)
Neutrophils Relative %: 76 %
Platelets: 225 10*3/uL (ref 150–400)
RBC: 4.72 MIL/uL (ref 3.87–5.11)
RDW: 15.1 % (ref 11.5–15.5)
WBC: 9.9 10*3/uL (ref 4.0–10.5)
nRBC: 0 % (ref 0.0–0.2)

## 2023-06-07 LAB — HIV ANTIBODY (ROUTINE TESTING W REFLEX): HIV Screen 4th Generation wRfx: NONREACTIVE

## 2023-06-07 LAB — MAGNESIUM: Magnesium: 2.2 mg/dL (ref 1.7–2.4)

## 2023-06-07 LAB — RESPIRATORY PANEL BY PCR

## 2023-06-07 LAB — LIPID PANEL
Cholesterol: 114 mg/dL (ref 0–200)
HDL: 35 mg/dL — ABNORMAL LOW (ref >40)
LDL Cholesterol: 53 mg/dL (ref 0–99)
Total CHOL/HDL Ratio: 3.3 ratio
Triglycerides: 131 mg/dL (ref <150)
VLDL: 26 mg/dL (ref 0–40)

## 2023-06-07 LAB — RESP PANEL BY RT-PCR (RSV, FLU A&B, COVID)  RVPGX2
Influenza A by PCR: NEGATIVE
Influenza B by PCR: NEGATIVE
Resp Syncytial Virus by PCR: NEGATIVE
SARS Coronavirus 2 by RT PCR: NEGATIVE

## 2023-06-07 LAB — TROPONIN I (HIGH SENSITIVITY)
Troponin I (High Sensitivity): 66 ng/L — ABNORMAL HIGH (ref <18)
Troponin I (High Sensitivity): 69 ng/L — ABNORMAL HIGH (ref <18)
Troponin I (High Sensitivity): 82 ng/L — ABNORMAL HIGH (ref <18)

## 2023-06-07 LAB — PHOSPHORUS: Phosphorus: 3.3 mg/dL (ref 2.5–4.6)

## 2023-06-07 LAB — BRAIN NATRIURETIC PEPTIDE: B Natriuretic Peptide: 146.3 pg/mL — ABNORMAL HIGH (ref 0.0–100.0)

## 2023-06-07 MED ORDER — ONDANSETRON HCL 4 MG PO TABS: 4.0000 mg | ORAL_TABLET | Freq: Four times a day (QID) | ORAL | Status: DC | PRN

## 2023-06-07 MED ORDER — ACETAMINOPHEN 325 MG PO TABS: 650.0000 mg | ORAL_TABLET | Freq: Three times a day (TID) | ORAL | Status: DC | PRN

## 2023-06-07 MED ORDER — BENZONATATE 100 MG PO CAPS: 200.0000 mg | ORAL_CAPSULE | Freq: Three times a day (TID) | ORAL | Status: DC | PRN

## 2023-06-07 MED ORDER — ENOXAPARIN SODIUM 80 MG/0.8ML IJ SOSY
80.0000 mg | PREFILLED_SYRINGE | Freq: Two times a day (BID) | INTRAMUSCULAR | Status: DC
  Administered 2023-06-07 (×2): 80 mg via SUBCUTANEOUS
  Filled 2023-06-07 (×4): qty 0.8

## 2023-06-07 MED ORDER — ENOXAPARIN SODIUM 80 MG/0.8ML IJ SOSY
1.0000 mg/kg | PREFILLED_SYRINGE | Freq: Two times a day (BID) | INTRAMUSCULAR | Status: DC
  Filled 2023-06-07: qty 0.78

## 2023-06-07 MED ORDER — ALBUTEROL SULFATE (2.5 MG/3ML) 0.083% IN NEBU: 3.0000 mL | INHALATION_SOLUTION | Freq: Four times a day (QID) | RESPIRATORY_TRACT | Status: DC | PRN

## 2023-06-07 MED ORDER — CLINDAMYCIN HCL 150 MG PO CAPS: 300.0000 mg | ORAL_CAPSULE | Freq: Three times a day (TID) | ORAL | Status: DC

## 2023-06-07 MED ORDER — PERFLUTREN LIPID MICROSPHERE
1.0000 mL | INTRAVENOUS | Status: AC | PRN
Start: 2023-06-07 — End: 2023-06-07
  Administered 2023-06-07: 1 mL via INTRAVENOUS

## 2023-06-07 MED ORDER — AMLODIPINE BESYLATE 5 MG PO TABS
10.0000 mg | ORAL_TABLET | Freq: Every day | ORAL | Status: DC
  Administered 2023-06-07 – 2023-06-08 (×2): 10 mg via ORAL
  Filled 2023-06-07 (×2): qty 2

## 2023-06-07 MED ORDER — GUAIFENESIN ER 600 MG PO TB12
1200.0000 mg | ORAL_TABLET | Freq: Two times a day (BID) | ORAL | Status: DC
  Administered 2023-06-07 – 2023-06-08 (×3): 1200 mg via ORAL
  Filled 2023-06-07 (×3): qty 2

## 2023-06-07 MED ORDER — PANTOPRAZOLE SODIUM 40 MG PO TBEC
40.0000 mg | DELAYED_RELEASE_TABLET | Freq: Every day | ORAL | Status: DC
  Administered 2023-06-07 – 2023-06-08 (×2): 40 mg via ORAL
  Filled 2023-06-07 (×2): qty 1

## 2023-06-07 MED ORDER — CLINDAMYCIN HCL 150 MG PO CAPS
300.0000 mg | ORAL_CAPSULE | Freq: Three times a day (TID) | ORAL | Status: DC
  Administered 2023-06-08 (×2): 300 mg via ORAL
  Filled 2023-06-07 (×2): qty 2

## 2023-06-07 MED ORDER — LOSARTAN POTASSIUM 50 MG PO TABS
100.0000 mg | ORAL_TABLET | Freq: Every day | ORAL | Status: DC
  Administered 2023-06-07: 100 mg via ORAL
  Filled 2023-06-07: qty 2

## 2023-06-07 MED ORDER — ONDANSETRON HCL 4 MG/2ML IJ SOLN: 4.0000 mg | Freq: Four times a day (QID) | INTRAMUSCULAR | Status: DC | PRN

## 2023-06-07 MED ORDER — POTASSIUM CHLORIDE CRYS ER 20 MEQ PO TBCR
40.0000 meq | EXTENDED_RELEASE_TABLET | ORAL | Status: AC
  Administered 2023-06-07 (×2): 40 meq via ORAL
  Filled 2023-06-07 (×2): qty 2

## 2023-06-07 MED ORDER — HYDROCHLOROTHIAZIDE 12.5 MG PO TABS
12.5000 mg | ORAL_TABLET | Freq: Every day | ORAL | Status: DC
  Administered 2023-06-07: 12.5 mg via ORAL
  Filled 2023-06-07: qty 1

## 2023-06-07 MED ORDER — LEVOTHYROXINE SODIUM 100 MCG PO TABS
100.0000 ug | ORAL_TABLET | Freq: Every day | ORAL | Status: DC
  Administered 2023-06-08: 100 ug via ORAL
  Filled 2023-06-07: qty 1

## 2023-06-07 NOTE — H&P (Addendum)
 History and Physical    [name] GLO:[id number] DOB: [birth year] DOA: 06/07/2023  PCP: [facility] [employer] (Confirm with patient/family/NH records and if not entered, this has to be entered at [facility] point of entry) Patient coming from: Home  I have personally briefly reviewed patient's old medical records in [facility]  Chief Complaint: Dry cough  HPI: [name] is a 73 y.o. female with medical history significant of chronic A-flutter status post recent ablation, on chronic anticoagulation, HTN CAD, cigarette smoking, presented with persistent cough.  Patient recently underwent ablation for chronic A-flutter on March 11, since then patient has developed a combination of symptoms including postnasal drip, sore throat and dry cough, denied any sputum production no chest pain no fever or chills.  Dry cough appears to be more severe at night, no night sweat no weight loss.  Yesterday, patient checked her blood pressure and found blood pressure more than 200 and decided to come to ED.  ED Course: Afebrile, borderline bradycardia blood pressure 170/78, also saturation of 100% on room air.  Chest x-ray negative for acute findings, blood work showed sodium 136 chloride 103, K3.9, bicarb 25, BUN 24 creatinine 0.7, WBC 14.3, hemoglobin 14.5.  Troponin elevated 69> 66.  EKG showed no acute ST changes.  Review of Systems: As per HPI otherwise 14 point review of systems negative.    Past Medical History:  Diagnosis Date   Anemia    Arthritis    Atrial flutter (HCC) 09/2015   a.) CHADS2VASc 2 (sex, HTN); b.) s/p DCCV 11/11/2015 (150 J); c.) s/p RFCA 01/17/2016; d.) rate/rhythm maintained without pharmacological intervention; no chronic anticoagulation.   Coronary artery calcification seen on CT scan    Diastolic dysfunction 08/23/2019   a.)  TTE 08/23/2019: EF 55-60%, severe LAE, mild-mod MR, PASP 46.2, G2DD   Dyspnea    Essential hypertension    GERD (gastroesophageal  reflux disease)    Hepatic steatosis    Incomplete left bundle branch block (LBBB)    Pneumonia    as a child   Subclinical hypothyroidism    Tobacco abuse     Past Surgical History:  Procedure Laterality Date   ABDOMINAL HYSTERECTOMY     ABLATION OF DYSRHYTHMIC FOCUS  01/16/2016   ATRIAL FIBRILLATION ABLATION N/A 05/24/2023   Procedure: ATRIAL FIBRILLATION ABLATION;  Surgeon: Lanier Prude, MD;  Location: [facility] INVASIVE CV LAB;  Service: Cardiovascular;  Laterality: N/A;   ELECTROPHYSIOLOGIC STUDY N/A 11/11/2015   Procedure: CARDIOVERSION;  Surgeon: Antonieta Iba, MD;  Location: [facility] ORS;  Service: Cardiovascular;  Laterality: N/A;   ELECTROPHYSIOLOGIC STUDY N/A 01/16/2016   Procedure: A-Flutter Ablation;  Surgeon: Duke Salvia, MD;  Location[facility] INVASIVE CV LAB;  Service: Cardiovascular;  Laterality: N/A;   TOTAL HIP ARTHROPLASTY Right 12/10/2021   Procedure: TOTAL HIP ARTHROPLASTY ANTERIOR APPROACH;  Surgeon: Reinaldo Berber, MD;  Location: [facility] ORS;  Service: Orthopedics;  Laterality: Right;     reports that she quit smoking about 7 years ago. Her smoking use included cigarettes. She has never used smokeless tobacco. She reports current alcohol use. She reports that she does not use drugs.  Allergies  Allergen Reactions   Lisinopril Swelling    Swelling of lips and face.    Tizanidine Other (See Comments)    bradycardia   Amoxicillin Itching and Rash   Latex Itching and Rash    Family History  Problem Relation Age of Onset   Hypertension Mother    Diabetes  Mother    Diabetes Father    Diabetes Brother    Hypertension Brother    Hypertension Brother    Heart attack Brother    Diabetes Brother    Breast cancer Neg Hx      Prior to Admission medications   Medication Sig Start Date End Date Taking? Authorizing Provider  acetaminophen (TYLENOL) 650 MG CR tablet Take 650-1,300 mg by mouth every 8 (eight) hours as needed for pain.    [provider]   albuterol (VENTOLIN HFA) 108 (90 Base) MCG/ACT inhaler Inhale 2-4 puffs by mouth every 4 hours as needed for wheezing, cough, and/or shortness of breath 02/24/19   Loleta Rose, MD  amLODipine (NORVASC) 10 MG tablet Take 1 tablet (10 mg total) by mouth daily. 03/24/18 09/04/28  Cuthriell, Delorise Royals, PA-C  apixaban (ELIQUIS) 5 MG TABS tablet Take 1 tablet (5 mg total) by mouth 2 (two) times daily. 04/01/23 05/24/23  Phineas Semen, MD  ascorbic acid (VITAMIN C) 500 MG tablet Take 500 mg by mouth daily.    [provider]  cholecalciferol (VITAMIN D) 1000 units tablet Take 1,000 Units by mouth daily.    [provider]  colchicine 0.6 MG tablet Take 1 tablet (0.6 mg total) by mouth 2 (two) times daily for 5 days. 05/24/23 05/29/23  Sherie Don, NP  levothyroxine (SYNTHROID) 100 MCG tablet Take 100 mcg by mouth daily before breakfast.    [provider]  lidocaine (XYLOCAINE) 5 % ointment Apply 1 Application topically as needed. 05/17/23   Candelaria Stagers, DPM  losartan (COZAAR) 100 MG tablet Take 1 tablet (100 mg total) by mouth daily. 09/23/22   Furth, Cadence H, PA-C  pantoprazole (PROTONIX) 40 MG tablet Take 1 tablet (40 mg total) by mouth daily. 05/24/23 07/08/23  Sherie Don, NP    Physical Exam: Vitals:   06/07/23 0334 06/07/23 0530 06/07/23 0615 06/07/23 0700  BP: (!) 173/93 130/85  (!) 166/73  Pulse: 60  (!) 50 (!) 56  Resp: 20 18 16 19   Temp: 97.8 F (36.6 C)     TempSrc: Oral     SpO2: 100%  99% 97%  Weight:      Height:        Constitutional: NAD, calm, comfortable Vitals:   06/07/23 0334 06/07/23 0530 06/07/23 0615 06/07/23 0700  BP: (!) 173/93 130/85  (!) 166/73  Pulse: 60  (!) 50 (!) 56  Resp: 20 18 16 19   Temp: 97.8 F (36.6 C)     TempSrc: Oral     SpO2: 100%  99% 97%  Weight:      Height:       Eyes: PERRL, lids and conjunctivae normal ENMT: Mucous membranes are moist. Posterior pharynx clear of any exudate or lesions.Normal dentition.   Rash on the post laryngeal area with no significant exudate Neck: normal, supple, no masses, no thyromegaly Respiratory: clear to auscultation bilaterally, no wheezing, no crackles. Normal respiratory effort. No accessory muscle use.  Cardiovascular: Regular rate and rhythm, no murmurs / rubs / gallops. No extremity edema. 2+ pedal pulses. No carotid bruits.  Abdomen: no tenderness, no masses palpated. No hepatosplenomegaly. Bowel sounds positive.  Musculoskeletal: no clubbing / cyanosis. No joint deformity upper and lower extremities. Good ROM, no contractures. Normal muscle tone.  Skin: no rashes, lesions, ulcers. No induration Neurologic: CN 2-12 grossly intact. Sensation intact, DTR normal. Strength 5/5 in all 4.  Psychiatric: Normal judgment and insight. Alert and oriented x 3. Normal  mood.     Labs on Admission: I have personally reviewed following labs and imaging studies  CBC: Recent Labs  Lab 06/07/23 0013  WBC 9.9  NEUTROABS 7.4  HGB 11.4*  HCT 36.3  MCV 76.9*  PLT 225   Basic Metabolic Panel: Recent Labs  Lab 06/07/23 0013  NA 137  K 2.8*  CL 102  CO2 24  GLUCOSE 114*  BUN 12  CREATININE 0.89  CALCIUM 9.1   GFR: Estimated Creatinine Clearance: 60.9 mL/min (by C-G formula based on SCr of 0.89 mg/dL). Liver Function Tests: Recent Labs  Lab 06/07/23 0013  AST 18  ALT 13  ALKPHOS 56  BILITOT 0.3  PROT 7.4  ALBUMIN 4.4   No results for input(s): "LIPASE", "AMYLASE" in the last 168 hours. No results for input(s): "AMMONIA" in the last 168 hours. Coagulation Profile: No results for input(s): "INR", "PROTIME" in the last 168 hours. Cardiac Enzymes: No results for input(s): "CKTOTAL", "CKMB", "CKMBINDEX", "TROPONINI" in the last 168 hours. BNP (last 3 results) No results for input(s): "PROBNP" in the last 8760 hours. HbA1C: No results for input(s): "HGBA1C" in the last 72 hours. CBG: No results for input(s): "GLUCAP" in the last 168 hours. Lipid  Profile: No results for input(s): "CHOL", "HDL", "LDLCALC", "TRIG", "CHOLHDL", "LDLDIRECT" in the last 72 hours. Thyroid Function Tests: No results for input(s): "TSH", "T4TOTAL", "FREET4", "T3FREE", "THYROIDAB" in the last 72 hours. Anemia Panel: No results for input(s): "VITAMINB12", "FOLATE", "FERRITIN", "TIBC", "IRON", "RETICCTPCT" in the last 72 hours. Urine analysis:    Component Value Date/Time   COLORURINE STRAW (A) 09/16/2022 0041   APPEARANCEUR CLEAR (A) 09/16/2022 0041   LABSPEC 1.005 09/16/2022 0041   PHURINE 7.0 09/16/2022 0041   GLUCOSEU NEGATIVE 09/16/2022 0041   HGBUR NEGATIVE 09/16/2022 0041   BILIRUBINUR NEGATIVE 09/16/2022 0041   KETONESUR NEGATIVE 09/16/2022 0041   PROTEINUR NEGATIVE 09/16/2022 0041   NITRITE NEGATIVE 09/16/2022 0041   LEUKOCYTESUR SMALL (A) 09/16/2022 0041    Radiological Exams on Admission: DG Chest 2 View Result Date: 06/07/2023 CLINICAL DATA:  Chest pain.  Nonproductive cough.  Congestion. EXAM: CHEST - 2 VIEW COMPARISON:  04/15/2023 FINDINGS: Mild cardiac enlargement. No vascular congestion, edema, or consolidation. Mild linear scarring in the left mid lung is unchanged since prior study. Lungs are otherwise clear. No pleural effusion or pneumothorax. Mediastinal contours appear intact. Calcification of the aorta. Degenerative changes in the spine and shoulders. IMPRESSION: Cardiac enlargement.  No evidence of active pulmonary disease. Electronically Signed   By: Burman Nieves M.D.   On: 06/07/2023 00:53    EKG: Independently reviewed.  Sinus bradycardia, no acute ST changes.  Chronic QRS changes on V1 compared to previous EKG  Assessment/Plan Principal Problem:   Troponin level elevated  (please populate well all problems here in Problem List. (For example, if patient is on BP meds at home and you resume or decide to hold them, it is a problem that needs to be her. Same for CAD, COPD, HLD and so on)  Elevated troponins -No significant  chest pain or ST changes on EKG -Discussed with on-call cardiology, who reviewed patient most recent CT chest study before the ablation procedure, impression is patient has severe multivessel CAD with significant calcification.  Cardiology recommend echocardiogram and inpatient versus outpatient ischemia workup.  Cardiology will see patient this morning. -For now we will change Eliquis to Lovenox twice daily for possible cardiac intervention. -Echocardiogram  Dry cough -Appears to be related to postnasal drip, will  check respiratory pathogen's panel -Symptomatic management with Tessalon and guaifenesin  Hypokalemia -P.o. replacement -Recheck level tomorrow -Magnesium and phosphorus level pending  Paroxysm flutter status post ablation -In sinus rhythm -Change Eliquis to Lovenox twice daily as mentioned above  Hypothyroidism -No acute concerns, continue Synthroid   DVT prophylaxis: Lovenox twice daily Code Status: Full Family Communication: None at bedside Disposition Plan: Expect less than 2 midnight hospital stay Consults called: Cardiology Admission status: \Telemetry observation   Emeline General MD [facility] Pager [phone]  06/07/2023, 9:47 AM

## 2023-06-07 NOTE — Consult Note (Signed)
 Cardiology Consultation:   Patient ID: [name]; [id number]; July 09, 1950   Admit date: 06/07/2023 Date of Consult: 06/07/2023  Primary Care Provider: [facility] Primary Cardiologist: Kirke Corin Primary Electrophysiologist:  Lalla Brothers   Patient Profile:   [name] is a 73 y.o. female with a hx of CAD noted on CT imaging, atrial flutter ablation in 2017, A-fib status post recent catheter ablation on 05/20/2023, bradycardia, mild to moderate mitral regurgitation, HTN with prior angioedema on lisinopril, and LVH who is being seen today for the evaluation of elevated high-sensitivity troponin at the request of Dr. Chipper Herb.  History of Present Illness:   [name] underwent atrial flutter ablation by Dr. Graciela Husbands in 2017.  Echo at that time showed an EF of 50 to 55%, no regional wall motion abnormalities, moderate LVH, moderately calcified mitral annulus, mildly dilated left atrium, mildly dilated right ventricle, moderately to severely dilated right atrium, and a trivial pericardial effusion.  Echo in 2021 showed an EF of 55 to 60%, no regional wall motion abnormalities, grade 2 diastolic dysfunction, normal RV systolic function and ventricular cavity size, moderately elevated RVSP, severely dilated left atrium, and mild to moderate mitral regurgitation.  She was seen in the ED in 09/2022 with palpitations and found to be in A-fib with RVR by EMS, spontaneously converting to sinus rhythm prior to arrival to the ED.  She was seen again in the ED later in July with bradycardia with rates in the 40s bpm.  Follow-up Zio patch x 2 showed no evidence of further A-fib.  It was amended she undergo loop implantation to evaluate A-fib burden and to take aspirin.  Echo in 10/2022 showed an EF of 55 to 60%, no regional wall motion abnormalities, grade 2 diastolic dysfunction, elevated left atrial pressure, normal RV systolic function with mildly increased RV wall thickness and normal RVSP,  severely dilated left atrium, moderately dilated right atrium, small pericardial effusion localized near the right atrium and anterior to the RV, mild to moderate mitral regurgitation, and an estimated right atrial pressure of 8 mmHg.  She was seen in the ED twice in 03/2023 with A-fib with RVR with recommendation to start apixaban.  Most recently, she was evaluated by EP in 04/2023 with recommendation to proceed with ablation.  As part of her precatheter ablation evaluation she underwent coronary morphology on 05/20/2023 that demonstrated a calcium score of 672 which was the 94th percentile along with normal pulmonary vein drainage into the left atrium.  The esophagus rate and in the left atrial midline and was not in the proximity to any of the pulmonary veins.  She underwent a repeat catheter ablation for A-fib on 05/24/2023 without significant early complications.  Postprocedure, she developed a dry cough without associated URI symptoms, fevers, or chills.  She has been without symptoms of chest pain, dyspnea, orthopnea, lower extremity swelling, abdominal distention, or early satiety.  She was seen at her PCP's office with probable URI.  On 3/24, the cough became more persistent prompting her to come into the ED for evaluation.  Upon arrival to the ED she was found to be hemodynamically stable and hypertensive with blood pressures largely in the 160s to 170s systolic.  Bradycardic with heart rates in the 50s bpm.  Oxygen saturation 100% on room air.  Afebrile.  Labs notable for initial high-sensitivity troponin 69 with a delta troponin of 66, trended to 82.  BNP 146.  COVID, influenza, and RSV negative.  Respiratory panel pending.  Potassium 2.8.  Magnesium 2.2.  Hgb 11.4 with baseline around 13.  Chest x-ray showed cardiac enlargement with no evidence of active pulmonary disease.  At time of cardiology consult she is without symptoms of chest pain, shortness of breath, orthopnea, or lower extremity swelling.   Dry cough persists.    Past Medical History:  Diagnosis Date   Anemia    Arthritis    Atrial flutter (HCC) 09/2015   a.) CHADS2VASc 2 (sex, HTN); b.) s/p DCCV 11/11/2015 (150 J); c.) s/p RFCA 01/17/2016; d.) rate/rhythm maintained without pharmacological intervention; no chronic anticoagulation.   Coronary artery calcification seen on CT scan    Diastolic dysfunction 08/23/2019   a.)  TTE 08/23/2019: EF 55-60%, severe LAE, mild-mod MR, PASP 46.2, G2DD   Dyspnea    Essential hypertension    GERD (gastroesophageal reflux disease)    Hepatic steatosis    Incomplete left bundle branch block (LBBB)    Pneumonia    as a child   Subclinical hypothyroidism    Tobacco abuse     Past Surgical History:  Procedure Laterality Date   ABDOMINAL HYSTERECTOMY     ABLATION OF DYSRHYTHMIC FOCUS  01/16/2016   ATRIAL FIBRILLATION ABLATION N/A 05/24/2023   Procedure: ATRIAL FIBRILLATION ABLATION;  Surgeon: Lanier Prude, MD;  Location: [facility] INVASIVE CV LAB;  Service: Cardiovascular;  Laterality: N/A;   ELECTROPHYSIOLOGIC STUDY N/A 11/11/2015   Procedure: CARDIOVERSION;  Surgeon: Antonieta Iba, MD;  Location: [facility] ORS;  Service: Cardiovascular;  Laterality: N/A;   ELECTROPHYSIOLOGIC STUDY N/A 01/16/2016   Procedure: A-Flutter Ablation;  Surgeon: Duke Salvia, MD;  Location: [facility] INVASIVE CV LAB;  Service: Cardiovascular;  Laterality: N/A;   TOTAL HIP ARTHROPLASTY Right 12/10/2021   Procedure: TOTAL HIP ARTHROPLASTY ANTERIOR APPROACH;  Surgeon: Reinaldo Berber, MD;  Location: [facility] ORS;  Service: Orthopedics;  Laterality: Right;     Home Meds: Prior to Admission medications   Medication Sig Start Date End Date Taking? Authorizing Provider  acetaminophen (TYLENOL) 650 MG CR tablet Take 650-1,300 mg by mouth every 8 (eight) hours as needed for pain.    [provider]  albuterol (VENTOLIN HFA) 108 (90 Base) MCG/ACT inhaler Inhale 2-4 puffs by mouth every 4 hours as needed for wheezing,  cough, and/or shortness of breath 02/24/19   Loleta Rose, MD  amLODipine (NORVASC) 10 MG tablet Take 1 tablet (10 mg total) by mouth daily. 03/24/18 09/04/28  Cuthriell, Delorise Royals, PA-C  apixaban (ELIQUIS) 5 MG TABS tablet Take 1 tablet (5 mg total) by mouth 2 (two) times daily. 04/01/23 05/24/23  Phineas Semen, MD  ascorbic acid (VITAMIN C) 500 MG tablet Take 500 mg by mouth daily.    [provider]  cholecalciferol (VITAMIN D) 1000 units tablet Take 1,000 Units by mouth daily.    [provider]  colchicine 0.6 MG tablet Take 1 tablet (0.6 mg total) by mouth 2 (two) times daily for 5 days. 05/24/23 05/29/23  Sherie Don, NP  levothyroxine (SYNTHROID) 100 MCG tablet Take 100 mcg by mouth daily before breakfast.    [provider]  lidocaine (XYLOCAINE) 5 % ointment Apply 1 Application topically as needed. 05/17/23   Candelaria Stagers, DPM  losartan (COZAAR) 100 MG tablet Take 1 tablet (100 mg total) by mouth daily. 09/23/22   Furth, Cadence H, PA-C  pantoprazole (PROTONIX) 40 MG tablet Take 1 tablet (40 mg total) by mouth daily. 05/24/23 07/08/23  Sherie Don, NP    Inpatient Medications: Scheduled Meds:  amLODipine  10 mg Oral Daily   enoxaparin (LOVENOX) injection  1 mg/kg Subcutaneous Q12H   guaiFENesin  1,200 mg Oral BID   hydrochlorothiazide  12.5 mg Oral Daily   [START ON 06/08/2023] levothyroxine  100 mcg Oral QAC breakfast   losartan  100 mg Oral Daily   pantoprazole  40 mg Oral Daily   potassium chloride  40 mEq Oral Q2H   Continuous Infusions:  PRN Meds: acetaminophen, albuterol, benzonatate, ondansetron **OR** ondansetron (ZOFRAN) IV  Allergies:   Allergies  Allergen Reactions   Lisinopril Swelling    Swelling of lips and face.    Tizanidine Other (See Comments)    bradycardia   Amoxicillin Itching and Rash   Latex Itching and Rash    Social History:   Social History   Socioeconomic History   Marital status: Widowed    Spouse name: Not  on file   Number of children: Not on file   Years of education: Not on file   Highest education level: Not on file  Occupational History   Not on file  Tobacco Use   Smoking status: Former    Current packs/day: 0.00    Types: Cigarettes    Quit date: 11/19/2015    Years since quitting: 7.5   Smokeless tobacco: Never  Vaping Use   Vaping status: Never Used  Substance and Sexual Activity   Alcohol use: Yes    Comment: occasionally   Drug use: No   Sexual activity: Yes    Birth control/protection: Surgical  Other Topics Concern   Not on file  Social History Narrative   Not on file   Social Drivers of Health   Financial Resource Strain: Not on file  Food Insecurity: Not on file  Transportation Needs: No Transportation Needs (12/10/2021)   PRAPARE - Transportation    Lack of Transportation (Medical): No    Lack of Transportation (Non-Medical): No  Physical Activity: Not on file  Stress: Not on file  Social Connections: Not on file  Intimate Partner Violence: Not At Risk (12/10/2021)   Humiliation, Afraid, Rape, and Kick questionnaire    Fear of Current or Ex-Partner: No    Emotionally Abused: No    Physically Abused: No    Sexually Abused: No     Family History:   Family History  Problem Relation Age of Onset   Hypertension Mother    Diabetes Mother    Diabetes Father    Diabetes Brother    Hypertension Brother    Hypertension Brother    Heart attack Brother    Diabetes Brother    Breast cancer Neg Hx     ROS:  Review of Systems  Constitutional:  Negative for chills, diaphoresis, fever, malaise/fatigue and weight loss.  HENT:  Negative for congestion, ear pain, sinus pain and sore throat.   Eyes:  Negative for discharge and redness.  Respiratory:  Positive for cough. Negative for hemoptysis, sputum production, shortness of breath, wheezing and stridor.   Cardiovascular:  Negative for chest pain, palpitations, orthopnea, claudication, leg swelling and PND.   Gastrointestinal:  Negative for abdominal pain, heartburn, nausea and vomiting.  Musculoskeletal:  Negative for falls and myalgias.  Skin:  Negative for rash.  Neurological:  Negative for dizziness, tingling, tremors, sensory change, speech change, focal weakness, loss of consciousness and weakness.  Endo/Heme/Allergies:  Does not bruise/bleed easily.  Psychiatric/Behavioral:  Negative for substance abuse. The patient is not nervous/anxious.   All other systems reviewed and are negative.  Physical Exam/Data:   Vitals:   06/07/23 0334 06/07/23 0530 06/07/23 0615 06/07/23 0700  BP: (!) 173/93 130/85  (!) 166/73  Pulse: 60  (!) 50 (!) 56  Resp: 20 18 16 19   Temp: 97.8 F (36.6 C)     TempSrc: Oral     SpO2: 100%  99% 97%  Weight:      Height:       No intake or output data in the 24 hours ending 06/07/23 1002 Filed Weights   06/07/23 0002  Weight: 78.5 kg   Body mass index is 28.79 kg/m.   Physical Exam: General: Well developed, well nourished, in no acute distress. Head: Normocephalic, atraumatic, sclera non-icteric, no xanthomas, nares without discharge.  Neck: Negative for carotid bruits. JVD not elevated. Lungs: Clear bilaterally to auscultation without wheezes, rales, or rhonchi. Breathing is unlabored. Heart: RRR with S1 S2. II/VI systolic murmur, no rubs, or gallops appreciated. Abdomen: Soft, non-tender, non-distended with normoactive bowel sounds. No hepatomegaly. No rebound/guarding. No obvious abdominal masses. Msk:  Strength and tone appear normal for age. Extremities: No clubbing or cyanosis. No edema. Distal pedal pulses are 2+ and equal bilaterally. Neuro: Alert and oriented X 3. No facial asymmetry. No focal deficit. Moves all extremities spontaneously. Psych:  Responds to questions appropriately with a normal affect.   EKG:  The EKG was personally reviewed and demonstrates: sinus bradycardia, 54 bpm, LVH with early repolarization abnormality  Telemetry:   Telemetry was personally reviewed and demonstrates: No tele available for review at time of consult  Weights: [facility] Weights   06/07/23 0002  Weight: 78.5 kg    Relevant CV Studies:  Coronary morphology 05/16/2023: 1. There is normal pulmonary vein drainage into the left atrium. (2 on the right and 2 on the left via a common trunk on left) with ostial measurements as above.   2. The left atrial appendage is a Windsock-cactus type with two lobes and ostial size 38 x 27 mm and length 46 mm, Area 74 mm2. There is no thrombus in the left atrial appendage.   3. The esophagus runs in the left atrial midline and is not in the proximity to any of the pulmonary veins.   4. Coronary calcium score 672. This is 94th percentile for age/gender. __________  2D echo 10/20/2022: 1. Left ventricular ejection fraction, by estimation, is 55 to 60%. The  left ventricle has normal function. The left ventricle has no regional  wall motion abnormalities. Left ventricular diastolic parameters are  consistent with Grade II diastolic  dysfunction (pseudonormalization). Elevated left atrial pressure. The  average left ventricular global longitudinal strain is -12.7 %. The global  longitudinal strain is abnormal.   2. Right ventricular systolic function is normal. The right ventricular  size is normal. Mildly increased right ventricular wall thickness. There  is normal pulmonary artery systolic pressure.   3. Left atrial size was severely dilated.   4. Right atrial size was moderately dilated.   5. A small pericardial effusion is present. The pericardial effusion is  localized near the right atrium and anterior to the right ventricle.   6. The mitral valve is grossly normal. Mild to moderate mitral valve  regurgitation. No evidence of mitral stenosis.   7. The aortic valve was not well visualized. Aortic valve regurgitation  is not visualized. No aortic stenosis is present.   8. The inferior vena cava is  normal in size with <50% respiratory  variability, suggesting right atrial pressure of 8 mmHg.  __________  Luci Bank patch 09/2022: Monitor 1 Patient had a min HR of 38 bpm, max HR of 174 bpm, and avg HR of 57 bpm. Predominant underlying rhythm was Sinus Rhythm. 10 Supraventricular Tachycardia runs occurred, the run with the fastest interval lasting 15 beats with a max rate of 174 bpm (avg 138  bpm); the run with the fastest interval was also the longest. Idioventricular Rhythm was present.  Rare PACs and rare PVCs.   Monitor 2 Patient had a min HR of 35 bpm, max HR of 203 bpm, and avg HR of 58 bpm. Predominant underlying rhythm was Sinus Rhythm. 1 run of Ventricular Tachycardia occurred lasting 5 beats with a max rate of 133 bpm (avg 128 bpm). 20 Supraventricular Tachycardia  runs occurred, the run with the fastest interval lasting 5 beats with a max rate of 203 bpm, the longest lasting 15 beats with an avg rate of 100 bpm.  Rare PACs and rare PVCs. __________  2D echo 08/23/2019: 1. Left ventricular ejection fraction, by estimation, is 55 to 60%. The  left ventricle has normal function. The left ventricle has no regional  wall motion abnormalities. Left ventricular diastolic parameters are  consistent with Grade II diastolic  dysfunction (pseudonormalization).   2. Right ventricular systolic function is normal. The right ventricular  size is normal. There is moderately elevated pulmonary artery systolic  pressure.   3. Left atrial size was severely dilated.   4. The mitral valve is normal in structure. Mild to moderate mitral valve  regurgitation.  ___________  24-hour Holter 11/2015: Overall rhythm was sinus. Heart rate ranged from 45-127 BPM, average of 62 BPM. The maximum heart rate was actually a run of atrial tachycardia, as described below. Longest RR interval was normal at 1.76 seconds.   Supraventricular ectopy: 193 isolated PACs. 11 atrial couplets. 3 runs totaling 26 beats - 17  beats run of likely atrial tachycardia at 128 BPM as the longest. The fastest was a 5 beat run of likely atrial tachycardia at 154 BPM.   Ventricular ectopy: 80 isolated PVCs. 2 ventricular couplets. No ventricular runs.   No documented atrial fibrillation. __________  Eugenie Birks MPI 09/25/2015: Rhythm remained in atrial flutter throughout the study. There was no ST segment deviation noted during stress. Myocardial perfusion imaging revealed normal perfusion on stress and rest. No evidence of ischemia. This is a low risk study. Nuclear stress EF: 47%. Please verify with echocardiogram. __________  2D echo 09/23/2015: - Left ventricle: The cavity size was normal. Wall thickness was    increased in a pattern of moderate LVH. Systolic function was    normal. The estimated ejection fraction was in the range of 50%    to 55%. Wall motion was normal; there were no regional wall    motion abnormalities.  - Mitral valve: Moderately calcified annulus.  - Left atrium: The atrium was mildly dilated.  - Right ventricle: The cavity size was mildly dilated.  - Right atrium: The atrium was moderately to severely dilated.  - Pericardium, extracardiac: A trivial pericardial effusion was    identified.    Laboratory Data:  Chemistry Recent Labs  Lab 06/07/23 0013  NA 137  K 2.8*  CL 102  CO2 24  GLUCOSE 114*  BUN 12  CREATININE 0.89  CALCIUM 9.1  GFRNONAA >60  ANIONGAP 11    Recent Labs  Lab 06/07/23 0013  PROT 7.4  ALBUMIN 4.4  AST 18  ALT 13  ALKPHOS 56  BILITOT 0.3  Hematology Recent Labs  Lab 06/07/23 0013  WBC 9.9  RBC 4.72  HGB 11.4*  HCT 36.3  MCV 76.9*  MCH 24.2*  MCHC 31.4  RDW 15.1  PLT 225   Cardiac EnzymesNo results for input(s): "TROPONINI" in the last 168 hours. No results for input(s): "TROPIPOC" in the last 168 hours.  BNP Recent Labs  Lab 06/07/23 0602  BNP 146.3*    DDimer No results for input(s): "DDIMER" in the last 168  hours.  Radiology/Studies:  DG Chest 2 View Result Date: 06/07/2023 IMPRESSION: Cardiac enlargement.  No evidence of active pulmonary disease. Electronically Signed   By: Burman Nieves M.D.   On: 06/07/2023 00:53    Assessment and Plan:   1.  CAD involving the native coronary arteries with elevated high-sensitivity troponin: -Mildly elevated and flat trending, not consistent with ACS -However, patient was noted to have significantly elevated coronary artery calcium score on CT morphology leading up to A-fib ablation -Obtain echo, if this is reassuring, would pursue outpatient ischemic testing once that she has improved from her cough -Defer addition of heparin drip for now -Apixaban in place of aspirin -Obtain lipid panel and A1c for further risk stratification -From a cardiac perspective, patient can eat at this time  2.  Dry cough: -Has been present since undergoing A-fib ablation -Obtain echo to evaluate for any significant structural abnormality including pulmonic stenosis given recent PVI -Cannot exclude viral URI -Would also consider transitioning ARB to alternative antihypertensive  3.  A-fib/flutter: -Status post atrial flutter ablation in 2017 with development of A-fib in 09/2022 with recurrence of atrial arrhythmia in 03/2023 now status post A-fib ablation on 05/24/2023 -Primary service has transition to PTA Eliquis to Lovenox while admitted, resume DOAC prior to discharge when it is clear inpatient invasive procedures will not be needed -Has completed colchicine course -PTA Protonix post ablation -Obtain echo as outlined above -CHA2DS2-VASc at least  4.  HTN with LVH: -Suboptimally controlled -Consider transitioning ARB to alternative antihypertensive if cough persists -Has not yet received losartan, HCTZ, or amlodipine -Titrate antihypertensive therapy as indicated -Unable to use beta-blocker with bradycardic rate  5.  Hypokalemia: -Replete to goal 4.0 -Magnesium  at goal  6.  Microcytic anemia: -No obvious bleeding -Trend  7.  Abnormal CT imaging: -Multiple hypervascular hepatic nodules noted coronary morphology CT with recommendation for MRI of the liver with contrast -Recommend further discussion and follow up of this in the outpatient setting  8. Mitral regurgitation: -Mild to moderate by echo in 10/2022 -Echo pending as outlined above -Outpatient follow-up    For questions or updates, please contact [facility] Please consult [email or web address] for contact info under Cardiology/STEMI.   Signed, Eula Listen, PA-C [facility] Pager: [phone] 06/07/2023, 10:02 AM

## 2023-06-07 NOTE — ED Notes (Addendum)
 Walked beside pt to the bathroom. Pt was also assisted back to bed with side rails raised and call button beside the patient.

## 2023-06-07 NOTE — ED Provider Notes (Signed)
 [facility] Provider Note    Event Date/Time   First MD Initiated Contact with Patient 06/07/23 [phone]     (approximate)   History   Cough   HPI  [name] is a 73 y.o. female with a history of paroxysmal atrial fibrillation on Eliquis, GERD, hypertension, and hypothyroidism who presents with cough for approximately the last 1 to 2 weeks, persistent course, acutely worsened last night.  The patient states that the cough is dry and nonproductive.  She denies any shortness of breath when she is not coughing.  She has no chest pain.  She denies feeling dizzy or lightheaded.  She denies any fevers.  She has had no running or diarrhea.  She states that the cough started after she had an ablation for atrial fibrillation earlier this month.  I reviewed the past medical records.  The patient's most recent outpatient encounter was on 2/26 with Dr. Lalla Brothers from cardiology for follow-up of her atrial fibrillation and implantation of a loop recorder.  She had an ablation in 2017.   Physical Exam   Triage Vital Signs: ED Triage Vitals  Encounter Vitals Group     BP 06/07/23 0005 (!) 174/78     Systolic BP Percentile --      Diastolic BP Percentile --      Pulse Rate 06/07/23 0005 (!) 58     Resp 06/07/23 0005 16     Temp 06/07/23 0005 97.7 F (36.5 C)     Temp Source 06/07/23 0005 Oral     SpO2 06/07/23 0005 100 %     Weight 06/07/23 0002 173 lb (78.5 kg)     Height 06/07/23 0002 5\' 5"  (1.651 m)     Head Circumference --      Peak Flow --      Pain Score 06/07/23 0002 7     Pain Loc --      Pain Education --      Exclude from Growth Chart --     Most recent vital signs: Vitals:   06/07/23 0615 06/07/23 0700  BP:  (!) 166/73  Pulse: (!) 50 (!) 56  Resp: 16 19  Temp:    SpO2: 99% 97%     General: Alert, comfortable appearing, no distress.  CV:  Good peripheral perfusion.  Resp:  Normal effort.  Lungs CTAB. Abd:  No distention.  Other:  No  peripheral edema.   ED Results / Procedures / Treatments   Labs (all labs ordered are listed, but only abnormal results are displayed) Labs Reviewed  CBC WITH DIFFERENTIAL/PLATELET - Abnormal; Notable for the following components:      Result Value   Hemoglobin 11.4 (*)    MCV 76.9 (*)    MCH 24.2 (*)    All other components within normal limits  COMPREHENSIVE METABOLIC PANEL - Abnormal; Notable for the following components:   Potassium 2.8 (*)    Glucose, Bld 114 (*)    All other components within normal limits  BRAIN NATRIURETIC PEPTIDE - Abnormal; Notable for the following components:   B Natriuretic Peptide 146.3 (*)    All other components within normal limits  TROPONIN I (HIGH SENSITIVITY) - Abnormal; Notable for the following components:   Troponin I (High Sensitivity) 69 (*)    All other components within normal limits  TROPONIN I (HIGH SENSITIVITY) - Abnormal; Notable for the following components:   Troponin I (High Sensitivity) 66 (*)    All other  components within normal limits  TROPONIN I (HIGH SENSITIVITY) - Abnormal; Notable for the following components:   Troponin I (High Sensitivity) 82 (*)    All other components within normal limits  RESP PANEL BY RT-PCR (RSV, FLU A&B, COVID)  [id number]     EKG  ED ECG REPORT I, Dionne Bucy, the attending physician, personally viewed and interpreted this ECG.  Date: 06/07/2023 EKG Time: 0003 Rate: 54 Rhythm: normal sinus rhythm QRS Axis: normal Intervals: normal ST/T Wave abnormalities: Nonspecific ST abnormalities Narrative Interpretation: no evidence of acute ischemia; there is no significant change when compared to EKG of 05/24/2023    RADIOLOGY  Chest x-ray: I independently viewed and interpreted the images; there is no focal consolidation or edema   PROCEDURES:  Critical Care performed: No  Procedures   MEDICATIONS ORDERED IN ED: Medications - No data to display   IMPRESSION / MDM /  ASSESSMENT AND PLAN / ED COURSE  I reviewed the triage vital signs and the nursing notes.  73 year old female with PMH as noted above presents with cough for the last few weeks, acutely worsened last night, with no associated shortness of breath, fever, or chest pain.  Physical exam is unremarkable for acute findings.  The patient is hypertensive but other vital signs are normal.  EKG shows nonspecific ST abnormalities which are unchanged from prior.  Chest x-ray shows no acute findings.  Differential diagnosis includes, but is not limited to, acute bronchitis, COVID-19, influenza, other viral syndrome, bacterial pneumonia, reactive airway disease, bronchospasm, less likely cardiac etiology.  CMP shows mild hypokalemia.  CBC shows no leukocytosis.  Respiratory panel is negative.  However, initial troponin and repeat are slightly elevated.  Given that the patient has no EKG changes and no chest pain, I have a low suspicion for ACS.  I have added on a BNP and a third troponin and we will reassess.  Patient's presentation is most consistent with acute presentation with potential threat to life or bodily function.  The patient is on the cardiac monitor to evaluate for evidence of arrhythmia and/or significant heart rate changes.   ----------------------------------------- 7:33 AM on 06/07/2023 -----------------------------------------  Repeat troponin is increased.  The patient still has no chest pain but will need further workup.  I have paged the hospitalist for admission.  FINAL CLINICAL IMPRESSION(S) / ED DIAGNOSES   Final diagnoses:  Acute cough  Elevated troponin     Rx / DC Orders   ED Discharge Orders     None        Note:  This document was prepared using Dragon voice recognition software and may include unintentional dictation errors.    Dionne Bucy, MD 06/07/23 [phone]

## 2023-06-07 NOTE — ED Notes (Signed)
 Echo at bedside

## 2023-06-07 NOTE — ED Notes (Signed)
 CCMD called.

## 2023-06-08 DIAGNOSIS — R7989 Other specified abnormal findings of blood chemistry: Secondary | ICD-10-CM | POA: Diagnosis not present

## 2023-06-08 DIAGNOSIS — R051 Acute cough: Secondary | ICD-10-CM | POA: Diagnosis not present

## 2023-06-08 DIAGNOSIS — I480 Paroxysmal atrial fibrillation: Secondary | ICD-10-CM

## 2023-06-08 DIAGNOSIS — I25118 Atherosclerotic heart disease of native coronary artery with other forms of angina pectoris: Secondary | ICD-10-CM | POA: Diagnosis not present

## 2023-06-08 DIAGNOSIS — I483 Typical atrial flutter: Secondary | ICD-10-CM

## 2023-06-08 DIAGNOSIS — I10 Essential (primary) hypertension: Secondary | ICD-10-CM

## 2023-06-08 LAB — CBC
HCT: 35 % — ABNORMAL LOW (ref 36.0–46.0)
Hemoglobin: 10.9 g/dL — ABNORMAL LOW (ref 12.0–15.0)
MCH: 23.9 pg — ABNORMAL LOW (ref 26.0–34.0)
MCHC: 31.1 g/dL (ref 30.0–36.0)
MCV: 76.8 fL — ABNORMAL LOW (ref 80.0–100.0)
Platelets: 216 10*3/uL (ref 150–400)
RBC: 4.56 MIL/uL (ref 3.87–5.11)
RDW: 15.2 % (ref 11.5–15.5)
WBC: 6.3 10*3/uL (ref 4.0–10.5)
nRBC: 0 % (ref 0.0–0.2)

## 2023-06-08 LAB — ECHOCARDIOGRAM COMPLETE
Area-P 1/2: 3.66 cm2
Height: 65 in
S' Lateral: 3.2 cm
Weight: 2768 [oz_av]

## 2023-06-08 LAB — BASIC METABOLIC PANEL
Anion gap: 6 (ref 5–15)
BUN: 10 mg/dL (ref 8–23)
CO2: 27 mmol/L (ref 22–32)
Calcium: 8.8 mg/dL — ABNORMAL LOW (ref 8.9–10.3)
Chloride: 108 mmol/L (ref 98–111)
Creatinine, Ser: 0.82 mg/dL (ref 0.44–1.00)
GFR, Estimated: >60 mL/min (ref >60)
Glucose, Bld: 93 mg/dL (ref 70–99)
Potassium: 3.4 mmol/L — ABNORMAL LOW (ref 3.5–5.1)
Sodium: 141 mmol/L (ref 135–145)

## 2023-06-08 MED ORDER — APIXABAN 5 MG PO TABS
5.0000 mg | ORAL_TABLET | Freq: Two times a day (BID) | ORAL | Status: DC
  Administered 2023-06-08: 5 mg via ORAL
  Filled 2023-06-08: qty 1

## 2023-06-08 MED ORDER — ALBUTEROL SULFATE HFA 108 (90 BASE) MCG/ACT IN AERS: 1.0000 | INHALATION_SPRAY | RESPIRATORY_TRACT | 1 refills | Status: AC | PRN

## 2023-06-08 MED ORDER — LOSARTAN POTASSIUM 50 MG PO TABS: 100.0000 mg | ORAL_TABLET | Freq: Every day | ORAL | Status: DC

## 2023-06-08 MED ORDER — POTASSIUM CHLORIDE CRYS ER 20 MEQ PO TBCR
40.0000 meq | EXTENDED_RELEASE_TABLET | Freq: Two times a day (BID) | ORAL | Status: DC
  Administered 2023-06-08: 40 meq via ORAL
  Filled 2023-06-08: qty 2

## 2023-06-08 MED ORDER — BENZONATATE 200 MG PO CAPS: 200.0000 mg | ORAL_CAPSULE | Freq: Three times a day (TID) | ORAL | 0 refills | Status: DC | PRN

## 2023-06-08 MED ORDER — POTASSIUM CHLORIDE CRYS ER 20 MEQ PO TBCR: 10.0000 meq | EXTENDED_RELEASE_TABLET | Freq: Every day | ORAL | Status: DC

## 2023-06-08 MED ORDER — FLUTICASONE FUROATE-VILANTEROL 100-25 MCG/ACT IN AEPB: 1.0000 | INHALATION_SPRAY | Freq: Every day | RESPIRATORY_TRACT | 2 refills | Status: AC

## 2023-06-08 MED ORDER — SPIRONOLACTONE 25 MG PO TABS
25.0000 mg | ORAL_TABLET | Freq: Every day | ORAL | Status: DC
Start: 2023-06-08 — End: 2023-06-08

## 2023-06-08 MED ORDER — LORATADINE 10 MG PO TABS: 10.0000 mg | ORAL_TABLET | Freq: Every evening | ORAL | 0 refills | Status: DC

## 2023-06-08 MED ORDER — HYDROCHLOROTHIAZIDE 12.5 MG PO TABS
12.5000 mg | ORAL_TABLET | Freq: Every day | ORAL | Status: DC
  Filled 2023-06-08: qty 1

## 2023-06-08 NOTE — ED Notes (Signed)
Informed rn bed assigned 

## 2023-06-08 NOTE — Discharge Summary (Signed)
 [facility] Discharge Summary   Patient: [name] WNU:[id number]  PCP: [facility]  Date of admission: 06/07/2023   Date of discharge:  06/08/2023     Discharge Diagnoses:  Principal Problem:   Elevated troponin Active Problems:   Acute cough   Coronary artery disease of native artery of native heart with stable angina pectoris (HCC)   Paroxysmal atrial fibrillation (HCC)   Admitted From: Home Disposition:  Home   Recommendations for Outpatient Follow-up:  PCP: In 1 week Follow-up with cardiology in 1 week for ischemic workup as an outpatient Follow up LABS/TEST:     Follow-up Information     [facility] Follow up in 1 week(s).   Contact information: [address] [place] [address] [phone]                Diet recommendation: Cardiac diet  Activity: The patient is advised to gradually reintroduce usual activities, as tolerated  Discharge Condition: stable  Code Status: Full code   History of present illness: As per the H and P dictated on admission Hospital Course:  [name] is a 73 y.o. female with medical history significant of chronic A-flutter status post recent ablation, on chronic anticoagulation, HTN CAD, cigarette smoking, presented with persistent cough.   Patient recently underwent ablation for chronic A-flutter on March 11, since then patient has developed a combination of symptoms including postnasal drip, sore throat and dry cough, denied any sputum production no chest pain no fever or chills.  Dry cough appears to be more severe at night, no night sweat no weight loss.  Yesterday, patient checked her blood pressure and found blood pressure more than 200 and decided to come to ED.   ED Course: Afebrile, borderline bradycardia blood pressure 170/78, also saturation of 100% on room air.  Chest x-ray negative for acute findings, blood work showed sodium 136 chloride 103, K3.9, bicarb 25, BUN  24 creatinine 0.7, WBC 14.3, hemoglobin 14.5.  Troponin elevated 69> 66.  EKG showed no acute ST changes.  Assessment/Plan  # CAD, slightly elevated troponin, less likely ACS No significant chest pain or ST changes on EKG Cardiology consulted, patient was cleared on current medications, recommended ischemic workup as an outpatient.  Patient agreed with the cardiology plan and agreed with the discharge planning.   Continued Eliquis in place of aspirin TTE LVEF 60 to 65%.  No wall motion abnormality, severely dilated left atrium.  No significant valvular abnormality.  # Dry cough could be secondary to allergies and sinuses Negative flu, COVID and RSV.  RVP panel negative Continued symptomatic treatment, Tessalon and guaifenesin Prescribed Claritin.  Recommended to follow-up with PCP. Patient has history of COPD and ran out of inhaler.  Prescribed Breo Ellipta inhaler and albuterol as needed    # Hypokalemia: Potassium repleted. Magnesium and phosphorus level wnl # Paroxysm flutter status post ablation, In sinus rhythm. Resumed Eliquis # Hypertension, continued amlodipine and losartan home dose. # Hypothyroidism: No acute concerns, continue Synthroid  Body mass index is 28.79 kg/m.  Nutrition Interventions:  - Patient was instructed, not to drive, operate heavy machinery, perform activities at heights, swimming or participation in water activities or provide baby sitting services while on Pain, Sleep and Anxiety Medications; until her outpatient Physician has advised to do so again.  - Also recommended to not to take more than prescribed Pain, Sleep and Anxiety Medications.  Patient was ambulatory without any assistance. On the day of  the discharge the patient's vitals were stable, and no other acute medical condition were reported by patient. the patient was felt safe to be discharge at Home.  Consultants: Cardiology Procedures: None  Discharge Exam: General: Appear in no distress,  no Rash; Oral Mucosa Clear, moist. Cardiovascular: S1 and S2 Present, no Murmur, Respiratory: normal respiratory effort, Bilateral Air entry present and no Crackles, no wheezes Abdomen: Bowel Sound present, Soft and no tenderness, no hernia Extremities: no Pedal edema, no calf tenderness Neurology: alert and oriented to time, place, and person affect appropriate.  Filed Weights   06/07/23 0002  Weight: 78.5 kg   Vitals:   06/08/23 1300 06/08/23 1400  BP: 107/73 124/66  Pulse: (!) 56 (!) 56  Resp: 19   Temp:    SpO2: 97% 95%    DISCHARGE MEDICATION: Allergies as of 06/08/2023       Reactions   Lisinopril Swelling   Swelling of lips and face.    Tizanidine Other (See Comments)   bradycardia   Amoxicillin Itching, Rash   Latex Itching, Rash        Medication List     STOP taking these medications    colchicine 0.6 MG tablet       TAKE these medications    acetaminophen 650 MG CR tablet Commonly known as: TYLENOL Take 650-1,300 mg by mouth every 8 (eight) hours as needed for pain.   albuterol 108 (90 Base) MCG/ACT inhaler Commonly known as: VENTOLIN HFA Inhale 1-2 puffs into the lungs every 4 (four) hours as needed for wheezing or shortness of breath. Inhale 2-4 puffs by mouth every 4 hours as needed for wheezing, cough, and/or shortness of breath What changed:  how much to take how to take this when to take this reasons to take this   amLODipine 10 MG tablet Commonly known as: NORVASC Take 1 tablet (10 mg total) by mouth daily.   apixaban 5 MG Tabs tablet Commonly known as: ELIQUIS Take 1 tablet (5 mg total) by mouth 2 (two) times daily.   ascorbic acid 500 MG tablet Commonly known as: VITAMIN C Take 500 mg by mouth daily.   benzonatate 200 MG capsule Commonly known as: TESSALON Take 1 capsule (200 mg total) by mouth 3 (three) times daily as needed for cough.   cholecalciferol 1000 units tablet Commonly known as: VITAMIN D Take 1,000 Units by  mouth daily.   clindamycin 300 MG capsule Commonly known as: CLEOCIN Take 300 mg by mouth 3 (three) times daily.   fluticasone furoate-vilanterol 100-25 MCG/ACT Aepb Commonly known as: Breo Ellipta Inhale 1 puff into the lungs daily.   levothyroxine 100 MCG tablet Commonly known as: SYNTHROID Take 100 mcg by mouth daily before breakfast.   lidocaine 5 % ointment Commonly known as: XYLOCAINE Apply 1 Application topically as needed.   loratadine 10 MG tablet Commonly known as: Claritin Take 1 tablet (10 mg total) by mouth every evening for 10 days.   losartan 100 MG tablet Commonly known as: COZAAR Take 1 tablet (100 mg total) by mouth daily.   pantoprazole 40 MG tablet Commonly known as: Protonix Take 1 tablet (40 mg total) by mouth daily.       Allergies  Allergen Reactions   Lisinopril Swelling    Swelling of lips and face.    Tizanidine Other (See Comments)    bradycardia   Amoxicillin Itching and Rash   Latex Itching and Rash   Discharge Instructions     Call  MD for:  difficulty breathing, headache or visual disturbances   Complete by: As directed    Call MD for:  extreme fatigue   Complete by: As directed    Call MD for:  persistant dizziness or light-headedness   Complete by: As directed    Call MD for:  persistant nausea and vomiting   Complete by: As directed    Call MD for:  severe uncontrolled pain   Complete by: As directed    Call MD for:  temperature >100.4   Complete by: As directed    Diet - low sodium heart healthy   Complete by: As directed    Discharge instructions   Complete by: As directed    Follow-up with PCP in 1 week Follow-up with cardiology in 1 to 2 weeks for ischemic workup as an outpatient.   Increase activity slowly   Complete by: As directed        The results of significant diagnostics from this hospitalization (including imaging, microbiology, ancillary and laboratory) are listed below for reference.    Significant  Diagnostic Studies: ECHOCARDIOGRAM COMPLETE Result Date: 06/08/2023    ECHOCARDIOGRAM REPORT   Patient Name:   ALEXIE LANNI Date of Exam: 06/07/2023 Medical Rec #:  [id number]       Height:       65.0 in Accession #:    [id number]      Weight:       173.0 lb Date of Birth:  08/25/1950       BSA:          1.860 m Patient Age:    72 years        BP:           129/84 mmHg Patient Gender: F               HR:           66 bpm. Exam Location:  [facility] Procedure: 2D Echo, Cardiac Doppler, Color Doppler and Intracardiac            Opacification Agent (Both Spectral and Color Flow Doppler were            utilized during procedure). Indications:     Elevated Troponin  History:         Patient has prior history of Echocardiogram examinations, most                  recent 10/20/2022. Arrythmias:Atrial Flutter and LBBB; Risk                  Factors:Hypertension and Current Smoker.  Sonographer:     Daphine Deutscher RDCS Referring Phys:  [id number] Raymon Mutton DUNN Diagnosing Phys: Lorine Bears MD IMPRESSIONS  1. Left ventricular ejection fraction, by estimation, is 60 to 65%. The left ventricle has normal function. The left ventricle has no regional wall motion abnormalities. There is mild asymmetric left ventricular hypertrophy of the basal-septal segment. Left ventricular diastolic parameters are indeterminate.  2. Right ventricular systolic function is normal. The right ventricular size is normal. Tricuspid regurgitation signal is inadequate for assessing PA pressure.  3. Left atrial size was severely dilated.  4. Right atrial size was mildly dilated.  5. The mitral valve is normal in structure. Trivial mitral valve regurgitation. No evidence of mitral stenosis.  6. The aortic valve is normal in structure. Aortic valve regurgitation is not visualized. Aortic valve sclerosis is present, with no evidence of aortic valve stenosis.  7.  The inferior vena cava is normal in size with greater than 50% respiratory variability,  suggesting right atrial pressure of 3 mmHg. FINDINGS  Left Ventricle: Left ventricular ejection fraction, by estimation, is 60 to 65%. The left ventricle has normal function. The left ventricle has no regional wall motion abnormalities. Definity contrast agent was given IV to delineate the left ventricular  endocardial borders. The left ventricular internal cavity size was normal in size. There is mild asymmetric left ventricular hypertrophy of the basal-septal segment. Left ventricular diastolic parameters are indeterminate. Right Ventricle: The right ventricular size is normal. No increase in right ventricular wall thickness. Right ventricular systolic function is normal. Tricuspid regurgitation signal is inadequate for assessing PA pressure. Left Atrium: Left atrial size was severely dilated. Right Atrium: Right atrial size was mildly dilated. Pericardium: There is no evidence of pericardial effusion. Mitral Valve: The mitral valve is normal in structure. Trivial mitral valve regurgitation. No evidence of mitral valve stenosis. Tricuspid Valve: The tricuspid valve is normal in structure. Tricuspid valve regurgitation is not demonstrated. No evidence of tricuspid stenosis. Aortic Valve: The aortic valve is normal in structure. Aortic valve regurgitation is not visualized. Aortic valve sclerosis is present, with no evidence of aortic valve stenosis. Pulmonic Valve: The pulmonic valve was normal in structure. Pulmonic valve regurgitation is not visualized. No evidence of pulmonic stenosis. Aorta: The aortic root is normal in size and structure. Venous: The inferior vena cava is normal in size with greater than 50% respiratory variability, suggesting right atrial pressure of 3 mmHg. IAS/Shunts: No atrial level shunt detected by color flow Doppler.  LEFT VENTRICLE PLAX 2D LVIDd:         5.00 cm   Diastology LVIDs:         3.20 cm   LV e' medial:    5.66 cm/s LV PW:         1.00 cm   LV E/e' medial:  15.9 LV IVS:         1.00 cm   LV e' lateral:   6.27 cm/s LVOT diam:     2.10 cm   LV E/e' lateral: 14.4 LV SV:         79 LV SV Index:   43 LVOT Area:     3.46 cm  RIGHT VENTRICLE RV Basal diam:  4.30 cm RV S prime:     18.05 cm/s TAPSE (M-mode): 2.4 cm LEFT ATRIUM              Index        RIGHT ATRIUM           Index LA diam:        5.60 cm  3.01 cm/m   RA Area:     15.00 cm LA Vol (A2C):   100.0 ml 53.77 ml/m  RA Volume:   39.40 ml  21.18 ml/m LA Vol (A4C):   112.0 ml 60.22 ml/m LA Biplane Vol: 106.0 ml 56.99 ml/m  AORTIC VALVE LVOT Vmax:   113.50 cm/s LVOT Vmean:  68.800 cm/s LVOT VTI:    0.230 m  AORTA Ao Root diam: 3.50 cm Ao Asc diam:  3.60 cm MITRAL VALVE MV Area (PHT): 3.66 cm    SHUNTS MV Decel Time: 208 msec    Systemic VTI:  0.23 m MV E velocity: 90.15 cm/s  Systemic Diam: 2.10 cm MV A velocity: 80.15 cm/s MV E/A ratio:  1.12 Lorine Bears MD Electronically signed by Lorine Bears MD Signature Date/Time: 06/08/2023/8:57:59  AM    Final    DG Chest 2 View Result Date: 06/07/2023 CLINICAL DATA:  Chest pain.  Nonproductive cough.  Congestion. EXAM: CHEST - 2 VIEW COMPARISON:  04/15/2023 FINDINGS: Mild cardiac enlargement. No vascular congestion, edema, or consolidation. Mild linear scarring in the left mid lung is unchanged since prior study. Lungs are otherwise clear. No pleural effusion or pneumothorax. Mediastinal contours appear intact. Calcification of the aorta. Degenerative changes in the spine and shoulders. IMPRESSION: Cardiac enlargement.  No evidence of active pulmonary disease. Electronically Signed   By: Burman Nieves M.D.   On: 06/07/2023 00:53   EP STUDY Result Date: 05/24/2023 CONCLUSIONS: 1. Successful PVI 2. Successful ablation/isolation of the posterior wall 3. Intracardiac echo reveals trivial pericardial effusion, left sided common PV ostium and a dilated LA 4. No early apparent complications. 5. Colchicine 0.6mg  PO BID x 5 days 6. Protonix 40mg  PO daily x 45 days   CT CARDIAC  MORPH/PULM VEIN W/CM&W/O CA SCORE Addendum Date: 05/20/2023 ADDENDUM REPORT: 05/20/2023 08:33 EXAM: OVER-READ INTERPRETATION  CT CHEST The following report is an over-read performed by radiologist Dr. Shari Prows [facility] on 05/20/2023. This over-read does not include interpretation of cardiac or coronary anatomy or pathology. The interpretation by the cardiologist is attached. COMPARISON:  Chest CTA 09/24/2015 FINDINGS: No covered mediastinal mass or adenopathy. Minimal coverage of the liver shows numerous hypervascular nodules measuring up to 12 mm. No acute osseous finding. There are mild bands of scarring or atelectasis at the left lung base. Few bilateral micro nodules on lung subtraction windows, significance to be determined by liver workup. IMPRESSION: 1. Minimally covered liver shows multiple hypervascular nodules. No available comparison imaging, recommend liver MRI with contrast and correlation for chronic liver disease/cirrhosis risk factors. 2. A few micro nodules seen in the bilateral lungs, usually not followed at this small size when incidental but significance to be determined by workup of the above. Electronically Signed   By: Tiburcio Pea M.D.   On: 05/20/2023 08:33   Result Date: 05/20/2023 CLINICAL DATA:  Atrial fibrillation scheduled for an ablation. EXAM: Cardiac CT/CTA TECHNIQUE: The patient was scanned on a Siemens Somatom scanner. FINDINGS: A 120 kV prospective scan was triggered in the descending thoracic aorta at 111 HU's. Gantry rotation speed was 280 msecs and collimation was .9 mm. No beta blockade and no NTG was given. The 3D data set was reconstructed in 5% intervals of the 60-80 % of the R-R cycle. Diastolic phases were analyzed on a dedicated work station using MPR, MIP and VRT modes. The patient received 75 cc of contrast. There is normal pulmonary vein drainage into the left atrium (2 on the right and 2 on the left via a common trunk on left with ostial  measurements as follows: RUPV: 24 x 15 mm, Area 26 mm2 RLPV: 23 x 22 mm, Area 37 mm2 LUPV and LLPV originate via a common trunk: 28 x 23 mm, 48 mm2. The left atrial appendage is a Windsock-cactus type with two lobes and ostial size 38 x 27 mm and length 46 mm, Area 74 mm2. There is no thrombus in the left atrial appendage. The esophagus runs in the left atrial midline and is not in the proximity to any of the pulmonary veins. Aorta:  Normal caliber.  No dissection or calcifications. Aortic Valve:  Trileaflet.  No calcifications. Coronary Arteries: Normal coronary origin. Right dominance. The study was performed without use of NTG and insufficient for plaque evaluation. IMPRESSION: 1.  There is normal pulmonary vein drainage into the left atrium. (2 on the right and 2 on the left via a common trunk on left) with ostial measurements as above. 2. The left atrial appendage is a Windsock-cactus type with two lobes and ostial size 38 x 27 mm and length 46 mm, Area 74 mm2. There is no thrombus in the left atrial appendage. 3. The esophagus runs in the left atrial midline and is not in the proximity to any of the pulmonary veins. 4. Coronary calcium score 672. This is 94th percentile for age/gender. Electronically Signed: By: Debbe Odea M.D. On: 05/16/2023 13:50   DG Toe Great Right Result Date: 05/16/2023 CLINICAL DATA:  Great toe pain EXAM: RIGHT GREAT TOE COMPARISON:  None Available. FINDINGS: There is no evidence of fracture or dislocation. There is no evidence of arthropathy or other focal bone abnormality. Questionable prominence of the soft tissues around the distal phalanx of the first toe could correlate with nonspecific inflammatory changes. No evidence of osteomyelitis. IMPRESSION: Negative. Electronically Signed   By: Shaaron Adler M.D.   On: 05/16/2023 08:38    Microbiology: Recent Results (from the past 240 hours)  Resp panel by RT-PCR (RSV, Flu A&B, Covid) Anterior Nasal Swab     Status: None    Collection Time: 06/07/23 12:13 AM   Specimen: Anterior Nasal Swab  Result Value Ref Range Status   SARS Coronavirus 2 by RT PCR NEGATIVE NEGATIVE Final    Comment: (NOTE) SARS-CoV-2 target nucleic acids are NOT DETECTED.  The SARS-CoV-2 RNA is generally detectable in upper respiratory specimens during the acute phase of infection. The lowest concentration of SARS-CoV-2 viral copies this assay can detect is 138 copies/mL. A negative result does not preclude SARS-Cov-2 infection and should not be used as the sole basis for treatment or other patient management decisions. A negative result may occur with  improper specimen collection/handling, submission of specimen other than nasopharyngeal swab, presence of viral mutation(s) within the areas targeted by this assay, and inadequate number of viral copies(<138 copies/mL). A negative result must be combined with clinical observations, patient history, and epidemiological information. The expected result is Negative.  Fact Sheet for Patients:  BloggerCourse.com  Fact Sheet for Healthcare Providers:  SeriousBroker.it  This test is no t yet approved or cleared by the [place] FDA and  has been authorized for detection and/or diagnosis of SARS-CoV-2 by FDA under an Emergency Use Authorization (EUA). This EUA will remain  in effect (meaning this test can be used) for the duration of the COVID-19 declaration under Section 564(b)(1) of the Act, 21 U.S.C.section 360bbb-3(b)(1), unless the authorization is terminated  or revoked sooner.       Influenza A by PCR NEGATIVE NEGATIVE Final   Influenza B by PCR NEGATIVE NEGATIVE Final    Comment: (NOTE) The Xpert Xpress SARS-CoV-2/FLU/RSV plus assay is intended as an aid in the diagnosis of influenza from Nasopharyngeal swab specimens and should not be used as a sole basis for treatment. Nasal washings and aspirates are unacceptable for  Xpert Xpress SARS-CoV-2/FLU/RSV testing.  Fact Sheet for Patients: BloggerCourse.com  Fact Sheet for Healthcare Providers: SeriousBroker.it  This test is not yet approved or cleared by the [place] FDA and has been authorized for detection and/or diagnosis of SARS-CoV-2 by FDA under an Emergency Use Authorization (EUA). This EUA will remain in effect (meaning this test can be used) for the duration of the COVID-19 declaration under Section 564(b)(1) of the Act, 21 U.S.C.  section 360bbb-3(b)(1), unless the authorization is terminated or revoked.     Resp Syncytial Virus by PCR NEGATIVE NEGATIVE Final    Comment: (NOTE) Fact Sheet for Patients: BloggerCourse.com  Fact Sheet for Healthcare Providers: SeriousBroker.it  This test is not yet approved or cleared by the [place] FDA and has been authorized for detection and/or diagnosis of SARS-CoV-2 by FDA under an Emergency Use Authorization (EUA). This EUA will remain in effect (meaning this test can be used) for the duration of the COVID-19 declaration under Section 564(b)(1) of the Act, 21 U.S.C. section 360bbb-3(b)(1), unless the authorization is terminated or revoked.  Performed at [facility], [address] [place], [place] [address]   Respiratory (~20 pathogens) panel by PCR     Status: None   Collection Time: 06/07/23  9:27 AM   Specimen: Nasopharyngeal Swab; Respiratory  Result Value Ref Range Status   Adenovirus NOT DETECTED NOT DETECTED Final   Coronavirus 229E NOT DETECTED NOT DETECTED Final    Comment: (NOTE) The Coronavirus on the Respiratory Panel, DOES NOT test for the novel  Coronavirus (2019 nCoV)    Coronavirus HKU1 NOT DETECTED NOT DETECTED Final   Coronavirus NL63 NOT DETECTED NOT DETECTED Final   Coronavirus OC43 NOT DETECTED NOT DETECTED Final   Metapneumovirus NOT DETECTED NOT  DETECTED Final   Rhinovirus / Enterovirus NOT DETECTED NOT DETECTED Final   Influenza A NOT DETECTED NOT DETECTED Final   Influenza B NOT DETECTED NOT DETECTED Final   Parainfluenza Virus 1 NOT DETECTED NOT DETECTED Final   Parainfluenza Virus 2 NOT DETECTED NOT DETECTED Final   Parainfluenza Virus 3 NOT DETECTED NOT DETECTED Final   Parainfluenza Virus 4 NOT DETECTED NOT DETECTED Final   Respiratory Syncytial Virus NOT DETECTED NOT DETECTED Final   Bordetella pertussis NOT DETECTED NOT DETECTED Final   Bordetella Parapertussis NOT DETECTED NOT DETECTED Final   Chlamydophila pneumoniae NOT DETECTED NOT DETECTED Final   Mycoplasma pneumoniae NOT DETECTED NOT DETECTED Final    Comment: Performed at [facility], [address], [place], [place] [address]     Labs: CBC: Recent Labs  Lab 06/07/23 0013 06/08/23 0532  WBC 9.9 6.3  NEUTROABS 7.4  --   HGB 11.4* 10.9*  HCT 36.3 35.0*  MCV 76.9* 76.8*  PLT 225 216   Basic Metabolic Panel: Recent Labs  Lab 06/07/23 0013 06/07/23 0602 06/08/23 0532  NA 137  --  141  K 2.8*  --  3.4*  CL 102  --  108  CO2 24  --  27  GLUCOSE 114*  --  93  BUN 12  --  10  CREATININE 0.89  --  0.82  CALCIUM 9.1  --  8.8*  MG  --  2.2  --   PHOS  --  3.3  --    Liver Function Tests: Recent Labs  Lab 06/07/23 0013  AST 18  ALT 13  ALKPHOS 56  BILITOT 0.3  PROT 7.4  ALBUMIN 4.4   No results for input(s): "LIPASE", "AMYLASE" in the last 168 hours. No results for input(s): "AMMONIA" in the last 168 hours. Cardiac Enzymes: No results for input(s): "CKTOTAL", "CKMB", "CKMBINDEX", "TROPONINI" in the last 168 hours. BNP (last 3 results) Recent Labs    06/07/23 0602  BNP 146.3*   CBG: No results for input(s): "GLUCAP" in the last 168 hours.  Time spent: 35 minutes  Signed:  [name]  [facility] 06/08/2023 2:38 PM

## 2023-06-08 NOTE — Progress Notes (Signed)
 Progress Note  Patient Name: [name] Date of Encounter: 06/08/2023  Primary Cardiologist: Kirke Corin Primary Electrophysiologist:  Lalla Brothers   Subjective   Cough is much better this morning. No chest pain, dyspnea, or palpitations. This morning, she tells [place] that her cough actually started prior to her ablation, after anesthesia evaluated her. Has been on losartan for "a minute." Echo pending.  Inpatient Medications    Scheduled Meds:  amLODipine  10 mg Oral Daily   clindamycin  300 mg Oral TID   enoxaparin (LOVENOX) injection  80 mg Subcutaneous Q12H   guaiFENesin  1,200 mg Oral BID   hydrochlorothiazide  12.5 mg Oral Daily   levothyroxine  100 mcg Oral QAC breakfast   losartan  100 mg Oral Daily   pantoprazole  40 mg Oral Daily   Continuous Infusions:  PRN Meds: acetaminophen, albuterol, benzonatate, ondansetron **OR** ondansetron (ZOFRAN) IV   Vital Signs    Vitals:   06/08/23 0530 06/08/23 0533 06/08/23 0700 06/08/23 0745  BP: 130/61  138/65   Pulse: (!) 59   65  Resp: (!) 24  16 16   Temp:  97.8 F (36.6 C)    TempSrc:  Oral    SpO2: 99%   98%  Weight:      Height:       No intake or output data in the 24 hours ending 06/08/23 0752 Filed Weights   06/07/23 0002  Weight: 78.5 kg    Telemetry    Sinus rhythm with sinus bradycardia, 50s to 90s bpm - Personally Reviewed  ECG    No new tracings - Personally Reviewed  Physical Exam   GEN: No acute distress.   Neck: No JVD. Cardiac: RRR, no murmurs, rubs, or gallops.  Respiratory: Clear to auscultation bilaterally.  GI: Soft, nontender, non-distended.   MS: No edema; No deformity. Neuro:  Alert and oriented x 3; Nonfocal.  Psych: Normal affect.  Labs    Chemistry Recent Labs  Lab 06/07/23 0013 06/08/23 0532  NA 137 141  K 2.8* 3.4*  CL 102 108  CO2 24 27  GLUCOSE 114* 93  BUN 12 10  CREATININE 0.89 0.82  CALCIUM 9.1 8.8*  PROT 7.4  --   ALBUMIN 4.4  --   AST 18  --   ALT 13  --    ALKPHOS 56  --   BILITOT 0.3  --   GFRNONAA >60 >60  ANIONGAP 11 6     Hematology Recent Labs  Lab 06/07/23 0013 06/08/23 0532  WBC 9.9 6.3  RBC 4.72 4.56  HGB 11.4* 10.9*  HCT 36.3 35.0*  MCV 76.9* 76.8*  MCH 24.2* 23.9*  MCHC 31.4 31.1  RDW 15.1 15.2  PLT 225 216    Cardiac EnzymesNo results for input(s): "TROPONINI" in the last 168 hours. No results for input(s): "TROPIPOC" in the last 168 hours.   BNP Recent Labs  Lab 06/07/23 0602  BNP 146.3*     DDimer No results for input(s): "DDIMER" in the last 168 hours.   Radiology    DG Chest 2 View Result Date: 06/07/2023 IMPRESSION: Cardiac enlargement.  No evidence of active pulmonary disease. Electronically Signed   By: Burman Nieves M.D.   On: 06/07/2023 00:53    Cardiac Studies   Coronary morphology 05/16/2023: 1. There is normal pulmonary vein drainage into the left atrium. (2 on the right and 2 on the left via a common trunk on left) with ostial measurements as above.  2. The left atrial appendage is a Windsock-cactus type with two lobes and ostial size 38 x 27 mm and length 46 mm, Area 74 mm2. There is no thrombus in the left atrial appendage.   3. The esophagus runs in the left atrial midline and is not in the proximity to any of the pulmonary veins.   4. Coronary calcium score 672. This is 94th percentile for age/gender. __________   2D echo 10/20/2022: 1. Left ventricular ejection fraction, by estimation, is 55 to 60%. The  left ventricle has normal function. The left ventricle has no regional  wall motion abnormalities. Left ventricular diastolic parameters are  consistent with Grade II diastolic  dysfunction (pseudonormalization). Elevated left atrial pressure. The  average left ventricular global longitudinal strain is -12.7 %. The global  longitudinal strain is abnormal.   2. Right ventricular systolic function is normal. The right ventricular  size is normal. Mildly increased right  ventricular wall thickness. There  is normal pulmonary artery systolic pressure.   3. Left atrial size was severely dilated.   4. Right atrial size was moderately dilated.   5. A small pericardial effusion is present. The pericardial effusion is  localized near the right atrium and anterior to the right ventricle.   6. The mitral valve is grossly normal. Mild to moderate mitral valve  regurgitation. No evidence of mitral stenosis.   7. The aortic valve was not well visualized. Aortic valve regurgitation  is not visualized. No aortic stenosis is present.   8. The inferior vena cava is normal in size with <50% respiratory  variability, suggesting right atrial pressure of 8 mmHg.  __________   Luci Bank patch 09/2022: Monitor 1 Patient had a min HR of 38 bpm, max HR of 174 bpm, and avg HR of 57 bpm. Predominant underlying rhythm was Sinus Rhythm. 10 Supraventricular Tachycardia runs occurred, the run with the fastest interval lasting 15 beats with a max rate of 174 bpm (avg 138  bpm); the run with the fastest interval was also the longest. Idioventricular Rhythm was present.  Rare PACs and rare PVCs.   Monitor 2 Patient had a min HR of 35 bpm, max HR of 203 bpm, and avg HR of 58 bpm. Predominant underlying rhythm was Sinus Rhythm. 1 run of Ventricular Tachycardia occurred lasting 5 beats with a max rate of 133 bpm (avg 128 bpm). 20 Supraventricular Tachycardia  runs occurred, the run with the fastest interval lasting 5 beats with a max rate of 203 bpm, the longest lasting 15 beats with an avg rate of 100 bpm.  Rare PACs and rare PVCs. __________   2D echo 08/23/2019: 1. Left ventricular ejection fraction, by estimation, is 55 to 60%. The  left ventricle has normal function. The left ventricle has no regional  wall motion abnormalities. Left ventricular diastolic parameters are  consistent with Grade II diastolic  dysfunction (pseudonormalization).   2. Right ventricular systolic function is  normal. The right ventricular  size is normal. There is moderately elevated pulmonary artery systolic  pressure.   3. Left atrial size was severely dilated.   4. The mitral valve is normal in structure. Mild to moderate mitral valve  regurgitation.  ___________   24-hour Holter 11/2015: Overall rhythm was sinus. Heart rate ranged from 45-127 BPM, average of 62 BPM. The maximum heart rate was actually a run of atrial tachycardia, as described below. Longest RR interval was normal at 1.76 seconds.   Supraventricular ectopy: 193 isolated PACs. 11 atrial  couplets. 3 runs totaling 26 beats - 17 beats run of likely atrial tachycardia at 128 BPM as the longest. The fastest was a 5 beat run of likely atrial tachycardia at 154 BPM.   Ventricular ectopy: 80 isolated PVCs. 2 ventricular couplets. No ventricular runs.   No documented atrial fibrillation. __________   Eugenie Birks MPI 09/25/2015: Rhythm remained in atrial flutter throughout the study. There was no ST segment deviation noted during stress. Myocardial perfusion imaging revealed normal perfusion on stress and rest. No evidence of ischemia. This is a low risk study. Nuclear stress EF: 47%. Please verify with echocardiogram. __________   2D echo 09/23/2015: - Left ventricle: The cavity size was normal. Wall thickness was    increased in a pattern of moderate LVH. Systolic function was    normal. The estimated ejection fraction was in the range of 50%    to 55%. Wall motion was normal; there were no regional wall    motion abnormalities.  - Mitral valve: Moderately calcified annulus.  - Left atrium: The atrium was mildly dilated.  - Right ventricle: The cavity size was mildly dilated.  - Right atrium: The atrium was moderately to severely dilated.  - Pericardium, extracardiac: A trivial pericardial effusion was    identified.   Patient Profile     73 y.o. female with history of CAD noted on CT imaging, atrial flutter ablation in 2017,  A-fib status post recent catheter ablation on 05/20/2023, bradycardia, mild to moderate mitral regurgitation, HTN with prior angioedema on lisinopril, and LVH who is being seen today for the evaluation of elevated high-sensitivity troponin at the request of Dr. Chipper Herb.   Assessment & Plan    1. CAD involving the native coronary arteries with elevated high-sensitivity troponin: -Mildly elevated and flat trending, not consistent with ACS -Never with chest pain or dyspnea  -However, patient was noted to have significantly elevated coronary artery calcium score on CT morphology leading up to A-fib ablation -Preliminary read on echo with preserved LV systolic function -Defer addition of heparin drip for now -Apixaban in place of aspirin -LDL 53, A1c 5.4 -Plan for outpatient noninvasive ischemic testing    2.  Dry cough: -Much improved -Reports this morning that her cough developed just prior to her ablation -Unlikely related to PVI -Preliminary review of echo is reassuring  -Cannot exclude viral URI -With improvement in cough, we will continue losartan for now, low threshold to transition to alternative medication   3.  A-fib/flutter: -Status post atrial flutter ablation in 2017 with development of A-fib in 09/2022 with recurrence of atrial arrhythmia in 03/2023 now status post A-fib ablation on 05/24/2023 -Stop Lovenox -Resume PTA Eliquis -Has completed colchicine course -PTA Protonix post ablation -Obtain echo as outlined above -CHA2DS2-VASc at least   4.  HTN with LVH: -Improving -For now, remains on losartan, hydrochlorothiazide, and amlodipine  -Titrate antihypertensive therapy as indicated -Unable to use beta-blocker with bradycardic rate   5.  Hypokalemia: -Replete to goal 4.0 -Could consider spironolactone down the road, particularly if cough returns in an effort to see how she trends off ARB -Magnesium at goal   6.  Microcytic anemia: -No obvious bleeding -Trend   7.   Abnormal CT imaging: -Multiple hypervascular hepatic nodules noted coronary morphology CT with recommendation for MRI of the liver with contrast -Recommend further discussion and follow up of this in the outpatient setting   8. Mitral regurgitation: -Mild to moderate by echo in 10/2022 -Echo pending as outlined above -Outpatient  follow-up      For questions or updates, please contact [facility] Please consult [email or web address] for contact info under Cardiology/STEMI.    Signed, Eula Listen, PA-C [facility] Pager: [phone] 06/08/2023, 7:52 AM

## 2023-06-08 NOTE — Care Management Obs Status (Signed)
 MEDICARE OBSERVATION STATUS NOTIFICATION   Patient Details  Name: [name] MRN: [id number] Date of Birth: [birth year]   Medicare Observation Status Notification Given:  Orland Dec, CMA 06/08/2023, 10:18 AM

## 2023-06-10 ENCOUNTER — Encounter (INDEPENDENT_AMBULATORY_CARE_PROVIDER_SITE_OTHER): Payer: Self-pay | Admitting: Nurse Practitioner

## 2023-06-10 ENCOUNTER — Ambulatory Visit (INDEPENDENT_AMBULATORY_CARE_PROVIDER_SITE_OTHER): Payer: 59

## 2023-06-10 ENCOUNTER — Ambulatory Visit (INDEPENDENT_AMBULATORY_CARE_PROVIDER_SITE_OTHER): Payer: 59 | Admitting: Nurse Practitioner

## 2023-06-10 VITALS — BP 161/74 | HR 55 | Resp 18 | Wt 175.6 lb

## 2023-06-10 DIAGNOSIS — I999 Unspecified disorder of circulatory system: Secondary | ICD-10-CM | POA: Diagnosis not present

## 2023-06-10 DIAGNOSIS — L97909 Non-pressure chronic ulcer of unspecified part of unspecified lower leg with unspecified severity: Secondary | ICD-10-CM

## 2023-06-10 DIAGNOSIS — I10 Essential (primary) hypertension: Secondary | ICD-10-CM

## 2023-06-10 DIAGNOSIS — I70299 Other atherosclerosis of native arteries of extremities, unspecified extremity: Secondary | ICD-10-CM

## 2023-06-10 NOTE — Progress Notes (Incomplete)
 Subjective:    Patient ID: [name], female    DOB: [birth year], 73 y.o.   MRN: [id number] Chief Complaint  Patient presents with  . New Patient (Initial Visit)    Ref Lucretia Roers consult bilateral leg pain    HPI  Review of Systems     Objective:   Physical Exam  BP (!) 161/74   Pulse (!) 55   Resp 18   Wt 175 lb 9.6 oz (79.7 kg)   BMI 29.22 kg/m   Past Medical History:  Diagnosis Date  . Anemia   . Arthritis   . Atrial flutter (HCC) 09/2015   a.) CHADS2VASc 2 (sex, HTN); b.) s/p DCCV 11/11/2015 (150 J); c.) s/p RFCA 01/17/2016; d.) rate/rhythm maintained without pharmacological intervention; no chronic anticoagulation.  . Coronary artery calcification seen on CT scan   . Diastolic dysfunction 08/23/2019   a.)  TTE 08/23/2019: EF 55-60%, severe LAE, mild-mod MR, PASP 46.2, G2DD  . Dyspnea   . Essential hypertension   . GERD (gastroesophageal reflux disease)   . Hepatic steatosis   . Incomplete left bundle branch block (LBBB)   . Pneumonia    as a child  . Subclinical hypothyroidism   . Tobacco abuse     Social History   Socioeconomic History  . Marital status: Widowed    Spouse name: Not on file  . Number of children: Not on file  . Years of education: Not on file  . Highest education level: Not on file  Occupational History  . Not on file  Tobacco Use  . Smoking status: Former    Current packs/day: 0.00    Types: Cigarettes    Quit date: 11/19/2015    Years since quitting: 7.5  . Smokeless tobacco: Never  Vaping Use  . Vaping status: Never Used  Substance and Sexual Activity  . Alcohol use: Yes    Comment: occasionally  . Drug use: No  . Sexual activity: Yes    Birth control/protection: Surgical  Other Topics Concern  . Not on file  Social History Narrative  . Not on file   Social Drivers of Health   Financial Resource Strain: Not on file  Food Insecurity: Not on file  Transportation Needs: No Transportation Needs (12/10/2021)   PRAPARE -  Transportation   . Lack of Transportation (Medical): No   . Lack of Transportation (Non-Medical): No  Physical Activity: Not on file  Stress: Not on file  Social Connections: Not on file  Intimate Partner Violence: Not At Risk (12/10/2021)   Humiliation, Afraid, Rape, and Kick questionnaire   . Fear of Current or Ex-Partner: No   . Emotionally Abused: No   . Physically Abused: No   . Sexually Abused: No    Past Surgical History:  Procedure Laterality Date  . ABDOMINAL HYSTERECTOMY    . ABLATION OF DYSRHYTHMIC FOCUS  01/16/2016  . ATRIAL FIBRILLATION ABLATION N/A 05/24/2023   Procedure: ATRIAL FIBRILLATION ABLATION;  Surgeon: Lanier Prude, MD;  Location[facility] INVASIVE CV LAB;  Service: Cardiovascular;  Laterality: N/A;  . ELECTROPHYSIOLOGIC STUDY N/A 11/11/2015   Procedure: CARDIOVERSION;  Surgeon: Antonieta Iba, MD;  Location: [facility] ORS;  Service: Cardiovascular;  Laterality: N/A;  . ELECTROPHYSIOLOGIC STUDY N/A 01/16/2016   Procedure: A-Flutter Ablation;  Surgeon: Duke Salvia, MD;  Location: [facility] INVASIVE CV LAB;  Service: Cardiovascular;  Laterality: N/A;  . TOTAL HIP ARTHROPLASTY Right 12/10/2021   Procedure: TOTAL HIP ARTHROPLASTY ANTERIOR APPROACH;  Surgeon: Audelia Acton,  Earna Coder, MD;  Location: [facility] ORS;  Service: Orthopedics;  Laterality: Right;    Family History  Problem Relation Age of Onset  . Hypertension Mother   . Diabetes Mother   . Diabetes Father   . Diabetes Brother   . Hypertension Brother   . Hypertension Brother   . Heart attack Brother   . Diabetes Brother   . Breast cancer Neg Hx     Allergies  Allergen Reactions  . Lisinopril Swelling    Swelling of lips and face.   . Tizanidine Other (See Comments)    bradycardia  . Amoxicillin Itching and Rash  . Latex Itching and Rash       Latest Ref Rng & Units 06/08/2023    5:32 AM 06/07/2023   12:13 AM 05/11/2023    8:34 AM  CBC  WBC 4.0 - 10.5 K/uL 6.3  9.9  8.0   Hemoglobin 12.0 - 15.0 g/dL 29.5   62.1  30.8   Hematocrit 36.0 - 46.0 % 35.0  36.3  44.8   Platelets 150 - 400 K/uL 216  225  224       CMP     Component Value Date/Time   NA 141 06/08/2023 0532   NA 145 (H) 05/11/2023 0834   NA 142 01/06/2014 0038   K 3.4 (L) 06/08/2023 0532   K 3.9 01/06/2014 0038   CL 108 06/08/2023 0532   CL 108 (H) 01/06/2014 0038   CO2 27 06/08/2023 0532   CO2 27 01/06/2014 0038   GLUCOSE 93 06/08/2023 0532   GLUCOSE 96 01/06/2014 0038   BUN 10 06/08/2023 0532   BUN 16 05/11/2023 0834   BUN 10 01/06/2014 0038   CREATININE 0.82 06/08/2023 0532   CREATININE 0.64 01/06/2014 0038   CALCIUM 8.8 (L) 06/08/2023 0532   CALCIUM 8.5 01/06/2014 0038   PROT 7.4 06/07/2023 0013   PROT 7.8 07/14/2011 1311   ALBUMIN 4.4 06/07/2023 0013   ALBUMIN 4.3 07/14/2011 1311   AST 18 06/07/2023 0013   AST 18 07/14/2011 1311   ALT 13 06/07/2023 0013   ALT 13 07/14/2011 1311   ALKPHOS 56 06/07/2023 0013   ALKPHOS 63 07/14/2011 1311   BILITOT 0.3 06/07/2023 0013   BILITOT 0.5 07/14/2011 1311   EGFR 83 05/11/2023 0834   GFRNONAA >60 06/08/2023 0532   GFRNONAA >60 01/06/2014 0038   GFRNONAA >60 07/14/2011 1311     No results found.     Assessment & Plan:   1. 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 Essential hypertension ***   Current Outpatient Medications on File Prior to Visit  Medication Sig Dispense Refill  . acetaminophen (TYLENOL) 650 MG CR tablet Take 650-1,300 mg by mouth every 8 (eight) hours as needed for pain.    Marland Kitchen albuterol (VENTOLIN HFA) 108 (90 Base) MCG/ACT inhaler Inhale 1-2 puffs into the lungs every 4 (four) hours as needed for wheezing or shortness of breath. Inhale 2-4 puffs by mouth every 4 hours as needed for wheezing, cough, and/or shortness of breath 8 g 1  . amLODipine (NORVASC) 10 MG tablet Take 1 tablet (10 mg total) by mouth daily. 30 tablet 11  . apixaban (ELIQUIS) 5 MG TABS tablet Take 1 tablet (5 mg total) by mouth 2 (two)  times daily. 60 tablet 1  . ascorbic acid (VITAMIN C) 500 MG tablet Take 500 mg by mouth daily.    . benzonatate (TESSALON) 200 MG capsule Take 1  capsule (200 mg total) by mouth 3 (three) times daily as needed for cough. 20 capsule 0  . cholecalciferol (VITAMIN D) 1000 units tablet Take 1,000 Units by mouth daily.    . clindamycin (CLEOCIN) 300 MG capsule Take 300 mg by mouth 3 (three) times daily.    . fluticasone furoate-vilanterol (BREO ELLIPTA) 100-25 MCG/ACT AEPB Inhale 1 puff into the lungs daily. 30 each 2  . levothyroxine (SYNTHROID) 100 MCG tablet Take 100 mcg by mouth daily before breakfast.    . lidocaine (XYLOCAINE) 5 % ointment Apply 1 Application topically as needed. 35.44 g 0  . loratadine (CLARITIN) 10 MG tablet Take 1 tablet (10 mg total) by mouth every evening for 10 days. 10 tablet 0  . losartan (COZAAR) 100 MG tablet Take 1 tablet (100 mg total) by mouth daily. 90 tablet 3  . pantoprazole (PROTONIX) 40 MG tablet Take 1 tablet (40 mg total) by mouth daily. 45 tablet 0   No current facility-administered medications on file prior to visit.    There are no Patient Instructions on file for this visit. No follow-ups on file.   Georgiana Spinner, NP

## 2023-06-10 NOTE — Progress Notes (Signed)
 Subjective:    Patient ID: [name], female    DOB: June 18, 1950, 73 y.o.   MRN: [id number] Chief Complaint  Patient presents with   New Patient (Initial Visit)    Ref Lucretia Roers consult bilateral leg pain    The patient is seen for evaluation of possible peripheral arterial disease given painful lower extremities after recently stubbing her toe.  The patient notes that she slipped and stubbed her toe and had significant pain.  She saw her podiatrist and there was significant concern that she may have decreased perfusion.  This is after her toenail began to fall off and there was concern about being able to heal if they were able to remove the toenail.  The patient denies classic claudication-like symptoms but she does have some pain with walking in her right leg.  She attributed this usually to her arthritis.  She has a former smoker.  The patient denies amaurosis fugax or recent TIA symptoms. There are no recent neurological changes noted. The patient denies history of DVT, PE or superficial thrombophlebitis. The patient denies recent episodes of angina or shortness of breath.   Today her ABIs are R=0.48 and L=0.78 TBi R=0.08 L=0.42 and the patient has monophasic tibial artery waveforms bilaterally.    Review of Systems  Musculoskeletal:  Positive for arthralgias.  Skin:  Positive for wound.  All other systems reviewed and are negative.      Objective:   Physical Exam Vitals reviewed.  HENT:     Head: Normocephalic.  Cardiovascular:     Rate and Rhythm: Normal rate.     Pulses:          Dorsalis pedis pulses are detected w/ Doppler on the right side and detected w/ Doppler on the left side.       Posterior tibial pulses are detected w/ Doppler on the right side and detected w/ Doppler on the left side.  Pulmonary:     Effort: Pulmonary effort is normal.  Skin:    General: Skin is warm and dry.  Neurological:     Mental Status: She is alert and oriented to person, place,  and time.  Psychiatric:        Mood and Affect: Mood normal.        Behavior: Behavior normal.        Thought Content: Thought content normal.        Judgment: Judgment normal.     BP (!) 161/74   Pulse (!) 55   Resp 18   Wt 175 lb 9.6 oz (79.7 kg)   BMI 29.22 kg/m   Past Medical History:  Diagnosis Date   Anemia    Arthritis    Atrial flutter (HCC) 09/2015   a.) CHADS2VASc 2 (sex, HTN); b.) s/p DCCV 11/11/2015 (150 J); c.) s/p RFCA 01/17/2016; d.) rate/rhythm maintained without pharmacological intervention; no chronic anticoagulation.   Coronary artery calcification seen on CT scan    Diastolic dysfunction 08/23/2019   a.)  TTE 08/23/2019: EF 55-60%, severe LAE, mild-mod MR, PASP 46.2, G2DD   Dyspnea    Essential hypertension    GERD (gastroesophageal reflux disease)    Hepatic steatosis    Incomplete left bundle branch block (LBBB)    Pneumonia    as a child   Subclinical hypothyroidism    Tobacco abuse     Social History   Socioeconomic History   Marital status: Widowed    Spouse name: Not on file   Number  of children: Not on file   Years of education: Not on file   Highest education level: Not on file  Occupational History   Not on file  Tobacco Use   Smoking status: Former    Current packs/day: 0.00    Types: Cigarettes    Quit date: 11/19/2015    Years since quitting: 7.5   Smokeless tobacco: Never  Vaping Use   Vaping status: Never Used  Substance and Sexual Activity   Alcohol use: Yes    Comment: occasionally   Drug use: No   Sexual activity: Yes    Birth control/protection: Surgical  Other Topics Concern   Not on file  Social History Narrative   Not on file   Social Drivers of Health   Financial Resource Strain: Not on file  Food Insecurity: Not on file  Transportation Needs: No Transportation Needs (12/10/2021)   PRAPARE - Transportation    Lack of Transportation (Medical): No    Lack of Transportation (Non-Medical): No  Physical  Activity: Not on file  Stress: Not on file  Social Connections: Not on file  Intimate Partner Violence: Not At Risk (12/10/2021)   Humiliation, Afraid, Rape, and Kick questionnaire    Fear of Current or Ex-Partner: No    Emotionally Abused: No    Physically Abused: No    Sexually Abused: No    Past Surgical History:  Procedure Laterality Date   ABDOMINAL HYSTERECTOMY     ABLATION OF DYSRHYTHMIC FOCUS  01/16/2016   ATRIAL FIBRILLATION ABLATION N/A 05/24/2023   Procedure: ATRIAL FIBRILLATION ABLATION;  Surgeon: Lanier Prude, MD;  Location: [facility];  Service: Cardiovascular;  Laterality: N/A;   ELECTROPHYSIOLOGIC STUDY N/A 11/11/2015   Procedure: CARDIOVERSION;  Surgeon: Antonieta Iba, MD;  Location: [facility];  Service: Cardiovascular;  Laterality: N/A;   ELECTROPHYSIOLOGIC STUDY N/A 01/16/2016   Procedure: A-Flutter Ablation;  Surgeon: Duke Salvia, MD;  Location: [facility];  Service: Cardiovascular;  Laterality: N/A;   TOTAL HIP ARTHROPLASTY Right 12/10/2021   Procedure: TOTAL HIP ARTHROPLASTY ANTERIOR APPROACH;  Surgeon: Reinaldo Berber, MD;  Location: [facility];  Service: Orthopedics;  Laterality: Right;    Family History  Problem Relation Age of Onset   Hypertension Mother    Diabetes Mother    Diabetes Father    Diabetes Brother    Hypertension Brother    Hypertension Brother    Heart attack Brother    Diabetes Brother    Breast cancer Neg Hx     Allergies  Allergen Reactions   Lisinopril Swelling    Swelling of lips and face.    Tizanidine Other (See Comments)    bradycardia   Amoxicillin Itching and Rash   Latex Itching and Rash       Latest Ref Rng & Units 06/08/2023    5:32 AM 06/07/2023   12:13 AM 05/11/2023    8:34 AM  CBC  WBC 4.0 - 10.5 K/uL 6.3  9.9  8.0   Hemoglobin 12.0 - 15.0 g/dL 40.9  81.1  91.4   Hematocrit 36.0 - 46.0 % 35.0  36.3  44.8   Platelets 150 - 400 K/uL 216  225  224       CMP     Component Value  Date/Time   NA 141 06/08/2023 0532   NA 145 (H) 05/11/2023 0834   NA 142 01/06/2014 0038   K 3.4 (L) 06/08/2023 0532   K 3.9 01/06/2014 0038  CL 108 06/08/2023 0532   CL 108 (H) 01/06/2014 0038   CO2 27 06/08/2023 0532   CO2 27 01/06/2014 0038   GLUCOSE 93 06/08/2023 0532   GLUCOSE 96 01/06/2014 0038   BUN 10 06/08/2023 0532   BUN 16 05/11/2023 0834   BUN 10 01/06/2014 0038   CREATININE 0.82 06/08/2023 0532   CREATININE 0.64 01/06/2014 0038   CALCIUM 8.8 (L) 06/08/2023 0532   CALCIUM 8.5 01/06/2014 0038   PROT 7.4 06/07/2023 0013   PROT 7.8 07/14/2011 1311   ALBUMIN 4.4 06/07/2023 0013   ALBUMIN 4.3 07/14/2011 1311   AST 18 06/07/2023 0013   AST 18 07/14/2011 1311   ALT 13 06/07/2023 0013   ALT 13 07/14/2011 1311   ALKPHOS 56 06/07/2023 0013   ALKPHOS 63 07/14/2011 1311   BILITOT 0.3 06/07/2023 0013   BILITOT 0.5 07/14/2011 1311   EGFR 83 05/11/2023 0834   GFRNONAA >60 06/08/2023 0532   GFRNONAA >60 01/06/2014 0038   GFRNONAA >60 07/14/2011 1311     No results found.     Assessment & Plan:   1. 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  This represents a limb threatening ischemia and places the patient at the risk for right limb loss.  Patient should undergo angiography of the right lower extremity with the hope for intervention for limb salvage.  The risks and benefits as well as the alternative therapies was discussed in detail with the patient.  All questions were answered.  Patient agrees to proceed with right angiography.  The patient will follow up with me in the office after the procedure.   2. Essential hypertension Continue antihypertensive medications as already ordered, these medications have been reviewed and there are no changes at this time.   Current  Outpatient Medications on File Prior to Visit  Medication Sig Dispense Refill   acetaminophen (TYLENOL) 650 MG CR tablet Take 650-1,300 mg by mouth every 8 (eight) hours as needed for pain.     albuterol (VENTOLIN HFA) 108 (90 Base) MCG/ACT inhaler Inhale 1-2 puffs into the lungs every 4 (four) hours as needed for wheezing or shortness of breath. Inhale 2-4 puffs by mouth every 4 hours as needed for wheezing, cough, and/or shortness of breath 8 g 1   amLODipine (NORVASC) 10 MG tablet Take 1 tablet (10 mg total) by mouth daily. 30 tablet 11   apixaban (ELIQUIS) 5 MG TABS tablet Take 1 tablet (5 mg total) by mouth 2 (two) times daily. 60 tablet 1   ascorbic acid (VITAMIN C) 500 MG tablet Take 500 mg by mouth daily.     benzonatate (TESSALON) 200 MG capsule Take 1 capsule (200 mg total) by mouth 3 (three) times daily as needed for cough. 20 capsule 0   cholecalciferol (VITAMIN D) 1000 units tablet Take 1,000 Units by mouth daily.     clindamycin (CLEOCIN) 300 MG capsule Take 300 mg by mouth 3 (three) times daily.     fluticasone furoate-vilanterol (BREO ELLIPTA) 100-25 MCG/ACT AEPB Inhale 1 puff into the lungs daily. 30 each 2   levothyroxine (SYNTHROID) 100 MCG tablet Take 100 mcg by mouth daily before breakfast.     lidocaine (XYLOCAINE) 5 % ointment Apply 1 Application topically as needed. 35.44 g 0   loratadine (CLARITIN) 10 MG tablet Take 1 tablet (10 mg  total) by mouth every evening for 10 days. 10 tablet 0   losartan (COZAAR) 100 MG tablet Take 1 tablet (100 mg total) by mouth daily. 90 tablet 3   pantoprazole (PROTONIX) 40 MG tablet Take 1 tablet (40 mg total) by mouth daily. 45 tablet 0   No current facility-administered medications on file prior to visit.    There are no Patient Instructions on file for this visit. No follow-ups on file.   Georgiana Spinner, NP

## 2023-06-10 NOTE — H&P (View-Only) (Signed)
 Subjective:    Patient ID: [name], female    DOB: June 18, 1950, 73 y.o.   MRN: [id number] Chief Complaint  Patient presents with   New Patient (Initial Visit)    Ref Lucretia Roers consult bilateral leg pain    The patient is seen for evaluation of possible peripheral arterial disease given painful lower extremities after recently stubbing her toe.  The patient notes that she slipped and stubbed her toe and had significant pain.  She saw her podiatrist and there was significant concern that she may have decreased perfusion.  This is after her toenail began to fall off and there was concern about being able to heal if they were able to remove the toenail.  The patient denies classic claudication-like symptoms but she does have some pain with walking in her right leg.  She attributed this usually to her arthritis.  She has a former smoker.  The patient denies amaurosis fugax or recent TIA symptoms. There are no recent neurological changes noted. The patient denies history of DVT, PE or superficial thrombophlebitis. The patient denies recent episodes of angina or shortness of breath.   Today her ABIs are R=0.48 and L=0.78 TBi R=0.08 L=0.42 and the patient has monophasic tibial artery waveforms bilaterally.    Review of Systems  Musculoskeletal:  Positive for arthralgias.  Skin:  Positive for wound.  All other systems reviewed and are negative.      Objective:   Physical Exam Vitals reviewed.  HENT:     Head: Normocephalic.  Cardiovascular:     Rate and Rhythm: Normal rate.     Pulses:          Dorsalis pedis pulses are detected w/ Doppler on the right side and detected w/ Doppler on the left side.       Posterior tibial pulses are detected w/ Doppler on the right side and detected w/ Doppler on the left side.  Pulmonary:     Effort: Pulmonary effort is normal.  Skin:    General: Skin is warm and dry.  Neurological:     Mental Status: She is alert and oriented to person, place,  and time.  Psychiatric:        Mood and Affect: Mood normal.        Behavior: Behavior normal.        Thought Content: Thought content normal.        Judgment: Judgment normal.     BP (!) 161/74   Pulse (!) 55   Resp 18   Wt 175 lb 9.6 oz (79.7 kg)   BMI 29.22 kg/m   Past Medical History:  Diagnosis Date   Anemia    Arthritis    Atrial flutter (HCC) 09/2015   a.) CHADS2VASc 2 (sex, HTN); b.) s/p DCCV 11/11/2015 (150 J); c.) s/p RFCA 01/17/2016; d.) rate/rhythm maintained without pharmacological intervention; no chronic anticoagulation.   Coronary artery calcification seen on CT scan    Diastolic dysfunction 08/23/2019   a.)  TTE 08/23/2019: EF 55-60%, severe LAE, mild-mod MR, PASP 46.2, G2DD   Dyspnea    Essential hypertension    GERD (gastroesophageal reflux disease)    Hepatic steatosis    Incomplete left bundle branch block (LBBB)    Pneumonia    as a child   Subclinical hypothyroidism    Tobacco abuse     Social History   Socioeconomic History   Marital status: Widowed    Spouse name: Not on file   Number  of children: Not on file   Years of education: Not on file   Highest education level: Not on file  Occupational History   Not on file  Tobacco Use   Smoking status: Former    Current packs/day: 0.00    Types: Cigarettes    Quit date: 11/19/2015    Years since quitting: 7.5   Smokeless tobacco: Never  Vaping Use   Vaping status: Never Used  Substance and Sexual Activity   Alcohol use: Yes    Comment: occasionally   Drug use: No   Sexual activity: Yes    Birth control/protection: Surgical  Other Topics Concern   Not on file  Social History Narrative   Not on file   Social Drivers of Health   Financial Resource Strain: Not on file  Food Insecurity: Not on file  Transportation Needs: No Transportation Needs (12/10/2021)   PRAPARE - Transportation    Lack of Transportation (Medical): No    Lack of Transportation (Non-Medical): No  Physical  Activity: Not on file  Stress: Not on file  Social Connections: Not on file  Intimate Partner Violence: Not At Risk (12/10/2021)   Humiliation, Afraid, Rape, and Kick questionnaire    Fear of Current or Ex-Partner: No    Emotionally Abused: No    Physically Abused: No    Sexually Abused: No    Past Surgical History:  Procedure Laterality Date   ABDOMINAL HYSTERECTOMY     ABLATION OF DYSRHYTHMIC FOCUS  01/16/2016   ATRIAL FIBRILLATION ABLATION N/A 05/24/2023   Procedure: ATRIAL FIBRILLATION ABLATION;  Surgeon: Lanier Prude, MD;  Location: [facility];  Service: Cardiovascular;  Laterality: N/A;   ELECTROPHYSIOLOGIC STUDY N/A 11/11/2015   Procedure: CARDIOVERSION;  Surgeon: Antonieta Iba, MD;  Location: [facility];  Service: Cardiovascular;  Laterality: N/A;   ELECTROPHYSIOLOGIC STUDY N/A 01/16/2016   Procedure: A-Flutter Ablation;  Surgeon: Duke Salvia, MD;  Location: [facility] IN[facility];  Service: Cardiovascular;  Laterality: N/A;   TOTAL HIP ARTHROPLASTY Right 12/10/2021   Procedure: TOTAL HIP ARTHROPLASTY ANTERIOR APPROACH;  Surgeon: Reinaldo Berber, MD;  Location: [facility];  Service: Orthopedics;  Laterality: Right;    Family History  Problem Relation Age of Onset   Hypertension Mother    Diabetes Mother    Diabetes Father    Diabetes Brother    Hypertension Brother    Hypertension Brother    Heart attack Brother    Diabetes Brother    Breast cancer Neg Hx     Allergies  Allergen Reactions   Lisinopril Swelling    Swelling of lips and face.    Tizanidine Other (See Comments)    bradycardia   Amoxicillin Itching and Rash   Latex Itching and Rash       Latest Ref Rng & Units 06/08/2023    5:32 AM 06/07/2023   12:13 AM 05/11/2023    8:34 AM  CBC  WBC 4.0 - 10.5 K/uL 6.3  9.9  8.0   Hemoglobin 12.0 - 15.0 g/dL 40.9  81.1  91.4   Hematocrit 36.0 - 46.0 % 35.0  36.3  44.8   Platelets 150 - 400 K/uL 216  225  224       CMP     Component Value  Date/Time   NA 141 06/08/2023 0532   NA 145 (H) 05/11/2023 0834   NA 142 01/06/2014 0038   K 3.4 (L) 06/08/2023 0532   K 3.9 01/06/2014 0038  CL 108 06/08/2023 0532   CL 108 (H) 01/06/2014 0038   CO2 27 06/08/2023 0532   CO2 27 01/06/2014 0038   GLUCOSE 93 06/08/2023 0532   GLUCOSE 96 01/06/2014 0038   BUN 10 06/08/2023 0532   BUN 16 05/11/2023 0834   BUN 10 01/06/2014 0038   CREATININE 0.82 06/08/2023 0532   CREATININE 0.64 01/06/2014 0038   CALCIUM 8.8 (L) 06/08/2023 0532   CALCIUM 8.5 01/06/2014 0038   PROT 7.4 06/07/2023 0013   PROT 7.8 07/14/2011 1311   ALBUMIN 4.4 06/07/2023 0013   ALBUMIN 4.3 07/14/2011 1311   AST 18 06/07/2023 0013   AST 18 07/14/2011 1311   ALT 13 06/07/2023 0013   ALT 13 07/14/2011 1311   ALKPHOS 56 06/07/2023 0013   ALKPHOS 63 07/14/2011 1311   BILITOT 0.3 06/07/2023 0013   BILITOT 0.5 07/14/2011 1311   EGFR 83 05/11/2023 0834   GFRNONAA >60 06/08/2023 0532   GFRNONAA >60 01/06/2014 0038   GFRNONAA >60 07/14/2011 1311     No results found.     Assessment & Plan:   1. 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  This represents a limb threatening ischemia and places the patient at the risk for right limb loss.  Patient should undergo angiography of the right lower extremity with the hope for intervention for limb salvage.  The risks and benefits as well as the alternative therapies was discussed in detail with the patient.  All questions were answered.  Patient agrees to proceed with right angiography.  The patient will follow up with me in the office after the procedure.   2. Essential hypertension Continue antihypertensive medications as already ordered, these medications have been reviewed and there are no changes at this time.   Current  Outpatient Medications on File Prior to Visit  Medication Sig Dispense Refill   acetaminophen (TYLENOL) 650 MG CR tablet Take 650-1,300 mg by mouth every 8 (eight) hours as needed for pain.     albuterol (VENTOLIN HFA) 108 (90 Base) MCG/ACT inhaler Inhale 1-2 puffs into the lungs every 4 (four) hours as needed for wheezing or shortness of breath. Inhale 2-4 puffs by mouth every 4 hours as needed for wheezing, cough, and/or shortness of breath 8 g 1   amLODipine (NORVASC) 10 MG tablet Take 1 tablet (10 mg total) by mouth daily. 30 tablet 11   apixaban (ELIQUIS) 5 MG TABS tablet Take 1 tablet (5 mg total) by mouth 2 (two) times daily. 60 tablet 1   ascorbic acid (VITAMIN C) 500 MG tablet Take 500 mg by mouth daily.     benzonatate (TESSALON) 200 MG capsule Take 1 capsule (200 mg total) by mouth 3 (three) times daily as needed for cough. 20 capsule 0   cholecalciferol (VITAMIN D) 1000 units tablet Take 1,000 Units by mouth daily.     clindamycin (CLEOCIN) 300 MG capsule Take 300 mg by mouth 3 (three) times daily.     fluticasone furoate-vilanterol (BREO ELLIPTA) 100-25 MCG/ACT AEPB Inhale 1 puff into the lungs daily. 30 each 2   levothyroxine (SYNTHROID) 100 MCG tablet Take 100 mcg by mouth daily before breakfast.     lidocaine (XYLOCAINE) 5 % ointment Apply 1 Application topically as needed. 35.44 g 0   loratadine (CLARITIN) 10 MG tablet Take 1 tablet (10 mg  total) by mouth every evening for 10 days. 10 tablet 0   losartan (COZAAR) 100 MG tablet Take 1 tablet (100 mg total) by mouth daily. 90 tablet 3   pantoprazole (PROTONIX) 40 MG tablet Take 1 tablet (40 mg total) by mouth daily. 45 tablet 0   No current facility-administered medications on file prior to visit.    There are no Patient Instructions on file for this visit. No follow-ups on file.   Georgiana Spinner, NP

## 2023-06-12 ENCOUNTER — Other Ambulatory Visit: Payer: Self-pay

## 2023-06-12 ENCOUNTER — Emergency Department
Admission: EM | Admit: 2023-06-12 | Discharge: 2023-06-12 | Disposition: A | Discharge status: Home / Self Care | Attending: Emergency Medicine | Admitting: Emergency Medicine

## 2023-06-12 DIAGNOSIS — E876 Hypokalemia: Secondary | ICD-10-CM | POA: Diagnosis not present

## 2023-06-12 DIAGNOSIS — R2243 Localized swelling, mass and lump, lower limb, bilateral: Secondary | ICD-10-CM | POA: Diagnosis present

## 2023-06-12 DIAGNOSIS — R600 Localized edema: Secondary | ICD-10-CM | POA: Diagnosis not present

## 2023-06-12 DIAGNOSIS — I10 Essential (primary) hypertension: Secondary | ICD-10-CM | POA: Insufficient documentation

## 2023-06-12 DIAGNOSIS — N390 Urinary tract infection, site not specified: Secondary | ICD-10-CM | POA: Diagnosis not present

## 2023-06-12 DIAGNOSIS — E02 Subclinical iodine-deficiency hypothyroidism: Secondary | ICD-10-CM | POA: Insufficient documentation

## 2023-06-12 DIAGNOSIS — Z79899 Other long term (current) drug therapy: Secondary | ICD-10-CM | POA: Insufficient documentation

## 2023-06-12 DIAGNOSIS — Z96641 Presence of right artificial hip joint: Secondary | ICD-10-CM | POA: Diagnosis not present

## 2023-06-12 DIAGNOSIS — Z7901 Long term (current) use of anticoagulants: Secondary | ICD-10-CM | POA: Diagnosis not present

## 2023-06-12 LAB — CBC
HCT: 36.5 % (ref 36.0–46.0)
Hemoglobin: 11.3 g/dL — ABNORMAL LOW (ref 12.0–15.0)
MCH: 24.1 pg — ABNORMAL LOW (ref 26.0–34.0)
MCHC: 31 g/dL (ref 30.0–36.0)
MCV: 77.8 fL — ABNORMAL LOW (ref 80.0–100.0)
Platelets: 227 10*3/uL (ref 150–400)
RBC: 4.69 MIL/uL (ref 3.87–5.11)
RDW: 15.3 % (ref 11.5–15.5)
WBC: 7.4 10*3/uL (ref 4.0–10.5)
nRBC: 0 % (ref 0.0–0.2)

## 2023-06-12 LAB — BASIC METABOLIC PANEL WITH GFR
Anion gap: 9 (ref 5–15)
BUN: 10 mg/dL (ref 8–23)
CO2: 29 mmol/L (ref 22–32)
Calcium: 9.3 mg/dL (ref 8.9–10.3)
Chloride: 105 mmol/L (ref 98–111)
Creatinine, Ser: 0.89 mg/dL (ref 0.44–1.00)
GFR, Estimated: >60 mL/min (ref >60)
Glucose, Bld: 91 mg/dL (ref 70–99)
Potassium: 3 mmol/L — ABNORMAL LOW (ref 3.5–5.1)
Sodium: 143 mmol/L (ref 135–145)

## 2023-06-12 LAB — URINALYSIS, ROUTINE W REFLEX MICROSCOPIC
Bilirubin Urine: NEGATIVE
Glucose, UA: NEGATIVE mg/dL
Ketones, ur: NEGATIVE mg/dL
Nitrite: NEGATIVE
Protein, ur: NEGATIVE mg/dL
Specific Gravity, Urine: 1.01 (ref 1.005–1.030)
pH: 5 (ref 5.0–8.0)

## 2023-06-12 LAB — BRAIN NATRIURETIC PEPTIDE: B Natriuretic Peptide: 177.4 pg/mL — ABNORMAL HIGH (ref 0.0–100.0)

## 2023-06-12 MED ORDER — CEPHALEXIN 500 MG PO CAPS
500.0000 mg | ORAL_CAPSULE | Freq: Once | ORAL | Status: AC
  Administered 2023-06-12: 500 mg via ORAL
  Filled 2023-06-12: qty 1

## 2023-06-12 MED ORDER — CEPHALEXIN 500 MG PO CAPS: 500.0000 mg | ORAL_CAPSULE | Freq: Two times a day (BID) | ORAL | 0 refills | Status: DC

## 2023-06-12 MED ORDER — POTASSIUM CHLORIDE CRYS ER 20 MEQ PO TBCR
40.0000 meq | EXTENDED_RELEASE_TABLET | Freq: Once | ORAL | Status: AC
  Administered 2023-06-12: 40 meq via ORAL
  Filled 2023-06-12: qty 2

## 2023-06-12 NOTE — ED Triage Notes (Addendum)
 Pt to ED via pOV c/o bilateral foot/ankle swelling. Pt noticed it started suddenly tonight. Reports hx of HTN, takes meds as prescribed. Denies CP, SHOB, dizziness fevers

## 2023-06-12 NOTE — ED Provider Notes (Addendum)
 [facility] Provider Note    Event Date/Time   First MD Initiated Contact with Patient 06/12/23 [phone]     (approximate)   History   Leg Swelling   HPI  [name] is a 73 y.o. female with history of atrial flutter on Eliquis, hypertension, diastolic dysfunction who presents to the emergency department with bilateral lower extremity swelling in her feet and ankles.  Symptoms have improved significantly.  She denies any chest pain, shortness of breath.  She is on amlodipine.  She is not on a diuretic.  No history of PE or DVT.  She also reports she does not feel like she has been urinating as much today.  States her urine has been dark.  No dysuria or hematuria.  No history of renal dysfunction.  She feels like she is emptying her bladder and not having any abdominal discomfort.   History provided by patient, husband.    Past Medical History:  Diagnosis Date   Anemia    Arthritis    Atrial flutter (HCC) 09/2015   a.) CHADS2VASc 2 (sex, HTN); b.) s/p DCCV 11/11/2015 (150 J); c.) s/p RFCA 01/17/2016; d.) rate/rhythm maintained without pharmacological intervention; no chronic anticoagulation.   Coronary artery calcification seen on CT scan    Diastolic dysfunction 08/23/2019   a.)  TTE 08/23/2019: EF 55-60%, severe LAE, mild-mod MR, PASP 46.2, G2DD   Dyspnea    Essential hypertension    GERD (gastroesophageal reflux disease)    Hepatic steatosis    Incomplete left bundle branch block (LBBB)    Pneumonia    as a child   Subclinical hypothyroidism    Tobacco abuse     Past Surgical History:  Procedure Laterality Date   ABDOMINAL HYSTERECTOMY     ABLATION OF DYSRHYTHMIC FOCUS  01/16/2016   ATRIAL FIBRILLATION ABLATION N/A 05/24/2023   Procedure: ATRIAL FIBRILLATION ABLATION;  Surgeon: Pamela Prude, MD;  Location: [facility] INVASIVE [facility];  Service: Cardiovascular;  Laterality: N/A;   ELECTROPHYSIOLOGIC STUDY N/A 11/11/2015   Procedure:  CARDIOVERSION;  Surgeon: Pamela Iba, MD;  Location: [facility] ORS;  Service: Cardiovascular;  Laterality: N/A;   ELECTROPHYSIOLOGIC STUDY N/A 01/16/2016   Procedure: A-Flutter Ablation;  Surgeon: Pamela Salvia, MD;  Location: [facility] INVASIVE CV LAB;  Service: Cardiovascular;  Laterality: N/A;   TOTAL HIP ARTHROPLASTY Right 12/10/2021   Procedure: TOTAL HIP ARTHROPLASTY ANTERIOR APPROACH;  Surgeon: Pamela Berber, MD;  Location: [facility] ORS;  Service: Orthopedics;  Laterality: Right;    MEDICATIONS:  Prior to Admission medications   Medication Sig Start Date End Date Taking? Authorizing Provider  acetaminophen (TYLENOL) 650 MG CR tablet Take 650-1,300 mg by mouth every 8 (eight) hours as needed for pain.    [provider]  albuterol (VENTOLIN HFA) 108 (90 Base) MCG/ACT inhaler Inhale 1-2 puffs into the lungs every 4 (four) hours as needed for wheezing or shortness of breath. Inhale 2-4 puffs by mouth every 4 hours as needed for wheezing, cough, and/or shortness of breath 06/08/23   Pamela Santa, MD  amLODipine (NORVASC) 10 MG tablet Take 1 tablet (10 mg total) by mouth daily. 03/24/18 09/04/28  Cuthriell, Delorise Royals, PA-C  apixaban (ELIQUIS) 5 MG TABS tablet Take 1 tablet (5 mg total) by mouth 2 (two) times daily. 04/01/23 06/10/23  Pamela Semen, MD  ascorbic acid (VITAMIN C) 500 MG tablet Take 500 mg by mouth daily.    [provider]  benzonatate (TESSALON) 200 MG capsule Take  1 capsule (200 mg total) by mouth 3 (three) times daily as needed for cough. 06/08/23   Pamela Santa, MD  cholecalciferol (VITAMIN D) 1000 units tablet Take 1,000 Units by mouth daily.    [provider]  clindamycin (CLEOCIN) 300 MG capsule Take 300 mg by mouth 3 (three) times daily. 05/30/23   [provider]  fluticasone furoate-vilanterol (BREO ELLIPTA) 100-25 MCG/ACT AEPB Inhale 1 puff into the lungs daily. 06/08/23 09/06/23  Pamela Santa, MD  levothyroxine (SYNTHROID) 100 MCG tablet Take  100 mcg by mouth daily before breakfast.    [provider]  lidocaine (XYLOCAINE) 5 % ointment Apply 1 Application topically as needed. 05/17/23   Candelaria Stagers, DPM  loratadine (CLARITIN) 10 MG tablet Take 1 tablet (10 mg total) by mouth every evening for 10 days. 06/08/23 06/18/23  Pamela Santa, MD  losartan (COZAAR) 100 MG tablet Take 1 tablet (100 mg total) by mouth daily. 09/23/22   Furth, Cadence H, PA-C  pantoprazole (PROTONIX) 40 MG tablet Take 1 tablet (40 mg total) by mouth daily. 05/24/23 07/08/23  Pamela Don, NP    Physical Exam   Triage Vital Signs: ED Triage Vitals  Encounter Vitals Group     BP 06/12/23 0005 (!) 178/83     Systolic BP Percentile --      Diastolic BP Percentile --      Pulse Rate 06/12/23 0005 61     Resp 06/12/23 0005 16     Temp 06/12/23 0005 97.9 F (36.6 C)     Temp Source 06/12/23 0006 Oral     SpO2 06/12/23 0005 100 %     Weight --      Height --      Head Circumference --      Peak Flow --      Pain Score 06/12/23 0005 0     Pain Loc --      Pain Education --      Exclude from Growth Chart --     Most recent vital signs: Vitals:   06/12/23 0005 06/12/23 0006  BP: (!) 178/83 (!) 178/83  Pulse: 61 61  Resp: 16 18  Temp: 97.9 F (36.6 C) 97.9 F (36.6 C)  SpO2: 100% 100%    CONSTITUTIONAL: Alert, responds appropriately to questions. Well-appearing; well-nourished HEAD: Normocephalic, atraumatic EYES: Conjunctivae clear, pupils appear equal, sclera nonicteric ENT: normal nose; moist mucous membranes NECK: Supple, normal ROM CARD: RRR; S1 and S2 appreciated RESP: Normal chest excursion without splinting or tachypnea; breath sounds clear and equal bilaterally; no wheezes, no rhonchi, no rales, no hypoxia or respiratory distress, speaking full sentences ABD/GI: Non-distended; soft, non-tender, no rebound, no guarding, no peritoneal signs BACK: The back appears normal EXT: Normal ROM in all joints; no deformity noted, trace  symmetric edema in bilateral dorsal feet and ankles.  +1 palpable DP pulses, extremities warm and well-perfused, normal capillary refill.  No calf tenderness or calf swelling.  Compartments soft. SKIN: Normal color for age and race; warm; no rash on exposed skin NEURO: Moves all extremities equally, normal speech PSYCH: The patient's mood and manner are appropriate.   ED Results / Procedures / Treatments   LABS: (all labs ordered are listed, but only abnormal results are displayed) Labs Reviewed  BRAIN NATRIURETIC PEPTIDE - Abnormal; Notable for the following components:      Result Value   B Natriuretic Peptide 177.4 (*)    All other components within normal limits  CBC - Abnormal;  Notable for the following components:   Hemoglobin 11.3 (*)    MCV 77.8 (*)    MCH 24.1 (*)    All other components within normal limits  BASIC METABOLIC PANEL WITH GFR - Abnormal; Notable for the following components:   Potassium 3.0 (*)    All other components within normal limits  URINALYSIS, ROUTINE W REFLEX MICROSCOPIC - Abnormal; Notable for the following components:   Color, Urine STRAW (*)    APPearance HAZY (*)    Hgb urine dipstick LARGE (*)    Leukocytes,Ua LARGE (*)    Bacteria, UA RARE (*)    All other components within normal limits  URINE CULTURE     EKG:  EKG Interpretation Date/Time:  Sunday June 12 2023 00:12:10 EDT Ventricular Rate:  53 PR Interval:  194 QRS Duration:  110 QT Interval:  394 QTC Calculation: 369 R Axis:   80  Text Interpretation: Sinus bradycardia Anterior infarct (cited on or before 01-Apr-2023) Abnormal ECG When compared with ECG of 07-Jun-2023 00:03, No significant change was found Confirmed by Rochele Raring [phone]) on 06/12/2023 2:59:27 AM         RADIOLOGY: My personal review and interpretation of imaging:    I have personally reviewed all radiology reports.   No results found.   PROCEDURES:  Critical Care performed:  No   Procedures    IMPRESSION / MDM / ASSESSMENT AND PLAN / ED COURSE  I reviewed the triage vital signs and the nursing notes.    Patient here with bilateral lower extremity swelling that has improved.  No chest pain or shortness of breath.  Also complaining of not urinating frequently and urine appeared dark.  The patient is on the cardiac monitor to evaluate for evidence of arrhythmia and/or significant heart rate changes.   DIFFERENTIAL DIAGNOSIS (includes but not limited to):   CHF exacerbation, peripheral vascular disease, side effect of calcium channel blocker, doubt DVT, acute arterial obstruction, no signs of compartment syndrome, gout, septic arthritis or cellulitis on exam.  Doubt fracture.   Patient's presentation is most consistent with acute complicated illness / injury requiring diagnostic workup.   PLAN: Patient here with peripheral edema that appears to have improved and is only very mild at this time.  BNP is 177.  He does have a history of diastolic heart failure.  She is not on a diuretic.  No chest pain or shortness of breath.  EKG shows no new changes.  I do not feel that diuretics are indicated today specially because she has intermittent episodes of hypokalemia and potassium today is 3.0.  We discussed that diuretics would worsen this.  Will give oral replacement today.  Recommended compression stockings, keeping her legs elevated, decreasing the sodium in her diet.  She does report feeling like she is not urinating as much but does not appear dehydrated today.  Her renal function is normal.  Will check urinalysis.  She feels like she is emptying her bladder and has a nondistended, nontender abdomen.  Low suspicion for retention.   MEDICATIONS GIVEN IN ED: Medications  potassium chloride SA (KLOR-CON M) CR tablet 40 mEq (has no administration in time range)  cephALEXin (KEFLEX) capsule 500 mg (has no administration in time range)     ED COURSE: Patient does  appear to have a UTI.  Will send urine culture and start on Keflex.  Will have her follow-up closely with her PCP for recheck.   At this time, I do not feel  there is any life-threatening condition present. I reviewed all nursing notes, vitals, pertinent previous records.  All lab and urine results, EKGs, imaging ordered have been independently reviewed and interpreted by myself.  I reviewed all available radiology reports from any imaging ordered this visit.  Based on my assessment, I feel the patient is safe to be discharged home without further emergent workup and can continue workup as an outpatient as needed. Discussed all findings, treatment plan as well as usual and customary return precautions.  They verbalize understanding and are comfortable with this plan.  Outpatient follow-up has been provided as needed.  All questions have been answered.    CONSULTS:  none   OUTSIDE RECORDS REVIEWED: Reviewed orthopedic note in November 2024.       FINAL CLINICAL IMPRESSION(S) / ED DIAGNOSES   Final diagnoses:  Peripheral edema  Acute UTI     Rx / DC Orders   ED Discharge Orders          Ordered    cephALEXin (KEFLEX) 500 MG capsule  2 times daily        06/12/23 4098             Note:  This document was prepared using Dragon voice recognition software and may include unintentional dictation errors.   Jrue Jarriel, Layla Maw, DO 06/12/23 0302    Analeese Andreatta, Layla Maw, DO 06/12/23 [phone]

## 2023-06-12 NOTE — Discharge Instructions (Addendum)
 I recommend keeping your legs elevated when at rest.  I recommend purchasing TED hose or compression socks/stockings to help with swelling.  Amlodipine can cause swelling in the legs.  Please do not stop this medication but if you do not feel like your symptoms are improving, follow-up with your primary care doctor.  You do appear to have a urinary tract infection today.  Please take your antibiotics until complete.  If you develop chest pain, shortness of breath, increased swelling in your legs please return to the emergency department.

## 2023-06-13 ENCOUNTER — Other Ambulatory Visit: Payer: Self-pay

## 2023-06-13 ENCOUNTER — Telehealth: Payer: Self-pay | Admitting: Cardiology

## 2023-06-13 LAB — URINE CULTURE

## 2023-06-13 LAB — VAS US ABI WITH/WO TBI
Left ABI: 0.78
Right ABI: 0.42

## 2023-06-13 MED ORDER — APIXABAN 5 MG PO TABS: 5.0000 mg | ORAL_TABLET | Freq: Two times a day (BID) | ORAL | 1 refills | Status: DC

## 2023-06-13 NOTE — Telephone Encounter (Signed)
*  STAT* If patient is at the pharmacy, call can be transferred to refill team.   1. Which medications need to be refilled? (please list name of each medication and dose if known)  apixaban (ELIQUIS) 5 MG TABS tablet (Expired)  2. Which pharmacy/location (including street and city if local pharmacy) is medication to be sent to? [id number] [place], [place] - [address] Phone: [phone]  Fax: [phone]     3. Do they need a 30 day or 90 day supply? 90

## 2023-06-13 NOTE — Telephone Encounter (Signed)
 Prescription refill request for Eliquis received. Indication:afib Last office visit:2/25 Scr:0.89  3/25 Age: 73 Weight:79.7  kg  Prescription refilled

## 2023-06-14 ENCOUNTER — Other Ambulatory Visit: Payer: Self-pay | Admitting: Podiatry

## 2023-06-14 DIAGNOSIS — I999 Unspecified disorder of circulatory system: Secondary | ICD-10-CM

## 2023-06-15 ENCOUNTER — Telehealth (INDEPENDENT_AMBULATORY_CARE_PROVIDER_SITE_OTHER): Payer: Self-pay

## 2023-06-15 NOTE — Telephone Encounter (Signed)
 Spoke with the patient and she is scheduled with Dr. Wyn Quaker on 06/20/23 for a RLE angio with a 11:00 am arrival time to the [facility]. Pre-procedure instructions were discussed and will be sent to Mychart and mailed.

## 2023-06-16 ENCOUNTER — Other Ambulatory Visit: Payer: Self-pay | Admitting: Nurse Practitioner

## 2023-06-16 ENCOUNTER — Other Ambulatory Visit: Payer: Self-pay

## 2023-06-16 DIAGNOSIS — I999 Unspecified disorder of circulatory system: Secondary | ICD-10-CM

## 2023-06-16 DIAGNOSIS — K769 Liver disease, unspecified: Secondary | ICD-10-CM

## 2023-06-20 ENCOUNTER — Encounter: Payer: Self-pay | Admitting: Vascular Surgery

## 2023-06-20 ENCOUNTER — Ambulatory Visit
Admission: RE | Admit: 2023-06-20 | Discharge: 2023-06-20 | Disposition: A | Discharge status: Home / Self Care | Attending: Vascular Surgery | Admitting: Vascular Surgery

## 2023-06-20 ENCOUNTER — Other Ambulatory Visit: Payer: Self-pay

## 2023-06-20 ENCOUNTER — Encounter
Admission: RE | Disposition: A | Discharge status: Home / Self Care | Payer: Self-pay | Source: Home / Self Care | Attending: Vascular Surgery

## 2023-06-20 DIAGNOSIS — I70235 Atherosclerosis of native arteries of right leg with ulceration of other part of foot: Secondary | ICD-10-CM | POA: Diagnosis not present

## 2023-06-20 DIAGNOSIS — L97519 Non-pressure chronic ulcer of other part of right foot with unspecified severity: Secondary | ICD-10-CM | POA: Diagnosis not present

## 2023-06-20 DIAGNOSIS — Z8249 Family history of ischemic heart disease and other diseases of the circulatory system: Secondary | ICD-10-CM | POA: Diagnosis not present

## 2023-06-20 DIAGNOSIS — M1990 Unspecified osteoarthritis, unspecified site: Secondary | ICD-10-CM | POA: Diagnosis not present

## 2023-06-20 DIAGNOSIS — I70299 Other atherosclerosis of native arteries of extremities, unspecified extremity: Secondary | ICD-10-CM

## 2023-06-20 DIAGNOSIS — Z79899 Other long term (current) drug therapy: Secondary | ICD-10-CM | POA: Insufficient documentation

## 2023-06-20 DIAGNOSIS — I743 Embolism and thrombosis of arteries of the lower extremities: Secondary | ICD-10-CM | POA: Diagnosis not present

## 2023-06-20 DIAGNOSIS — I10 Essential (primary) hypertension: Secondary | ICD-10-CM | POA: Insufficient documentation

## 2023-06-20 DIAGNOSIS — Z87891 Personal history of nicotine dependence: Secondary | ICD-10-CM | POA: Insufficient documentation

## 2023-06-20 HISTORY — PX: LOWER EXTREMITY ANGIOGRAPHY: CATH118251

## 2023-06-20 LAB — CREATININE, SERUM
Creatinine, Ser: 0.83 mg/dL (ref 0.44–1.00)
GFR, Estimated: >60 mL/min (ref >60)

## 2023-06-20 LAB — BUN: BUN: 18 mg/dL (ref 8–23)

## 2023-06-20 SURGERY — LOWER EXTREMITY ANGIOGRAPHY
Anesthesia: Moderate Sedation | Site: Leg Lower | Laterality: Right

## 2023-06-20 MED ORDER — SODIUM CHLORIDE 0.9 % IV SOLN: INTRAVENOUS | Status: DC

## 2023-06-20 MED ORDER — METHYLPREDNISOLONE SODIUM SUCC 125 MG IJ SOLR: 125.0000 mg | Freq: Once | INTRAMUSCULAR | Status: DC | PRN

## 2023-06-20 MED ORDER — SODIUM CHLORIDE 0.9% FLUSH: 3.0000 mL | INTRAVENOUS | Status: DC | PRN

## 2023-06-20 MED ORDER — LIDOCAINE-EPINEPHRINE (PF) 1 %-1:200000 IJ SOLN
INTRAMUSCULAR | Status: DC | PRN
  Administered 2023-06-20: 10 mL via INTRADERMAL

## 2023-06-20 MED ORDER — CEFAZOLIN SODIUM-DEXTROSE 2-4 GM/100ML-% IV SOLN
INTRAVENOUS | Status: AC
  Filled 2023-06-20: qty 100

## 2023-06-20 MED ORDER — HEPARIN (PORCINE) IN NACL 2000-0.9 UNIT/L-% IV SOLN
INTRAVENOUS | Status: DC | PRN
  Administered 2023-06-20: 1000 mL

## 2023-06-20 MED ORDER — LABETALOL HCL 5 MG/ML IV SOLN: 10.0000 mg | INTRAVENOUS | Status: DC | PRN

## 2023-06-20 MED ORDER — IODIXANOL 320 MG/ML IV SOLN
INTRAVENOUS | Status: DC | PRN
  Administered 2023-06-20: 75 mL via INTRA_ARTERIAL

## 2023-06-20 MED ORDER — FENTANYL CITRATE (PF) 100 MCG/2ML IJ SOLN
INTRAMUSCULAR | Status: AC
  Filled 2023-06-20: qty 2

## 2023-06-20 MED ORDER — ATORVASTATIN CALCIUM 20 MG PO TABS: 10.0000 mg | ORAL_TABLET | Freq: Every day | ORAL | Status: DC

## 2023-06-20 MED ORDER — ACETAMINOPHEN 325 MG PO TABS: 650.0000 mg | ORAL_TABLET | ORAL | Status: DC | PRN

## 2023-06-20 MED ORDER — HYDROMORPHONE HCL 1 MG/ML IJ SOLN: 1.0000 mg | Freq: Once | INTRAMUSCULAR | Status: DC | PRN

## 2023-06-20 MED ORDER — ACETAMINOPHEN 500 MG PO TABS: 1000.0000 mg | ORAL_TABLET | Freq: Once | ORAL | Status: DC

## 2023-06-20 MED ORDER — ATORVASTATIN CALCIUM 10 MG PO TABS: 10.0000 mg | ORAL_TABLET | Freq: Every day | ORAL | 11 refills | Status: AC

## 2023-06-20 MED ORDER — HYDRALAZINE HCL 20 MG/ML IJ SOLN: 5.0000 mg | INTRAMUSCULAR | Status: DC | PRN

## 2023-06-20 MED ORDER — MIDAZOLAM HCL 5 MG/5ML IJ SOLN
INTRAMUSCULAR | Status: AC
  Filled 2023-06-20: qty 5

## 2023-06-20 MED ORDER — ASPIRIN 81 MG PO TBEC
81.0000 mg | DELAYED_RELEASE_TABLET | Freq: Every day | ORAL | Status: DC
  Administered 2023-06-20: 81 mg via ORAL

## 2023-06-20 MED ORDER — ONDANSETRON HCL 4 MG/2ML IJ SOLN: 4.0000 mg | Freq: Four times a day (QID) | INTRAMUSCULAR | Status: DC | PRN

## 2023-06-20 MED ORDER — SODIUM CHLORIDE 0.9% FLUSH: 3.0000 mL | Freq: Two times a day (BID) | INTRAVENOUS | Status: DC

## 2023-06-20 MED ORDER — CEFAZOLIN SODIUM-DEXTROSE 2-4 GM/100ML-% IV SOLN
2.0000 g | INTRAVENOUS | Status: AC
  Administered 2023-06-20: 2 g via INTRAVENOUS

## 2023-06-20 MED ORDER — FENTANYL CITRATE (PF) 100 MCG/2ML IJ SOLN
INTRAMUSCULAR | Status: DC | PRN
  Administered 2023-06-20: 50 ug via INTRAVENOUS
  Administered 2023-06-20 (×2): 25 ug via INTRAVENOUS

## 2023-06-20 MED ORDER — ASPIRIN 81 MG PO TBEC
DELAYED_RELEASE_TABLET | ORAL | Status: AC
Start: 2023-06-20 — End: ?
  Filled 2023-06-20: qty 1

## 2023-06-20 MED ORDER — MIDAZOLAM HCL 2 MG/ML PO SYRP
8.0000 mg | ORAL_SOLUTION | Freq: Once | ORAL | Status: DC | PRN
Start: 2023-06-20 — End: 2023-06-20

## 2023-06-20 MED ORDER — HEPARIN SODIUM (PORCINE) 1000 UNIT/ML IJ SOLN
INTRAMUSCULAR | Status: AC
  Filled 2023-06-20: qty 10

## 2023-06-20 MED ORDER — FAMOTIDINE 20 MG PO TABS: 40.0000 mg | ORAL_TABLET | Freq: Once | ORAL | Status: DC | PRN

## 2023-06-20 MED ORDER — DIPHENHYDRAMINE HCL 50 MG/ML IJ SOLN
50.0000 mg | Freq: Once | INTRAMUSCULAR | Status: DC | PRN
Start: 2023-06-20 — End: 2023-06-20

## 2023-06-20 MED ORDER — MIDAZOLAM HCL 2 MG/2ML IJ SOLN
INTRAMUSCULAR | Status: DC | PRN
  Administered 2023-06-20: 2 mg via INTRAVENOUS
  Administered 2023-06-20 (×2): 1 mg via INTRAVENOUS

## 2023-06-20 MED ORDER — HEPARIN SODIUM (PORCINE) 1000 UNIT/ML IJ SOLN
INTRAMUSCULAR | Status: DC | PRN
  Administered 2023-06-20: 5000 [IU] via INTRAVENOUS

## 2023-06-20 MED ORDER — SODIUM CHLORIDE 0.9 % IV SOLN: 250.0000 mL | INTRAVENOUS | Status: DC | PRN

## 2023-06-20 MED ORDER — ASPIRIN 81 MG PO TBEC: 81.0000 mg | DELAYED_RELEASE_TABLET | Freq: Every day | ORAL | 2 refills | Status: AC

## 2023-06-20 SURGICAL SUPPLY — 36 items
BALLN JADE .014 3.0 X 40 (BALLOONS) ×1 IMPLANT
BALLN LUTONIX 018 4X40X130 (BALLOONS) ×1 IMPLANT
BALLN LUTONIX 018 5X100X130 (BALLOONS) ×1 IMPLANT
BALLN LUTONIX 018 5X40X130 (BALLOONS) ×1 IMPLANT
BALLN LUTONIX 018 6X40X130 (BALLOONS) ×1 IMPLANT
BALLN LUTONIX 4X150X130 (BALLOONS) ×1 IMPLANT
BALLN ULTRVRSE 018 2.5X100X150 (BALLOONS) ×1 IMPLANT
BALLOON JADE .014 3.0 X 40 (BALLOONS) IMPLANT
BALLOON LUTONIX 018 4X40X130 (BALLOONS) IMPLANT
BALLOON LUTONIX 018 5X100X130 (BALLOONS) IMPLANT
BALLOON LUTONIX 018 5X40X130 (BALLOONS) IMPLANT
BALLOON LUTONIX 018 6X40X130 (BALLOONS) IMPLANT
BALLOON LUTONIX 4X150X130 (BALLOONS) IMPLANT
BALLOON ULTRVS 018 2.5X100X150 (BALLOONS) IMPLANT
CATH ANGIO 5F PIGTAIL 65CM (CATHETERS) IMPLANT
CATH BEACON 5 .038 100 VERT TP (CATHETERS) IMPLANT
CATH CXI SUPP ANG 4FR 135 (CATHETERS) IMPLANT
CATH ROTAREX 135 6FR (CATHETERS) IMPLANT
COVER PROBE ULTRASOUND 5X96 (MISCELLANEOUS) IMPLANT
DEVICE PRESTO INFLATION (MISCELLANEOUS) IMPLANT
DEVICE STARCLOSE SE CLOSURE (Vascular Products) IMPLANT
GLIDEWIRE ADV .035X260CM (WIRE) IMPLANT
PACK ANGIOGRAPHY (CUSTOM PROCEDURE TRAY) ×1 IMPLANT
SHEATH ANL2 6FRX45 HC (SHEATH) IMPLANT
SHEATH AVANTI 5FR X 11CM (SHEATH) IMPLANT
STENT ESPRIT BTK 3.0X28 SCAFF (Permanent Stent) IMPLANT
STENT ESPRIT BTK 3.0X38 SCAFF (Permanent Stent) IMPLANT
STENT LIFESTENT 5F 7X40X135 (Permanent Stent) IMPLANT
STENT VIABAHN 5X100X120 (Permanent Stent) IMPLANT
SYR MEDRAD MARK 7 150ML (SYRINGE) IMPLANT
SYS ESPRIT BTK 3.0X28 SCAFFOLD (Permanent Stent) ×1 IMPLANT
SYS ESPRIT BTK 3.0X38 SCAFFOLD (Permanent Stent) ×1 IMPLANT
TUBING CONTRAST HIGH PRESS 72 (TUBING) IMPLANT
WIRE COMMAND ST 018 300CM (WIRE) IMPLANT
WIRE J 3MM .035X145CM (WIRE) IMPLANT
WIRE RUNTHROUGH .014X300CM (WIRE) IMPLANT

## 2023-06-20 NOTE — Progress Notes (Signed)
 Dr. Wyn Quaker at bedside, speaking with pt. And her family re: procedural results. All parties involved verbalized understanding of conversation.

## 2023-06-20 NOTE — Progress Notes (Unsigned)
 This encounter was created in error - please disregard.

## 2023-06-20 NOTE — Op Note (Signed)
 [facility]  Percutaneous Study/Intervention Procedural Note   Date of Surgery: 06/20/2023  Surgeon(s):Tanis Hensarling    Assistants:none  Pre-operative Diagnosis: PAD with ulceration right lower extremity  Post-operative diagnosis:  Same  Procedure(s) Performed:             1.  Ultrasound guidance for vascular access left femoral artery             2.  Catheter placement into right common femoral artery from left femoral approach             3.  Aortogram and selective right lower extremity angiogram             4.  Percutaneous transluminal angioplasty of right proximal SFA with 5 mm diameter Lutonix drug-coated angioplasty balloon             5.  Mechanical thrombectomy of the right popliteal artery with the [place] device for thrombotic occlusion  6.  Angioplasty of the right popliteal artery with 4 mm diameter Lutonix drug-coated angioplasty balloon  7.  Stent placement to the right peroneal artery with 3 mm diameter by 38 mm length Esprit scaffolding  8.  Stent placement to the right tibioperoneal trunk for separate distinct lesion with 3 mm diameter by 28 mm length Esprit scaffolding  9.  Stent placement to the right popliteal artery with 5 mm diameter by 10 cm length Viabahn stent  10.  Stent placement to the proximal right SFA with 7 mm diameter by 4 cm length life stent             11.  StarClose closure device left femoral artery  EBL: 50 cc  Contrast: 75 cc  Fluoro Time: 12.3 minutes  Moderate Conscious Sedation Time: approximately 97 minutes using 4 mg of Versed and 100 mcg of Fentanyl              Indications:  Patient is a 73 y.o.female with an ulceration on the right foot as well as rest pain. The patient has noninvasive study showing markedly reduced ABIs on the right side. The patient is brought in for angiography for further evaluation and potential treatment.  Due to the limb threatening nature of the situation, angiogram was performed for  attempted limb salvage. The patient is aware that if the procedure fails, amputation would be expected.  The patient also understands that even with successful revascularization, amputation may still be required due to the severity of the situation.  Risks and benefits are discussed and informed consent is obtained.   Procedure:  The patient was identified and appropriate procedural time out was performed.  The patient was then placed supine on the table and prepped and draped in the usual sterile fashion. Moderate conscious sedation was administered during a face to face encounter with the patient throughout the procedure with my supervision of the RN administering medicines and monitoring the patient's vital signs, pulse oximetry, telemetry and mental status throughout from the start of the procedure until the patient was taken to the recovery room. Ultrasound was used to evaluate the left common femoral artery.  It was patent .  A digital ultrasound image was acquired.  A Seldinger needle was used to access the left common femoral artery under direct ultrasound guidance and a permanent image was performed.  A 0.035 J wire was advanced without resistance and a 5Fr sheath was placed.  Pigtail catheter was placed into the aorta and an AP aortogram was performed.  This demonstrated normal renal arteries and normal aorta and iliac segments without significant stenosis. I then crossed the aortic bifurcation and advanced to the right femoral head. Selective right lower extremity angiogram was then performed. This demonstrated a focal stenosis in the proximal SFA a few centimeters beyond the origin in the 60% range, there was then an occlusion in the popliteal artery that appeared to have thrombotic occlusion over a short segment.  The below-knee popliteal artery normalized.  There was then about a 70% stenosis in the tibioperoneal trunk before the vessel normalized and then the proximal to mid peroneal artery was an  area of about 80 to 85% stenosis.  The peroneal artery was the only significant runoff distally although the posterior tibial artery did reconstitute in the foot.  The anterior tibial artery was occluded throughout without distal reconstitution and the posterior tibial artery was occluded at its origin and only reconstituted in the foot. It was felt that it was in the patient's best interest to proceed with intervention after these images to avoid a second procedure and a larger amount of contrast and fluoroscopy based off of the findings from the initial angiogram. The patient was systemically heparinized and a 6 [place] Ansell sheath was then placed over the [employer] wire. I then used a Kumpe catheter and the advantage wire to navigate through the SFA stenosis and down across the popliteal occlusion without difficulty crossing the thrombotic occlusion and exchanging for a 0.014 wire.  I then used a CXI catheter and initially the command wire and then exchanged for a run-through wire going through the tibioperoneal trunk stenosis and the separate distinct peroneal artery stenosis.  The wire was then parked at the ankle.  Mechanical thrombectomy was performed initially in the popliteal artery for the thrombotic occlusion in the popliteal artery.  2 passes were made with the [place] Rex device which resulted in restoration of a channel of blood flow although significant stenosis remained.  The popliteal stenosis was treated with a 4 mm diameter by 15 cm length Lutonix drug-coated angioplasty balloon inflated to 10 atm for 1 minute.  There was still greater than 50% residual stenosis and after we addressed the tibial disease this was treated with a 5 mm diameter Viabahn stent that was 10 cm in length.  This was postdilated with a 5 mm balloon in the proximal segment and then a 4 mm balloon at the distal edge of the stent.  There was excellent angiographic completion result of the popliteal artery with less than  10% residual stenosis after stent placement.  To address the tibial disease, the peroneal artery was pretreated with a 2.5 mm diameter angioplasty balloon.  There remained greater than 50% residual stenosis and I elected to place the Esprit scaffold in the peroneal artery.  A 3 mm diameter by 38 mm length Esprit stent was placed in the proximal to mid peroneal artery to address the stenosis and inflated to 10 atm.  This was postdilated with a 3 mm diameter by 4 cm length Jade balloon with excellent angiographic completion result and less than 10% residual stenosis.  I then turned my attention to the tibioperoneal trunk lesion.  This was pretreated with a 3 mm diameter by 4 cm length angioplasty balloon but there remained greater than 50% residual stenosis after angioplasty and so I elected to place an additional Esprit scaffold system in the tibioperoneal trunk.  A 3 mm diameter by 28 mm length respiratory system was then placed in  the tibioperoneal trunk and postdilated with a 3 mm diameter noncompliant balloon with excellent angiographic completion result and less than 10% residual stenosis in the tibioperoneal trunk as well.  We then performed the popliteal stent as described above with good results.  The proximal SFA was then addressed initially with a 5 mm diameter by 4 cm length Lutonix drug-coated angioplasty balloon inflated to 12 atm for 1 minute.  50% residual stenosis remained and a 7 mm diameter by 4 cm length life stent was then deployed and postdilated with a 6 mm diameter Lutonix drug-coated balloon with excellent angiographic completion result and only about a 10% residual stenosis. I elected to terminate the procedure. The sheath was removed and StarClose closure device was deployed in the left femoral artery with excellent hemostatic result. The patient was taken to the recovery room in stable condition having tolerated the procedure well.  Findings:               Aortogram:  This demonstrated  normal renal arteries and normal aorta and iliac segments without significant stenosis             Right lower Extremity:  This demonstrated a focal stenosis in the proximal SFA a few centimeters beyond the origin in the 60% range, there was then an occlusion in the popliteal artery that appeared to have thrombotic occlusion over a short segment.  The below-knee popliteal artery normalized.  There was then about a 70% stenosis in the tibioperoneal trunk before the vessel normalized and then the proximal to mid peroneal artery was an area of about 80 to 85% stenosis.  The peroneal artery was the only significant runoff distally although the posterior tibial artery did reconstitute in the foot.  The anterior tibial artery was occluded throughout without distal reconstitution and the posterior tibial artery was occluded at its origin and only reconstituted in the foot.   Disposition: Patient was taken to the recovery room in stable condition having tolerated the procedure well.  Complications: None  Festus Barren 06/20/2023 3:29 PM   This note was created with Dragon Medical transcription system. Any errors in dictation are purely unintentional.

## 2023-06-20 NOTE — Interval H&P Note (Signed)
 History and Physical Interval Note:  06/20/2023 11:17 AM  [name]  has presented today for surgery, with the diagnosis of RLE Angio   ASO w ulceration.  The various methods of treatment have been discussed with the patient and family. After consideration of risks, benefits and other options for treatment, the patient has consented to  Procedure(s): Lower Extremity Angiography (Right) as a surgical intervention.  The patient's history has been reviewed, patient examined, no change in status, stable for surgery.  I have reviewed the patient's chart and labs.  Questions were answered to the patient's satisfaction.     Festus Barren

## 2023-06-21 ENCOUNTER — Telehealth (INDEPENDENT_AMBULATORY_CARE_PROVIDER_SITE_OTHER): Payer: Self-pay

## 2023-06-21 ENCOUNTER — Encounter: Payer: Self-pay | Admitting: Vascular Surgery

## 2023-06-21 ENCOUNTER — Ambulatory Visit: Admitting: Cardiology

## 2023-06-21 NOTE — Progress Notes (Unsigned)
 Electrophysiology Clinic Note    Date:  06/21/2023  Patient ID:  [name], [name] 27-Feb-1951, MRN [id number] PCP:  [facility]  Cardiologist:  Lorine Bears, MD Electrophysiologist: Lanier Prude, MD  ***refresh  Discussed the use of AI scribe software for clinical note transcription with the patient, who gave verbal consent to proceed.   Patient Profile    Chief Complaint: Ablation follow-up  History of Present Illness: [name] is a 73 y.o. female with PMH notable for typical atrial flutter, parox afib, bradycardia, HTN, hypothyroid; seen today for Lanier Prude, MD for routine electrophysiology followup.   She is s/p Aflutter ablation 2017 by Dr. Graciela Husbands. She is s/p AFib ablation w PVI, posterior wall on 05/24/2023 by Dr. Lalla Brothers.   She presented to ER with increased edema on 3/30. She was diagnosed with PAD with ulceration or R extremity and had angio yesterday with stents placed.   On follow-up today,  *** AF burden, symptoms *** palpitations *** bleeding concerns       Since last being seen in our clinic the patient reports doing ***.  she denies chest pain, palpitations, dyspnea, PND, orthopnea, nausea, vomiting, dizziness, syncope, edema, weight gain, or early satiety.      Arrhythmia/Device History No specialty comments available.      ROS:  Please see the history of present illness. All other systems are reviewed and otherwise negative.    Physical Exam    VS:  There were no vitals taken for this visit. BMI: There is no height or weight on file to calculate BMI.  Wt Readings from Last 3 Encounters:  06/20/23 168 lb 3.2 oz (76.3 kg)  06/10/23 175 lb 9.6 oz (79.7 kg)  06/07/23 173 lb (78.5 kg)     GEN- The patient is well appearing, alert and oriented x 3 today.   Lungs- Clear to ausculation bilaterally, normal work of breathing.  Heart- {Blank single:19197::"Regular","Irregularly irregular"} rate and rhythm,  no murmurs, rubs or gallops Extremities- {EDEMA LEVEL:28147::"No"} peripheral edema, warm, dry    Studies Reviewed   Previous EP, cardiology notes.    EKG is not ordered. Personal review of EKG from  06/12/2023  shows:  SB at 53bpm. Incomplete LBBB        TTE, 06/07/2023  1. Left ventricular ejection fraction, by estimation, is 60 to 65%. The left ventricle has normal function. The left ventricle has no regional wall motion abnormalities. There is mild asymmetric left ventricular hypertrophy of the basal-septal segment. Left ventricular diastolic parameters are indeterminate.   2. Right ventricular systolic function is normal. The right ventricular size is normal. Tricuspid regurgitation signal is inadequate for assessing PA pressure.   3. Left atrial size was severely dilated.   4. Right atrial size was mildly dilated.   5. The mitral valve is normal in structure. Trivial mitral valve regurgitation. No evidence of mitral stenosis.   6. The aortic valve is normal in structure. Aortic valve regurgitation is not visualized. Aortic valve sclerosis is present, with no evidence of aortic valve stenosis.   7. The inferior vena cava is normal in size with greater than 50% respiratory variability, suggesting right atrial pressure of 3 mmHg.   Cardiac CTA, 05/16/2023 1. There is normal pulmonary vein drainage into the left atrium. (2 on the right and 2 on the left via a common trunk on left) with ostial measurements as above. 2. The left atrial appendage is a Windsock-cactus type with  two lobes and ostial size 38 x 27 mm and length 46 mm, Area 74 mm2. There is no thrombus in the left atrial appendage.  3. The esophagus runs in the left atrial midline and is not in the proximity to any of the pulmonary veins  4. Coronary calcium score 672. This is 94th percentile for age/gender.   Assessment and Plan     #) parox AFib #) typical atrial flutter S/p AF ablation   #) Hypercoag d/t parox  afib CHA2DS2-VASc Score = at least 4 [CHF History: 0, HTN History: 1, Diabetes History: 0, Stroke History: 0, Vascular Disease History: 1, Age Score: 1, Gender Score: 1].  Therefore, the patient's annual risk of stroke is 4.8 %.     {Confirm score is correct.  If not, click here to update score.  REFRESH note.  :1}   Stroke ppx - 5mg  eliquis BID, appropriately dosed No bleeding concerns    #) ***   {Are you ordering a CV Procedure (e.g. stress test, cath, DCCV, TEE, etc)?   Press F2        :[id number]}   Current medicines are reviewed at length with the patient today.   The patient {ACTIONS; HAS/DOES NOT HAVE:19233} concerns regarding her medicines.  The following changes were made today:  {NONE DEFAULTED:18576}  Labs/ tests ordered today include: *** No orders of the defined types were placed in this encounter.    Disposition: Follow up with {EPMDS:28135} or EP APP {EPFOLLOW UP:28173}   Signed, Sherie Don, NP  06/21/23  12:02 PM  Electrophysiology [facility]

## 2023-06-21 NOTE — Telephone Encounter (Signed)
 Patient left a message stating that she been having right knee pain since her right le angio on yesterday. I spoke with Dr Wyn Quaker and he is fine with sending Tramadol 50mg  1 tablet every 6 hours as needed for pain. Medication prescription was left on [facility] voicemail and patient has been notified.

## 2023-06-22 ENCOUNTER — Encounter: Payer: Self-pay | Admitting: Vascular Surgery

## 2023-06-23 ENCOUNTER — Ambulatory Visit: Admission: RE | Admit: 2023-06-23 | Source: Ambulatory Visit

## 2023-06-23 ENCOUNTER — Emergency Department
Admission: EM | Admit: 2023-06-23 | Discharge: 2023-06-23 | Disposition: A | Discharge status: Home / Self Care | Attending: Emergency Medicine | Admitting: Emergency Medicine

## 2023-06-23 ENCOUNTER — Emergency Department

## 2023-06-23 ENCOUNTER — Other Ambulatory Visit: Payer: Self-pay

## 2023-06-23 DIAGNOSIS — R059 Cough, unspecified: Secondary | ICD-10-CM | POA: Diagnosis present

## 2023-06-23 DIAGNOSIS — J069 Acute upper respiratory infection, unspecified: Secondary | ICD-10-CM | POA: Diagnosis not present

## 2023-06-23 DIAGNOSIS — I2510 Atherosclerotic heart disease of native coronary artery without angina pectoris: Secondary | ICD-10-CM | POA: Diagnosis not present

## 2023-06-23 DIAGNOSIS — J00 Acute nasopharyngitis [common cold]: Secondary | ICD-10-CM | POA: Insufficient documentation

## 2023-06-23 DIAGNOSIS — I10 Essential (primary) hypertension: Secondary | ICD-10-CM | POA: Insufficient documentation

## 2023-06-23 LAB — RESP PANEL BY RT-PCR (RSV, FLU A&B, COVID)  RVPGX2
Influenza A by PCR: NEGATIVE
Influenza B by PCR: NEGATIVE
Resp Syncytial Virus by PCR: NEGATIVE
SARS Coronavirus 2 by RT PCR: NEGATIVE

## 2023-06-23 MED ORDER — BENZONATATE 100 MG PO CAPS: ORAL_CAPSULE | ORAL | 0 refills | Status: DC

## 2023-06-23 NOTE — ED Notes (Signed)
 See triage notes. Patient c/o cough that initially started on March 11 of this year. Patient stated she was seen two weeks ago for same and it had calmed down until last night. Patient stated she took a [employer] yesterday around 1600 and then a Mucinex 12hr last night and has been coughing ever since.

## 2023-06-23 NOTE — ED Triage Notes (Signed)
 Pt to ED via EMS from home, pt states she began to have dry cough earlier tonight. Pt denies fever or congestion.

## 2023-06-23 NOTE — ED Provider Notes (Signed)
 [facility] Emergency Department Provider Note     None    (approximate)   History   Cough  HPI  [name] is a 73 y.o. female with a history of hypertension, GERD, a flutter, and CAD, presents to the ED via EMS from home.  Patient presents endorsing a dry, nonproductive cough with onset overnight.  She denies any fevers, chills, sweats, or congestion.  No frank shortness of breath, no hemoptysis, and no chest pain reported.  Physical Exam   Triage Vital Signs: ED Triage Vitals  Encounter Vitals Group     BP 06/23/23 0234 (!) 174/114     Systolic BP Percentile --      Diastolic BP Percentile --      Pulse Rate 06/23/23 0234 74     Resp 06/23/23 0234 20     Temp 06/23/23 0234 98.4 F (36.9 C)     Temp Source 06/23/23 0656 Oral     SpO2 06/23/23 0234 98 %     Weight 06/23/23 0233 166 lb (75.3 kg)     Height 06/23/23 0233 5\' 5"  (1.651 m)     Head Circumference --      Peak Flow --      Pain Score 06/23/23 0232 0     Pain Loc --      Pain Education --      Exclude from Growth Chart --     Most recent vital signs: Vitals:   06/23/23 0234 06/23/23 0656  BP: (!) 174/114 (!) 146/77  Pulse: 74 72  Resp: 20 18  Temp: 98.4 F (36.9 C) 98.4 F (36.9 C)  SpO2: 98% 100%    General Awake, no distress. *** HEENT NCAT. PERRL. EOMI. No rhinorrhea. Mucous membranes are moist.  CV:  Good peripheral perfusion. *** RESP:  Normal effort. *** ABD:  No distention. ***   ED Results / Procedures / Treatments   Labs (all labs ordered are listed, but only abnormal results are displayed) Labs Reviewed  RESP PANEL BY RT-PCR (RSV, FLU A&B, COVID)  [id number]    EKG   RADIOLOGY  I personally viewed and evaluated these images as part of my medical decision making, as well as reviewing the written report by the radiologist.  ED Provider Interpretation: No acute findings  DG Chest 2 View Result Date: 06/23/2023 CLINICAL DATA:  Dry cough. EXAM:  CHEST - 2 VIEW COMPARISON:  June 07, 2023 FINDINGS: The cardiac silhouette is mildly enlarged and unchanged in size. There is moderate severity calcification of the aortic arch. Mild, stable linear scarring and/or atelectasis is seen within the left lung base. There is no evidence of focal consolidation, pleural effusion or pneumothorax. The visualized skeletal structures are unremarkable. IMPRESSION: Stable cardiomegaly with mild, stable left basilar linear scarring and/or atelectasis. Electronically Signed   By: Aram Candela M.D.   On: 06/23/2023 04:33   PROCEDURES:  Critical Care performed: No  Procedures   MEDICATIONS ORDERED IN ED: Medications - No data to display   IMPRESSION / MDM / ASSESSMENT AND PLAN / ED COURSE  I reviewed the triage vital signs and the nursing notes.                              Differential diagnosis includes, but is not limited to, COVID, flu, RSV, viral URI, bronchitis, CAP, reflux, eczema exacerbation  Patient's presentation is most consistent with acute presentation  with potential threat to life or bodily function.  Patient's diagnosis is consistent with ***. Patient will be discharged home with prescriptions for ***. Patient is to follow up with *** as needed or otherwise directed. Patient is given ED precautions to return to the ED for any worsening or new symptoms.     FINAL CLINICAL IMPRESSION(S) / ED DIAGNOSES   Final diagnoses:  Viral URI with cough  Acute rhinitis     Rx / DC Orders   ED Discharge Orders     None        Note:  This document was prepared using Dragon voice recognition software and may include unintentional dictation errors.

## 2023-06-23 NOTE — Discharge Instructions (Addendum)
 Take the prescription cough medicine as needed.  Take OTC Delsym (dextromethorphan) to help with additional cough relief.  You may consider holding the Mucinex as it helped to increase nasal drainage.  Consider taking OTC allergy medicine like Claritin, Zyrtec, Allegra, or Benadryl.  See your primary provider or return to ED if needed.

## 2023-06-24 ENCOUNTER — Ambulatory Visit: Attending: Cardiology | Admitting: Cardiology

## 2023-06-24 ENCOUNTER — Encounter: Payer: Self-pay | Admitting: Cardiology

## 2023-06-24 VITALS — BP 132/78 | HR 57 | Ht 65.0 in | Wt 172.2 lb

## 2023-06-24 DIAGNOSIS — I483 Typical atrial flutter: Secondary | ICD-10-CM

## 2023-06-24 DIAGNOSIS — I480 Paroxysmal atrial fibrillation: Secondary | ICD-10-CM | POA: Diagnosis not present

## 2023-06-24 DIAGNOSIS — D6869 Other thrombophilia: Secondary | ICD-10-CM | POA: Diagnosis not present

## 2023-06-24 MED ORDER — AMLODIPINE BESYLATE 10 MG PO TABS: 10.0000 mg | ORAL_TABLET | Freq: Every day | ORAL | 3 refills | Status: DC

## 2023-06-24 NOTE — Patient Instructions (Signed)
 Medication Instructions:  No changes   *If you need a refill on your cardiac medications before your next appointment, please call your pharmacy*   Lab Work: Not needed    Testing/Procedures:  Not needed  Follow-Up: At [facility], you and your health needs are our priority.  As part of our continuing mission to provide you with exceptional heart care, we have created designated Provider Care Teams.  These Care Teams include your primary Cardiologist (physician) and Advanced Practice Providers (APPs -  Physician Assistants and Nurse Practitioners) who all work together to provide you with the care you need, when you need it.     Your next appointment:   2 month(s) ( 08/28/23 at 11 am)   The format for your next appointment:   In Person  Provider:   Sherie Don, NP

## 2023-06-30 ENCOUNTER — Ambulatory Visit
Admission: RE | Admit: 2023-06-30 | Discharge: 2023-06-30 | Disposition: A | Discharge status: Home / Self Care | Source: Ambulatory Visit | Attending: Nurse Practitioner | Admitting: Nurse Practitioner

## 2023-06-30 DIAGNOSIS — K769 Liver disease, unspecified: Secondary | ICD-10-CM

## 2023-07-04 ENCOUNTER — Inpatient Hospital Stay
Admission: EM | Admit: 2023-07-04 | Discharge: 2023-07-08 | DRG: 661 | Disposition: A | Discharge status: Home / Self Care | Attending: Hospitalist | Admitting: Hospitalist

## 2023-07-04 ENCOUNTER — Other Ambulatory Visit: Payer: Self-pay

## 2023-07-04 ENCOUNTER — Emergency Department

## 2023-07-04 DIAGNOSIS — I480 Paroxysmal atrial fibrillation: Secondary | ICD-10-CM | POA: Diagnosis present

## 2023-07-04 DIAGNOSIS — N136 Pyonephrosis: Principal | ICD-10-CM | POA: Diagnosis present

## 2023-07-04 DIAGNOSIS — Z888 Allergy status to other drugs, medicaments and biological substances status: Secondary | ICD-10-CM

## 2023-07-04 DIAGNOSIS — Z90710 Acquired absence of both cervix and uterus: Secondary | ICD-10-CM

## 2023-07-04 DIAGNOSIS — Z833 Family history of diabetes mellitus: Secondary | ICD-10-CM

## 2023-07-04 DIAGNOSIS — N390 Urinary tract infection, site not specified: Secondary | ICD-10-CM | POA: Diagnosis not present

## 2023-07-04 DIAGNOSIS — Z7982 Long term (current) use of aspirin: Secondary | ICD-10-CM

## 2023-07-04 DIAGNOSIS — E785 Hyperlipidemia, unspecified: Secondary | ICD-10-CM | POA: Diagnosis present

## 2023-07-04 DIAGNOSIS — N12 Tubulo-interstitial nephritis, not specified as acute or chronic: Secondary | ICD-10-CM | POA: Diagnosis not present

## 2023-07-04 DIAGNOSIS — R109 Unspecified abdominal pain: Secondary | ICD-10-CM

## 2023-07-04 DIAGNOSIS — Z87891 Personal history of nicotine dependence: Secondary | ICD-10-CM

## 2023-07-04 DIAGNOSIS — I2510 Atherosclerotic heart disease of native coronary artery without angina pectoris: Secondary | ICD-10-CM | POA: Diagnosis present

## 2023-07-04 DIAGNOSIS — N10 Acute tubulo-interstitial nephritis: Secondary | ICD-10-CM | POA: Insufficient documentation

## 2023-07-04 DIAGNOSIS — Z79899 Other long term (current) drug therapy: Secondary | ICD-10-CM

## 2023-07-04 DIAGNOSIS — Z7951 Long term (current) use of inhaled steroids: Secondary | ICD-10-CM

## 2023-07-04 DIAGNOSIS — I739 Peripheral vascular disease, unspecified: Secondary | ICD-10-CM | POA: Diagnosis present

## 2023-07-04 DIAGNOSIS — N201 Calculus of ureter: Secondary | ICD-10-CM | POA: Diagnosis not present

## 2023-07-04 DIAGNOSIS — E039 Hypothyroidism, unspecified: Secondary | ICD-10-CM | POA: Diagnosis present

## 2023-07-04 DIAGNOSIS — Z7901 Long term (current) use of anticoagulants: Secondary | ICD-10-CM

## 2023-07-04 DIAGNOSIS — N23 Unspecified renal colic: Secondary | ICD-10-CM | POA: Diagnosis present

## 2023-07-04 DIAGNOSIS — N200 Calculus of kidney: Secondary | ICD-10-CM

## 2023-07-04 DIAGNOSIS — I10 Essential (primary) hypertension: Secondary | ICD-10-CM | POA: Diagnosis present

## 2023-07-04 DIAGNOSIS — N1330 Unspecified hydronephrosis: Secondary | ICD-10-CM | POA: Diagnosis present

## 2023-07-04 DIAGNOSIS — Z79890 Hormone replacement therapy (postmenopausal): Secondary | ICD-10-CM

## 2023-07-04 DIAGNOSIS — E876 Hypokalemia: Secondary | ICD-10-CM | POA: Diagnosis not present

## 2023-07-04 DIAGNOSIS — N202 Calculus of kidney with calculus of ureter: Principal | ICD-10-CM | POA: Diagnosis present

## 2023-07-04 DIAGNOSIS — Z91040 Latex allergy status: Secondary | ICD-10-CM

## 2023-07-04 DIAGNOSIS — Z87442 Personal history of urinary calculi: Secondary | ICD-10-CM

## 2023-07-04 DIAGNOSIS — Z8249 Family history of ischemic heart disease and other diseases of the circulatory system: Secondary | ICD-10-CM

## 2023-07-04 DIAGNOSIS — Z880 Allergy status to penicillin: Secondary | ICD-10-CM

## 2023-07-04 DIAGNOSIS — K219 Gastro-esophageal reflux disease without esophagitis: Secondary | ICD-10-CM | POA: Diagnosis present

## 2023-07-04 DIAGNOSIS — Z96641 Presence of right artificial hip joint: Secondary | ICD-10-CM | POA: Diagnosis present

## 2023-07-04 LAB — CBC WITH DIFFERENTIAL/PLATELET
Abs Immature Granulocytes: 0.04 10*3/uL (ref 0.00–0.07)
Basophils Absolute: 0 10*3/uL (ref 0.0–0.1)
Basophils Relative: 0 %
Eosinophils Absolute: 0.1 10*3/uL (ref 0.0–0.5)
Eosinophils Relative: 1 %
HCT: 33.7 % — ABNORMAL LOW (ref 36.0–46.0)
Hemoglobin: 10.3 g/dL — ABNORMAL LOW (ref 12.0–15.0)
Immature Granulocytes: 0 %
Lymphocytes Relative: 16 %
Lymphs Abs: 1.4 10*3/uL (ref 0.7–4.0)
MCH: 23.4 pg — ABNORMAL LOW (ref 26.0–34.0)
MCHC: 30.6 g/dL (ref 30.0–36.0)
MCV: 76.6 fL — ABNORMAL LOW (ref 80.0–100.0)
Monocytes Absolute: 0.6 10*3/uL (ref 0.1–1.0)
Monocytes Relative: 7 %
Neutro Abs: 6.8 10*3/uL (ref 1.7–7.7)
Neutrophils Relative %: 76 %
Platelets: 295 10*3/uL (ref 150–400)
RBC: 4.4 MIL/uL (ref 3.87–5.11)
RDW: 14.6 % (ref 11.5–15.5)
WBC: 9.1 10*3/uL (ref 4.0–10.5)
nRBC: 0 % (ref 0.0–0.2)

## 2023-07-04 LAB — LIPASE, BLOOD: Lipase: 23 U/L (ref 11–51)

## 2023-07-04 LAB — URINALYSIS, W/ REFLEX TO CULTURE (INFECTION SUSPECTED)
Bilirubin Urine: NEGATIVE
Glucose, UA: NEGATIVE mg/dL
Ketones, ur: NEGATIVE mg/dL
Nitrite: NEGATIVE
Protein, ur: NEGATIVE mg/dL
RBC / HPF: >50 RBC/hpf (ref 0–5)
Specific Gravity, Urine: 1.014 (ref 1.005–1.030)
pH: 6 (ref 5.0–8.0)

## 2023-07-04 LAB — COMPREHENSIVE METABOLIC PANEL WITH GFR
ALT: 12 U/L (ref 0–44)
AST: 22 U/L (ref 15–41)
Albumin: 3.8 g/dL (ref 3.5–5.0)
Alkaline Phosphatase: 53 U/L (ref 38–126)
Anion gap: 7 (ref 5–15)
BUN: 15 mg/dL (ref 8–23)
CO2: 26 mmol/L (ref 22–32)
Calcium: 8.8 mg/dL — ABNORMAL LOW (ref 8.9–10.3)
Chloride: 108 mmol/L (ref 98–111)
Creatinine, Ser: 0.97 mg/dL (ref 0.44–1.00)
GFR, Estimated: >60 mL/min (ref >60)
Glucose, Bld: 141 mg/dL — ABNORMAL HIGH (ref 70–99)
Potassium: 3.7 mmol/L (ref 3.5–5.1)
Sodium: 141 mmol/L (ref 135–145)
Total Bilirubin: 0.5 mg/dL (ref 0.0–1.2)
Total Protein: 6.8 g/dL (ref 6.5–8.1)

## 2023-07-04 LAB — LACTIC ACID, PLASMA
Lactic Acid, Venous: 1.6 mmol/L (ref 0.5–1.9)
Lactic Acid, Venous: 1 mmol/L (ref 0.5–1.9)

## 2023-07-04 MED ORDER — SODIUM CHLORIDE 0.9 % IV BOLUS
500.0000 mL | Freq: Once | INTRAVENOUS | Status: AC
  Administered 2023-07-04: 500 mL via INTRAVENOUS

## 2023-07-04 MED ORDER — ENOXAPARIN SODIUM 40 MG/0.4ML IJ SOSY
40.0000 mg | PREFILLED_SYRINGE | INTRAMUSCULAR | Status: DC
  Administered 2023-07-04 – 2023-07-07 (×4): 40 mg via SUBCUTANEOUS
  Filled 2023-07-04 (×4): qty 0.4

## 2023-07-04 MED ORDER — ALBUTEROL SULFATE (2.5 MG/3ML) 0.083% IN NEBU: 2.5000 mg | INHALATION_SOLUTION | RESPIRATORY_TRACT | Status: DC | PRN

## 2023-07-04 MED ORDER — ONDANSETRON HCL 4 MG PO TABS
4.0000 mg | ORAL_TABLET | Freq: Four times a day (QID) | ORAL | Status: DC | PRN
Start: 2023-07-04 — End: 2023-07-08

## 2023-07-04 MED ORDER — BISACODYL 5 MG PO TBEC: 5.0000 mg | DELAYED_RELEASE_TABLET | Freq: Every day | ORAL | Status: DC | PRN

## 2023-07-04 MED ORDER — ACETAMINOPHEN 500 MG PO TABS
1000.0000 mg | ORAL_TABLET | Freq: Once | ORAL | Status: AC
  Administered 2023-07-04: 1000 mg via ORAL
  Filled 2023-07-04: qty 2

## 2023-07-04 MED ORDER — MORPHINE SULFATE (PF) 4 MG/ML IV SOLN
4.0000 mg | Freq: Once | INTRAVENOUS | Status: AC
  Administered 2023-07-04: 4 mg via INTRAVENOUS
  Filled 2023-07-04: qty 1

## 2023-07-04 MED ORDER — OXYCODONE HCL 5 MG PO TABS
5.0000 mg | ORAL_TABLET | ORAL | Status: DC | PRN
  Administered 2023-07-04: 5 mg via ORAL
  Filled 2023-07-04 (×2): qty 1

## 2023-07-04 MED ORDER — LORATADINE 10 MG PO TABS
10.0000 mg | ORAL_TABLET | Freq: Every evening | ORAL | Status: DC
  Administered 2023-07-04 – 2023-07-07 (×4): 10 mg via ORAL
  Filled 2023-07-04 (×4): qty 1

## 2023-07-04 MED ORDER — SODIUM CHLORIDE 0.9 % IV SOLN: INTRAVENOUS | Status: AC

## 2023-07-04 MED ORDER — ACETAMINOPHEN 325 MG PO TABS: 650.0000 mg | ORAL_TABLET | Freq: Four times a day (QID) | ORAL | Status: DC | PRN

## 2023-07-04 MED ORDER — METOCLOPRAMIDE HCL 5 MG/ML IJ SOLN: 10.0000 mg | Freq: Once | INTRAMUSCULAR | Status: DC

## 2023-07-04 MED ORDER — FLUTICASONE FUROATE-VILANTEROL 100-25 MCG/ACT IN AEPB
1.0000 | INHALATION_SPRAY | Freq: Every day | RESPIRATORY_TRACT | Status: DC
  Administered 2023-07-05 – 2023-07-08 (×4): 1 via RESPIRATORY_TRACT
  Filled 2023-07-04: qty 28

## 2023-07-04 MED ORDER — SODIUM CHLORIDE 0.9 % IV SOLN
2.0000 g | INTRAVENOUS | Status: DC
  Administered 2023-07-04 – 2023-07-08 (×5): 2 g via INTRAVENOUS
  Filled 2023-07-04 (×5): qty 20

## 2023-07-04 MED ORDER — PANTOPRAZOLE SODIUM 40 MG PO TBEC
40.0000 mg | DELAYED_RELEASE_TABLET | Freq: Every day | ORAL | Status: DC
  Administered 2023-07-04 – 2023-07-08 (×5): 40 mg via ORAL
  Filled 2023-07-04 (×5): qty 1

## 2023-07-04 MED ORDER — SODIUM CHLORIDE 0.9 % IV SOLN
12.5000 mg | Freq: Once | INTRAVENOUS | Status: AC
  Administered 2023-07-04: 12.5 mg via INTRAVENOUS
  Filled 2023-07-04: qty 12.5

## 2023-07-04 MED ORDER — POLYETHYLENE GLYCOL 3350 17 G PO PACK
17.0000 g | PACK | Freq: Every day | ORAL | Status: DC
  Administered 2023-07-04 – 2023-07-07 (×2): 17 g via ORAL
  Filled 2023-07-04 (×4): qty 1

## 2023-07-04 MED ORDER — ONDANSETRON HCL 4 MG/2ML IJ SOLN
4.0000 mg | Freq: Once | INTRAMUSCULAR | Status: AC
  Administered 2023-07-04: 4 mg via INTRAVENOUS
  Filled 2023-07-04: qty 2

## 2023-07-04 MED ORDER — MORPHINE SULFATE (PF) 2 MG/ML IV SOLN
2.0000 mg | INTRAVENOUS | Status: DC | PRN
  Administered 2023-07-04 – 2023-07-07 (×5): 2 mg via INTRAVENOUS
  Filled 2023-07-04 (×5): qty 1

## 2023-07-04 MED ORDER — AMLODIPINE BESYLATE 10 MG PO TABS
10.0000 mg | ORAL_TABLET | Freq: Every day | ORAL | Status: DC
  Administered 2023-07-04 – 2023-07-08 (×5): 10 mg via ORAL
  Filled 2023-07-04: qty 1
  Filled 2023-07-04: qty 2
  Filled 2023-07-04 (×3): qty 1

## 2023-07-04 MED ORDER — LOSARTAN POTASSIUM 50 MG PO TABS
100.0000 mg | ORAL_TABLET | Freq: Every day | ORAL | Status: DC
  Administered 2023-07-04 – 2023-07-08 (×5): 100 mg via ORAL
  Filled 2023-07-04 (×5): qty 2

## 2023-07-04 MED ORDER — LEVOTHYROXINE SODIUM 50 MCG PO TABS
100.0000 ug | ORAL_TABLET | Freq: Every day | ORAL | Status: DC
  Administered 2023-07-04 – 2023-07-08 (×5): 100 ug via ORAL
  Filled 2023-07-04 (×4): qty 2
  Filled 2023-07-04: qty 1

## 2023-07-04 MED ORDER — ASPIRIN 81 MG PO TBEC
81.0000 mg | DELAYED_RELEASE_TABLET | Freq: Every day | ORAL | Status: DC
  Administered 2023-07-04 – 2023-07-08 (×5): 81 mg via ORAL
  Filled 2023-07-04 (×5): qty 1

## 2023-07-04 MED ORDER — ONDANSETRON HCL 4 MG/2ML IJ SOLN
4.0000 mg | Freq: Four times a day (QID) | INTRAMUSCULAR | Status: DC | PRN
  Administered 2023-07-04 – 2023-07-07 (×4): 4 mg via INTRAVENOUS
  Filled 2023-07-04 (×4): qty 2

## 2023-07-04 MED ORDER — ATORVASTATIN CALCIUM 10 MG PO TABS
10.0000 mg | ORAL_TABLET | Freq: Every day | ORAL | Status: DC
  Administered 2023-07-04 – 2023-07-08 (×5): 10 mg via ORAL
  Filled 2023-07-04 (×5): qty 1

## 2023-07-04 MED ORDER — ACETAMINOPHEN 650 MG RE SUPP: 650.0000 mg | Freq: Four times a day (QID) | RECTAL | Status: DC | PRN

## 2023-07-04 MED ORDER — KETOROLAC TROMETHAMINE 15 MG/ML IJ SOLN
15.0000 mg | Freq: Once | INTRAMUSCULAR | Status: AC
  Administered 2023-07-04: 15 mg via INTRAVENOUS
  Filled 2023-07-04: qty 1

## 2023-07-04 MED ORDER — ACETAMINOPHEN ER 650 MG PO TBCR: 650.0000 mg | EXTENDED_RELEASE_TABLET | Freq: Three times a day (TID) | ORAL | Status: DC | PRN

## 2023-07-04 NOTE — ED Triage Notes (Signed)
 From home by [facility].for left flank pain and llq pain that started approx 1 hour ago.

## 2023-07-04 NOTE — Consult Note (Signed)
 Urology Consult  I have been asked to see the patient by Dr. Alva Jewels, for evaluation and management of nephrolithiasis.  Chief Complaint: Left flank, LLQ pain  History of Present Illness: [name] is a 73 y.o. year old female with PMH chronic A-flutter s/p recent ablation on Eliquis , PAD s/p RLE angioplasty/thrombectomy/stent 2 weeks ago, CAD who presented to the ED overnight with reports of left flank and LLQ pain.  CT stone study showed left hydronephrosis secondary to a 9 mm left UPJ stone with perinephric stranding.  No other left renal stones.  She has some small nonobstructing right renal stones.  Admission labs notable for creatinine 0.97 (baseline 0.85); lactate 1.6; white count 9.1; and UA with no nitrites, >50 RBC/hpf, 21-50 WBC/hpf, 21-50 squamous epithelial cells/hpf, and rare bacteria.  She has been afebrile, VSS.  Urine culture pending, on antibiotics as below.  Today she reports no nausea and adequate pain control in the ED. No prior history of stones.  Anti-infectives (From admission, onward)    Start     Dose/Rate Route Frequency Ordered Stop   07/04/23 0800  cefTRIAXone  (ROCEPHIN ) 2 g in sodium chloride  0.9 % 100 mL IVPB        2 g 200 mL/hr over 30 Minutes Intravenous Every 24 hours 07/04/23 0729         Past Medical History:  Diagnosis Date   Anemia    Arthritis    Atrial flutter (HCC) 09/2015   a.) CHADS2VASc 2 (sex, HTN); b.) s/p DCCV 11/11/2015 (150 J); c.) s/p RFCA 01/17/2016; d.) rate/rhythm maintained without pharmacological intervention; no chronic anticoagulation.   Coronary artery calcification seen on CT scan    Diastolic dysfunction 08/23/2019   a.)  TTE 08/23/2019: EF 55-60%, severe LAE, mild-mod MR, PASP 46.2, G2DD   Dyspnea    Essential hypertension    GERD (gastroesophageal reflux disease)    Hepatic steatosis    Incomplete left bundle branch block (LBBB)    Pneumonia    as a child   Subclinical hypothyroidism    Tobacco abuse      Past Surgical History:  Procedure Laterality Date   ABDOMINAL HYSTERECTOMY     ABLATION OF DYSRHYTHMIC FOCUS  01/16/2016   ATRIAL FIBRILLATION ABLATION N/A 05/24/2023   Procedure: ATRIAL FIBRILLATION ABLATION;  Surgeon: Boyce Byes, MD;  Location: [facility] INVASIVE CV LAB;  Service: Cardiovascular;  Laterality: N/A;   ELECTROPHYSIOLOGIC STUDY N/A 11/11/2015   Procedure: CARDIOVERSION;  Surgeon: Devorah Fonder, MD;  Location: [facility] ORS;  Service: Cardiovascular;  Laterality: N/A;   ELECTROPHYSIOLOGIC STUDY N/A 01/16/2016   Procedure: A-Flutter Ablation;  Surgeon: Verona Goodwill, MD;  [facility] INVAS[facility] CV LAB;  Service: Cardiovascular;  Laterality: N/A;   LOWER EXTREMITY ANGIOGRAPHY Right 06/20/2023   Procedure: Lower Extremity Angiography;  Surgeon: Celso College, MD;  Location: [facility] INVASIVE CV LAB;  Service: Cardiovascular;  Laterality: Right;   TOTAL HIP ARTHROPLASTY Right 12/10/2021   Procedure: TOTAL HIP ARTHROPLASTY ANTERIOR APPROACH;  Surgeon: Venus Ginsberg, MD;  Location: [facility] ORS;  Service: Orthopedics;  Laterality: Right;    Home Medications:  No outpatient medications have been marked as taking for the 07/04/23 encounter [facility] Encounter).    Allergies:  Allergies  Allergen Reactions   Lisinopril  Swelling    Swelling of lips and face.    Tizanidine  Other (See Comments)    bradycardia   Amoxicillin  Itching and Rash   Latex Itching and Rash    Family History  Problem  Relation Age of Onset   Hypertension Mother    Diabetes Mother    Diabetes Father    Diabetes Brother    Hypertension Brother    Hypertension Brother    Heart attack Brother    Diabetes Brother    Breast cancer Neg Hx     Social History:  reports that she quit smoking about 7 years ago. Her smoking use included cigarettes. She has never used smokeless tobacco. She reports current alcohol use. She reports that she does not use drugs.  ROS: A complete review of systems was performed.  All  systems are negative except for pertinent findings as noted.  Physical Exam:  Vital signs in last 24 hours: Temp:  [97.6 F (36.4 C)] 97.6 F (36.4 C) (04/21 0349) Pulse Rate:  [52-54] 54 (04/21 0800) Resp:  [19-22] 19 (04/21 0800) BP: (189)/(63) 189/63 (04/21 0349) SpO2:  [92 %-99 %] 92 % (04/21 0800) Weight:  [78.1 kg] 78.1 kg (04/21 0348) Constitutional:  Alert and oriented, no acute distress HEENT: [place] AT, moist mucus membranes Cardiovascular: No clubbing, cyanosis, or edema Respiratory: Normal respiratory effort Skin: No rashes, bruises or suspicious lesions Neurologic: Grossly intact, no focal deficits, moving all 4 extremities Psychiatric: Normal mood and affect  Laboratory Data:  Recent Labs    07/04/23 0351  WBC 9.1  HGB 10.3*  HCT 33.7*   Recent Labs    07/04/23 0351  NA 141  K 3.7  CL 108  CO2 26  GLUCOSE 141*  BUN 15  CREATININE 0.97  CALCIUM  8.8*   Urinalysis    Component Value Date/Time   COLORURINE YELLOW (A) 07/04/2023 0539   APPEARANCEUR CLOUDY (A) 07/04/2023 0539   LABSPEC 1.014 07/04/2023 0539   PHURINE 6.0 07/04/2023 0539   GLUCOSEU NEGATIVE 07/04/2023 0539   HGBUR MODERATE (A) 07/04/2023 0539   BILIRUBINUR NEGATIVE 07/04/2023 0539   KETONESUR NEGATIVE 07/04/2023 0539   PROTEINUR NEGATIVE 07/04/2023 0539   NITRITE NEGATIVE 07/04/2023 0539   LEUKOCYTESUR LARGE (A) 07/04/2023 0539   Results for orders placed or performed during the hospital encounter of 06/23/23  Resp panel by RT-PCR (RSV, Flu A&B, Covid) Anterior Nasal Swab     Status: None   Collection Time: 06/23/23  2:34 AM   Specimen: Anterior Nasal Swab  Result Value Ref Range Status   SARS Coronavirus 2 by RT PCR NEGATIVE NEGATIVE Final    Comment: (NOTE) SARS-CoV-2 target nucleic acids are NOT DETECTED.  The SARS-CoV-2 RNA is generally detectable in upper respiratory specimens during the acute phase of infection. The lowest concentration of SARS-CoV-2 viral copies this assay  can detect is 138 copies/mL. A negative result does not preclude SARS-Cov-2 infection and should not be used as the sole basis for treatment or other patient management decisions. A negative result may occur with  improper specimen collection/handling, submission of specimen other than nasopharyngeal swab, presence of viral mutation(s) within the areas targeted by this assay, and inadequate number of viral copies(<138 copies/mL). A negative result must be combined with clinical observations, patient history, and epidemiological information. The expected result is Negative.  Fact Sheet for Patients:  BloggerCourse.com  Fact Sheet for Healthcare Providers:  SeriousBroker.it  This test is no t yet approved or cleared by the [place]  FDA and  has been authorized for detection and/or diagnosis of SARS-CoV-2 by FDA under an Emergency Use Authorization (EUA). This EUA will remain  in effect (meaning this test can be used) for the duration of the COVID-19  declaration under Section 564(b)(1) of the Act, 21 U.S.C.section 360bbb-3(b)(1), unless the authorization is terminated  or revoked sooner.       Influenza A by PCR NEGATIVE NEGATIVE Final   Influenza B by PCR NEGATIVE NEGATIVE Final    Comment: (NOTE) The Xpert Xpress SARS-CoV-2/FLU/RSV plus assay is intended as an aid in the diagnosis of influenza from Nasopharyngeal swab specimens and should not be used as a sole basis for treatment. Nasal washings and aspirates are unacceptable for Xpert Xpress SARS-CoV-2/FLU/RSV testing.  Fact Sheet for Patients: BloggerCourse.com  Fact Sheet for Healthcare Providers: SeriousBroker.it  This test is not yet approved or cleared by the [place]  FDA and has been authorized for detection and/or diagnosis of SARS-CoV-2 by FDA under an Emergency Use Authorization (EUA). This EUA will  remain in effect (meaning this test can be used) for the duration of the COVID-19 declaration under Section 564(b)(1) of the Act, 21 U.S.C. section 360bbb-3(b)(1), unless the authorization is terminated or revoked.     Resp Syncytial Virus by PCR NEGATIVE NEGATIVE Final    Comment: (NOTE) Fact Sheet for Patients: BloggerCourse.com  Fact Sheet for Healthcare Providers: SeriousBroker.it  This test is not yet approved or cleared by the [place]  FDA and has been authorized for detection and/or diagnosis of SARS-CoV-2 by FDA under an Emergency Use Authorization (EUA). This EUA will remain in effect (meaning this test can be used) for the duration of the COVID-19 declaration under Section 564(b)(1) of the Act, 21 U.S.C. section 360bbb-3(b)(1), unless the authorization is terminated or revoked.  Performed at [facility], [address] [place], [place] [address]     Radiologic Imaging: CT Renal Stone Study Result Date: 07/04/2023 CLINICAL DATA:  Abdominal pain/flank pain EXAM: CT ABDOMEN AND PELVIS WITHOUT CONTRAST TECHNIQUE: Multidetector CT imaging of the abdomen and pelvis was performed following the standard protocol without IV contrast. RADIATION DOSE REDUCTION: This exam was performed according to the departmental dose-optimization program which includes automated exposure control, adjustment of the mA and/or kV according to patient size and/or use of iterative reconstruction technique. COMPARISON:  Abdominal sonogram 09/11/2020 FINDINGS: Lower chest: Scarring noted within the lung bases. No pleural fluid or consolidative change. Cardiac enlargement. Coronary artery and aortic atherosclerotic calcifications. Hepatobiliary: No suspicious liver abnormality. Focal fatty deposition noted about the inferior aspect of the falciform ligament. Gallstones are identified. No gallbladder wall thickening or pericholecystic  inflammation. No bile duct dilatation. Pancreas: Unremarkable. No pancreatic ductal dilatation or surrounding inflammatory changes. Spleen: Normal in size without focal abnormality. Adrenals/Urinary Tract: Normal adrenal glands. 2 stones identified within the right renal collecting system. These measure up to 2 mm. No right-sided obstructive uropathy. Upper pole right kidney cyst measures 1.1 cm. No follow-up imaging recommended. Left-sided hydronephrosis with perinephric soft tissue stranding. At the right UPJ there is a stone measuring 9 mm, image 38/2 and coronal image 52/5. The left ureter is decompressed beyond the UPJ. Exam detail within the pelvis is diminished due to streak artifact from right hip arthroplasty device. Within this limitation, no focal bladder abnormality noted. Stomach/Bowel: Stomach appears normal. The appendix is visualized and is normal. No bowel wall thickening, inflammation or distension. Vascular/Lymphatic: Aortic atherosclerosis. No aneurysm. No abdominopelvic adenopathy. Reproductive: Status post hysterectomy. No adnexal masses. Other: No free fluid or fluid collections. Small fat containing umbilical hernia. No free fluid or fluid collections. No signs of pneumoperitoneum. Musculoskeletal: Status post right hip arthroplasty. Severe degenerative changes noted in the left hip. No acute abnormality. IMPRESSION:  1. Left-sided hydronephrosis with perinephric soft tissue stranding. There is a 9 mm stone at the left UPJ. 2. Nonobstructing right renal calculi. 3. Cholelithiasis. 4. Severe degenerative changes in the left hip. 5.  Aortic Atherosclerosis (ICD10-I70.0). Electronically Signed   By: Kimberley Penman M.D.   On: 07/04/2023 05:48   Assessment & Plan:  73 year old female with PMH chronic A-flutter s/p recent ablation on Eliquis , PAD s/p RLE angioplasty/thrombectomy/stent 2 weeks ago, and CAD admitted with an obstructing 9 mm left UPJ stone with possible UTI versus  pyelonephritis.  She is clinically stable on empiric antibiotics, question UTI/pyelonephritis versus urinary contamination.  I offered her left ureteral stent placement with Dr. Cherylene Corrente tomorrow for urinary decompression in the setting of her obstructing and possibly infected stone.  We discussed that she will require definitive stone management at a later date.  She expressed understanding and wishes to proceed.  Recommendations: -Okay for diet today - N.p.o. at midnight, sips with meds okay - Cystoscopy and left ureteral stent placement with Dr. Cherylene Corrente tomorrow - Outpatient left ureteroscopy with laser lithotripsy and stent exchange versus ESWL in 2 to 3 weeks after a course of culture appropriate antibiotics  Thank you for involving me in this patient's care, I will continue to follow along.  Pearson Reasons, PA-C 07/04/2023 8:55 AM

## 2023-07-04 NOTE — ED Notes (Signed)
Patient was given a bedside commode. 

## 2023-07-04 NOTE — ED Provider Notes (Addendum)
 [facility] Provider Note    Event Date/Time   First MD Initiated Contact with Patient 07/04/23 [phone]     (approximate)   History   Flank Pain   HPI  [name] is a 73 y.o. female   Past medical history of atrial fibrillation on Eliquis , hypothyroid, CAD, GERD who presents the emergency department with acute onset left flank pain radiating to the groin that woke her from sleep this morning.  She went to bed feeling well, no recent illnesses.  No dysuria or frequency.  It is quite a severe pain that she is never felt before.  She denies any trauma.   External Medical Documents Reviewed: Cardiology note from April, history of A-fib on anticoagulation      Physical Exam   Triage Vital Signs: ED Triage Vitals  Encounter Vitals Group     BP 07/04/23 0349 (!) 189/63     Systolic BP Percentile --      Diastolic BP Percentile --      Pulse Rate 07/04/23 0349 (!) 52     Resp 07/04/23 0349 (!) 22     Temp 07/04/23 0349 97.6 F (36.4 C)     Temp Source 07/04/23 0349 Oral     SpO2 07/04/23 0349 99 %     Weight 07/04/23 0348 172 lb 2.9 oz (78.1 kg)     Height 07/04/23 0348 5\' 5"  (1.651 m)     Head Circumference --      Peak Flow --      Pain Score 07/04/23 0356 9     Pain Loc --      Pain Education --      Exclude from Growth Chart --     Most recent vital signs: Vitals:   07/04/23 0349  BP: (!) 189/63  Pulse: (!) 52  Resp: (!) 22  Temp: 97.6 F (36.4 C)  SpO2: 99%    General: Awake, no distress.  CV:  Good peripheral perfusion.  Resp:  Normal effort.  Abd:  No distention.  Other:  She looks to be in quite a bit of pain, grabbing at her left flank there is CVA tenderness there but a relatively soft benign abdominal exam without rigidity or guarding.  She is hypertensive.  No fever.   ED Results / Procedures / Treatments   Labs (all labs ordered are listed, but only abnormal results are displayed) Labs Reviewed  COMPREHENSIVE  METABOLIC PANEL WITH GFR - Abnormal; Notable for the following components:      Result Value   Glucose, Bld 141 (*)    Calcium  8.8 (*)    All other components within normal limits  CBC WITH DIFFERENTIAL/PLATELET - Abnormal; Notable for the following components:   Hemoglobin 10.3 (*)    HCT 33.7 (*)    MCV 76.6 (*)    MCH 23.4 (*)    All other components within normal limits  URINALYSIS, W/ REFLEX TO CULTURE (INFECTION SUSPECTED) - Abnormal; Notable for the following components:   Color, Urine YELLOW (*)    APPearance CLOUDY (*)    Hgb urine dipstick MODERATE (*)    Leukocytes,Ua LARGE (*)    Bacteria, UA RARE (*)    All other components within normal limits  URINE CULTURE  LIPASE, BLOOD  LACTIC ACID, PLASMA  LACTIC ACID, PLASMA     I ordered and reviewed the above labs they are notable for cell counts unremarkable and consistent with prior testing.  RADIOLOGY I independently reviewed and interpreted CT of the abdomen pelvis and see a hyperdensity in the left renal area concerning for proximal ureter stone I also reviewed radiologist's formal read.   PROCEDURES:  Critical Care performed: No  Procedures   MEDICATIONS ORDERED IN ED: Medications  acetaminophen  (TYLENOL ) tablet 1,000 mg (has no administration in time range)  ondansetron  (ZOFRAN ) injection 4 mg (has no administration in time range)  ondansetron  (ZOFRAN ) injection 4 mg (4 mg Intravenous Given 07/04/23 0357)  sodium chloride  0.9 % bolus 500 mL (0 mLs Intravenous Stopped 07/04/23 0538)  morphine  (PF) 4 MG/ML injection 4 mg (4 mg Intravenous Given 07/04/23 0356)  ketorolac  (TORADOL ) 15 MG/ML injection 15 mg (15 mg Intravenous Given 07/04/23 0357)  morphine  (PF) 4 MG/ML injection 4 mg (4 mg Intravenous Given 07/04/23 0538)    IMPRESSION / MDM / ASSESSMENT AND PLAN / ED COURSE  I reviewed the triage vital signs and the nursing notes.                                Patient's presentation is most consistent  with acute presentation with potential threat to life or bodily function.  Differential diagnosis includes, but is not limited to, renal colic/ureterolithiasis, obstructive uropathy, intra-abdominal infection, diverticulitis, musculoskeletal pain   The patient is on the cardiac monitor to evaluate for evidence of arrhythmia and/or significant heart rate changes.  MDM:    Acute onset flank pain radiation to the groin with no history of kidney stone but suspicious for renal colic pain.  Give IV pain medications antiemetic upfront and lab analysis basic labs, urinalysis and a CT flank.  Proximal ureter stone 9 mm quite large and pain is still quite bad after 2 doses of 4 mg IV morphine  for this 73 year old woman.  She has also had Toradol .  Add on Tylenol .  Urinalysis is pending.  Given inadequate pain control and a large stone plan will be for hospitalist admission and urology consultation while inpatient for procedural management as needed.  Attempt to reach urologist nearing change of shift at this time was unsuccessful, spoke with admitting hospitalist and they will consult daytime urologist at change of shift.  Urinalysis obtained is contaminated with many squamous epithelial cells.  I sent it for culture.  I ordered for recollection to try to get a better sense of if concomitant urine infection.     FINAL CLINICAL IMPRESSION(S) / ED DIAGNOSES   Final diagnoses:  Flank pain  Ureterolithiasis     Rx / DC Orders   ED Discharge Orders     None        Note:  This document was prepared using Dragon voice recognition software and may include unintentional dictation errors.    Buell Carmin, MD 07/04/23 1610    Buell Carmin, MD 07/04/23 [phone]    Buell Carmin, MD 07/04/23 [phone]

## 2023-07-04 NOTE — ED Notes (Signed)
 Pt to get medication for bp when nausea subsides. Pt agreeable with plan.

## 2023-07-04 NOTE — H&P (View-Only) (Signed)
 Urology Consult  I have been asked to see the patient by Dr. Alva Jewels, for evaluation and management of nephrolithiasis.  Chief Complaint: Left flank, LLQ pain  History of Present Illness: [name] is a 73 y.o. year old female with PMH chronic A-flutter s/p recent ablation on Eliquis , PAD s/p RLE angioplasty/thrombectomy/stent 2 weeks ago, CAD who presented to the ED overnight with reports of left flank and LLQ pain.  CT stone study showed left hydronephrosis secondary to a 9 mm left UPJ stone with perinephric stranding.  No other left renal stones.  She has some small nonobstructing right renal stones.  Admission labs notable for creatinine 0.97 (baseline 0.85); lactate 1.6; white count 9.1; and UA with no nitrites, >50 RBC/hpf, 21-50 WBC/hpf, 21-50 squamous epithelial cells/hpf, and rare bacteria.  She has been afebrile, VSS.  Urine culture pending, on antibiotics as below.  Today she reports no nausea and adequate pain control in the ED. No prior history of stones.  Anti-infectives (From admission, onward)    Start     Dose/Rate Route Frequency Ordered Stop   07/04/23 0800  cefTRIAXone  (ROCEPHIN ) 2 g in sodium chloride  0.9 % 100 mL IVPB        2 g 200 mL/hr over 30 Minutes Intravenous Every 24 hours 07/04/23 0729         Past Medical History:  Diagnosis Date   Anemia    Arthritis    Atrial flutter (HCC) 09/2015   a.) CHADS2VASc 2 (sex, HTN); b.) s/p DCCV 11/11/2015 (150 J); c.) s/p RFCA 01/17/2016; d.) rate/rhythm maintained without pharmacological intervention; no chronic anticoagulation.   Coronary artery calcification seen on CT scan    Diastolic dysfunction 08/23/2019   a.)  TTE 08/23/2019: EF 55-60%, severe LAE, mild-mod MR, PASP 46.2, G2DD   Dyspnea    Essential hypertension    GERD (gastroesophageal reflux disease)    Hepatic steatosis    Incomplete left bundle branch block (LBBB)    Pneumonia    as a child   Subclinical hypothyroidism    Tobacco abuse      Past Surgical History:  Procedure Laterality Date   ABDOMINAL HYSTERECTOMY     ABLATION OF DYSRHYTHMIC FOCUS  01/16/2016   ATRIAL FIBRILLATION ABLATION N/A 05/24/2023   Procedure: ATRIAL FIBRILLATION ABLATION;  Surgeon: Boyce Byes, MD;  Location: [facility] INVASIVE CV LAB;  Service: Cardiovascular;  Laterality: N/A;   ELECTROPHYSIOLOGIC STUDY N/A 11/11/2015   Procedure: CARDIOVERSION;  Surgeon: Devorah Fonder, MD;  Location: [facility] ORS;  Service: Cardiovascular;  Laterality: N/A;   ELECTROPHYSIOLOGIC STUDY N/A 01/16/2016   Procedure: A-Flutter Ablation;  Surgeon: Verona Goodwill, MD;  Location[facility] INVASIVE CV LAB;  Service: Cardiovascular;  Laterality: N/A;   LOWER EXTREMITY ANGIOGRAPHY Right 06/20/2023   Procedure: Lower Extremity Angiography;  Surgeon: Celso College, MD;  Location: [facility] INVASIVE CV LAB;  Service: Cardiovascular;  Laterality: Right;   TOTAL HIP ARTHROPLASTY Right 12/10/2021   Procedure: TOTAL HIP ARTHROPLASTY ANTERIOR APPROACH;  Surgeon: Venus Ginsberg, MD;  Location: [facility] ORS;  Service: Orthopedics;  Laterality: Right;    Home Medications:  No outpatient medications have been marked as taking for the 07/04/23 encounter [facility] Encounter).    Allergies:  Allergies  Allergen Reactions   Lisinopril  Swelling    Swelling of lips and face.    Tizanidine  Other (See Comments)    bradycardia   Amoxicillin  Itching and Rash   Latex Itching and Rash    Family History  Problem  Relation Age of Onset   Hypertension Mother    Diabetes Mother    Diabetes Father    Diabetes Brother    Hypertension Brother    Hypertension Brother    Heart attack Brother    Diabetes Brother    Breast cancer Neg Hx     Social History:  reports that she quit smoking about 7 years ago. Her smoking use included cigarettes. She has never used smokeless tobacco. She reports current alcohol use. She reports that she does not use drugs.  ROS: A complete review of systems was performed.  All  systems are negative except for pertinent findings as noted.  Physical Exam:  Vital signs in last 24 hours: Temp:  [97.6 F (36.4 C)] 97.6 F (36.4 C) (04/21 0349) Pulse Rate:  [52-54] 54 (04/21 0800) Resp:  [19-22] 19 (04/21 0800) BP: (189)/(63) 189/63 (04/21 0349) SpO2:  [92 %-99 %] 92 % (04/21 0800) Weight:  [78.1 kg] 78.1 kg (04/21 0348) Constitutional:  Alert and oriented, no acute distress HEENT: [place] AT, moist mucus membranes Cardiovascular: No clubbing, cyanosis, or edema Respiratory: Normal respiratory effort Skin: No rashes, bruises or suspicious lesions Neurologic: Grossly intact, no focal deficits, moving all 4 extremities Psychiatric: Normal mood and affect  Laboratory Data:  Recent Labs    07/04/23 0351  WBC 9.1  HGB 10.3*  HCT 33.7*   Recent Labs    07/04/23 0351  NA 141  K 3.7  CL 108  CO2 26  GLUCOSE 141*  BUN 15  CREATININE 0.97  CALCIUM  8.8*   Urinalysis    Component Value Date/Time   COLORURINE YELLOW (A) 07/04/2023 0539   APPEARANCEUR CLOUDY (A) 07/04/2023 0539   LABSPEC 1.014 07/04/2023 0539   PHURINE 6.0 07/04/2023 0539   GLUCOSEU NEGATIVE 07/04/2023 0539   HGBUR MODERATE (A) 07/04/2023 0539   BILIRUBINUR NEGATIVE 07/04/2023 0539   KETONESUR NEGATIVE 07/04/2023 0539   PROTEINUR NEGATIVE 07/04/2023 0539   NITRITE NEGATIVE 07/04/2023 0539   LEUKOCYTESUR LARGE (A) 07/04/2023 0539   Results for orders placed or performed during the hospital encounter of 06/23/23  Resp panel by RT-PCR (RSV, Flu A&B, Covid) Anterior Nasal Swab     Status: None   Collection Time: 06/23/23  2:34 AM   Specimen: Anterior Nasal Swab  Result Value Ref Range Status   SARS Coronavirus 2 by RT PCR NEGATIVE NEGATIVE Final    Comment: (NOTE) SARS-CoV-2 target nucleic acids are NOT DETECTED.  The SARS-CoV-2 RNA is generally detectable in upper respiratory specimens during the acute phase of infection. The lowest concentration of SARS-CoV-2 viral copies this assay  can detect is 138 copies/mL. A negative result does not preclude SARS-Cov-2 infection and should not be used as the sole basis for treatment or other patient management decisions. A negative result may occur with  improper specimen collection/handling, submission of specimen other than nasopharyngeal swab, presence of viral mutation(s) within the areas targeted by this assay, and inadequate number of viral copies(<138 copies/mL). A negative result must be combined with clinical observations, patient history, and epidemiological information. The expected result is Negative.  Fact Sheet for Patients:  BloggerCourse.com  Fact Sheet for Healthcare Providers:  SeriousBroker.it  This test is no t yet approved or cleared by the [place]  FDA and  has been authorized for detection and/or diagnosis of SARS-CoV-2 by FDA under an Emergency Use Authorization (EUA). This EUA will remain  in effect (meaning this test can be used) for the duration of the COVID-19  declaration under Section 564(b)(1) of the Act, 21 U.S.C.section 360bbb-3(b)(1), unless the authorization is terminated  or revoked sooner.       Influenza A by PCR NEGATIVE NEGATIVE Final   Influenza B by PCR NEGATIVE NEGATIVE Final    Comment: (NOTE) The Xpert Xpress SARS-CoV-2/FLU/RSV plus assay is intended as an aid in the diagnosis of influenza from Nasopharyngeal swab specimens and should not be used as a sole basis for treatment. Nasal washings and aspirates are unacceptable for Xpert Xpress SARS-CoV-2/FLU/RSV testing.  Fact Sheet for Patients: BloggerCourse.com  Fact Sheet for Healthcare Providers: SeriousBroker.it  This test is not yet approved or cleared by the [place]  FDA and has been authorized for detection and/or diagnosis of SARS-CoV-2 by FDA under an Emergency Use Authorization (EUA). This EUA will  remain in effect (meaning this test can be used) for the duration of the COVID-19 declaration under Section 564(b)(1) of the Act, 21 U.S.C. section 360bbb-3(b)(1), unless the authorization is terminated or revoked.     Resp Syncytial Virus by PCR NEGATIVE NEGATIVE Final    Comment: (NOTE) Fact Sheet for Patients: BloggerCourse.com  Fact Sheet for Healthcare Providers: SeriousBroker.it  This test is not yet approved or cleared by the [place]  FDA and has been authorized for detection and/or diagnosis of SARS-CoV-2 by FDA under an Emergency Use Authorization (EUA). This EUA will remain in effect (meaning this test can be used) for the duration of the COVID-19 declaration under Section 564(b)(1) of the Act, 21 U.S.C. section 360bbb-3(b)(1), unless the authorization is terminated or revoked.  Performed at [facility], [address] [place], [place] [address]     Radiologic Imaging: CT Renal Stone Study Result Date: 07/04/2023 CLINICAL DATA:  Abdominal pain/flank pain EXAM: CT ABDOMEN AND PELVIS WITHOUT CONTRAST TECHNIQUE: Multidetector CT imaging of the abdomen and pelvis was performed following the standard protocol without IV contrast. RADIATION DOSE REDUCTION: This exam was performed according to the departmental dose-optimization program which includes automated exposure control, adjustment of the mA and/or kV according to patient size and/or use of iterative reconstruction technique. COMPARISON:  Abdominal sonogram 09/11/2020 FINDINGS: Lower chest: Scarring noted within the lung bases. No pleural fluid or consolidative change. Cardiac enlargement. Coronary artery and aortic atherosclerotic calcifications. Hepatobiliary: No suspicious liver abnormality. Focal fatty deposition noted about the inferior aspect of the falciform ligament. Gallstones are identified. No gallbladder wall thickening or pericholecystic  inflammation. No bile duct dilatation. Pancreas: Unremarkable. No pancreatic ductal dilatation or surrounding inflammatory changes. Spleen: Normal in size without focal abnormality. Adrenals/Urinary Tract: Normal adrenal glands. 2 stones identified within the right renal collecting system. These measure up to 2 mm. No right-sided obstructive uropathy. Upper pole right kidney cyst measures 1.1 cm. No follow-up imaging recommended. Left-sided hydronephrosis with perinephric soft tissue stranding. At the right UPJ there is a stone measuring 9 mm, image 38/2 and coronal image 52/5. The left ureter is decompressed beyond the UPJ. Exam detail within the pelvis is diminished due to streak artifact from right hip arthroplasty device. Within this limitation, no focal bladder abnormality noted. Stomach/Bowel: Stomach appears normal. The appendix is visualized and is normal. No bowel wall thickening, inflammation or distension. Vascular/Lymphatic: Aortic atherosclerosis. No aneurysm. No abdominopelvic adenopathy. Reproductive: Status post hysterectomy. No adnexal masses. Other: No free fluid or fluid collections. Small fat containing umbilical hernia. No free fluid or fluid collections. No signs of pneumoperitoneum. Musculoskeletal: Status post right hip arthroplasty. Severe degenerative changes noted in the left hip. No acute abnormality. IMPRESSION:  1. Left-sided hydronephrosis with perinephric soft tissue stranding. There is a 9 mm stone at the left UPJ. 2. Nonobstructing right renal calculi. 3. Cholelithiasis. 4. Severe degenerative changes in the left hip. 5.  Aortic Atherosclerosis (ICD10-I70.0). Electronically Signed   By: Kimberley Penman M.D.   On: 07/04/2023 05:48   Assessment & Plan:  73 year old female with PMH chronic A-flutter s/p recent ablation on Eliquis , PAD s/p RLE angioplasty/thrombectomy/stent 2 weeks ago, and CAD admitted with an obstructing 9 mm left UPJ stone with possible UTI versus  pyelonephritis.  She is clinically stable on empiric antibiotics, question UTI/pyelonephritis versus urinary contamination.  I offered her left ureteral stent placement with Dr. Cherylene Corrente tomorrow for urinary decompression in the setting of her obstructing and possibly infected stone.  We discussed that she will require definitive stone management at a later date.  She expressed understanding and wishes to proceed.  Recommendations: -Okay for diet today - N.p.o. at midnight, sips with meds okay - Cystoscopy and left ureteral stent placement with Dr. Cherylene Corrente tomorrow - Outpatient left ureteroscopy with laser lithotripsy and stent exchange versus ESWL in 2 to 3 weeks after a course of culture appropriate antibiotics  Thank you for involving me in this patient's care, I will continue to follow along.  Pearson Reasons, PA-C 07/04/2023 8:55 AM

## 2023-07-04 NOTE — H&P (Signed)
 History and Physical    [name] QIO:[id number] DOB: 1951-03-01 DOA: 07/04/2023  PCP: [facility] (Confirm with patient/family/NH records and if not entered, this has to be entered at [facility] point of entry) Patient coming from: Home  I have personally briefly reviewed patient's old medical records in [facility] Link  Chief Complaint: Left flank pain  HPI: [name] is a 73 y.o. female with medical history significant of chronic A-flutter status post ablation recently, on Eliquis , PAD status post stenting, HTN, CAD, cigarette smoking, presented with sudden onset of left flank pain.  Patient woke up this morning with sudden onset of 10/10 left flank pain, sharp-like radiating to left groin area denied any fever chills feeling nausea and vomited x 1 stomach content, no dysuria no diarrhea.  Denied history of kidney stone.  History of PVD with worsening claudication and underwent angioplasty and stent was placed in right peroneal artery 2 weeks ago. ED Course: Afebrile, not tachycardia, blood pressure 180/60 O2 therapy 98% on room air.  CT abdomen pelvis showed left hydronephrosis with obstructing left UPJ stone 9 mm, left Perinephric soft tissue stranding.  Blood work showed WBC 8.1 hemoglobin 10.3 BUN 15 creatinine 0.9 K3.7 glucose 141.  Patient was given multiple rounds of morphine  and Zofran  to control her symptoms. Review of Systems: As per HPI otherwise 14 point review of systems negative.    Past Medical History:  Diagnosis Date   Anemia    Arthritis    Atrial flutter (HCC) 09/2015   a.) CHADS2VASc 2 (sex, HTN); b.) s/p DCCV 11/11/2015 (150 J); c.) s/p RFCA 01/17/2016; d.) rate/rhythm maintained without pharmacological intervention; no chronic anticoagulation.   Coronary artery calcification seen on CT scan    Diastolic dysfunction 08/23/2019   a.)  TTE 08/23/2019: EF 55-60%, severe LAE, mild-mod MR, PASP 46.2, G2DD   Dyspnea    Essential hypertension     GERD (gastroesophageal reflux disease)    Hepatic steatosis    Incomplete left bundle branch block (LBBB)    Pneumonia    as a child   Subclinical hypothyroidism    Tobacco abuse     Past Surgical History:  Procedure Laterality Date   ABDOMINAL HYSTERECTOMY     ABLATION OF DYSRHYTHMIC FOCUS  01/16/2016   ATRIAL FIBRILLATION ABLATION N/A 05/24/2023   Procedure: ATRIAL FIBRILLATION ABLATION;  Surgeon: Boyce Byes, MD;  [facility]: [facility] INVAS[facility] CV LAB;  Service: Cardiovascular;  Laterality: N/A;   ELECTROPHYSIOLOGIC STUDY N/A 11/11/2015   Procedure: CARDIOVERSION;  Surgeon: Devorah Fonder, MD;  Location: [facility] ORS;  Service: Cardiovascular;  Laterality: N/A;   ELECTROPHYSIOLOGIC STUDY N/A 01/16/2016   Procedure: A-Flutter Ablation;  Surgeon: Verona Goodwill, MD;  Location: [facility] INVASIVE CV LAB;  Service: Cardiovascular;  Laterality: N/A;   LOWER EXTREMITY ANGIOGRAPHY Right 06/20/2023   Procedure: Lower Extremity Angiography;  Surgeon: Celso College, MD;  Location: [facility] INVASIVE CV LAB;  Service: Cardiovascular;  Laterality: Right;   TOTAL HIP ARTHROPLASTY Right 12/10/2021   Procedure: TOTAL HIP ARTHROPLASTY ANTERIOR APPROACH;  Surgeon: Venus Ginsberg, MD;  Location: [facility] ORS;  Service: Orthopedics;  Laterality: Right;     reports that she quit smoking about 7 years ago. Her smoking use included cigarettes. She has never used smokeless tobacco. She reports current alcohol use. She reports that she does not use drugs.  Allergies  Allergen Reactions   Lisinopril  Swelling    Swelling of lips and face.    Tizanidine  Other (See Comments)  bradycardia   Amoxicillin  Itching and Rash   Latex Itching and Rash    Family History  Problem Relation Age of Onset   Hypertension Mother    Diabetes Mother    Diabetes Father    Diabetes Brother    Hypertension Brother    Hypertension Brother    Heart attack Brother    Diabetes Brother    Breast cancer Neg Hx      Prior to Admission  medications   Medication Sig Start Date End Date Taking? Authorizing Provider  acetaminophen  (TYLENOL ) 650 MG CR tablet Take 650-1,300 mg by mouth every 8 (eight) hours as needed for pain.    [provider]  albuterol  (VENTOLIN  HFA) 108 (90 Base) MCG/ACT inhaler Inhale 1-2 puffs into the lungs every 4 (four) hours as needed for wheezing or shortness of breath. Inhale 2-4 puffs by mouth every 4 hours as needed for wheezing, cough, and/or shortness of breath 06/08/23   Althia Atlas, MD  amLODipine  (NORVASC ) 10 MG tablet Take 1 tablet (10 mg total) by mouth daily. 06/24/23 06/23/24  Riddle, Suzann, NP  apixaban  (ELIQUIS ) 5 MG TABS tablet Take 1 tablet (5 mg total) by mouth 2 (two) times daily. 06/13/23   Riddle, Suzann, NP  ascorbic acid (VITAMIN C) 500 MG tablet Take 500 mg by mouth daily.    [provider]  aspirin  EC 81 MG tablet Take 1 tablet (81 mg total) by mouth daily. Swallow whole. 06/20/23 06/19/24  Celso College, MD  atorvastatin  (LIPITOR) 10 MG tablet Take 1 tablet (10 mg total) by mouth daily. 06/20/23 06/19/24  Celso College, MD  benzonatate  (TESSALON  PERLES) 100 MG capsule Take 1-2 tabs TID prn cough 06/23/23   Menshew, Jenise V Bacon, PA-C  cholecalciferol  (VITAMIN D ) 1000 units tablet Take 1,000 Units by mouth daily.    [provider]  fluticasone  furoate-vilanterol (BREO ELLIPTA ) 100-25 MCG/ACT AEPB Inhale 1 puff into the lungs daily. 06/08/23 09/06/23  Althia Atlas, MD  levothyroxine  (SYNTHROID ) 100 MCG tablet Take 100 mcg by mouth daily before breakfast.    [provider]  lidocaine  (XYLOCAINE ) 5 % ointment Apply 1 Application topically as needed. 05/17/23   Velma Ghazi, DPM  loratadine  (CLARITIN ) 10 MG tablet Take 1 tablet (10 mg total) by mouth every evening for 10 days. 06/08/23 06/18/23  Althia Atlas, MD  losartan  (COZAAR ) 100 MG tablet Take 1 tablet (100 mg total) by mouth daily. 09/23/22   Furth, Cadence H, PA-C  pantoprazole  (PROTONIX ) 40 MG tablet Take  1 tablet (40 mg total) by mouth daily. 05/24/23 07/08/23  Riddle, Suzann, NP    Physical Exam: Vitals:   07/04/23 0348 07/04/23 0349 07/04/23 0800  BP:  (!) 189/63   Pulse:  (!) 52 (!) 54  Resp:  (!) 22 19  Temp:  97.6 F (36.4 C)   TempSrc:  Oral   SpO2:  99% 92%  Weight: 78.1 kg    Height: 5\' 5"  (1.651 m)      Constitutional: NAD, calm, comfortable Vitals:   07/04/23 0348 07/04/23 0349 07/04/23 0800  BP:  (!) 189/63   Pulse:  (!) 52 (!) 54  Resp:  (!) 22 19  Temp:  97.6 F (36.4 C)   TempSrc:  Oral   SpO2:  99% 92%  Weight: 78.1 kg    Height: 5\' 5"  (1.651 m)     Eyes: PERRL, lids and conjunctivae normal ENMT: Mucous membranes are moist. Posterior pharynx clear of any exudate  or lesions.Normal dentition.  Neck: normal, supple, no masses, no thyromegaly Respiratory: clear to auscultation bilaterally, no wheezing, no crackles. Normal respiratory effort. No accessory muscle use.  Cardiovascular: Regular rate and rhythm, no murmurs / rubs / gallops. No extremity edema. 2+ pedal pulses. No carotid bruits.  Abdomen: left CVA tenderness, no masses palpated. No hepatosplenomegaly. Bowel sounds positive.  Musculoskeletal: no clubbing / cyanosis. No joint deformity upper and lower extremities. Good ROM, no contractures. Normal muscle tone.  Skin: no rashes, lesions, ulcers. No induration Neurologic: CN 2-12 grossly intact. Sensation intact, DTR normal. Strength 5/5 in all 4.  Psychiatric: Normal judgment and insight. Alert and oriented x 3. Normal mood.    Labs on Admission: I have personally reviewed following labs and imaging studies  CBC: Recent Labs  Lab 07/04/23 0351  WBC 9.1  NEUTROABS 6.8  HGB 10.3*  HCT 33.7*  MCV 76.6*  PLT 295   Basic Metabolic Panel: Recent Labs  Lab 07/04/23 0351  NA 141  K 3.7  CL 108  CO2 26  GLUCOSE 141*  BUN 15  CREATININE 0.97  CALCIUM  8.8*   GFR: Estimated Creatinine Clearance: 55.7 mL/min (by C-G formula based on SCr of  0.97 mg/dL). Liver Function Tests: Recent Labs  Lab 07/04/23 0351  AST 22  ALT 12  ALKPHOS 53  BILITOT 0.5  PROT 6.8  ALBUMIN 3.8   Recent Labs  Lab 07/04/23 0351  LIPASE 23   No results for input(s): "AMMONIA" in the last 168 hours. Coagulation Profile: No results for input(s): "INR", "PROTIME" in the last 168 hours. Cardiac Enzymes: No results for input(s): "CKTOTAL", "CKMB", "CKMBINDEX", "TROPONINI" in the last 168 hours. BNP (last 3 results) No results for input(s): "PROBNP" in the last 8760 hours. HbA1C: No results for input(s): "HGBA1C" in the last 72 hours. CBG: No results for input(s): "GLUCAP" in the last 168 hours. Lipid Profile: No results for input(s): "CHOL", "HDL", "LDLCALC", "TRIG", "CHOLHDL", "LDLDIRECT" in the last 72 hours. Thyroid  Function Tests: No results for input(s): "TSH", "T4TOTAL", "FREET4", "T3FREE", "THYROIDAB" in the last 72 hours. Anemia Panel: No results for input(s): "VITAMINB12", "FOLATE", "FERRITIN", "TIBC", "IRON", "RETICCTPCT" in the last 72 hours. Urine analysis:    Component Value Date/Time   COLORURINE YELLOW (A) 07/04/2023 0539   APPEARANCEUR CLOUDY (A) 07/04/2023 0539   LABSPEC 1.014 07/04/2023 0539   PHURINE 6.0 07/04/2023 0539   GLUCOSEU NEGATIVE 07/04/2023 0539   HGBUR MODERATE (A) 07/04/2023 0539   BILIRUBINUR NEGATIVE 07/04/2023 0539   KETONESUR NEGATIVE 07/04/2023 0539   PROTEINUR NEGATIVE 07/04/2023 0539   NITRITE NEGATIVE 07/04/2023 0539   LEUKOCYTESUR LARGE (A) 07/04/2023 0539    Radiological Exams on Admission: CT Renal Stone Study Result Date: 07/04/2023 CLINICAL DATA:  Abdominal pain/flank pain EXAM: CT ABDOMEN AND PELVIS WITHOUT CONTRAST TECHNIQUE: Multidetector CT imaging of the abdomen and pelvis was performed following the standard protocol without IV contrast. RADIATION DOSE REDUCTION: This exam was performed according to the departmental dose-optimization program which includes automated exposure control,  adjustment of the mA and/or kV according to patient size and/or use of iterative reconstruction technique. COMPARISON:  Abdominal sonogram 09/11/2020 FINDINGS: Lower chest: Scarring noted within the lung bases. No pleural fluid or consolidative change. Cardiac enlargement. Coronary artery and aortic atherosclerotic calcifications. Hepatobiliary: No suspicious liver abnormality. Focal fatty deposition noted about the inferior aspect of the falciform ligament. Gallstones are identified. No gallbladder wall thickening or pericholecystic inflammation. No bile duct dilatation. Pancreas: Unremarkable. No pancreatic ductal dilatation or surrounding  inflammatory changes. Spleen: Normal in size without focal abnormality. Adrenals/Urinary Tract: Normal adrenal glands. 2 stones identified within the right renal collecting system. These measure up to 2 mm. No right-sided obstructive uropathy. Upper pole right kidney cyst measures 1.1 cm. No follow-up imaging recommended. Left-sided hydronephrosis with perinephric soft tissue stranding. At the right UPJ there is a stone measuring 9 mm, image 38/2 and coronal image 52/5. The left ureter is decompressed beyond the UPJ. Exam detail within the pelvis is diminished due to streak artifact from right hip arthroplasty device. Within this limitation, no focal bladder abnormality noted. Stomach/Bowel: Stomach appears normal. The appendix is visualized and is normal. No bowel wall thickening, inflammation or distension. Vascular/Lymphatic: Aortic atherosclerosis. No aneurysm. No abdominopelvic adenopathy. Reproductive: Status post hysterectomy. No adnexal masses. Other: No free fluid or fluid collections. Small fat containing umbilical hernia. No free fluid or fluid collections. No signs of pneumoperitoneum. Musculoskeletal: Status post right hip arthroplasty. Severe degenerative changes noted in the left hip. No acute abnormality. IMPRESSION: 1. Left-sided hydronephrosis with perinephric  soft tissue stranding. There is a 9 mm stone at the left UPJ. 2. Nonobstructing right renal calculi. 3. Cholelithiasis. 4. Severe degenerative changes in the left hip. 5.  Aortic Atherosclerosis (ICD10-I70.0). Electronically Signed   By: Kimberley Penman M.D.   On: 07/04/2023 05:48    EKG: Independently reviewed.  Sinus bradycardia, no acute ST changes.  Assessment/Plan Principal Problem:   Nephrolithiasis Active Problems:   Acute pyelonephritis   Renal colic on left side  (please populate well all problems here in Problem List. (For example, if patient is on BP meds at home and you resume or decide to hold them, it is a problem that needs to be her. Same for CAD, COPD, HLD and so on)  Acute left-sided obstructive ureteral stone with acute left-sided obstructive uropathy and hydronephrosis - Discussed with on-call urology Dr. Cherylene Corrente, who will see patient today -NPO in case OR today -Will hold off Eliquis  today - IVF, pain control and as needed Zofran  for symptoms  Probably complicated UTI Possible left-sided pyelonephritis - Secondary to left-sided obstructive ureteral stone - Ceftriaxone  while waiting for urine culture  PVD with recent stenting - No acute concern, continue aspirin  and statin  HTN, uncontrolled - Resume home BP meds including amlodipine  and losartan   Hypothyroidism - Continue Synthroid   PAF status post ablation - In sinus rhythm - Hold off Eliquis  today  DVT prophylaxis: Lovenox  Code Status: Full code Family Communication:  Disposition Plan: Expect less than 2 midnight hospital stay Consults called: Urology Admission status: Medsurg obs   [place] MD [facility] Pager [phone]  07/04/2023, 8:39 AM

## 2023-07-05 ENCOUNTER — Inpatient Hospital Stay

## 2023-07-05 ENCOUNTER — Encounter
Admission: EM | Disposition: A | Discharge status: Home / Self Care | Payer: Self-pay | Source: Home / Self Care | Attending: Hospitalist

## 2023-07-05 DIAGNOSIS — Z87891 Personal history of nicotine dependence: Secondary | ICD-10-CM | POA: Diagnosis not present

## 2023-07-05 DIAGNOSIS — E876 Hypokalemia: Secondary | ICD-10-CM | POA: Diagnosis not present

## 2023-07-05 DIAGNOSIS — Z96641 Presence of right artificial hip joint: Secondary | ICD-10-CM | POA: Diagnosis present

## 2023-07-05 DIAGNOSIS — Z79899 Other long term (current) drug therapy: Secondary | ICD-10-CM | POA: Diagnosis not present

## 2023-07-05 DIAGNOSIS — I10 Essential (primary) hypertension: Secondary | ICD-10-CM | POA: Diagnosis present

## 2023-07-05 DIAGNOSIS — I2510 Atherosclerotic heart disease of native coronary artery without angina pectoris: Secondary | ICD-10-CM | POA: Diagnosis present

## 2023-07-05 DIAGNOSIS — N12 Tubulo-interstitial nephritis, not specified as acute or chronic: Secondary | ICD-10-CM | POA: Diagnosis not present

## 2023-07-05 DIAGNOSIS — N201 Calculus of ureter: Secondary | ICD-10-CM | POA: Diagnosis present

## 2023-07-05 DIAGNOSIS — N200 Calculus of kidney: Secondary | ICD-10-CM | POA: Diagnosis not present

## 2023-07-05 DIAGNOSIS — Z888 Allergy status to other drugs, medicaments and biological substances status: Secondary | ICD-10-CM | POA: Diagnosis not present

## 2023-07-05 DIAGNOSIS — Z7901 Long term (current) use of anticoagulants: Secondary | ICD-10-CM | POA: Diagnosis not present

## 2023-07-05 DIAGNOSIS — Z880 Allergy status to penicillin: Secondary | ICD-10-CM | POA: Diagnosis not present

## 2023-07-05 DIAGNOSIS — N136 Pyonephrosis: Secondary | ICD-10-CM | POA: Diagnosis present

## 2023-07-05 DIAGNOSIS — Z8249 Family history of ischemic heart disease and other diseases of the circulatory system: Secondary | ICD-10-CM | POA: Diagnosis not present

## 2023-07-05 DIAGNOSIS — Z79890 Hormone replacement therapy (postmenopausal): Secondary | ICD-10-CM | POA: Diagnosis not present

## 2023-07-05 DIAGNOSIS — Z91040 Latex allergy status: Secondary | ICD-10-CM | POA: Diagnosis not present

## 2023-07-05 DIAGNOSIS — I480 Paroxysmal atrial fibrillation: Secondary | ICD-10-CM | POA: Diagnosis present

## 2023-07-05 DIAGNOSIS — Z7982 Long term (current) use of aspirin: Secondary | ICD-10-CM | POA: Diagnosis not present

## 2023-07-05 DIAGNOSIS — Z833 Family history of diabetes mellitus: Secondary | ICD-10-CM | POA: Diagnosis not present

## 2023-07-05 DIAGNOSIS — N390 Urinary tract infection, site not specified: Secondary | ICD-10-CM | POA: Diagnosis not present

## 2023-07-05 DIAGNOSIS — I739 Peripheral vascular disease, unspecified: Secondary | ICD-10-CM | POA: Diagnosis present

## 2023-07-05 DIAGNOSIS — E039 Hypothyroidism, unspecified: Secondary | ICD-10-CM | POA: Diagnosis present

## 2023-07-05 DIAGNOSIS — Z7951 Long term (current) use of inhaled steroids: Secondary | ICD-10-CM | POA: Diagnosis not present

## 2023-07-05 DIAGNOSIS — E785 Hyperlipidemia, unspecified: Secondary | ICD-10-CM | POA: Diagnosis present

## 2023-07-05 DIAGNOSIS — N1330 Unspecified hydronephrosis: Secondary | ICD-10-CM | POA: Diagnosis present

## 2023-07-05 DIAGNOSIS — Z90710 Acquired absence of both cervix and uterus: Secondary | ICD-10-CM | POA: Diagnosis not present

## 2023-07-05 DIAGNOSIS — Z87442 Personal history of urinary calculi: Secondary | ICD-10-CM | POA: Diagnosis not present

## 2023-07-05 DIAGNOSIS — K219 Gastro-esophageal reflux disease without esophagitis: Secondary | ICD-10-CM | POA: Diagnosis present

## 2023-07-05 HISTORY — PX: CYSTOSCOPY WITH STENT PLACEMENT: SHX5790

## 2023-07-05 LAB — URINALYSIS, W/ REFLEX TO CULTURE (INFECTION SUSPECTED)
Bacteria, UA: NONE SEEN
Bilirubin Urine: NEGATIVE
Glucose, UA: 50 mg/dL — AB
Ketones, ur: NEGATIVE mg/dL
Nitrite: NEGATIVE
Protein, ur: 100 mg/dL — AB
Specific Gravity, Urine: 1.004 — ABNORMAL LOW (ref 1.005–1.030)
pH: 6 (ref 5.0–8.0)

## 2023-07-05 LAB — URINE CULTURE

## 2023-07-05 SURGERY — CYSTOSCOPY, WITH STENT INSERTION
Anesthesia: General | Site: Ureter | Laterality: Left

## 2023-07-05 MED ORDER — OXYCODONE HCL 5 MG/5ML PO SOLN: 5.0000 mg | Freq: Once | ORAL | Status: AC | PRN

## 2023-07-05 MED ORDER — OXYCODONE HCL 5 MG PO TABS
5.0000 mg | ORAL_TABLET | ORAL | Status: DC | PRN
  Administered 2023-07-06: 5 mg via ORAL
  Administered 2023-07-07: 10 mg via ORAL
  Filled 2023-07-05: qty 2
  Filled 2023-07-05: qty 1

## 2023-07-05 MED ORDER — STERILE WATER FOR IRRIGATION IR SOLN
Status: DC | PRN
  Administered 2023-07-05: 500 mL

## 2023-07-05 MED ORDER — DEXMEDETOMIDINE HCL IN NACL 80 MCG/20ML IV SOLN
INTRAVENOUS | Status: DC | PRN
Start: 2023-07-05 — End: 2023-07-05
  Administered 2023-07-05: 8 ug via INTRAVENOUS

## 2023-07-05 MED ORDER — PROPOFOL 10 MG/ML IV BOLUS
INTRAVENOUS | Status: AC
  Filled 2023-07-05: qty 20

## 2023-07-05 MED ORDER — PROPOFOL 500 MG/50ML IV EMUL
INTRAVENOUS | Status: DC | PRN
  Administered 2023-07-05: 50 ug via INTRAVENOUS
  Administered 2023-07-05: 150 ug/kg/min via INTRAVENOUS
  Administered 2023-07-05: 40 ug via INTRAVENOUS

## 2023-07-05 MED ORDER — LIDOCAINE HCL (PF) 2 % IJ SOLN
INTRAMUSCULAR | Status: AC
  Filled 2023-07-05: qty 5

## 2023-07-05 MED ORDER — PROPOFOL 1000 MG/100ML IV EMUL
INTRAVENOUS | Status: AC
  Filled 2023-07-05: qty 100

## 2023-07-05 MED ORDER — IOHEXOL 180 MG/ML  SOLN
INTRAMUSCULAR | Status: DC | PRN
  Administered 2023-07-05: 10 mL

## 2023-07-05 MED ORDER — SODIUM CHLORIDE 0.9 % IR SOLN
Status: DC | PRN
  Administered 2023-07-05: 3000 mL

## 2023-07-05 MED ORDER — OXYCODONE HCL 5 MG PO TABS
ORAL_TABLET | ORAL | Status: AC
  Filled 2023-07-05: qty 1

## 2023-07-05 MED ORDER — ONDANSETRON HCL 4 MG/2ML IJ SOLN
INTRAMUSCULAR | Status: DC | PRN
  Administered 2023-07-05: 4 mg via INTRAVENOUS

## 2023-07-05 MED ORDER — FENTANYL CITRATE (PF) 100 MCG/2ML IJ SOLN: 25.0000 ug | INTRAMUSCULAR | Status: DC | PRN

## 2023-07-05 MED ORDER — FENTANYL CITRATE (PF) 100 MCG/2ML IJ SOLN
INTRAMUSCULAR | Status: AC
Start: 2023-07-05 — End: ?
  Filled 2023-07-05: qty 2

## 2023-07-05 MED ORDER — OXYCODONE HCL 5 MG PO TABS
5.0000 mg | ORAL_TABLET | Freq: Once | ORAL | Status: AC | PRN
  Administered 2023-07-05: 5 mg via ORAL

## 2023-07-05 MED ORDER — PROCHLORPERAZINE EDISYLATE 10 MG/2ML IJ SOLN
5.0000 mg | Freq: Four times a day (QID) | INTRAMUSCULAR | Status: DC | PRN
  Administered 2023-07-07: 5 mg via INTRAVENOUS
  Filled 2023-07-05 (×2): qty 1

## 2023-07-05 MED ORDER — FENTANYL CITRATE (PF) 100 MCG/2ML IJ SOLN
INTRAMUSCULAR | Status: DC | PRN
  Administered 2023-07-05: 50 ug via INTRAVENOUS

## 2023-07-05 MED ORDER — DEXAMETHASONE SODIUM PHOSPHATE 10 MG/ML IJ SOLN
INTRAMUSCULAR | Status: DC | PRN
Start: 2023-07-05 — End: 2023-07-05
  Administered 2023-07-05: 10 mg via INTRAVENOUS

## 2023-07-05 MED ORDER — SODIUM CHLORIDE FLUSH 0.9 % IV SOLN
INTRAVENOUS | Status: AC
  Filled 2023-07-05: qty 10

## 2023-07-05 MED ORDER — SODIUM CHLORIDE 0.9 % IV SOLN: INTRAVENOUS | Status: AC

## 2023-07-05 SURGICAL SUPPLY — 14 items
BAG DRAIN SIEMENS DORNER NS (MISCELLANEOUS) ×1 IMPLANT
BRUSH SCRUB EZ 4% CHG (MISCELLANEOUS) ×1 IMPLANT
CATH URETL OPEN END 6X70 (CATHETERS) ×1 IMPLANT
GLOVE BIOGEL PI IND STRL 7.5 (GLOVE) ×1 IMPLANT
GOWN STRL REUS W/ TWL XL LVL3 (GOWN DISPOSABLE) ×1 IMPLANT
GUIDEWIRE STR DUAL SENSOR (WIRE) ×1 IMPLANT
KIT TURNOVER CYSTO (KITS) ×1 IMPLANT
PACK CYSTO AR (MISCELLANEOUS) ×1 IMPLANT
SET CYSTO W/LG BORE CLAMP LF (SET/KITS/TRAYS/PACK) ×1 IMPLANT
SOL .9 NS 3000ML IRR UROMATIC (IV SOLUTION) ×1 IMPLANT
STENT URET 6FRX24 CONTOUR (STENTS) IMPLANT
STENT URET 6FRX26 CONTOUR (STENTS) IMPLANT
SURGILUBE 2OZ TUBE FLIPTOP (MISCELLANEOUS) ×1 IMPLANT
WATER STERILE IRR 500ML POUR (IV SOLUTION) ×1 IMPLANT

## 2023-07-05 NOTE — Plan of Care (Signed)
  Problem: Education: Goal: Knowledge of General Education information will improve Description: Including pain rating scale, medication(s)/side effects and non-pharmacologic comfort measures Outcome: Progressing   Problem: Clinical Measurements: Goal: Ability to maintain clinical measurements within normal limits will improve Outcome: Progressing   Problem: Activity: Goal: Risk for activity intolerance will decrease Outcome: Progressing   Problem: Nutrition: Goal: Adequate nutrition will be maintained Outcome: Progressing   Problem: Elimination: Goal: Will not experience complications related to bowel motility Outcome: Progressing Goal: Will not experience complications related to urinary retention Outcome: Progressing   Problem: Pain Managment: Goal: General experience of comfort will improve and/or be controlled Outcome: Progressing   Problem: Safety: Goal: Ability to remain free from injury will improve Outcome: Progressing

## 2023-07-05 NOTE — Care Management Obs Status (Signed)
 MEDICARE OBSERVATION STATUS NOTIFICATION   Patient Details  Name: [name] MRN: [id number] Date of Birth: August 14, 1950   Medicare Observation Status Notification Given:  Rudolph Cost, CMA 07/05/2023, 9:47 AM

## 2023-07-05 NOTE — Anesthesia Preprocedure Evaluation (Signed)
 Anesthesia Evaluation  Patient identified by MRN, date of birth, ID band Patient awake    Reviewed: Allergy & Precautions, NPO status , Patient's Chart, lab work & pertinent test results  History of Anesthesia Complications Negative for: history of anesthetic complications  Airway Mallampati: II  TM Distance: >3 FB Neck ROM: Full    Dental  (+) Poor Dentition, Missing, Dental Advisory Given   Pulmonary neg pulmonary ROS, COPD,  COPD inhaler, former smoker   Pulmonary exam normal breath sounds clear to auscultation       Cardiovascular hypertension, Pt. on medications (-) angina + CAD (calcifications)  negative cardio ROS Normal cardiovascular exam+ dysrhythmias Atrial Fibrillation  Rhythm:Irregular Rate:Normal  10/2022 ECHO: EF 55 to 60%. 1. The LV has normal function, no regional wall motion abnormalities. Grade II diastolic dysfunction (pseudonormalization). Elevated left atrial pressure. The  average left ventricular global longitudinal strain is -12.7 %. The global  longitudinal strain is abnormal.   2. RVF is normal. The right ventricular size is normal. Mildly increased right ventricular wall thickness. There is normal pulmonary artery systolic pressure.   3. Left atrial size was severely dilated.   4. Right atrial size was moderately dilated.   5. A small pericardial effusion is present. The pericardial effusion is localized near the right atrium and anterior to the right ventricle.   6. The MV is grossly normal. Mild to moderate mitral valve regurgitation. No evidence of mitral stenosis.   7. The aortic valve was not well visualized. Aortic valve regurgitation is not visualized. No aortic stenosis is present.     Neuro/Psych negative neurological ROS  negative psych ROS   GI/Hepatic negative GI ROS, Neg liver ROS,GERD  Medicated and Controlled,,  Endo/Other  negative endocrine ROSHypothyroidism    Renal/GU       Musculoskeletal   Abdominal   Peds  Hematology negative hematology ROS (+) eliquis    Anesthesia Other Findings Past Medical History: No date: Anemia No date: Arthritis 09/2015: Atrial flutter (HCC)     Comment:  a.) CHADS2VASc 2 (sex, HTN); b.) s/p DCCV 11/11/2015               (150 J); c.) s/p RFCA 01/17/2016; d.) rate/rhythm               maintained without pharmacological intervention; no               chronic anticoagulation. No date: Coronary artery calcification seen on CT scan 08/23/2019: Diastolic dysfunction     Comment:  a.)  TTE 08/23/2019: EF 55-60%, severe LAE, mild-mod MR,              PASP 46.2, G2DD No date: Dyspnea No date: Essential hypertension No date: GERD (gastroesophageal reflux disease) No date: Hepatic steatosis No date: Incomplete left bundle branch block (LBBB) No date: Pneumonia     Comment:  as a child No date: Subclinical hypothyroidism No date: Tobacco abuse  Past Surgical History: No date: ABDOMINAL HYSTERECTOMY 01/16/2016: ABLATION OF DYSRHYTHMIC FOCUS 05/24/2023: ATRIAL FIBRILLATION ABLATION; N/A     Comment:  Procedure: ATRIAL FIBRILLATION ABLATION;  Surgeon:               Boyce Byes, MD;  Location: [facility] INVASIVE CV LAB;                Service: Cardiovascular;  Laterality: N/A; 11/11/2015: ELECTROPHYSIOLOGIC STUDY; N/A     Comment:  Procedure: CARDIOVERSION;  Surgeon: Devorah Fonder,  MD;  Location: [facility] ORS;  Service: Cardiovascular;                Laterality: N/A; 01/16/2016: ELECTROPHYSIOLOGIC STUDY; N/A     Comment:  Procedure: A-Flutter Ablation;  Surgeon: Verona Goodwill,              MD;  Location: [facility] INVASIVE CV LAB;  Service:               Cardiovascular;  Laterality: N/A; 06/20/2023: LOWER EXTREMITY ANGIOGRAPHY; Right     Comment:  Procedure: Lower Extremity Angiography;  Surgeon: Celso College, MD;  Location: [facility] INVASIVE CV LAB;  Service:               Cardiovascular;  Laterality:  Right; 12/10/2021: TOTAL HIP ARTHROPLASTY; Right     Comment:  Procedure: TOTAL HIP ARTHROPLASTY ANTERIOR APPROACH;                Surgeon: Venus Ginsberg, MD;  Location: [facility] ORS;                Service: Orthopedics;  Laterality: Right;  BMI    Body Mass Index: 29.29 kg/m      Reproductive/Obstetrics negative OB ROS                             Anesthesia Physical Anesthesia Plan  ASA: 3  Anesthesia Plan: General   Post-op Pain Management:    Induction: Intravenous  PONV Risk Score and Plan: 3 and Ondansetron  and Dexamethasone   Airway Management Planned: LMA  Additional Equipment:   Intra-op Plan:   Post-operative Plan: Extubation in OR  Informed Consent: I have reviewed the patients History and Physical, chart, labs and discussed the procedure including the risks, benefits and alternatives for the proposed anesthesia with the patient or authorized representative who has indicated his/her understanding and acceptance.     Dental Advisory Given  Plan Discussed with: Anesthesiologist, CRNA and Surgeon  Anesthesia Plan Comments: (Patient consented for risks of anesthesia including but not limited to:  - adverse reactions to medications - damage to eyes, teeth, lips or other oral mucosa - nerve damage due to positioning  - sore throat or hoarseness - Damage to heart, brain, nerves, lungs, other parts of body or loss of life  Patient voiced understanding and assent.)       Anesthesia Quick Evaluation

## 2023-07-05 NOTE — Plan of Care (Signed)

## 2023-07-05 NOTE — Transfer of Care (Signed)
 Immediate Anesthesia Transfer of Care Note  Patient: [name]  Procedure(s) Performed: CYSTOSCOPY, WITH STENT INSERTION (Left: Ureter)  Patient Location: PACU  Anesthesia Type:MAC  Level of Consciousness: awake  Airway & Oxygen Therapy: Patient Spontanous Breathing and Patient connected to face mask oxygen  Post-op Assessment: Report given to RN and Post -op Vital signs reviewed and stable  Post vital signs: Reviewed and stable  Last Vitals:  Vitals Value Taken Time  BP 148/63 07/05/23 1340  Temp 36.7 C 07/05/23 1340  Pulse 76 07/05/23 1342  Resp 23 07/05/23 1342  SpO2 94 % 07/05/23 1342  Vitals shown include unfiled device data.  Last Pain:  Vitals:   07/05/23 1147  TempSrc: Tympanic  PainSc: 2       Patients Stated Pain Goal: 0 (07/04/23 1516)  Complications: There were no known notable events for this encounter.

## 2023-07-05 NOTE — Progress Notes (Signed)
  PROGRESS NOTE    [name]  ZOX:[id number] DOB: 1950-12-23 DOA: 07/04/2023 PCP: [facility]  134A/134A-AA  LOS: 0 days   Brief hospital course:   Assessment & Plan: [name] is a 73 y.o. female with medical history significant of chronic A-flutter status post ablation recently, on Eliquis , PAD status post stenting, HTN, CAD, cigarette smoking, presented with sudden onset of left flank pain.    Acute left-sided obstructive ureteral stone with acute left-sided obstructive uropathy and hydronephrosis --stent placement today --cont MIVF   Probably complicated UTI Possible left-sided pyelonephritis - Secondary to left-sided obstructive ureteral stone --urine cx obtained on presentation with multiple species.  Urine cx collected again during cystoscopy  --cont ceftriaxone    PVD with recent stenting - No acute concern --cont ASA and statin   HTN, uncontrolled --cont amlodipine  and losartan    Hypothyroidism - Continue Synthroid    PAF status post ablation - In sinus rhythm --hold Eliquis    DVT prophylaxis: Lovenox  SQ Code Status: Full code  Family Communication: husband updated at bedside today Level of care: Med-Surg Dispo:   The patient is from: home Anticipated d/c is to: home Anticipated d/c date is: 1-2 days   Subjective and Interval History:  Pt had nausea with pain.  Underwent stent placement today.   Objective: Vitals:   07/05/23 1403 07/05/23 1415 07/05/23 1440 07/05/23 1525  BP:  (!) 151/64 (!) 166/97 128/60  Pulse: 68 68 75 70  Resp: 20 19 16 16   Temp:   97.7 F (36.5 C)   TempSrc:      SpO2: 92% 93% 93% 100%  Weight:      Height:        Intake/Output Summary (Last 24 hours) at 07/05/2023 1740 Last data filed at 07/05/2023 1333 Gross per 24 hour  Intake 100 ml  Output --  Net 100 ml   Filed Weights   07/04/23 0348 07/05/23 1147  Weight: 78.1 kg 79.8 kg    Examination:   Constitutional: NAD, AAOx3 HEENT:  conjunctivae and lids normal, EOMI CV: No cyanosis.   RESP: normal respiratory effort, on RA Neuro: II - XII grossly intact.   Psych: Normal mood and affect.  Appropriate judgement and reason   Data Reviewed: I have personally reviewed labs and imaging studies  Time spent: 50 minutes  Garrison Kanner, MD [facility] If 7PM-7AM, please contact night-coverage 07/05/2023, 5:40 PM

## 2023-07-05 NOTE — Interval H&P Note (Signed)
 History and Physical Interval Note:  07/05/2023 1:00 PM  [name]  has presented today for surgery, with the diagnosis of Left ureteral stone.  The various methods of treatment have been discussed with the patient and family. After consideration of risks, benefits and other options for treatment, the patient has consented to  Procedure(s): CYSTOSCOPY, WITH STENT INSERTION (Left) as a surgical intervention.  The patient's history has been reviewed, patient examined, no change in status, stable for surgery.  I have reviewed the patient's chart and labs.  Questions were answered to the patient's satisfaction.    The procedure was discussed in detail.  All questions were answered and she desires to proceed  [name]

## 2023-07-05 NOTE — Plan of Care (Signed)
  Problem: Pain Managment: Goal: General experience of comfort will improve and/or be controlled Outcome: Progressing   Problem: Safety: Goal: Ability to remain free from injury will improve Outcome: Progressing   Problem: Skin Integrity: Goal: Risk for impaired skin integrity will decrease Outcome: Progressing

## 2023-07-06 ENCOUNTER — Encounter: Payer: Self-pay | Admitting: Urology

## 2023-07-06 ENCOUNTER — Telehealth: Payer: Self-pay | Admitting: Urology

## 2023-07-06 DIAGNOSIS — N390 Urinary tract infection, site not specified: Secondary | ICD-10-CM | POA: Diagnosis not present

## 2023-07-06 DIAGNOSIS — N201 Calculus of ureter: Secondary | ICD-10-CM | POA: Diagnosis not present

## 2023-07-06 DIAGNOSIS — N200 Calculus of kidney: Secondary | ICD-10-CM | POA: Diagnosis not present

## 2023-07-06 LAB — MAGNESIUM: Magnesium: 2 mg/dL (ref 1.7–2.4)

## 2023-07-06 LAB — URINE CULTURE: Culture: NO GROWTH

## 2023-07-06 LAB — CBC
HCT: 32.6 % — ABNORMAL LOW (ref 36.0–46.0)
Hemoglobin: 10.3 g/dL — ABNORMAL LOW (ref 12.0–15.0)
MCH: 23.7 pg — ABNORMAL LOW (ref 26.0–34.0)
MCHC: 31.6 g/dL (ref 30.0–36.0)
MCV: 74.9 fL — ABNORMAL LOW (ref 80.0–100.0)
Platelets: 264 10*3/uL (ref 150–400)
RBC: 4.35 MIL/uL (ref 3.87–5.11)
RDW: 14.8 % (ref 11.5–15.5)
WBC: 8.1 10*3/uL (ref 4.0–10.5)
nRBC: 0 % (ref 0.0–0.2)

## 2023-07-06 LAB — BASIC METABOLIC PANEL WITH GFR
Anion gap: 7 (ref 5–15)
BUN: 13 mg/dL (ref 8–23)
CO2: 25 mmol/L (ref 22–32)
Calcium: 8.2 mg/dL — ABNORMAL LOW (ref 8.9–10.3)
Chloride: 108 mmol/L (ref 98–111)
Creatinine, Ser: 1.01 mg/dL — ABNORMAL HIGH (ref 0.44–1.00)
GFR, Estimated: 60 mL/min — ABNORMAL LOW (ref >60)
Glucose, Bld: 125 mg/dL — ABNORMAL HIGH (ref 70–99)
Potassium: 3.1 mmol/L — ABNORMAL LOW (ref 3.5–5.1)
Sodium: 140 mmol/L (ref 135–145)

## 2023-07-06 MED ORDER — SODIUM CHLORIDE 0.9 % IV SOLN: INTRAVENOUS | Status: DC

## 2023-07-06 MED ORDER — OXYBUTYNIN CHLORIDE 5 MG PO TABS
5.0000 mg | ORAL_TABLET | Freq: Three times a day (TID) | ORAL | Status: DC
  Administered 2023-07-06 – 2023-07-08 (×6): 5 mg via ORAL
  Filled 2023-07-06 (×7): qty 1

## 2023-07-06 MED ORDER — TAMSULOSIN HCL 0.4 MG PO CAPS
0.4000 mg | ORAL_CAPSULE | Freq: Every day | ORAL | Status: DC
  Administered 2023-07-06 – 2023-07-08 (×3): 0.4 mg via ORAL
  Filled 2023-07-06 (×3): qty 1

## 2023-07-06 MED ORDER — POTASSIUM CHLORIDE CRYS ER 20 MEQ PO TBCR
40.0000 meq | EXTENDED_RELEASE_TABLET | Freq: Once | ORAL | Status: AC
  Administered 2023-07-06: 40 meq via ORAL
  Filled 2023-07-06: qty 2

## 2023-07-06 NOTE — Progress Notes (Signed)
  PROGRESS NOTE    [name]  ZOX:[id number] DOB: 05/05/1950 DOA: 07/04/2023 PCP: [facility]  134A/134A-AA  LOS: 1 day   Brief hospital course:   Assessment & Plan: [name] is a 73 y.o. female with medical history significant of chronic A-flutter status post ablation recently, on Eliquis , PAD status post stenting, HTN, CAD, cigarette smoking, presented with sudden onset of left flank pain.    Acute left-sided obstructive ureteral stone with acute left-sided obstructive uropathy and hydronephrosis S/p stent placement on 07/05/23 --cont MIVF --start oxybutynin  and Flomax  for continued left flank pain --plan for outpatient urology f/u for possible ESWL  Probably complicated UTI Possible left-sided pyelonephritis - Secondary to left-sided obstructive ureteral stone --urine cx obtained on presentation with multiple species.  Urine cx collected again during cystoscopy  --cont ceftriaxone    PVD with recent stenting - No acute concern --cont ASA and statin   HTN, uncontrolled --cont amlodipine  and losartan    Hypothyroidism - Continue Synthroid    PAF status post ablation - In sinus rhythm --hold Eliquis    DVT prophylaxis: Lovenox  SQ Code Status: Full code  Family Communication:  Level of care: Med-Surg Dispo:   The patient is from: home Anticipated d/c is to: home Anticipated d/c date is: 1-2 days   Subjective and Interval History:  Pt continued to have left flank pain that hit her suddenly.  Also reported nausea.   Objective: Vitals:   07/05/23 1958 07/06/23 0623 07/06/23 0748 07/06/23 1510  BP: (!) 138/56 (!) 142/66 (!) 160/85 138/63  Pulse: 64 (!) 58 (!) 57 61  Resp: 17 18 16 16   Temp: 98.5 F (36.9 C) 97.8 F (36.6 C) 97.8 F (36.6 C) 98.1 F (36.7 C)  TempSrc: Oral Oral    SpO2: 95% 98% 100% 97%  Weight:      Height:        Intake/Output Summary (Last 24 hours) at 07/06/2023 1748 Last data filed at 07/06/2023 1746 Gross  per 24 hour  Intake 1023.34 ml  Output --  Net 1023.34 ml   Filed Weights   07/04/23 0348 07/05/23 1147  Weight: 78.1 kg 79.8 kg    Examination:   Constitutional: NAD, AAOx3 HEENT: conjunctivae and lids normal, EOMI CV: No cyanosis.   RESP: normal respiratory effort, on RA Neuro: II - XII grossly intact.   Psych: Normal mood and affect.  Appropriate judgement and reason   Data Reviewed: I have personally reviewed labs and imaging studies  Time spent: 35 minutes  Garrison Kanner, MD [facility] If 7PM-7AM, please contact night-coverage 07/06/2023, 5:48 PM

## 2023-07-06 NOTE — Anesthesia Postprocedure Evaluation (Signed)
 Anesthesia Post Note  Patient: [name]  Procedure(s) Performed: CYSTOSCOPY, WITH STENT INSERTION (Left: Ureter)  Patient location during evaluation: PACU Anesthesia Type: General Level of consciousness: awake and alert Pain management: pain level controlled Vital Signs Assessment: post-procedure vital signs reviewed and stable Respiratory status: spontaneous breathing, nonlabored ventilation, respiratory function stable and patient connected to nasal cannula oxygen Cardiovascular status: blood pressure returned to baseline and stable Postop Assessment: no apparent nausea or vomiting Anesthetic complications: no  There were no known notable events for this encounter.   Last Vitals:  Vitals:   07/06/23 0748 07/06/23 1510  BP: (!) 160/85 138/63  Pulse: (!) 57 61  Resp: 16 16  Temp: 36.6 C 36.7 C  SpO2: 100% 97%    Last Pain:  Vitals:   07/06/23 0838  TempSrc:   PainSc: 2                  Enrique Harvest

## 2023-07-06 NOTE — Op Note (Signed)
    Preoperative diagnosis:  Left proximal ureteral calculus with obstruction Possible urinary tract infection  Postoperative diagnosis:  Same  Procedure:  Cystoscopy Left ureteral stent placement (3F/24 cm) Left retrograde pyelography with interpretation  Surgeon: Geralyn Knee C. Angle Karel, M.D.  Anesthesia: MAC  Complications: None  Intraoperative findings:  Cystoscopy: Bladder mucosa normal in appearance without erythema, solid or papillary lesions Left retrograde pyelogram: Moderate hydronephrosis to a 9 mm left proximal ureteral calculus  EBL: Minimal  Specimens: Urine left renal pelvis for C&S  Indication: [name] is a 73 y.o. female with a 9 mm obstructing left proximal ureteral stone with possible infection who presents for ureteral stent placement.  Refer to the urology consult note 07/04/23.  After reviewing the management options for treatment, she elected to proceed with the above surgical procedure(s). We have discussed the potential benefits and risks of the procedure, side effects of the proposed treatment, the likelihood of the patient achieving the goals of the procedure, and any potential problems that might occur during the procedure or recuperation. Informed consent has been obtained.  Description of procedure:  The patient was taken to the operating room and general anesthesia was induced.  The patient was placed in the dorsal lithotomy position, prepped and draped in the usual sterile fashion, and preoperative antibiotics were administered. A preoperative time-out was performed.   A 21 French cystoscope sheath with obturator was lubricated and passed per urethra.  A 30 degree lens was then placed and panendoscopy was performed with findings as described above.  Attention was directed to the left ureteral orifice and a 0.038 Sensor guidewire was then advanced up the ureter into the renal pelvis under fluoroscopic guidance.  The calculus was visualized on  fluoroscopy.  A 3F open-ended ureteral catheter was then advanced over the guidewire however could not be advanced proximal to the stone.  The 3F catheter was exchanged for a 45F catheter and this was able to be advanced proximal to the calculus into the UPJ.  10 cc of amber urine was aspirated and sent for culture.  Retrograde pyelogram was then performed through the ureteral catheter with findings as described above.  The guidewire was replaced and the ureteral catheter was removed.    A 3F/24 cm Contour ureteral stent was advanced over the guidewire.  The stent was positioned appropriately under fluoroscopic and cystoscopic guidance.  The wire was then removed with an adequate stent curl noted in the renal pelvis as well as in the bladder.  The bladder was then emptied and the procedure ended.  The patient appeared to tolerate the procedure well and without complications.  She was transported to the PACU in stable condition.  Plan: Definitive stone treatment with either SWL or ureteroscopy and ~ 2 weeks   Pamela Elam, MD

## 2023-07-06 NOTE — Progress Notes (Cosign Needed)
 Urology Consult Follow Up  Subjective: Patient underwent placement of a left ureteral stent yesterday with Dr. Cherylene Corrente for a 9 mm left UPJ stone with possible UTI versus pyelonephritis.  VSS afebrile  CBC does not note any leukocytosis.  Serum creatinine up to 1.01 from 0.97 yesterday likely due to manipulation of the stone with the stent and possible infection.  Admission urine culture grew out multiple species, so a culture was sent from the aspirated urine from the stent placement yesterday and these results are still pending.  She is having some left-sided flank discomfort this morning.  She states she is passing dark-colored urine.    Anti-infectives: Anti-infectives (From admission, onward)    Start     Dose/Rate Route Frequency Ordered Stop   07/04/23 0800  cefTRIAXone  (ROCEPHIN ) 2 g in sodium chloride  0.9 % 100 mL IVPB        2 g 200 mL/hr over 30 Minutes Intravenous Every 24 hours 07/04/23 0729         Current Facility-Administered Medications  Medication Dose Route Frequency Provider Last Rate Last Admin   0.9 %  sodium chloride  infusion   Intravenous Continuous Garrison Kanner, MD 100 mL/hr at 07/05/23 2238 Infusion Verify at 07/05/23 2238   acetaminophen  (TYLENOL ) tablet 650 mg  650 mg Oral Q6H PRN Ree Candy, MD       Or   acetaminophen  (TYLENOL ) suppository 650 mg  650 mg Rectal Q6H PRN Ree Candy, MD       albuterol  (PROVENTIL ) (2.5 MG/3ML) 0.083% nebulizer solution 2.5 mg  2.5 mg Nebulization Q2H PRN Ree Candy, MD       amLODipine  (NORVASC ) tablet 10 mg  10 mg Oral Daily Antoniette Batty T, MD   10 mg at 07/06/23 5284   aspirin  EC tablet 81 mg  81 mg Oral Daily Antoniette Batty T, MD   81 mg at 07/06/23 1324   atorvastatin  (LIPITOR) tablet 10 mg  10 mg Oral Daily Antoniette Batty T, MD   10 mg at 07/06/23 4010   bisacodyl  (DULCOLAX) EC tablet 5 mg  5 mg Oral Daily PRN Ree Candy, MD       cefTRIAXone  (ROCEPHIN ) 2 g in sodium chloride  0.9 % 100 mL IVPB   2 g Intravenous Q24H Antoniette Batty T, MD 200 mL/hr at 07/06/23 0757 2 g at 07/06/23 0757   enoxaparin  (LOVENOX ) injection 40 mg  40 mg Subcutaneous Q24H Antoniette Batty T, MD   40 mg at 07/05/23 2010   fluticasone  furoate-vilanterol (BREO ELLIPTA ) 100-25 MCG/ACT 1 puff  1 puff Inhalation Daily Antoniette Batty T, MD   1 puff at 07/06/23 0755   levothyroxine  (SYNTHROID ) tablet 100 mcg  100 mcg Oral Q0600 Antoniette Batty T, MD   100 mcg at 07/06/23 2725   loratadine  (CLARITIN ) tablet 10 mg  10 mg Oral QPM Antoniette Batty T, MD   10 mg at 07/05/23 1709   losartan  (COZAAR ) tablet 100 mg  100 mg Oral Daily Antoniette Batty T, MD   100 mg at 07/06/23 3664   morphine  (PF) 2 MG/ML injection 2 mg  2 mg Intravenous Q2H PRN Anderson, Chelsey L, MD   2 mg at 07/05/23 0859   ondansetron  (ZOFRAN ) tablet 4 mg  4 mg Oral Q6H PRN Ree Candy, MD       Or   ondansetron  (ZOFRAN ) injection 4 mg  4 mg Intravenous Q6H PRN Anderson, Chelsey L, MD   4 mg at 07/05/23 0900  oxyCODONE  (Oxy IR/ROXICODONE ) immediate release tablet 5-10 mg  5-10 mg Oral Q4H PRN Garrison Kanner, MD   5 mg at 07/06/23 0753   pantoprazole  (PROTONIX ) EC tablet 40 mg  40 mg Oral Daily Antoniette Batty T, MD   40 mg at 07/06/23 4098   polyethylene glycol (MIRALAX  / GLYCOLAX ) packet 17 g  17 g Oral Daily Ree Candy, MD   17 g at 07/04/23 1039   prochlorperazine  (COMPAZINE ) injection 5 mg  5 mg Intravenous Q6H PRN Garrison Kanner, MD         Objective: Vital signs in last 24 hours: Temp:  [97.3 F (36.3 C)-98.5 F (36.9 C)] 97.8 F (36.6 C) (04/23 0748) Pulse Rate:  [57-75] 57 (04/23 0748) Resp:  [16-20] 16 (04/23 0748) BP: (128-166)/(56-97) 160/85 (04/23 0748) SpO2:  [92 %-100 %] 100 % (04/23 0748) Weight:  [79.8 kg] 79.8 kg (04/22 1147)  Intake/Output from previous day: 04/22 0701 - 04/23 0700 In: 1023.3 [P.O.:240; I.V.:683.3; IV Piggyback:100] Out: -  Intake/Output this shift: No intake/output data recorded.   Physical Exam Vitals and nursing note  reviewed.  Constitutional:      General: She is not in acute distress.    Appearance: Normal appearance. She is obese. She is not ill-appearing, toxic-appearing or diaphoretic.  HENT:     Head: Normocephalic and atraumatic.     Nose: Nose normal.     Mouth/Throat:     Mouth: Mucous membranes are moist.  Eyes:     Extraocular Movements: Extraocular movements intact.     Conjunctiva/sclera: Conjunctivae normal.     Pupils: Pupils are equal, round, and reactive to light.  Pulmonary:     Effort: Pulmonary effort is normal.  Abdominal:     Palpations: Abdomen is soft.     Tenderness: There is left CVA tenderness.  Musculoskeletal:        General: Normal range of motion.     Cervical back: Normal range of motion.  Skin:    General: Skin is warm and dry.  Neurological:     General: No focal deficit present.     Mental Status: She is alert and oriented to person, place, and time.  Psychiatric:        Mood and Affect: Mood normal.        Behavior: Behavior normal.        Thought Content: Thought content normal.        Judgment: Judgment normal.     Lab Results:  Recent Labs    07/04/23 0351 07/06/23 0326  WBC 9.1 8.1  HGB 10.3* 10.3*  HCT 33.7* 32.6*  PLT 295 264   BMET Recent Labs    07/04/23 0351 07/06/23 0326  NA 141 140  K 3.7 3.1*  CL 108 108  CO2 26 25  GLUCOSE 141* 125*  BUN 15 13  CREATININE 0.97 1.01*  CALCIUM  8.8* 8.2*   PT/INR No results for input(s): "LABPROT", "INR" in the last 72 hours. ABG No results for input(s): "PHART", "HCO3" in the last 72 hours.  Invalid input(s): "PCO2", "PO2"  Studies/Results: DG OR UROLOGY CYSTO IMAGE ([facility] ONLY) Result Date: 07/05/2023 There is no interpretation for this exam.  This order is for images obtained during a surgical procedure.  Please See "Surgeries" Tab for more information regarding the procedure.     Assessment: 73 year old female who had a recent ablation for her chronic a flutter and is  currently on Eliquis , PAD status post RLE angioplasty/thrombectomy/stent 2 weeks  ago and CAD who was admitted with an obstructing 9 mm left UPJ stone with possible pyelonephritis who underwent left ureteral stent placement for urinary decompression in the setting of a possibly infected stone.  Urine culture from stent placement results still pending  No leukocytosis on recent CBC, but an increase in serum creatinine is noted likely due to stent manipulation of stone  Plan: - Continue broad-spectrum antibiotic until urine culture is available and then narrow, will need at least 10 days of culture appropriate antibiotics - Discussed with patient what to expect with a stent in place, urgency, dysuria, hematuria and flank pain - Recommend oxybutynin  IR 5 mg 3 times daily as needed for stent discomfort - Recommend tamsulosin  0.4 mg daily for stent discomfort - Discussed with patient definitive stone management with the options of ureteroscopy versus ESWL - She is hesitant to undergo general anesthesia again so she would like to see if she would be a candidate for ESWL in the future - Explained to the patient that with ESWL, shock waves are focused on the stone using X-rays to pinpoint the stone.  The shock waves are fired repeatedly which usually causes the stone to break into small pieces which pass out in the urine over the next few weeks.  They will go home that day and may be able to resume normal activities in three days.  They should be given a strainer and they need to collect any fragments/sediments that they pass for analysis.   Risks involved with the procedure consist of bruising of the skin and kidney, possible long-term kidney damage, development of HTN, damage to the bowel, spleen, liver, pancreas, female organs or lungs, hematuria, hematuria serious enough to require transfusion or surgical repair, formation of a hematoma, infection or sepsis.  If the stone is too large or dense, it may not  break apart or break apart in large pieces and cause "Steinstrasse."  If this happens, it would result in the need for another procedure (likely URS/LL/stent placement).   - She is in agreement and wishes to proceed with ESWL in the future - We will arrange for a follow-up appointment with us  as outpatient with KUB prior    LOS: 1 day    [facility] 07/06/2023

## 2023-07-06 NOTE — Telephone Encounter (Signed)
 She will need an outpatient follow-up appointment with a KUB prior when she is discharged to see if she is a candidate for ESWL.

## 2023-07-07 DIAGNOSIS — N390 Urinary tract infection, site not specified: Secondary | ICD-10-CM | POA: Diagnosis not present

## 2023-07-07 DIAGNOSIS — N200 Calculus of kidney: Secondary | ICD-10-CM | POA: Diagnosis not present

## 2023-07-07 DIAGNOSIS — N12 Tubulo-interstitial nephritis, not specified as acute or chronic: Secondary | ICD-10-CM | POA: Diagnosis not present

## 2023-07-07 DIAGNOSIS — N201 Calculus of ureter: Secondary | ICD-10-CM | POA: Diagnosis not present

## 2023-07-07 LAB — BASIC METABOLIC PANEL WITH GFR
Anion gap: 11 (ref 5–15)
BUN: 13 mg/dL (ref 8–23)
CO2: 25 mmol/L (ref 22–32)
Calcium: 8.1 mg/dL — ABNORMAL LOW (ref 8.9–10.3)
Chloride: 110 mmol/L (ref 98–111)
Creatinine, Ser: 0.89 mg/dL (ref 0.44–1.00)
GFR, Estimated: >60 mL/min (ref >60)
Glucose, Bld: 103 mg/dL — ABNORMAL HIGH (ref 70–99)
Potassium: 3 mmol/L — ABNORMAL LOW (ref 3.5–5.1)
Sodium: 146 mmol/L — ABNORMAL HIGH (ref 135–145)

## 2023-07-07 LAB — CBC
HCT: 31.5 % — ABNORMAL LOW (ref 36.0–46.0)
Hemoglobin: 9.6 g/dL — ABNORMAL LOW (ref 12.0–15.0)
MCH: 23.7 pg — ABNORMAL LOW (ref 26.0–34.0)
MCHC: 30.5 g/dL (ref 30.0–36.0)
MCV: 77.8 fL — ABNORMAL LOW (ref 80.0–100.0)
Platelets: 250 10*3/uL (ref 150–400)
RBC: 4.05 MIL/uL (ref 3.87–5.11)
RDW: 14.8 % (ref 11.5–15.5)
WBC: 8.2 10*3/uL (ref 4.0–10.5)
nRBC: 0 % (ref 0.0–0.2)

## 2023-07-07 MED ORDER — POTASSIUM CHLORIDE CRYS ER 20 MEQ PO TBCR
40.0000 meq | EXTENDED_RELEASE_TABLET | ORAL | Status: AC
  Administered 2023-07-07 (×2): 40 meq via ORAL
  Filled 2023-07-07 (×2): qty 2

## 2023-07-07 MED ORDER — SODIUM CHLORIDE 0.9 % IV SOLN: INTRAVENOUS | Status: DC

## 2023-07-07 NOTE — Progress Notes (Signed)
  PROGRESS NOTE    [name]  QIO:[id number] DOB: 1950/12/05 DOA: 07/04/2023 PCP: [facility]  134A/134A-AA  LOS: 2 days   Brief hospital course:   Assessment & Plan: [name] is a 73 y.o. female with medical history significant of chronic A-flutter status post ablation recently, on Eliquis , PAD status post stenting, HTN, CAD, cigarette smoking, presented with sudden onset of left flank pain.    Acute left-sided obstructive ureteral stone with acute left-sided obstructive uropathy and hydronephrosis S/p stent placement on 07/05/23 --cont MIVF --cont oxybutynin  and Flomax  for continued left flank pain (new) --plan for outpatient urology f/u for possible ESWL  Hematuria --mild, 2/2 to above --hold Eliquis  for now  Probably complicated UTI Possible left-sided pyelonephritis - Secondary to left-sided obstructive ureteral stone --urine cx obtained on presentation with multiple species.  Urine cx collected again during cystoscopy neg growth --cont ceftriaxone  --discharge on cefdinir for a total of 10-day course --can order pyridium if pt has dysuria   PVD with recent stenting - No acute concern --cont ASA and statin   HTN, uncontrolled --cont amlodipine  and losartan    Hypothyroidism - Continue Synthroid    PAF status post ablation - In sinus rhythm --hold Eliquis  2/2 hematuria   DVT prophylaxis: Lovenox  SQ Code Status: Full code  Family Communication:  Level of care: Med-Surg Dispo:   The patient is from: home Anticipated d/c is to: home Anticipated d/c date is: 1-2 days   Subjective and Interval History:  Pt reported she continued to have sudden onset severe pain that woke her up from sleep, associated with N/V.   Objective: Vitals:   07/06/23 2045 07/07/23 0419 07/07/23 0753 07/07/23 1514  BP: 138/65 (!) 138/59 (!) 141/59 (!) 119/51  Pulse: (!) 56 (!) 50 (!) 59 68  Resp: 17 18 16 16   Temp: 97.6 F (36.4 C) 97.6 F (36.4 C)  (!) 97.5 F (36.4 C) 97.8 F (36.6 C)  TempSrc:    Oral  SpO2: 99% 94% 100% 96%  Weight:      Height:        Intake/Output Summary (Last 24 hours) at 07/07/2023 1836 Last data filed at 07/07/2023 1642 Gross per 24 hour  Intake 2407.73 ml  Output --  Net 2407.73 ml   Filed Weights   07/04/23 0348 07/05/23 1147  Weight: 78.1 kg 79.8 kg    Examination:   Constitutional: NAD, AAOx3 HEENT: conjunctivae and lids normal, EOMI CV: No cyanosis.   RESP: normal respiratory effort, on RA Neuro: II - XII grossly intact.   Psych: Normal mood and affect.  Appropriate judgement and reason   Data Reviewed: I have personally reviewed labs and imaging studies  Time spent: 50 minutes  Garrison Kanner, MD [facility] If 7PM-7AM, please contact night-coverage 07/07/2023, 6:36 PM

## 2023-07-07 NOTE — Progress Notes (Signed)
 Urology Inpatient Progress Note  Subjective: No acute events overnight.  She has been afebrile, VSS. Creatinine down, 0.89.  WBC count stable, 8.2.  Admission urine culture finalized with multiple species, repeat urine culture with no growth.  On antibiotics as below. She had an episode of sharp pain this morning that caused emesis.  As of this afternoon, she states that her pain has improved significantly and she denies additional nausea or vomiting.  She is having some dysuria and frequency.  She remains on oxybutynin  5 mg 3 times daily and Flomax  daily.  Anti-infectives: Anti-infectives (From admission, onward)    Start     Dose/Rate Route Frequency Ordered Stop   07/04/23 0800  cefTRIAXone  (ROCEPHIN ) 2 g in sodium chloride  0.9 % 100 mL IVPB        2 g 200 mL/hr over 30 Minutes Intravenous Every 24 hours 07/04/23 0729         Current Facility-Administered Medications  Medication Dose Route Frequency Provider Last Rate Last Admin   0.9 %  sodium chloride  infusion   Intravenous Continuous Garrison Kanner, MD 75 mL/hr at 07/07/23 0841 New Bag at 07/07/23 0841   acetaminophen  (TYLENOL ) tablet 650 mg  650 mg Oral Q6H PRN Ree Candy, MD       Or   acetaminophen  (TYLENOL ) suppository 650 mg  650 mg Rectal Q6H PRN Ree Candy, MD       albuterol  (PROVENTIL ) (2.5 MG/3ML) 0.083% nebulizer solution 2.5 mg  2.5 mg Nebulization Q2H PRN Ree Candy, MD       amLODipine  (NORVASC ) tablet 10 mg  10 mg Oral Daily Antoniette Batty T, MD   10 mg at 07/07/23 0845   aspirin  EC tablet 81 mg  81 mg Oral Daily Antoniette Batty T, MD   81 mg at 07/07/23 0845   atorvastatin  (LIPITOR) tablet 10 mg  10 mg Oral Daily Antoniette Batty T, MD   10 mg at 07/07/23 0845   bisacodyl  (DULCOLAX) EC tablet 5 mg  5 mg Oral Daily PRN Ree Candy, MD       cefTRIAXone  (ROCEPHIN ) 2 g in sodium chloride  0.9 % 100 mL IVPB  2 g Intravenous Q24H Antoniette Batty T, MD 200 mL/hr at 07/07/23 0842 2 g at 07/07/23 0842    enoxaparin  (LOVENOX ) injection 40 mg  40 mg Subcutaneous Q24H Antoniette Batty T, MD   40 mg at 07/06/23 2113   fluticasone  furoate-vilanterol (BREO ELLIPTA ) 100-25 MCG/ACT 1 puff  1 puff Inhalation Daily Antoniette Batty T, MD   1 puff at 07/07/23 0844   levothyroxine  (SYNTHROID ) tablet 100 mcg  100 mcg Oral Q0600 Antoniette Batty T, MD   100 mcg at 07/07/23 0513   loratadine  (CLARITIN ) tablet 10 mg  10 mg Oral QPM Antoniette Batty T, MD   10 mg at 07/06/23 1739   losartan  (COZAAR ) tablet 100 mg  100 mg Oral Daily Antoniette Batty T, MD   100 mg at 07/07/23 0845   morphine  (PF) 2 MG/ML injection 2 mg  2 mg Intravenous Q2H PRN Anderson, Chelsey L, MD   2 mg at 07/07/23 1610   ondansetron  (ZOFRAN ) tablet 4 mg  4 mg Oral Q6H PRN Ree Candy, MD       Or   ondansetron  (ZOFRAN ) injection 4 mg  4 mg Intravenous Q6H PRN Anderson, Chelsey L, MD   4 mg at 07/07/23 9604   oxybutynin  (DITROPAN ) tablet 5 mg  5 mg Oral TID Garrison Kanner, MD  5 mg at 07/07/23 0845   oxyCODONE  (Oxy IR/ROXICODONE ) immediate release tablet 5-10 mg  5-10 mg Oral Q4H PRN Garrison Kanner, MD   10 mg at 07/07/23 0050   pantoprazole  (PROTONIX ) EC tablet 40 mg  40 mg Oral Daily Zhang, Ping T, MD   40 mg at 07/07/23 0845   polyethylene glycol (MIRALAX  / GLYCOLAX ) packet 17 g  17 g Oral Daily Ree Candy, MD   17 g at 07/07/23 0845   potassium chloride  SA (KLOR-CON  M) CR tablet 40 mEq  40 mEq Oral Q4H Garrison Kanner, MD   40 mEq at 07/07/23 0846   prochlorperazine  (COMPAZINE ) injection 5 mg  5 mg Intravenous Q6H PRN Garrison Kanner, MD   5 mg at 07/07/23 0846   tamsulosin  (FLOMAX ) capsule 0.4 mg  0.4 mg Oral Daily Garrison Kanner, MD   0.4 mg at 07/07/23 0846     Objective: Vital signs in last 24 hours: Temp:  [97.5 F (36.4 C)-98.1 F (36.7 C)] 97.5 F (36.4 C) (04/24 0753) Pulse Rate:  [50-61] 59 (04/24 0753) Resp:  [16-18] 16 (04/24 0753) BP: (138-141)/(59-65) 141/59 (04/24 0753) SpO2:  [94 %-100 %] 100 % (04/24 0753)  Intake/Output from previous day: 04/23  0701 - 04/24 0700 In: 1606.5 [I.V.:1506.5; IV Piggyback:100] Out: -  Intake/Output this shift: No intake/output data recorded.  Physical Exam Vitals and nursing note reviewed.  Constitutional:      General: She is not in acute distress.    Appearance: She is not ill-appearing, toxic-appearing or diaphoretic.  HENT:     Head: Normocephalic and atraumatic.  Pulmonary:     Effort: Pulmonary effort is normal. No respiratory distress.  Skin:    General: Skin is warm and dry.  Neurological:     Mental Status: She is alert and oriented to person, place, and time.  Psychiatric:        Mood and Affect: Mood normal.        Behavior: Behavior normal.     Lab Results:  Recent Labs    07/06/23 0326 07/07/23 0243  WBC 8.1 8.2  HGB 10.3* 9.6*  HCT 32.6* 31.5*  PLT 264 250   BMET Recent Labs    07/06/23 0326 07/07/23 0243  NA 140 146*  K 3.1* 3.0*  CL 108 110  CO2 25 25  GLUCOSE 125* 103*  BUN 13 13  CREATININE 1.01* 0.89  CALCIUM  8.2* 8.1*   Assessment & Plan: 73 year old female with PMH chronic A-flutter s/p recent ablation with Eliquis  being held, PAD s/p RLE angioplasty/thrombectomy/stent 2 weeks ago, and CAD now s/p left ureteral stent placement with Dr. Cherylene Corrente for management of an obstructing 9 mm left UPJ stone with possible UTI versus pyelonephritis.  She had some rather severe stent discomfort this morning associated with vomiting, however her symptoms have significantly improved as of our arrival this afternoon.  Recommendations:  - Continue antibiotics for total of 10 days, recommend cefdinir on discharge - Continue oxybutynin  IR 5 mg every 8 hours and Flomax  0.4 mg daily - Consider Pyridium if she continues to have marked dysuria - Outpatient follow-up with urology to discuss definitive stone management (scheduled)  Kathreen Pare, PA-C 07/07/2023

## 2023-07-07 NOTE — H&P (View-Only) (Signed)
 Urology Inpatient Progress Note  Subjective: No acute events overnight.  She has been afebrile, VSS. Creatinine down, 0.89.  WBC count stable, 8.2.  Admission urine culture finalized with multiple species, repeat urine culture with no growth.  On antibiotics as below. She had an episode of sharp pain this morning that caused emesis.  As of this afternoon, she states that her pain has improved significantly and she denies additional nausea or vomiting.  She is having some dysuria and frequency.  She remains on oxybutynin  5 mg 3 times daily and Flomax  daily.  Anti-infectives: Anti-infectives (From admission, onward)    Start     Dose/Rate Route Frequency Ordered Stop   07/04/23 0800  cefTRIAXone  (ROCEPHIN ) 2 g in sodium chloride  0.9 % 100 mL IVPB        2 g 200 mL/hr over 30 Minutes Intravenous Every 24 hours 07/04/23 0729         Current Facility-Administered Medications  Medication Dose Route Frequency Provider Last Rate Last Admin   0.9 %  sodium chloride  infusion   Intravenous Continuous Garrison Kanner, MD 75 mL/hr at 07/07/23 0841 New Bag at 07/07/23 0841   acetaminophen  (TYLENOL ) tablet 650 mg  650 mg Oral Q6H PRN Ree Candy, MD       Or   acetaminophen  (TYLENOL ) suppository 650 mg  650 mg Rectal Q6H PRN Ree Candy, MD       albuterol  (PROVENTIL ) (2.5 MG/3ML) 0.083% nebulizer solution 2.5 mg  2.5 mg Nebulization Q2H PRN Ree Candy, MD       amLODipine  (NORVASC ) tablet 10 mg  10 mg Oral Daily Antoniette Batty T, MD   10 mg at 07/07/23 0845   aspirin  EC tablet 81 mg  81 mg Oral Daily Antoniette Batty T, MD   81 mg at 07/07/23 0845   atorvastatin  (LIPITOR) tablet 10 mg  10 mg Oral Daily Antoniette Batty T, MD   10 mg at 07/07/23 0845   bisacodyl  (DULCOLAX) EC tablet 5 mg  5 mg Oral Daily PRN Ree Candy, MD       cefTRIAXone  (ROCEPHIN ) 2 g in sodium chloride  0.9 % 100 mL IVPB  2 g Intravenous Q24H Antoniette Batty T, MD 200 mL/hr at 07/07/23 0842 2 g at 07/07/23 0842    enoxaparin  (LOVENOX ) injection 40 mg  40 mg Subcutaneous Q24H Antoniette Batty T, MD   40 mg at 07/06/23 2113   fluticasone  furoate-vilanterol (BREO ELLIPTA ) 100-25 MCG/ACT 1 puff  1 puff Inhalation Daily Antoniette Batty T, MD   1 puff at 07/07/23 0844   levothyroxine  (SYNTHROID ) tablet 100 mcg  100 mcg Oral Q0600 Antoniette Batty T, MD   100 mcg at 07/07/23 0513   loratadine  (CLARITIN ) tablet 10 mg  10 mg Oral QPM Antoniette Batty T, MD   10 mg at 07/06/23 1739   losartan  (COZAAR ) tablet 100 mg  100 mg Oral Daily Antoniette Batty T, MD   100 mg at 07/07/23 0845   morphine  (PF) 2 MG/ML injection 2 mg  2 mg Intravenous Q2H PRN Anderson, Chelsey L, MD   2 mg at 07/07/23 1610   ondansetron  (ZOFRAN ) tablet 4 mg  4 mg Oral Q6H PRN Ree Candy, MD       Or   ondansetron  (ZOFRAN ) injection 4 mg  4 mg Intravenous Q6H PRN Anderson, Chelsey L, MD   4 mg at 07/07/23 9604   oxybutynin  (DITROPAN ) tablet 5 mg  5 mg Oral TID Garrison Kanner, MD  5 mg at 07/07/23 0845   oxyCODONE  (Oxy IR/ROXICODONE ) immediate release tablet 5-10 mg  5-10 mg Oral Q4H PRN Garrison Kanner, MD   10 mg at 07/07/23 0050   pantoprazole  (PROTONIX ) EC tablet 40 mg  40 mg Oral Daily Zhang, Ping T, MD   40 mg at 07/07/23 0845   polyethylene glycol (MIRALAX  / GLYCOLAX ) packet 17 g  17 g Oral Daily Ree Candy, MD   17 g at 07/07/23 0845   potassium chloride  SA (KLOR-CON  M) CR tablet 40 mEq  40 mEq Oral Q4H Garrison Kanner, MD   40 mEq at 07/07/23 0846   prochlorperazine  (COMPAZINE ) injection 5 mg  5 mg Intravenous Q6H PRN Garrison Kanner, MD   5 mg at 07/07/23 0846   tamsulosin  (FLOMAX ) capsule 0.4 mg  0.4 mg Oral Daily Garrison Kanner, MD   0.4 mg at 07/07/23 0846     Objective: Vital signs in last 24 hours: Temp:  [97.5 F (36.4 C)-98.1 F (36.7 C)] 97.5 F (36.4 C) (04/24 0753) Pulse Rate:  [50-61] 59 (04/24 0753) Resp:  [16-18] 16 (04/24 0753) BP: (138-141)/(59-65) 141/59 (04/24 0753) SpO2:  [94 %-100 %] 100 % (04/24 0753)  Intake/Output from previous day: 04/23  0701 - 04/24 0700 In: 1606.5 [I.V.:1506.5; IV Piggyback:100] Out: -  Intake/Output this shift: No intake/output data recorded.  Physical Exam Vitals and nursing note reviewed.  Constitutional:      General: She is not in acute distress.    Appearance: She is not ill-appearing, toxic-appearing or diaphoretic.  HENT:     Head: Normocephalic and atraumatic.  Pulmonary:     Effort: Pulmonary effort is normal. No respiratory distress.  Skin:    General: Skin is warm and dry.  Neurological:     Mental Status: She is alert and oriented to person, place, and time.  Psychiatric:        Mood and Affect: Mood normal.        Behavior: Behavior normal.     Lab Results:  Recent Labs    07/06/23 0326 07/07/23 0243  WBC 8.1 8.2  HGB 10.3* 9.6*  HCT 32.6* 31.5*  PLT 264 250   BMET Recent Labs    07/06/23 0326 07/07/23 0243  NA 140 146*  K 3.1* 3.0*  CL 108 110  CO2 25 25  GLUCOSE 125* 103*  BUN 13 13  CREATININE 1.01* 0.89  CALCIUM  8.2* 8.1*   Assessment & Plan: 73 year old female with PMH chronic A-flutter s/p recent ablation with Eliquis  being held, PAD s/p RLE angioplasty/thrombectomy/stent 2 weeks ago, and CAD now s/p left ureteral stent placement with Dr. Cherylene Corrente for management of an obstructing 9 mm left UPJ stone with possible UTI versus pyelonephritis.  She had some rather severe stent discomfort this morning associated with vomiting, however her symptoms have significantly improved as of our arrival this afternoon.  Recommendations:  - Continue antibiotics for total of 10 days, recommend cefdinir on discharge - Continue oxybutynin  IR 5 mg every 8 hours and Flomax  0.4 mg daily - Consider Pyridium if she continues to have marked dysuria - Outpatient follow-up with urology to discuss definitive stone management (scheduled)  Kathreen Pare, PA-C 07/07/2023

## 2023-07-07 NOTE — Plan of Care (Signed)

## 2023-07-07 NOTE — Plan of Care (Signed)

## 2023-07-07 NOTE — Plan of Care (Signed)
  Problem: Activity: Goal: Risk for activity intolerance will decrease Outcome: Progressing   Problem: Nutrition: Goal: Adequate nutrition will be maintained Outcome: Progressing   Problem: Coping: Goal: Level of anxiety will decrease Outcome: Progressing   Problem: Pain Managment: Goal: General experience of comfort will improve and/or be controlled Outcome: Progressing   Problem: Safety: Goal: Ability to remain free from injury will improve Outcome: Progressing

## 2023-07-08 ENCOUNTER — Other Ambulatory Visit: Payer: Self-pay | Admitting: Hospitalist

## 2023-07-08 ENCOUNTER — Other Ambulatory Visit: Payer: Self-pay

## 2023-07-08 DIAGNOSIS — N200 Calculus of kidney: Secondary | ICD-10-CM | POA: Diagnosis not present

## 2023-07-08 LAB — CBC
HCT: 33.9 % — ABNORMAL LOW (ref 36.0–46.0)
Hemoglobin: 10.4 g/dL — ABNORMAL LOW (ref 12.0–15.0)
MCH: 23.3 pg — ABNORMAL LOW (ref 26.0–34.0)
MCHC: 30.7 g/dL (ref 30.0–36.0)
MCV: 76 fL — ABNORMAL LOW (ref 80.0–100.0)
Platelets: 278 10*3/uL (ref 150–400)
RBC: 4.46 MIL/uL (ref 3.87–5.11)
RDW: 15.1 % (ref 11.5–15.5)
WBC: 7.3 10*3/uL (ref 4.0–10.5)
nRBC: 0 % (ref 0.0–0.2)

## 2023-07-08 LAB — BASIC METABOLIC PANEL WITH GFR
Anion gap: 7 (ref 5–15)
BUN: 7 mg/dL — ABNORMAL LOW (ref 8–23)
CO2: 25 mmol/L (ref 22–32)
Calcium: 8.3 mg/dL — ABNORMAL LOW (ref 8.9–10.3)
Chloride: 109 mmol/L (ref 98–111)
Creatinine, Ser: 0.64 mg/dL (ref 0.44–1.00)
GFR, Estimated: >60 mL/min (ref >60)
Glucose, Bld: 125 mg/dL — ABNORMAL HIGH (ref 70–99)
Potassium: 3.2 mmol/L — ABNORMAL LOW (ref 3.5–5.1)
Sodium: 141 mmol/L (ref 135–145)

## 2023-07-08 MED ORDER — POTASSIUM CHLORIDE CRYS ER 20 MEQ PO TBCR: 40.0000 meq | EXTENDED_RELEASE_TABLET | Freq: Every day | ORAL | 0 refills | Status: AC

## 2023-07-08 MED ORDER — TAMSULOSIN HCL 0.4 MG PO CAPS: 0.4000 mg | ORAL_CAPSULE | Freq: Every day | ORAL | 0 refills | Status: AC

## 2023-07-08 MED ORDER — OXYCODONE HCL 5 MG PO TABS: 5.0000 mg | ORAL_TABLET | Freq: Four times a day (QID) | ORAL | 0 refills | Status: DC | PRN

## 2023-07-08 MED ORDER — OXYBUTYNIN CHLORIDE 5 MG PO TABS: 5.0000 mg | ORAL_TABLET | Freq: Three times a day (TID) | ORAL | 0 refills | Status: DC

## 2023-07-08 MED ORDER — CEFDINIR 300 MG PO CAPS: 300.0000 mg | ORAL_CAPSULE | Freq: Two times a day (BID) | ORAL | 0 refills | Status: AC

## 2023-07-08 MED ORDER — OXYCODONE HCL 5 MG PO TABS
5.0000 mg | ORAL_TABLET | Freq: Four times a day (QID) | ORAL | 0 refills | Status: DC | PRN
  Filled 2023-07-08: qty 10, 3d supply, fill #0

## 2023-07-08 MED ORDER — POTASSIUM CHLORIDE CRYS ER 20 MEQ PO TBCR
40.0000 meq | EXTENDED_RELEASE_TABLET | ORAL | Status: DC
  Administered 2023-07-08: 40 meq via ORAL
  Filled 2023-07-08: qty 2

## 2023-07-08 NOTE — Discharge Summary (Signed)
 Physician Discharge Summary   [name]  female DOB: February 06, 1951  ZOX:[id number]  PCP[facility]  Admit date: 07/04/2023 Discharge date: 07/08/2023  Admitted From: home Disposition:  home CODE STATUS: Full code  Discharge Instructions     Discharge instructions   Complete by: As directed    You have received 5 days of IV antibiotic.  Please finish 5 more days of oral antibiotic cefdinir starting tomorrow 07/09/23.  I have prescribed you 1 week of oxybutynin  and tamsulosin , and oxycodone  as needed, to help with stent pain.  You will follow up with urology as outpatient for kidney stone management, as scheduled.  If you see blood in your urine or your urine is turning red, please hold your Eliquis  until you see urologist.  Your potassium level has been low, so I have prescribed you 1 week of potassium supplement. [facility] Course:  For full details, please see H&P, progress notes, consult notes and ancillary notes.  Briefly,  [name] is a 73 y.o. female with medical history significant of chronic A-flutter status post ablation recently, on Eliquis , PAD status post stenting, HTN, CAD, cigarette smoking, presented with sudden onset of left flank pain.    Acute left-sided obstructive ureteral stone with acute left-sided obstructive uropathy and hydronephrosis S/p stent placement on 07/05/23 --recieved MIVF --cont oxybutynin  and Flomax  scheduled for left flank pain  --oxycodone  PRN --plan for outpatient urology f/u for possible ESWL   Hematuria --mild hematuria on presentation, resolved prior to discharge. --resume Eliquis  after discharge.  Pt advised to hold Eliquis  if hematuria returns.   Probably complicated UTI Possible left-sided pyelonephritis - Secondary to left-sided obstructive ureteral stone --urine cx obtained on presentation with multiple species.  Urine cx collected again during cystoscopy neg growth --pt received 5 days  of ceftriaxone  and discharged on 5 more days of cefdinir, per urology rec.   PVD with recent stenting - No acute concern --cont ASA and statin   HTN, uncontrolled --cont amlodipine  and losartan    Hypothyroidism - Continue Synthroid    PAF status post ablation - In sinus rhythm --Eliquis  held during hospitalization, to be resumed after discharge if no hematuria.  Hypokalemia --required daily supplementation. --discharged on potassium 40 mEq for 1 week.   Discharge Diagnoses:  Principal Problem:   Nephrolithiasis Active Problems:   Acute pyelonephritis   Renal colic on left side   Hydronephrosis   30 Day Unplanned Readmission Risk Score    Flowsheet Row ED to Hosp-Admission (Current) from 07/04/2023 in [facility] (1A)  30 Day Unplanned Readmission Risk Score (%) 23.23 Filed at 07/08/2023 0801       This score is the patient's risk of an unplanned readmission within 30 days of being discharged (0 -100%). The score is based on dignosis, age, lab data, medications, orders, and past utilization.   Low:  0-14.9   Medium: 15-21.9   High: 22-29.9   Extreme: 30 and above         Discharge Instructions:  Allergies as of 07/08/2023       Reactions   Lisinopril  Swelling   Swelling of lips and face.    Tizanidine  Other (See Comments)   bradycardia   Amoxicillin  Itching, Rash   Latex Itching, Rash        Medication List     TAKE these medications    acetaminophen  650 MG CR tablet Commonly known as: TYLENOL  Take 650-1,300 mg  by mouth every 8 (eight) hours as needed for pain.   albuterol  108 (90 Base) MCG/ACT inhaler Commonly known as: VENTOLIN  HFA Inhale 1-2 puffs into the lungs every 4 (four) hours as needed for wheezing or shortness of breath. Inhale 2-4 puffs by mouth every 4 hours as needed for wheezing, cough, and/or shortness of breath   amLODipine  10 MG tablet Commonly known as: NORVASC  Take 1 tablet (10 mg total) by  mouth daily.   apixaban  5 MG Tabs tablet Commonly known as: ELIQUIS  Take 1 tablet (5 mg total) by mouth 2 (two) times daily.   ascorbic acid 500 MG tablet Commonly known as: VITAMIN C Take 500 mg by mouth daily.   aspirin  EC 81 MG tablet Take 1 tablet (81 mg total) by mouth daily. Swallow whole.   atorvastatin  10 MG tablet Commonly known as: Lipitor Take 1 tablet (10 mg total) by mouth daily.   benzonatate  100 MG capsule Commonly known as: Tessalon  Perles Take 1-2 tabs TID prn cough   cefdinir 300 MG capsule Commonly known as: OMNICEF Take 1 capsule (300 mg total) by mouth 2 (two) times daily for 5 days. Start taking on: July 09, 2023   cholecalciferol  1000 units tablet Commonly known as: VITAMIN D  Take 1,000 Units by mouth daily.   fluticasone  furoate-vilanterol 100-25 MCG/ACT Aepb Commonly known as: Breo Ellipta  Inhale 1 puff into the lungs daily.   levothyroxine  100 MCG tablet Commonly known as: SYNTHROID  Take 100 mcg by mouth daily before breakfast.   lidocaine  5 % ointment Commonly known as: XYLOCAINE  Apply 1 Application topically as needed.   loratadine  10 MG tablet Commonly known as: Claritin  Take 1 tablet (10 mg total) by mouth every evening for 10 days.   losartan  100 MG tablet Commonly known as: COZAAR  Take 1 tablet (100 mg total) by mouth daily.   oxybutynin  5 MG tablet Commonly known as: DITROPAN  Take 1 tablet (5 mg total) by mouth 3 (three) times daily for 7 days.   oxyCODONE  5 MG immediate release tablet Commonly known as: Oxy IR/ROXICODONE  Take 1 tablet (5 mg total) by mouth every 6 (six) hours as needed for severe pain (pain score 7-10).   pantoprazole  40 MG tablet Commonly known as: Protonix  Take 1 tablet (40 mg total) by mouth daily.   potassium chloride  SA 20 MEQ tablet Commonly known as: KLOR-CON  M Take 2 tablets (40 mEq total) by mouth daily for 7 days.   tamsulosin  0.4 MG Caps capsule Commonly known as: FLOMAX  Take 1 capsule  (0.4 mg total) by mouth daily for 7 days.         Follow-up [employer], [facility] Follow up in 1 week(s).   Contact information: [address] [place] [address] [phone]                 Allergies  Allergen Reactions   Lisinopril  Swelling    Swelling of lips and face.    Tizanidine  Other (See Comments)    bradycardia   Amoxicillin  Itching and Rash   Latex Itching and Rash     The results of significant diagnostics from this hospitalization (including imaging, microbiology, ancillary and laboratory) are listed below for reference.   Consultations:   Procedures/Studies: DG OR UROLOGY CYSTO IMAGE ([facility] ONLY) Result Date: 07/05/2023 There is no interpretation for this exam.  This order is for images obtained during a surgical procedure.  Please See "Surgeries" Tab for more information regarding the procedure.  CT Renal Stone Study Result Date: 07/04/2023 CLINICAL DATA:  Abdominal pain/flank pain EXAM: CT ABDOMEN AND PELVIS WITHOUT CONTRAST TECHNIQUE: Multidetector CT imaging of the abdomen and pelvis was performed following the standard protocol without IV contrast. RADIATION DOSE REDUCTION: This exam was performed according to the departmental dose-optimization program which includes automated exposure control, adjustment of the mA and/or kV according to patient size and/or use of iterative reconstruction technique. COMPARISON:  Abdominal sonogram 09/11/2020 FINDINGS: Lower chest: Scarring noted within the lung bases. No pleural fluid or consolidative change. Cardiac enlargement. Coronary artery and aortic atherosclerotic calcifications. Hepatobiliary: No suspicious liver abnormality. Focal fatty deposition noted about the inferior aspect of the falciform ligament. Gallstones are identified. No gallbladder wall thickening or pericholecystic inflammation. No bile duct dilatation. Pancreas: Unremarkable. No pancreatic ductal dilatation or  surrounding inflammatory changes. Spleen: Normal in size without focal abnormality. Adrenals/Urinary Tract: Normal adrenal glands. 2 stones identified within the right renal collecting system. These measure up to 2 mm. No right-sided obstructive uropathy. Upper pole right kidney cyst measures 1.1 cm. No follow-up imaging recommended. Left-sided hydronephrosis with perinephric soft tissue stranding. At the right UPJ there is a stone measuring 9 mm, image 38/2 and coronal image 52/5. The left ureter is decompressed beyond the UPJ. Exam detail within the pelvis is diminished due to streak artifact from right hip arthroplasty device. Within this limitation, no focal bladder abnormality noted. Stomach/Bowel: Stomach appears normal. The appendix is visualized and is normal. No bowel wall thickening, inflammation or distension. Vascular/Lymphatic: Aortic atherosclerosis. No aneurysm. No abdominopelvic adenopathy. Reproductive: Status post hysterectomy. No adnexal masses. Other: No free fluid or fluid collections. Small fat containing umbilical hernia. No free fluid or fluid collections. No signs of pneumoperitoneum. Musculoskeletal: Status post right hip arthroplasty. Severe degenerative changes noted in the left hip. No acute abnormality. IMPRESSION: 1. Left-sided hydronephrosis with perinephric soft tissue stranding. There is a 9 mm stone at the left UPJ. 2. Nonobstructing right renal calculi. 3. Cholelithiasis. 4. Severe degenerative changes in the left hip. 5.  Aortic Atherosclerosis (ICD10-I70.0). Electronically Signed   By: Kimberley Penman M.D.   On: 07/04/2023 05:48   DG Chest 2 View Result Date: 06/23/2023 CLINICAL DATA:  Dry cough. EXAM: CHEST - 2 VIEW COMPARISON:  June 07, 2023 FINDINGS: The cardiac silhouette is mildly enlarged and unchanged in size. There is moderate severity calcification of the aortic arch. Mild, stable linear scarring and/or atelectasis is seen within the left lung base. There is no  evidence of focal consolidation, pleural effusion or pneumothorax. The visualized skeletal structures are unremarkable. IMPRESSION: Stable cardiomegaly with mild, stable left basilar linear scarring and/or atelectasis. Electronically Signed   By: Virgle Grime M.D.   On: 06/23/2023 04:33   PERIPHERAL VASCULAR CATHETERIZATION Result Date: 06/20/2023 See surgical note for result.  VAS US  ABI WITH/WO TBI Result Date: 06/13/2023  LOWER EXTREMITY DOPPLER STUDY Patient Name:  [name]  Date of Exam:   06/10/2023 Medical Rec #: [id number]        Accession #:    [id number] Date of Birth: 1950/04/02        Patient Gender: F Patient Age:   66 years Exam Location:  [facility] Procedure:      VAS US  ABI WITH/WO TBI Referring Phys: KEVIN PATEL --------------------------------------------------------------------------------  Indications: Claudication, rest pain, and peripheral artery disease. High Risk Factors: Hypertension, past history of smoking, coronary artery  disease. Other Factors: Broken right great toe nail after hitting her foot on furniture.  Performing Technologist: Oneta Bilberry RVT  Examination Guidelines: A complete evaluation includes at minimum, Doppler waveform signals and systolic blood pressure reading at the level of bilateral brachial, anterior tibial, and posterior tibial arteries, when vessel segments are accessible. Bilateral testing is considered an integral part of a complete examination. Photoelectric Plethysmograph (PPG) waveforms and toe systolic pressure readings are included as required and additional duplex testing as needed. Limited examinations for reoccurring indications may be performed as noted.  ABI Findings: +---------+------------------+-----+----------+--------+ Right    Rt Pressure (mmHg)IndexWaveform  Comment  +---------+------------------+-----+----------+--------+ Brachial 163                                        +---------+------------------+-----+----------+--------+ PTA      70                0.42 monophasic         +---------+------------------+-----+----------+--------+ DP       63                0.38 monophasic         +---------+------------------+-----+----------+--------+ Great Toe13                0.08                    +---------+------------------+-----+----------+--------+ +---------+------------------+-----+----------+-------+ Left     Lt Pressure (mmHg)IndexWaveform  Comment +---------+------------------+-----+----------+-------+ Brachial 167                                      +---------+------------------+-----+----------+-------+ PTA      130               0.78 monophasic        +---------+------------------+-----+----------+-------+ DP       131               0.78 monophasic        +---------+------------------+-----+----------+-------+ Great Toe70                0.42                   +---------+------------------+-----+----------+-------+ +-------+-----------+-----------+------------+------------+ ABI/TBIToday's ABIToday's TBIPrevious ABIPrevious TBI +-------+-----------+-----------+------------+------------+ Right  0.42       0.08                                +-------+-----------+-----------+------------+------------+ Left   0.78       0.42                                +-------+-----------+-----------+------------+------------+  Limited imaging showed patent popliteal artery with monophasic Doppler waveform.  Summary: Right: Resting right ankle-brachial index indicates severe right lower extremity arterial disease. The right toe-brachial index is abnormal. Left: Resting left ankle-brachial index indicates moderate left lower extremity arterial disease. The left toe-brachial index is abnormal. *See table(s) above for measurements and observations.  Electronically signed by Mikki Alexander MD on 06/13/2023 at 2:29:10 PM.    Final        Labs: BNP (last 3 results) Recent Labs    06/07/23 0602 06/12/23 0010  BNP 146.3* 177.4*   Basic  Metabolic Panel: Recent Labs  Lab 07/04/23 0351 07/06/23 0326 07/07/23 0243 07/08/23 0121  NA 141 140 146* 141  K 3.7 3.1* 3.0* 3.2*  CL 108 108 110 109  CO2 26 25 25 25   GLUCOSE 141* 125* 103* 125*  BUN 15 13 13  7*  CREATININE 0.97 1.01* 0.89 0.64  CALCIUM  8.8* 8.2* 8.1* 8.3*  MG  --  2.0  --   --    Liver Function Tests: Recent Labs  Lab 07/04/23 0351  AST 22  ALT 12  ALKPHOS 53  BILITOT 0.5  PROT 6.8  ALBUMIN 3.8   Recent Labs  Lab 07/04/23 0351  LIPASE 23   No results for input(s): "AMMONIA" in the last 168 hours. CBC: Recent Labs  Lab 07/04/23 0351 07/06/23 0326 07/07/23 0243 07/08/23 0121  WBC 9.1 8.1 8.2 7.3  NEUTROABS 6.8  --   --   --   HGB 10.3* 10.3* 9.6* 10.4*  HCT 33.7* 32.6* 31.5* 33.9*  MCV 76.6* 74.9* 77.8* 76.0*  PLT 295 264 250 278   Cardiac Enzymes: No results for input(s): "CKTOTAL", "CKMB", "CKMBINDEX", "TROPONINI" in the last 168 hours. BNP: Invalid input(s): "POCBNP" CBG: No results for input(s): "GLUCAP" in the last 168 hours. D-Dimer No results for input(s): "DDIMER" in the last 72 hours. Hgb A1c No results for input(s): "HGBA1C" in the last 72 hours. Lipid Profile No results for input(s): "CHOL", "HDL", "LDLCALC", "TRIG", "CHOLHDL", "LDLDIRECT" in the last 72 hours. Thyroid  function studies No results for input(s): "TSH", "T4TOTAL", "T3FREE", "THYROIDAB" in the last 72 hours.  Invalid input(s): "FREET3" Anemia work up No results for input(s): "VITAMINB12", "FOLATE", "FERRITIN", "TIBC", "IRON", "RETICCTPCT" in the last 72 hours. Urinalysis    Component Value Date/Time   COLORURINE AMBER (A) 07/05/2023 1330   APPEARANCEUR HAZY (A) 07/05/2023 1330   LABSPEC 1.004 (L) 07/05/2023 1330   PHURINE 6.0 07/05/2023 1330   GLUCOSEU 50 (A) 07/05/2023 1330   HGBUR LARGE (A) 07/05/2023 1330   BILIRUBINUR NEGATIVE  07/05/2023 1330   KETONESUR NEGATIVE 07/05/2023 1330   PROTEINUR 100 (A) 07/05/2023 1330   NITRITE NEGATIVE 07/05/2023 1330   LEUKOCYTESUR TRACE (A) 07/05/2023 1330   Sepsis Labs Recent Labs  Lab 07/04/23 0351 07/06/23 0326 07/07/23 0243 07/08/23 0121  WBC 9.1 8.1 8.2 7.3   Microbiology Recent Results (from the past 240 hours)  Urine Culture (for pregnant, neutropenic or urologic patients or patients with an indwelling urinary catheter)     Status: Abnormal   Collection Time: 07/04/23  5:39 AM   Specimen: Urine, Clean Catch  Result Value Ref Range Status   Specimen Description   Final    URINE, CLEAN CATCH Performed at [facility], [address], [place], [place] [address]    Special Requests   Final    NONE Performed at [facility], [address] [place], [place] [address]    Culture MULTIPLE SPECIES PRESENT, SUGGEST RECOLLECTION (A)  Final   Report Status 07/05/2023 FINAL  Final  Urine Culture     Status: None   Collection Time: 07/05/23  1:30 PM   Specimen: Urine, Cystoscope  Result Value Ref Range Status   Specimen Description   Final    CYSTOSCOPY Performed at [facility], [address], [place], [place] [address]    Special Requests   Final    urine culture Performed at [facility], [address] [place], [place] [address]    Culture   Final  NO GROWTH Performed at [facility], [address] [place], [place] [address]    Report Status 07/06/2023 FINAL  Final     Total time spend on discharging this patient, including the last patient exam, discussing the hospital stay, instructions for ongoing care as it relates to all pertinent caregivers, as well as preparing the medical discharge records, prescriptions, and/or referrals as applicable, is 40 minutes.    Garrison Kanner, MD  [facility] 07/08/2023, 8:23 AM

## 2023-07-08 NOTE — Care Management Important Message (Signed)
 Important Message  Patient Details  Name: [name] MRN: [id number] Date of Birth: 16-Aug-1950   Important Message Given:  Yes - Medicare IM     Floriene Jeschke W, CMA 07/08/2023, 10:01 AM

## 2023-07-11 ENCOUNTER — Other Ambulatory Visit: Payer: Self-pay

## 2023-07-11 ENCOUNTER — Inpatient Hospital Stay
Admission: EM | Admit: 2023-07-11 | Discharge: 2023-07-14 | DRG: 694 | Disposition: A | Discharge status: Home / Self Care | Attending: Obstetrics and Gynecology | Admitting: Obstetrics and Gynecology

## 2023-07-11 ENCOUNTER — Emergency Department

## 2023-07-11 ENCOUNTER — Other Ambulatory Visit (INDEPENDENT_AMBULATORY_CARE_PROVIDER_SITE_OTHER): Payer: Self-pay | Admitting: Vascular Surgery

## 2023-07-11 DIAGNOSIS — Z79890 Hormone replacement therapy (postmenopausal): Secondary | ICD-10-CM

## 2023-07-11 DIAGNOSIS — I739 Peripheral vascular disease, unspecified: Secondary | ICD-10-CM

## 2023-07-11 DIAGNOSIS — I447 Left bundle-branch block, unspecified: Secondary | ICD-10-CM | POA: Diagnosis present

## 2023-07-11 DIAGNOSIS — Z960 Presence of urogenital implants: Secondary | ICD-10-CM

## 2023-07-11 DIAGNOSIS — F17200 Nicotine dependence, unspecified, uncomplicated: Secondary | ICD-10-CM | POA: Diagnosis present

## 2023-07-11 DIAGNOSIS — Z888 Allergy status to other drugs, medicaments and biological substances status: Secondary | ICD-10-CM

## 2023-07-11 DIAGNOSIS — N23 Unspecified renal colic: Secondary | ICD-10-CM | POA: Diagnosis present

## 2023-07-11 DIAGNOSIS — Z7982 Long term (current) use of aspirin: Secondary | ICD-10-CM

## 2023-07-11 DIAGNOSIS — M7918 Myalgia, other site: Secondary | ICD-10-CM | POA: Diagnosis present

## 2023-07-11 DIAGNOSIS — Z96641 Presence of right artificial hip joint: Secondary | ICD-10-CM | POA: Diagnosis present

## 2023-07-11 DIAGNOSIS — Z87891 Personal history of nicotine dependence: Secondary | ICD-10-CM

## 2023-07-11 DIAGNOSIS — I10 Essential (primary) hypertension: Secondary | ICD-10-CM | POA: Diagnosis present

## 2023-07-11 DIAGNOSIS — R109 Unspecified abdominal pain: Principal | ICD-10-CM

## 2023-07-11 DIAGNOSIS — M1990 Unspecified osteoarthritis, unspecified site: Secondary | ICD-10-CM | POA: Diagnosis present

## 2023-07-11 DIAGNOSIS — Z7901 Long term (current) use of anticoagulants: Secondary | ICD-10-CM

## 2023-07-11 DIAGNOSIS — N390 Urinary tract infection, site not specified: Secondary | ICD-10-CM

## 2023-07-11 DIAGNOSIS — I4892 Unspecified atrial flutter: Secondary | ICD-10-CM | POA: Diagnosis present

## 2023-07-11 DIAGNOSIS — E559 Vitamin D deficiency, unspecified: Secondary | ICD-10-CM | POA: Diagnosis present

## 2023-07-11 DIAGNOSIS — K219 Gastro-esophageal reflux disease without esophagitis: Secondary | ICD-10-CM | POA: Diagnosis present

## 2023-07-11 DIAGNOSIS — Z90710 Acquired absence of both cervix and uterus: Secondary | ICD-10-CM

## 2023-07-11 DIAGNOSIS — Z79899 Other long term (current) drug therapy: Secondary | ICD-10-CM

## 2023-07-11 DIAGNOSIS — Z880 Allergy status to penicillin: Secondary | ICD-10-CM

## 2023-07-11 DIAGNOSIS — K8020 Calculus of gallbladder without cholecystitis without obstruction: Secondary | ICD-10-CM | POA: Diagnosis present

## 2023-07-11 DIAGNOSIS — E038 Other specified hypothyroidism: Secondary | ICD-10-CM | POA: Diagnosis present

## 2023-07-11 DIAGNOSIS — E039 Hypothyroidism, unspecified: Secondary | ICD-10-CM | POA: Diagnosis present

## 2023-07-11 DIAGNOSIS — R1032 Left lower quadrant pain: Secondary | ICD-10-CM | POA: Diagnosis not present

## 2023-07-11 DIAGNOSIS — Z8249 Family history of ischemic heart disease and other diseases of the circulatory system: Secondary | ICD-10-CM

## 2023-07-11 DIAGNOSIS — N202 Calculus of kidney with calculus of ureter: Secondary | ICD-10-CM | POA: Diagnosis not present

## 2023-07-11 DIAGNOSIS — I2510 Atherosclerotic heart disease of native coronary artery without angina pectoris: Secondary | ICD-10-CM

## 2023-07-11 DIAGNOSIS — R52 Pain, unspecified: Secondary | ICD-10-CM

## 2023-07-11 DIAGNOSIS — Z7951 Long term (current) use of inhaled steroids: Secondary | ICD-10-CM

## 2023-07-11 DIAGNOSIS — Z8701 Personal history of pneumonia (recurrent): Secondary | ICD-10-CM

## 2023-07-11 DIAGNOSIS — Z91040 Latex allergy status: Secondary | ICD-10-CM

## 2023-07-11 DIAGNOSIS — K760 Fatty (change of) liver, not elsewhere classified: Secondary | ICD-10-CM | POA: Diagnosis present

## 2023-07-11 LAB — URINALYSIS, ROUTINE W REFLEX MICROSCOPIC
Bacteria, UA: NONE SEEN
Bilirubin Urine: NEGATIVE
Glucose, UA: NEGATIVE mg/dL
Ketones, ur: NEGATIVE mg/dL
Nitrite: NEGATIVE
Protein, ur: 100 mg/dL — AB
RBC / HPF: >50 RBC/hpf (ref 0–5)
Specific Gravity, Urine: 1.013 (ref 1.005–1.030)
pH: 7 (ref 5.0–8.0)

## 2023-07-11 LAB — CBC
HCT: 35 % — ABNORMAL LOW (ref 36.0–46.0)
Hemoglobin: 10.8 g/dL — ABNORMAL LOW (ref 12.0–15.0)
MCH: 23.8 pg — ABNORMAL LOW (ref 26.0–34.0)
MCHC: 30.9 g/dL (ref 30.0–36.0)
MCV: 77.1 fL — ABNORMAL LOW (ref 80.0–100.0)
Platelets: 294 10*3/uL (ref 150–400)
RBC: 4.54 MIL/uL (ref 3.87–5.11)
RDW: 15 % (ref 11.5–15.5)
WBC: 8.1 10*3/uL (ref 4.0–10.5)
nRBC: 0 % (ref 0.0–0.2)

## 2023-07-11 LAB — BASIC METABOLIC PANEL WITH GFR
Anion gap: 6 (ref 5–15)
BUN: 13 mg/dL (ref 8–23)
CO2: 26 mmol/L (ref 22–32)
Calcium: 8.9 mg/dL (ref 8.9–10.3)
Chloride: 109 mmol/L (ref 98–111)
Creatinine, Ser: 0.81 mg/dL (ref 0.44–1.00)
GFR, Estimated: >60 mL/min (ref >60)
Glucose, Bld: 110 mg/dL — ABNORMAL HIGH (ref 70–99)
Potassium: 3.8 mmol/L (ref 3.5–5.1)
Sodium: 141 mmol/L (ref 135–145)

## 2023-07-11 MED ORDER — KETOROLAC TROMETHAMINE 15 MG/ML IJ SOLN
15.0000 mg | Freq: Once | INTRAMUSCULAR | Status: AC
Start: 2023-07-11 — End: 2023-07-11
  Administered 2023-07-11: 15 mg via INTRAVENOUS
  Filled 2023-07-11: qty 1

## 2023-07-11 MED ORDER — ONDANSETRON HCL 4 MG/2ML IJ SOLN
4.0000 mg | Freq: Once | INTRAMUSCULAR | Status: AC
  Administered 2023-07-11: 4 mg via INTRAVENOUS
  Filled 2023-07-11: qty 2

## 2023-07-11 MED ORDER — MORPHINE SULFATE (PF) 4 MG/ML IV SOLN
6.0000 mg | Freq: Once | INTRAVENOUS | Status: AC
  Administered 2023-07-11: 6 mg via INTRAVENOUS
  Filled 2023-07-11: qty 2

## 2023-07-11 MED ORDER — SODIUM CHLORIDE 0.9 % IV BOLUS
1000.0000 mL | Freq: Once | INTRAVENOUS | Status: AC
  Administered 2023-07-11: 1000 mL via INTRAVENOUS

## 2023-07-11 NOTE — ED Provider Notes (Signed)
 [facility] Provider Note    Event Date/Time   First MD Initiated Contact with Patient 07/11/23 2010     (approximate)   History   Flank Pain (Pt brought in by EMS for left sided flank, LLQ pain (10/10).  Pt was seen last week for a kidney stone and had a stent placed.  Symptoms resolved, but came back today.  Pt reporting nausea and hematuria.  )   HPI  [name] is a 73 y.o. female past medical history significant for recent kidney stone who presents to the emergency department for left-sided flank pain.  Patient states that she was recently admitted to the hospital for a kidney stone and had a stent placement with urology.  She was discharged on Friday and was doing well over the weekend.  States that she had severe sudden onset of pain to her left side that has been intermittent.  States that it takes the breath out of her and she has nausea and has almost thrown up because the pain is so severe.  Pain is similar from whenever she was in the hospital.  Denies any dysuria, urinary urgency or frequency.  Denies fever or chills.  No falls or trauma.  On chart review patient had a recent hospitalization and was evaluated by urology for a 9 mm kidney stone.  Patient had a stent placed.  She was started on antibiotics.  Patient had multiple episodes of severe pain that was thought to be secondary to stent placement was causing her to vomit prior to discharge.     Physical Exam   Triage Vital Signs: ED Triage Vitals  Encounter Vitals Group     BP 07/11/23 2019 (!) 156/90     Systolic BP Percentile --      Diastolic BP Percentile --      Pulse Rate 07/11/23 2019 79     Resp --      Temp 07/11/23 2019 98 F (36.7 C)     Temp Source 07/11/23 2019 Oral     SpO2 07/11/23 2019 100 %     Weight 07/11/23 2022 176 lb (79.8 kg)     Height 07/11/23 2022 5\' 5"  (1.651 m)     Head Circumference --      Peak Flow --      Pain Score 07/11/23 2022 10     Pain Loc  --      Pain Education --      Exclude from Growth Chart --     Most recent vital signs: Vitals:   07/11/23 2200 07/11/23 2330  BP: 118/89 (!) 132/57  Pulse: 62 66  Resp: 18 18  Temp:    SpO2: 96% 94%    Physical Exam Constitutional:      General: She is in acute distress.     Appearance: She is well-developed.  HENT:     Head: Atraumatic.  Eyes:     Conjunctiva/sclera: Conjunctivae normal.  Cardiovascular:     Rate and Rhythm: Regular rhythm.  Pulmonary:     Effort: No respiratory distress.  Abdominal:     General: There is no distension.     Tenderness: There is abdominal tenderness. There is left CVA tenderness.  Musculoskeletal:        General: Normal range of motion.     Cervical back: Normal range of motion.  Skin:    General: Skin is warm.     Capillary Refill: Capillary refill takes less  than 2 seconds.  Neurological:     Mental Status: She is alert. Mental status is at baseline.     IMPRESSION / MDM / ASSESSMENT AND PLAN / ED COURSE  I reviewed the triage vital signs and the nursing notes.  Differential diagnosis including kidney stone, pyelonephritis, pain secondary to kidney stone stent.  Low suspicion for ACS or dissection.  Have a low suspicion for pulmonary embolism, no pleuritic pain. RADIOLOGY CT scan without contrast -stent and placed but no hydronephrosis  LABS (all labs ordered are listed, but only abnormal results are displayed) Labs interpreted as -    Labs Reviewed  BASIC METABOLIC PANEL WITH GFR - Abnormal; Notable for the following components:      Result Value   Glucose, Bld 110 (*)    All other components within normal limits  CBC - Abnormal; Notable for the following components:   Hemoglobin 10.8 (*)    HCT 35.0 (*)    MCV 77.1 (*)    MCH 23.8 (*)    All other components within normal limits  URINALYSIS, ROUTINE W REFLEX MICROSCOPIC - Abnormal; Notable for the following components:   Color, Urine YELLOW (*)    APPearance  HAZY (*)    Hgb urine dipstick LARGE (*)    Protein, ur 100 (*)    Leukocytes,Ua LARGE (*)    All other components within normal limits  URINE CULTURE     MDM  CT scan with stent to the left ureter with 6 mm stone with resolution of hydronephrosis.  Cholelithiasis.  Given multiple doses of pain medication and continued to have ongoing pain.  Urine without signs of urinary tract infection.  Plan to give another repeat dose of IV pain medication given ongoing pain.  Will give IV ketorolac  and IV lidocaine .  If having ongoing pain likely secondary to the stent and may need admission for intractable pain.  Otherwise we will be able to discharge home if pain is well-controlled for ongoing urology follow-up.  Care transferred to incoming provider.     PROCEDURES:  Critical Care performed: No  Procedures  Patient's presentation is most consistent with acute complicated illness / injury requiring diagnostic workup.   MEDICATIONS ORDERED IN ED: Medications  lidocaine  (XYLOCAINE ) 120 mg in sodium chloride  0.9 % 100 mL IVPB (has no administration in time range)  cefTRIAXone  (ROCEPHIN ) 1 g in sodium chloride  0.9 % 100 mL IVPB (has no administration in time range)  morphine  (PF) 4 MG/ML injection 6 mg (6 mg Intravenous Given 07/11/23 2109)  ondansetron  (ZOFRAN ) injection 4 mg (4 mg Intravenous Given 07/11/23 2108)  sodium chloride  0.9 % bolus 1,000 mL (0 mLs Intravenous Stopped 07/11/23 2316)  ketorolac  (TORADOL ) 15 MG/ML injection 15 mg (15 mg Intravenous Given 07/11/23 2309)  morphine  (PF) 4 MG/ML injection 6 mg (6 mg Intravenous Given 07/11/23 2309)    FINAL CLINICAL IMPRESSION(S) / ED DIAGNOSES   Final diagnoses:  Left flank pain     Rx / DC Orders   ED Discharge Orders     None        Note:  This document was prepared using Dragon voice recognition software and may include unintentional dictation errors.   Viviano Ground, MD 07/12/23 0010

## 2023-07-11 NOTE — ED Triage Notes (Signed)
 Pt brought in by EMS for left sided flank, LLQ pain (10/10).  Pt was seen last week for a kidney stone and had a stent placed.  Symptoms resolved, but came back today with worsening symptoms.  Pt reporting pain, nausea and hematuria.

## 2023-07-12 DIAGNOSIS — Z7901 Long term (current) use of anticoagulants: Secondary | ICD-10-CM | POA: Diagnosis not present

## 2023-07-12 DIAGNOSIS — I447 Left bundle-branch block, unspecified: Secondary | ICD-10-CM | POA: Diagnosis present

## 2023-07-12 DIAGNOSIS — R1032 Left lower quadrant pain: Secondary | ICD-10-CM | POA: Diagnosis present

## 2023-07-12 DIAGNOSIS — N202 Calculus of kidney with calculus of ureter: Secondary | ICD-10-CM | POA: Diagnosis present

## 2023-07-12 DIAGNOSIS — Z888 Allergy status to other drugs, medicaments and biological substances status: Secondary | ICD-10-CM | POA: Diagnosis not present

## 2023-07-12 DIAGNOSIS — N390 Urinary tract infection, site not specified: Secondary | ICD-10-CM | POA: Diagnosis present

## 2023-07-12 DIAGNOSIS — E038 Other specified hypothyroidism: Secondary | ICD-10-CM | POA: Diagnosis present

## 2023-07-12 DIAGNOSIS — I4892 Unspecified atrial flutter: Secondary | ICD-10-CM | POA: Diagnosis present

## 2023-07-12 DIAGNOSIS — E039 Hypothyroidism, unspecified: Secondary | ICD-10-CM | POA: Diagnosis present

## 2023-07-12 DIAGNOSIS — Z880 Allergy status to penicillin: Secondary | ICD-10-CM | POA: Diagnosis not present

## 2023-07-12 DIAGNOSIS — K8020 Calculus of gallbladder without cholecystitis without obstruction: Secondary | ICD-10-CM | POA: Diagnosis present

## 2023-07-12 DIAGNOSIS — Z87891 Personal history of nicotine dependence: Secondary | ICD-10-CM | POA: Diagnosis not present

## 2023-07-12 DIAGNOSIS — K219 Gastro-esophageal reflux disease without esophagitis: Secondary | ICD-10-CM | POA: Diagnosis present

## 2023-07-12 DIAGNOSIS — I2510 Atherosclerotic heart disease of native coronary artery without angina pectoris: Secondary | ICD-10-CM

## 2023-07-12 DIAGNOSIS — Z90710 Acquired absence of both cervix and uterus: Secondary | ICD-10-CM | POA: Diagnosis not present

## 2023-07-12 DIAGNOSIS — Z96641 Presence of right artificial hip joint: Secondary | ICD-10-CM | POA: Diagnosis present

## 2023-07-12 DIAGNOSIS — I739 Peripheral vascular disease, unspecified: Secondary | ICD-10-CM | POA: Diagnosis present

## 2023-07-12 DIAGNOSIS — N23 Unspecified renal colic: Secondary | ICD-10-CM | POA: Diagnosis not present

## 2023-07-12 DIAGNOSIS — I10 Essential (primary) hypertension: Secondary | ICD-10-CM | POA: Diagnosis present

## 2023-07-12 DIAGNOSIS — M7918 Myalgia, other site: Secondary | ICD-10-CM | POA: Diagnosis present

## 2023-07-12 DIAGNOSIS — M1990 Unspecified osteoarthritis, unspecified site: Secondary | ICD-10-CM | POA: Diagnosis present

## 2023-07-12 DIAGNOSIS — K760 Fatty (change of) liver, not elsewhere classified: Secondary | ICD-10-CM | POA: Diagnosis present

## 2023-07-12 DIAGNOSIS — Z960 Presence of urogenital implants: Secondary | ICD-10-CM

## 2023-07-12 DIAGNOSIS — Z8249 Family history of ischemic heart disease and other diseases of the circulatory system: Secondary | ICD-10-CM | POA: Diagnosis not present

## 2023-07-12 DIAGNOSIS — E559 Vitamin D deficiency, unspecified: Secondary | ICD-10-CM | POA: Diagnosis present

## 2023-07-12 DIAGNOSIS — Z79890 Hormone replacement therapy (postmenopausal): Secondary | ICD-10-CM | POA: Diagnosis not present

## 2023-07-12 DIAGNOSIS — Z8701 Personal history of pneumonia (recurrent): Secondary | ICD-10-CM | POA: Diagnosis not present

## 2023-07-12 LAB — VITAMIN D 25 HYDROXY (VIT D DEFICIENCY, FRACTURES): Vit D, 25-Hydroxy: 21.23 ng/mL — ABNORMAL LOW (ref 30–100)

## 2023-07-12 LAB — IRON AND TIBC
Iron: 39 ug/dL (ref 28–170)
Saturation Ratios: 14 % (ref 10.4–31.8)
TIBC: 273 ug/dL (ref 250–450)
UIBC: 234 ug/dL

## 2023-07-12 LAB — VITAMIN B12: Vitamin B-12: 334 pg/mL (ref 180–914)

## 2023-07-12 LAB — FOLATE: Folate: 12.2 ng/mL (ref >5.9)

## 2023-07-12 MED ORDER — KETOROLAC TROMETHAMINE 30 MG/ML IJ SOLN
30.0000 mg | Freq: Four times a day (QID) | INTRAMUSCULAR | Status: DC | PRN
  Administered 2023-07-12 – 2023-07-14 (×4): 30 mg via INTRAVENOUS
  Filled 2023-07-12 (×4): qty 1

## 2023-07-12 MED ORDER — ACETAMINOPHEN 325 MG PO TABS
650.0000 mg | ORAL_TABLET | Freq: Four times a day (QID) | ORAL | Status: DC | PRN
  Administered 2023-07-12 – 2023-07-14 (×2): 650 mg via ORAL
  Filled 2023-07-12 (×2): qty 2

## 2023-07-12 MED ORDER — ONDANSETRON HCL 4 MG PO TABS: 4.0000 mg | ORAL_TABLET | Freq: Four times a day (QID) | ORAL | Status: DC | PRN

## 2023-07-12 MED ORDER — VITAMIN B-12 1000 MCG PO TABS
1000.0000 ug | ORAL_TABLET | Freq: Every day | ORAL | Status: DC
  Administered 2023-07-12 – 2023-07-14 (×3): 1000 ug via ORAL
  Filled 2023-07-12 (×3): qty 1

## 2023-07-12 MED ORDER — LOSARTAN POTASSIUM 50 MG PO TABS
100.0000 mg | ORAL_TABLET | Freq: Every day | ORAL | Status: DC
  Administered 2023-07-12 – 2023-07-14 (×3): 100 mg via ORAL
  Filled 2023-07-12 (×3): qty 2

## 2023-07-12 MED ORDER — OXYCODONE HCL 5 MG PO TABS
5.0000 mg | ORAL_TABLET | Freq: Four times a day (QID) | ORAL | Status: DC | PRN
  Administered 2023-07-13 (×3): 5 mg via ORAL
  Filled 2023-07-12 (×3): qty 1

## 2023-07-12 MED ORDER — APIXABAN 5 MG PO TABS
5.0000 mg | ORAL_TABLET | Freq: Two times a day (BID) | ORAL | Status: DC
  Administered 2023-07-12 – 2023-07-14 (×5): 5 mg via ORAL
  Filled 2023-07-12 (×5): qty 1

## 2023-07-12 MED ORDER — SODIUM CHLORIDE 0.9 % IV SOLN
2.0000 g | INTRAVENOUS | Status: DC
  Administered 2023-07-12 – 2023-07-13 (×2): 2 g via INTRAVENOUS
  Filled 2023-07-12 (×3): qty 20

## 2023-07-12 MED ORDER — PANTOPRAZOLE SODIUM 40 MG PO TBEC
40.0000 mg | DELAYED_RELEASE_TABLET | Freq: Every day | ORAL | Status: DC
  Administered 2023-07-12 – 2023-07-14 (×3): 40 mg via ORAL
  Filled 2023-07-12 (×3): qty 1

## 2023-07-12 MED ORDER — ASPIRIN 81 MG PO TBEC
81.0000 mg | DELAYED_RELEASE_TABLET | Freq: Every day | ORAL | Status: DC
  Administered 2023-07-12 – 2023-07-14 (×3): 81 mg via ORAL
  Filled 2023-07-12 (×3): qty 1

## 2023-07-12 MED ORDER — OXYBUTYNIN CHLORIDE 5 MG PO TABS
5.0000 mg | ORAL_TABLET | Freq: Three times a day (TID) | ORAL | Status: DC
  Administered 2023-07-12: 5 mg via ORAL
  Filled 2023-07-12 (×2): qty 1

## 2023-07-12 MED ORDER — LEVOTHYROXINE SODIUM 100 MCG PO TABS
100.0000 ug | ORAL_TABLET | Freq: Every day | ORAL | Status: DC
  Administered 2023-07-12 – 2023-07-14 (×3): 100 ug via ORAL
  Filled 2023-07-12 (×3): qty 1

## 2023-07-12 MED ORDER — ACETAMINOPHEN 650 MG RE SUPP
650.0000 mg | Freq: Four times a day (QID) | RECTAL | Status: DC | PRN
Start: 2023-07-12 — End: 2023-07-14

## 2023-07-12 MED ORDER — SODIUM CHLORIDE 0.9 % IV SOLN
1.5000 mg/kg | Freq: Once | INTRAVENOUS | Status: AC
  Administered 2023-07-12: 120 mg via INTRAVENOUS
  Filled 2023-07-12: qty 6

## 2023-07-12 MED ORDER — OXYBUTYNIN CHLORIDE 5 MG PO TABS
5.0000 mg | ORAL_TABLET | Freq: Three times a day (TID) | ORAL | Status: AC
  Administered 2023-07-12: 5 mg via ORAL
  Filled 2023-07-12: qty 1

## 2023-07-12 MED ORDER — ONDANSETRON HCL 4 MG/2ML IJ SOLN
4.0000 mg | Freq: Four times a day (QID) | INTRAMUSCULAR | Status: DC | PRN
  Administered 2023-07-12 – 2023-07-13 (×3): 4 mg via INTRAVENOUS
  Filled 2023-07-12 (×3): qty 2

## 2023-07-12 MED ORDER — OXYBUTYNIN CHLORIDE ER 10 MG PO TB24
10.0000 mg | ORAL_TABLET | Freq: Every day | ORAL | Status: DC
  Administered 2023-07-12 – 2023-07-13 (×2): 10 mg via ORAL
  Filled 2023-07-12 (×2): qty 1

## 2023-07-12 MED ORDER — HYDROMORPHONE HCL 1 MG/ML IJ SOLN
1.0000 mg | INTRAMUSCULAR | Status: DC | PRN
  Administered 2023-07-12: 1 mg via INTRAVENOUS
  Filled 2023-07-12: qty 1

## 2023-07-12 MED ORDER — NICOTINE 14 MG/24HR TD PT24
14.0000 mg | MEDICATED_PATCH | Freq: Every day | TRANSDERMAL | Status: DC
  Filled 2023-07-12 (×3): qty 1

## 2023-07-12 MED ORDER — SODIUM CHLORIDE 0.9 % IV SOLN
1.0000 g | Freq: Once | INTRAVENOUS | Status: AC
  Administered 2023-07-12: 1 g via INTRAVENOUS
  Filled 2023-07-12: qty 10

## 2023-07-12 MED ORDER — VITAMIN D (ERGOCALCIFEROL) 1.25 MG (50000 UNIT) PO CAPS
50000.0000 [IU] | ORAL_CAPSULE | ORAL | Status: DC
  Administered 2023-07-12: 50000 [IU] via ORAL
  Filled 2023-07-12: qty 1

## 2023-07-12 MED ORDER — FLUTICASONE FUROATE-VILANTEROL 100-25 MCG/ACT IN AEPB
1.0000 | INHALATION_SPRAY | Freq: Every day | RESPIRATORY_TRACT | Status: DC
  Administered 2023-07-12 – 2023-07-14 (×3): 1 via RESPIRATORY_TRACT
  Filled 2023-07-12: qty 28

## 2023-07-12 MED ORDER — ATORVASTATIN CALCIUM 10 MG PO TABS
10.0000 mg | ORAL_TABLET | Freq: Every day | ORAL | Status: DC
  Administered 2023-07-12 – 2023-07-14 (×3): 10 mg via ORAL
  Filled 2023-07-12 (×3): qty 1

## 2023-07-12 MED ORDER — TAMSULOSIN HCL 0.4 MG PO CAPS
0.4000 mg | ORAL_CAPSULE | Freq: Every day | ORAL | Status: DC
  Administered 2023-07-12 – 2023-07-14 (×3): 0.4 mg via ORAL
  Filled 2023-07-12 (×3): qty 1

## 2023-07-12 MED ORDER — ALBUTEROL SULFATE (2.5 MG/3ML) 0.083% IN NEBU: 2.5000 mg | INHALATION_SOLUTION | RESPIRATORY_TRACT | Status: DC | PRN

## 2023-07-12 MED ORDER — ALBUTEROL SULFATE HFA 108 (90 BASE) MCG/ACT IN AERS: 1.0000 | INHALATION_SPRAY | RESPIRATORY_TRACT | Status: DC | PRN

## 2023-07-12 NOTE — Assessment & Plan Note (Addendum)
 S/p left ureteral stent placement 07/05/2023 Complicated UTI Continue pain control with oxybutynin  and Flomax  Add Anaspaz as trial Continue Rocephin  to replace home cefdinir until 07/18/2023(micro from 4/21 showed multiple species with recommendation for recollection) Additional pain control as needed Consider urology consult in the a.m (patient is being evaluated to see whether she is a candidate for lithotripsy

## 2023-07-12 NOTE — Assessment & Plan Note (Addendum)
 PAD s/p RLE angioplasty/thrombectomy/stent 06/20/23 Continue aspirin , atorvastatin  and losartan 

## 2023-07-12 NOTE — H&P (Signed)
 History and Physical    Patient: [name] ZOX:[id number] DOB: 28-Mar-1950 DOA: 07/11/2023 DOS: the patient was seen and examined on 07/12/2023 PCP: [facility]  Patient coming from: Home  Chief Complaint:  Chief Complaint  Patient presents with   Flank Pain    Pt brought in by EMS for left sided flank, LLQ pain (10/10).  Pt was seen last week for a kidney stone and had a stent placed.  Symptoms resolved, but came back today.  Pt reporting nausea and hematuria.      HPI: ESM… FLAMMER is a 73 y.o. female with medical history significant for chronic A-flutter status post ablation 05/24/23, on Eliquis , PAD status post stenting, HTN, CAD, tobacco use disorder, recently hospitalized from 4/21 to 07/08/2023 with obstructive left ureteral stone with hydronephrosis undergoing stent placement on 07/05/2023, discharged with Flomax  and oxybutynin  due to persistent left-sided flank pain with plans for lithotripsy, who is being admitted for continued ongoing intractable left flank pain post-stent unrelieved with prescribed medications from her recent stay. She describes the pain as sharp and colicky/ spasm-like of severe intensity. She has associated nausea . Of note patient was also discharged on cefdinir for possible complicated UTI and Pyridium for dysuria.  Presently she denies fever or chills,diarrhea ED course and data review: BP 156/90 with otherwise normal vitals Urinalysis showing large hemoglobin, large leuks CBC unremarkable with WBC 8and baseline hemoglobin of 10.8 BMP unremarkable  CT renal stone study showing resolution of previously noted left-sided hydronephrosis, 6 mm calculus at left UPJ adjacent to the ureteral stent unchanged in position.  Among other findings please see report  Patient treated with morphine , Toradol , lidocaine  but continued to have intractable pain She was started on ceftriaxone  Hospitalist consulted for admission.     Review of Systems: As  mentioned in the history of present illness. All other systems reviewed and are negative.  Past Medical History:  Diagnosis Date   Anemia    Arthritis    Atrial flutter (HCC) 09/2015   a.) CHADS2VASc 2 (sex, HTN); b.) s/p DCCV 11/11/2015 (150 J); c.) s/p RFCA 01/17/2016; d.) rate/rhythm maintained without pharmacological intervention; no chronic anticoagulation.   Coronary artery calcification seen on CT scan    Diastolic dysfunction 08/23/2019   a.)  TTE 08/23/2019: EF 55-60%, severe LAE, mild-mod MR, PASP 46.2, G2DD   Dyspnea    Essential hypertension    GERD (gastroesophageal reflux disease)    Hepatic steatosis    Incomplete left bundle branch block (LBBB)    Pneumonia    as a child   Subclinical hypothyroidism    Tobacco abuse    Past Surgical History:  Procedure Laterality Date   ABDOMINAL HYSTERECTOMY     ABLATION OF DYSRHYTHMIC FOCUS  01/16/2016   ATRIAL FIBRILLATION ABLATION N/A 05/24/2023   Procedure: ATRIAL FIBRILLATION ABLATION;  Surgeon: Boyce Byes, MD;  Location: [facility] INVASIVE CV LAB;  Service: Cardiovascular;  Laterality: N/A;   CYSTOSCOPY WITH STENT PLACEMENT Left 07/05/2023   Procedure: CYSTOSCOPY, WITH STENT INSERTION;  Surgeon: [name], MD;  Location: [facility] ORS;  Service: Urology;  Laterality: Left;   ELECTROPHYSIOLOGIC STUDY N/A 11/11/2015   Procedure: CARDIOVERSION;  Surgeon: Devorah Fonder, MD;  Location: [facility] ORS;  Service: Cardiovascular;  Laterality: N/A;   ELECTROPHYSIOLOGIC STUDY N/A 01/16/2016   Procedure: A-Flutter Ablation;  Surgeon: Verona Goodwill, MD;  Location: [facility] LAB;  Service: Cardiovascular;  Laterality: N/A;   LOWER EXTREMITY ANGIOGRAPHY Right 06/20/2023  Procedure: Lower Extremity Angiography;  Surgeon: Celso College, MD;  Location: [facility] INVASIVE CV LAB;  Service: Cardiovascular;  Laterality: Right;   TOTAL HIP ARTHROPLASTY Right 12/10/2021   Procedure: TOTAL HIP ARTHROPLASTY ANTERIOR APPROACH;  Surgeon: Venus Ginsberg,  MD;  Location: [facility] ORS;  Service: Orthopedics;  Laterality: Right;   Social History:  reports that she quit smoking about 7 years ago. Her smoking use included cigarettes. She has never used smokeless tobacco. She reports current alcohol use. She reports that she does not use drugs.  Allergies  Allergen Reactions   Lisinopril  Swelling    Swelling of lips and face.    Tizanidine  Other (See Comments)    bradycardia   Amoxicillin  Itching and Rash   Latex Itching and Rash    Family History  Problem Relation Age of Onset   Hypertension Mother    Diabetes Mother    Diabetes Father    Diabetes Brother    Hypertension Brother    Hypertension Brother    Heart attack Brother    Diabetes Brother    Breast cancer Neg Hx     Prior to Admission medications   Medication Sig Start Date End Date Taking? Authorizing Provider  acetaminophen  (TYLENOL ) 650 MG CR tablet Take 650-1,300 mg by mouth every 8 (eight) hours as needed for pain.   Yes [provider]  albuterol  (VENTOLIN  HFA) 108 (90 Base) MCG/ACT inhaler Inhale 1-2 puffs into the lungs every 4 (four) hours as needed for wheezing or shortness of breath. Inhale 2-4 puffs by mouth every 4 hours as needed for wheezing, cough, and/or shortness of breath 06/08/23  Yes Althia Atlas, MD  amLODipine  (NORVASC ) 10 MG tablet Take 1 tablet (10 mg total) by mouth daily. 06/24/23 06/23/24 Yes Riddle, Suzann, NP  apixaban  (ELIQUIS ) 5 MG TABS tablet Take 1 tablet (5 mg total) by mouth 2 (two) times daily. 06/13/23  Yes Riddle, Suzann, NP  ascorbic acid (VITAMIN C) 500 MG tablet Take 500 mg by mouth daily.   Yes [provider]  aspirin  EC 81 MG tablet Take 1 tablet (81 mg total) by mouth daily. Swallow whole. 06/20/23 06/19/24 Yes Dew, Donald Frost, MD  atorvastatin  (LIPITOR) 10 MG tablet Take 1 tablet (10 mg total) by mouth daily. 06/20/23 06/19/24 Yes Celso College, MD  benzonatate  (TESSALON  PERLES) 100 MG capsule Take 1-2 tabs TID prn cough 06/23/23  Yes  Menshew, Raye Cai, PA-C  cefdinir (OMNICEF) 300 MG capsule Take 1 capsule (300 mg total) by mouth 2 (two) times daily for 5 days. 07/09/23 07/14/23 Yes Garrison Kanner, MD  cholecalciferol  (VITAMIN D ) 1000 units tablet Take 1,000 Units by mouth daily.   Yes [provider]  fluticasone  furoate-vilanterol (BREO ELLIPTA ) 100-25 MCG/ACT AEPB Inhale 1 puff into the lungs daily. 06/08/23 09/06/23 Yes Althia Atlas, MD  levothyroxine  (SYNTHROID ) 100 MCG tablet Take 100 mcg by mouth daily before breakfast.   Yes [provider]  lidocaine  (XYLOCAINE ) 5 % ointment Apply 1 Application topically as needed. 05/17/23  Yes Velma Ghazi, DPM  losartan  (COZAAR ) 100 MG tablet Take 1 tablet (100 mg total) by mouth daily. 09/23/22  Yes Furth, Cadence H, PA-C  oxybutynin  (DITROPAN ) 5 MG tablet Take 1 tablet (5 mg total) by mouth 3 (three) times daily for 7 days. 07/08/23 07/15/23 Yes Garrison Kanner, MD  oxyCODONE  (OXY IR/ROXICODONE ) 5 MG immediate release tablet Take 1 tablet (5 mg total) by mouth every 6 (six) hours as needed for severe pain (pain  score 7-10). 07/08/23  Yes Garrison Kanner, MD  pantoprazole  (PROTONIX ) 40 MG tablet Take 1 tablet (40 mg total) by mouth daily. 05/24/23 07/12/23 Yes Riddle, Suzann, NP  potassium chloride  SA (KLOR-CON  M) 20 MEQ tablet Take 2 tablets (40 mEq total) by mouth daily for 7 days. 07/08/23 07/15/23 Yes Garrison Kanner, MD  tamsulosin  (FLOMAX ) 0.4 MG CAPS capsule Take 1 capsule (0.4 mg total) by mouth daily for 7 days. 07/08/23 07/15/23 Yes Garrison Kanner, MD    Physical Exam: Vitals:   07/11/23 2130 07/11/23 2200 07/11/23 2330 07/12/23 0100  BP: 138/60 118/89 (!) 132/57 136/63  Pulse: 76 62 66 61  Resp:  18 18 18   Temp:      TempSrc:      SpO2: 97% 96% 94% 98%  Weight:      Height:       Physical Exam Vitals and nursing note reviewed.  Constitutional:      General: She is not in acute distress.    Comments: Patient in episodic discomfort during the encounter  HENT:     Head:  Normocephalic and atraumatic.  Cardiovascular:     Rate and Rhythm: Normal rate and regular rhythm.     Heart sounds: Normal heart sounds.  Pulmonary:     Effort: Pulmonary effort is normal.     Breath sounds: Normal breath sounds.  Abdominal:     Palpations: Abdomen is soft.     Tenderness: There is no abdominal tenderness. There is left CVA tenderness.  Neurological:     Mental Status: Mental status is at baseline.     Labs on Admission: I have personally reviewed following labs and imaging studies  CBC: Recent Labs  Lab 07/06/23 0326 07/07/23 0243 07/08/23 0121 07/11/23 2034  WBC 8.1 8.2 7.3 8.1  HGB 10.3* 9.6* 10.4* 10.8*  HCT 32.6* 31.5* 33.9* 35.0*  MCV 74.9* 77.8* 76.0* 77.1*  PLT 264 250 278 294   Basic Metabolic Panel: Recent Labs  Lab 07/06/23 0326 07/07/23 0243 07/08/23 0121 07/11/23 2034  NA 140 146* 141 141  K 3.1* 3.0* 3.2* 3.8  CL 108 110 109 109  CO2 25 25 25 26   GLUCOSE 125* 103* 125* 110*  BUN 13 13 7* 13  CREATININE 1.01* 0.89 0.64 0.81  CALCIUM  8.2* 8.1* 8.3* 8.9  MG 2.0  --   --   --    GFR: Estimated Creatinine Clearance: 67.4 mL/min (by C-G formula based on SCr of 0.81 mg/dL). Liver Function Tests: No results for input(s): "AST", "ALT", "ALKPHOS", "BILITOT", "PROT", "ALBUMIN" in the last 168 hours. No results for input(s): "LIPASE", "AMYLASE" in the last 168 hours. No results for input(s): "AMMONIA" in the last 168 hours. Coagulation Profile: No results for input(s): "INR", "PROTIME" in the last 168 hours. Cardiac Enzymes: No results for input(s): "CKTOTAL", "CKMB", "CKMBINDEX", "TROPONINI" in the last 168 hours. BNP (last 3 results) No results for input(s): "PROBNP" in the last 8760 hours. HbA1C: No results for input(s): "HGBA1C" in the last 72 hours. CBG: No results for input(s): "GLUCAP" in the last 168 hours. Lipid Profile: No results for input(s): "CHOL", "HDL", "LDLCALC", "TRIG", "CHOLHDL", "LDLDIRECT" in the last 72  hours. Thyroid  Function Tests: No results for input(s): "TSH", "T4TOTAL", "FREET4", "T3FREE", "THYROIDAB" in the last 72 hours. Anemia Panel: No results for input(s): "VITAMINB12", "FOLATE", "FERRITIN", "TIBC", "IRON", "RETICCTPCT" in the last 72 hours. Urine analysis:    Component Value Date/Time   COLORURINE YELLOW (A) 07/11/2023 2034   APPEARANCEUR HAZY (  A) 07/11/2023 2034   LABSPEC 1.013 07/11/2023 2034   PHURINE 7.0 07/11/2023 2034   GLUCOSEU NEGATIVE 07/11/2023 2034   HGBUR LARGE (A) 07/11/2023 2034   BILIRUBINUR NEGATIVE 07/11/2023 2034   KETONESUR NEGATIVE 07/11/2023 2034   PROTEINUR 100 (A) 07/11/2023 2034   NITRITE NEGATIVE 07/11/2023 2034   LEUKOCYTESUR LARGE (A) 07/11/2023 2034    Radiological Exams on Admission: CT Renal Stone Study Result Date: 07/11/2023 CLINICAL DATA:  Left flank pain, left lower quadrant abdominal pain, nephrolithiasis EXAM: CT ABDOMEN AND PELVIS WITHOUT CONTRAST TECHNIQUE: Multidetector CT imaging of the abdomen and pelvis was performed following the standard protocol without IV contrast. RADIATION DOSE REDUCTION: This exam was performed according to the departmental dose-optimization program which includes automated exposure control, adjustment of the mA and/or kV according to patient size and/or use of iterative reconstruction technique. COMPARISON:  07/04/2023 FINDINGS: Lower chest: No acute abnormality. Hepatobiliary: Cholelithiasis without superimposed pericholecystic inflammatory change. Liver unremarkable on this noncontrast examination save for focal fatty hepatic infiltration adjacent the falciform ligament. No intra or extrahepatic biliary ductal dilation. Pancreas: Unremarkable Spleen: Unremarkable Adrenals/Urinary Tract: The adrenal glands are unremarkable. The kidneys are normal in size position. Interval placement of a left double-J ureteral stent extending from the left renal pelvis to the bladder. 6 mm ureteral calculus is seen within the left  ureteropelvic junction adjacent to the ureteral stent. Previously noted left-sided hydronephrosis has resolved. 2 mm nonobstructing calculus within the lower pole the right kidney, unchanged. No hydronephrosis on right. No ureteral calculi right. The bladder is otherwise unremarkable. Stomach/Bowel: Stomach is within normal limits. Appendix appears normal. No evidence of bowel wall thickening, distention, or inflammatory changes. Vascular/Lymphatic: Aortic atherosclerosis. No enlarged abdominal or pelvic lymph nodes. Reproductive: Status post hysterectomy. No adnexal masses. Other: Tiny fat containing umbilical hernia. No abdominopelvic ascites. Musculoskeletal: No acute bone abnormality. Right total hip arthroplasty has been. Advanced degenerative changes are seen within the left hip. Vascular stent partially visualized within the proximal right SFA. IMPRESSION: 1. Interval placement of a left double-J ureteral stent. 6 mm calculus within the left ureteropelvic junction adjacent to the ureteral stent, unchanged in position. Interval resolution of previously noted left-sided hydronephrosis. 2. Minimal nonobstructing right nephrolithiasis. 3. Cholelithiasis. Aortic Atherosclerosis (ICD10-I70.0). Electronically Signed   By: Worthy Heads M.D.   On: 07/11/2023 21:39   Data Reviewed for HPI: Relevant notes from primary care and specialist visits, past discharge summaries as available in EHR, including Care Everywhere. Prior diagnostic testing as pertinent to current admission diagnoses Updated medications and problem lists for reconciliation ED course, including vitals, labs, imaging, treatment and response to treatment Triage notes, nursing and pharmacy notes and ED provider's notes Notable results as noted above in HPI      Assessment and Plan: * Renal colic on left side, intractable S/p left ureteral stent placement 07/05/2023 Complicated UTI Continue pain control with oxybutynin  and Flomax  Add  Anaspaz as trial Continue Rocephin  to replace home cefdinir until 07/18/2023(micro from 4/21 showed multiple species with recommendation for recollection) Additional pain control as needed Consider urology consult in the a.m (patient is being evaluated to see whether she is a candidate for lithotripsy  Chronic atrial flutter s/p ablation 05/24/23 (HCC) Continue apixaban   Coronary artery calcification seen on CT scan PAD s/p RLE angioplasty/thrombectomy/stent 06/20/23 Continue aspirin , atorvastatin  and losartan   Hypothyroidism, unspecified Continue levothyroxine   Tobacco use disorder Nicotine  patch  Essential hypertension Continue amlodipine , losartan      DVT prophylaxis: Apixaban   Consults: none  Advance Care  Planning:   Code Status: Prior   Family Communication: none  Disposition Plan: Back to previous home environment  Severity of Illness: The appropriate patient status for this patient is OBSERVATION. Observation status is judged to be reasonable and necessary in order to provide the required intensity of service to ensure the patient's safety. The patient's presenting symptoms, physical exam findings, and initial radiographic and laboratory data in the context of their medical condition is felt to place them at decreased risk for further clinical deterioration. Furthermore, it is anticipated that the patient will be medically stable for discharge from the hospital within 2 midnights of admission.   Author: Lanetta Pion, MD 07/12/2023 2:32 AM  For on call review [email or web address].

## 2023-07-12 NOTE — Assessment & Plan Note (Signed)
Continue amlodipine, losartan ?

## 2023-07-12 NOTE — ED Provider Notes (Signed)
-----------------------------------------   1:56 AM on 07/12/2023 -----------------------------------------   Pain somewhat better with lidocaine  but still having spikes of severe pain.  Given that patient has had multiple rounds of IV analgesia, will consult hospitalist services for evaluation and admission.   Norlene Beavers, MD 07/12/23 [phone]

## 2023-07-12 NOTE — Plan of Care (Signed)

## 2023-07-12 NOTE — Plan of Care (Signed)
 Patient was seen and examined at bedside, admitted last night due to left flank pain.  Currently patient is feeling better, received pain medication in the morning time and right now she has no pain but she is feeling very nauseous.  No any other complaints. Discussed with urology, stated that we will get her on the schedule next Tuesday for ureteroscopic stone removal  We will continue current treatment and follow along, possible discharge tomorrow a.m. if remains stable.

## 2023-07-12 NOTE — ED Notes (Signed)
 Received call from CCMD that pt's HR occasionally dropping into the 40's, ED physician aware.

## 2023-07-12 NOTE — Assessment & Plan Note (Signed)
 Continue levothyroxine

## 2023-07-12 NOTE — Assessment & Plan Note (Signed)
 Nicotine  patch

## 2023-07-12 NOTE — Assessment & Plan Note (Signed)
 Continue apixaban

## 2023-07-12 NOTE — TOC CM/SW Note (Signed)
 Transition of Care [facility]) - Inpatient Brief Assessment   Patient Details  Name: [name] MRN: [id number] Date of Birth: January 30, 1951  Transition of Care [facility]) CM/SW Contact:    Loman Risk, RN Phone Number: 07/12/2023, 10:37 AM   Clinical Narrative:   Transition of Care [facility]) Screening Note   Patient Details  Name: [name] Date of Birth: [birth year]   Transition of Care [facility]) CM/SW Contact:    Loman Risk, RN Phone Number: 07/12/2023, 10:37 AM    Transition of Care Department [facility]) has reviewed patient and no TOC needs have been identified at this time. . If new patient transition needs arise, please place a TOC consult.    Transition of Care Asessment: Insurance and Status: Insurance coverage has been reviewed Patient has primary care physician: Yes     Prior/Current Home Services: No current home services Social Drivers of Health Review: SDOH reviewed no interventions necessary Readmission risk has been reviewed: Yes Transition of care needs: no transition of care needs at this time

## 2023-07-13 DIAGNOSIS — N23 Unspecified renal colic: Secondary | ICD-10-CM | POA: Diagnosis not present

## 2023-07-13 LAB — CBC
HCT: 36.5 % (ref 36.0–46.0)
Hemoglobin: 11.1 g/dL — ABNORMAL LOW (ref 12.0–15.0)
MCH: 23.4 pg — ABNORMAL LOW (ref 26.0–34.0)
MCHC: 30.4 g/dL (ref 30.0–36.0)
MCV: 77 fL — ABNORMAL LOW (ref 80.0–100.0)
Platelets: 303 10*3/uL (ref 150–400)
RBC: 4.74 MIL/uL (ref 3.87–5.11)
RDW: 14.8 % (ref 11.5–15.5)
WBC: 8.5 10*3/uL (ref 4.0–10.5)
nRBC: 0 % (ref 0.0–0.2)

## 2023-07-13 LAB — BASIC METABOLIC PANEL WITH GFR
Anion gap: 10 (ref 5–15)
BUN: 12 mg/dL (ref 8–23)
CO2: 25 mmol/L (ref 22–32)
Calcium: 9 mg/dL (ref 8.9–10.3)
Chloride: 101 mmol/L (ref 98–111)
Creatinine, Ser: 0.65 mg/dL (ref 0.44–1.00)
GFR, Estimated: >60 mL/min (ref >60)
Glucose, Bld: 98 mg/dL (ref 70–99)
Potassium: 3.5 mmol/L (ref 3.5–5.1)
Sodium: 136 mmol/L (ref 135–145)

## 2023-07-13 LAB — URINE CULTURE

## 2023-07-13 LAB — PHOSPHORUS: Phosphorus: 2.9 mg/dL (ref 2.5–4.6)

## 2023-07-13 LAB — MAGNESIUM: Magnesium: 2 mg/dL (ref 1.7–2.4)

## 2023-07-13 MED ORDER — POTASSIUM CHLORIDE CRYS ER 20 MEQ PO TBCR
40.0000 meq | EXTENDED_RELEASE_TABLET | Freq: Once | ORAL | Status: AC
  Administered 2023-07-13: 40 meq via ORAL
  Filled 2023-07-13: qty 2

## 2023-07-13 MED ORDER — DICLOFENAC SODIUM 1 % EX GEL
2.0000 g | Freq: Four times a day (QID) | CUTANEOUS | Status: DC
  Administered 2023-07-13 – 2023-07-14 (×3): 2 g via TOPICAL
  Filled 2023-07-13: qty 100

## 2023-07-13 NOTE — Plan of Care (Signed)
  Problem: Education: Goal: Knowledge of General Education information will improve Description: Including pain rating scale, medication(s)/side effects and non-pharmacologic comfort measures Outcome: Progressing   Problem: Nutrition: Goal: Adequate nutrition will be maintained Outcome: Progressing   Problem: Coping: Goal: Level of anxiety will decrease Outcome: Progressing   Problem: Elimination: Goal: Will not experience complications related to bowel motility Outcome: Progressing Goal: Will not experience complications related to urinary retention Outcome: Progressing   Problem: Pain Managment: Goal: General experience of comfort will improve and/or be controlled Outcome: Progressing   Problem: Safety: Goal: Ability to remain free from injury will improve Outcome: Progressing   Problem: Skin Integrity: Goal: Risk for impaired skin integrity will decrease Outcome: Progressing

## 2023-07-13 NOTE — Progress Notes (Signed)
 [facility] Progress Note  Patient: [name]    BJY:[id number]  DOA: 07/11/2023     Date of Service: the patient was seen and examined on 07/13/2023  Chief Complaint  Patient presents with   Flank Pain    Pt brought in by EMS for left sided flank, LLQ pain (10/10).  Pt was seen last week for a kidney stone and had a stent placed.  Symptoms resolved, but came back today.  Pt reporting nausea and hematuria.     Brief hospital course: [name] is a 73 y.o. female with medical history significant for chronic A-flutter status post ablation 05/24/23, on Eliquis , PAD status post stenting, HTN, CAD, tobacco use disorder, recently hospitalized from 4/21 to 07/08/2023 with obstructive left ureteral stone with hydronephrosis undergoing stent placement on 07/05/2023, discharged with Flomax  and oxybutynin  due to persistent left-sided flank pain with plans for lithotripsy, who is being admitted for continued ongoing intractable left flank pain post-stent unrelieved with prescribed medications from her recent stay. She describes the pain as sharp and colicky/ spasm-like of severe intensity. She has associated nausea . Of note patient was also discharged on cefdinir for possible complicated UTI and Pyridium for dysuria.  Presently she denies fever or chills,diarrhea ED course and data review: BP 156/90 with otherwise normal vitals Urinalysis showing large hemoglobin, large leuks CBC unremarkable with WBC 8and baseline hemoglobin of 10.8 BMP unremarkable   CT renal stone study showing resolution of previously noted left-sided hydronephrosis, 6 mm calculus at left UPJ adjacent to the ureteral stent unchanged in position.  Among other findings please see report   Patient treated with morphine , Toradol , lidocaine  but continued to have intractable pain She was started on ceftriaxone  Hospitalist consulted for admission.    Assessment and Plan:  Renal colic on left side, intractable S/p left  ureteral stent placement 07/05/2023 Complicated UTI Continue pain control with oxybutynin  and Flomax  Add Anaspaz as trial Continue Rocephin  to replace home cefdinir until 07/18/2023(micro from 4/21 showed multiple species with recommendation for recollection) Additional pain control as needed D/w urology, recommended no intervention and follow-up as an outpatient on next Thursday, and recommended to change oxybutynin  to extended release 10 mg p.o. daily 4/30 severe pain last night and feeling nauseous, still has pain in the left flank and lower back area.  Localized tenderness, most likely muscular skeletal tenderness 4/30 Started Voltaren gel for localized pain       Chronic atrial flutter s/p ablation 05/24/23  Continue apixaban    Coronary artery calcification seen on CT scan PAD s/p RLE angioplasty/thrombectomy/stent 06/20/23 Continue aspirin , atorvastatin  and losartan    Hypothyroidism, unspecified Continue levothyroxine    Tobacco use disorder Nicotine  patch   Essential hypertension Continue amlodipine , losartan   Vitamin D  Insufficiency: started vitamin D  50,000 units p.o. weekly, follow with PCP to repeat vitamin D  level after 3 to 6 months.  Vitamin B12 level 334, goal >400, started oral supplement.  Follow with PCP to repeat B12 level after 3 to 6 months    Body mass index is 29.29 kg/m.  Interventions:  Diet: Heart healthy DVT Prophylaxis: Therapeutic Anticoagulation with Eliquis     Advance goals of care discussion: Full code  Family Communication: family was not present at bedside, at the time of interview.  The pt provided permission to discuss medical plan with the family. Opportunity was given to ask question and all questions were answered satisfactorily.   Disposition:  Pt is from home, admitted with intractable left flank pain due to  ureteral stone, still has intractable pain, which precludes a safe discharge. Discharge to home, when stable, most likely  tomorrow a.m.  Subjective: Overnight patient had significant pain, received pain medication and in the morning time she was nauseous.  BP was elevated.  Patient was pointing pain and tenderness in the left flank and back side.  Localized tenderness, most likely musculoskeletal pain Patient does not feel comfortable going home today, we will plan to discharge tomorrow a.m.  Physical Exam: General: NAD, lying comfortably Appear in no distress, affect appropriate Eyes: PERRLA ENT: Oral Mucosa Clear, moist  Neck: no JVD,  Cardiovascular: S1 and S2 Present, no Murmur,  Respiratory: good respiratory effort, Bilateral Air entry equal and Decreased, no Crackles, no wheezes Abdomen: Bowel Sound present, Soft and no tenderness,  Skin: no rashes Extremities: no Pedal edema, no calf tenderness Neurologic: without any new focal findings Gait not checked due to patient safety concerns  Vitals:   07/13/23 0340 07/13/23 0832 07/13/23 1422 07/13/23 1510  BP: (!) 141/59 (!) 155/57 (!) 151/77 (!) 143/58  Pulse: (!) 56 66 (!) 53 (!) 48  Resp: 18 19  19   Temp: 97.9 F (36.6 C) 98 F (36.7 C) 98.5 F (36.9 C) 97.9 F (36.6 C)  TempSrc: Oral Oral Oral Oral  SpO2: 95% 98% 100% 100%  Weight:      Height:        Intake/Output Summary (Last 24 hours) at 07/13/2023 1609 Last data filed at 07/13/2023 0340 Gross per 24 hour  Intake 100 ml  Output --  Net 100 ml   Filed Weights   07/11/23 2022  Weight: 79.8 kg    Data Reviewed: I have personally reviewed and interpreted daily labs, tele strips, imagings as discussed above. I reviewed all nursing notes, pharmacy notes, vitals, pertinent old records I have discussed plan of care as described above with RN and patient/family.  CBC: Recent Labs  Lab 07/07/23 0243 07/08/23 0121 07/11/23 2034 07/13/23 0445  WBC 8.2 7.3 8.1 8.5  HGB 9.6* 10.4* 10.8* 11.1*  HCT 31.5* 33.9* 35.0* 36.5  MCV 77.8* 76.0* 77.1* 77.0*  PLT 250 278 294 303   Basic  Metabolic Panel: Recent Labs  Lab 07/07/23 0243 07/08/23 0121 07/11/23 2034 07/13/23 0445  NA 146* 141 141 136  K 3.0* 3.2* 3.8 3.5  CL 110 109 109 101  CO2 25 25 26 25   GLUCOSE 103* 125* 110* 98  BUN 13 7* 13 12  CREATININE 0.89 0.64 0.81 0.65  CALCIUM  8.1* 8.3* 8.9 9.0  MG  --   --   --  2.0  PHOS  --   --   --  2.9    Studies: No results found.  Scheduled Meds:  apixaban   5 mg Oral BID   aspirin  EC  81 mg Oral Daily   atorvastatin   10 mg Oral Daily   vitamin B-12  1,000 mcg Oral Daily   fluticasone  furoate-vilanterol  1 puff Inhalation Daily   levothyroxine   100 mcg Oral Q0600   losartan   100 mg Oral Daily   nicotine   14 mg Transdermal Daily   oxybutynin   10 mg Oral QHS   pantoprazole   40 mg Oral Daily   tamsulosin   0.4 mg Oral Daily   Vitamin D  (Ergocalciferol )  50,000 Units Oral Q7 days   Continuous Infusions:  cefTRIAXone  (ROCEPHIN )  IV 200 mL/hr at 07/13/23 0340   PRN Meds: acetaminophen  **OR** acetaminophen , albuterol , HYDROmorphone  (DILAUDID ) injection, ketorolac , ondansetron  **OR** ondansetron  (ZOFRAN )  IV, oxyCODONE   Time spent: 35 minutes  Author: Althia Atlas. MD [facility] 07/13/2023 4:09 PM  To reach On-call, see care teams to locate the attending and reach out to them via [email or web address]. If 7PM-7AM, please contact night-coverage If you still have difficulty reaching the attending provider, please page the [facility] (Director on Call) for [facility] on amion for assistance.

## 2023-07-14 ENCOUNTER — Ambulatory Visit: Admitting: Urology

## 2023-07-14 ENCOUNTER — Other Ambulatory Visit: Payer: Self-pay | Admitting: Urology

## 2023-07-14 DIAGNOSIS — N200 Calculus of kidney: Secondary | ICD-10-CM

## 2023-07-14 DIAGNOSIS — N23 Unspecified renal colic: Secondary | ICD-10-CM | POA: Diagnosis not present

## 2023-07-14 LAB — CBC
HCT: 34.2 % — ABNORMAL LOW (ref 36.0–46.0)
Hemoglobin: 10.6 g/dL — ABNORMAL LOW (ref 12.0–15.0)
MCH: 23.3 pg — ABNORMAL LOW (ref 26.0–34.0)
MCHC: 31 g/dL (ref 30.0–36.0)
MCV: 75.2 fL — ABNORMAL LOW (ref 80.0–100.0)
Platelets: 275 10*3/uL (ref 150–400)
RBC: 4.55 MIL/uL (ref 3.87–5.11)
RDW: 14.8 % (ref 11.5–15.5)
WBC: 6.2 10*3/uL (ref 4.0–10.5)
nRBC: 0 % (ref 0.0–0.2)

## 2023-07-14 LAB — BASIC METABOLIC PANEL WITH GFR
Anion gap: 6 (ref 5–15)
BUN: 13 mg/dL (ref 8–23)
CO2: 24 mmol/L (ref 22–32)
Calcium: 8.4 mg/dL — ABNORMAL LOW (ref 8.9–10.3)
Chloride: 110 mmol/L (ref 98–111)
Creatinine, Ser: 0.77 mg/dL (ref 0.44–1.00)
GFR, Estimated: >60 mL/min (ref >60)
Glucose, Bld: 117 mg/dL — ABNORMAL HIGH (ref 70–99)
Potassium: 3.9 mmol/L (ref 3.5–5.1)
Sodium: 140 mmol/L (ref 135–145)

## 2023-07-14 LAB — PHOSPHORUS: Phosphorus: 3.5 mg/dL (ref 2.5–4.6)

## 2023-07-14 LAB — MAGNESIUM: Magnesium: 2.3 mg/dL (ref 1.7–2.4)

## 2023-07-14 MED ORDER — OXYBUTYNIN CHLORIDE ER 10 MG PO TB24: 10.0000 mg | ORAL_TABLET | Freq: Every day | ORAL | 1 refills | Status: DC

## 2023-07-14 NOTE — Care Management Important Message (Signed)
 Important Message  Patient Details  Name: [name] MRN: [id number] Date of Birth: 02-07-1951   Important Message Given:  Yes - Medicare IM     Anise Kerns 07/14/2023, 12:05 PM

## 2023-07-14 NOTE — Discharge Summary (Signed)
 [name]:[id number] DOB: 1951/02/26 DOA: 07/11/2023  PCP[facility]  Admit date: 07/11/2023 Discharge date: 07/14/2023  Time spent: 35 minutes  Recommendations for Outpatient Follow-up:  Urology f/u next week as scheduled     Discharge Diagnoses:  Principal Problem:   Renal colic on left side, intractable Active Problems:   s/p left Ureteral stent placement 07/05/23   Chronic atrial flutter s/p ablation 05/24/23 (HCC)   Complicated UTI (urinary tract infection)   Coronary artery calcification seen on CT scan   PAD (peripheral artery disease) (HCC)   Essential hypertension   Tobacco use disorder   Hypothyroidism, unspecified   Discharge Condition: improved  Diet recommendation: heart healthy  Filed Weights   07/11/23 2022  Weight: 79.8 kg    History of present illness:  From admission h and p  [name] is a 73 y.o. female with medical history significant for chronic A-flutter status post ablation 05/24/23, on Eliquis , PAD status post stenting, HTN, CAD, tobacco use disorder, recently hospitalized from 4/21 to 07/08/2023 with obstructive left ureteral stone with hydronephrosis undergoing stent placement on 07/05/2023, discharged with Flomax  and oxybutynin  due to persistent left-sided flank pain with plans for lithotripsy, who is being admitted for continued ongoing intractable left flank pain post-stent unrelieved with prescribed medications from her recent stay. She describes the pain as sharp and colicky/ spasm-like of severe intensity. She has associated nausea . Of note patient was also discharged on cefdinir  for possible complicated UTI and Pyridium for dysuria.  Presently she denies fever or chills,diarrhea ED course and data review: BP 156/90 with otherwise normal vitals Urinalysis showing large hemoglobin, large leuks CBC unremarkable with WBC 8and baseline hemoglobin of 10.8 BMP unremarkable   CT renal stone study showing resolution of  previously noted left-sided hydronephrosis, 6 mm calculus at left UPJ adjacent to the ureteral stent unchanged in position.  Among other findings please see report   Patient treated with morphine , Toradol , lidocaine  but continued to have intractable pain She was started on ceftriaxone  Hospitalist consulted for admission.   Hospital Course:  Patient presents with renal colic from recently placed ureteral stent for kidney stone. CT scan here showed appropriately placed stent. Urine culture grew multiple species, no signs sepsis. Prior hospitalist discussed case with Dr. Cherylene Corrente who advised pain control and outpatient follow-up. We have increased her oxybutynin , continued oxycodone  and flomax . Symptoms currently much improved. Will discharge home to follow up with urology next week as scheduled. Finish outpatient antibiotics (cefdinir ). BPs elevated here but home amlodipine  held, advise resuming all antihypertensives at discharge. Other chronic medical problems stable.   Procedures: none   Consultations: none  Discharge Exam: Vitals:   07/14/23 0459 07/14/23 0848  BP: (!) 152/60 (!) 172/97  Pulse: (!) 53 (!) 54  Resp: 16 18  Temp: 97.7 F (36.5 C) 97.9 F (36.6 C)  SpO2: 100% 100%    General: NAD Cardiovascular: RRR Respiratory: CTAB   Discharge Instructions   Discharge Instructions     Diet - low sodium heart healthy   Complete by: As directed    Increase activity slowly   Complete by: As directed       Allergies as of 07/14/2023       Reactions   Lisinopril  Swelling   Swelling of lips and face.    Tizanidine  Other (See Comments)   bradycardia   Amoxicillin  Itching, Rash   Latex Itching, Rash        Medication List  STOP taking these medications    oxybutynin  5 MG tablet Commonly known as: DITROPAN  Replaced by: oxybutynin  10 MG 24 hr tablet       TAKE these medications    acetaminophen  650 MG CR tablet Commonly known as: TYLENOL  Take 650-1,300  mg by mouth every 8 (eight) hours as needed for pain.   albuterol  108 (90 Base) MCG/ACT inhaler Commonly known as: VENTOLIN  HFA Inhale 1-2 puffs into the lungs every 4 (four) hours as needed for wheezing or shortness of breath. Inhale 2-4 puffs by mouth every 4 hours as needed for wheezing, cough, and/or shortness of breath   amLODipine  10 MG tablet Commonly known as: NORVASC  Take 1 tablet (10 mg total) by mouth daily.   apixaban  5 MG Tabs tablet Commonly known as: ELIQUIS  Take 1 tablet (5 mg total) by mouth 2 (two) times daily.   ascorbic acid 500 MG tablet Commonly known as: VITAMIN C Take 500 mg by mouth daily.   aspirin  EC 81 MG tablet Take 1 tablet (81 mg total) by mouth daily. Swallow whole.   atorvastatin  10 MG tablet Commonly known as: Lipitor Take 1 tablet (10 mg total) by mouth daily.   benzonatate  100 MG capsule Commonly known as: Tessalon  Perles Take 1-2 tabs TID prn cough   cefdinir  300 MG capsule Commonly known as: OMNICEF  Take 1 capsule (300 mg total) by mouth 2 (two) times daily for 5 days.   cholecalciferol  1000 units tablet Commonly known as: VITAMIN D  Take 1,000 Units by mouth daily.   fluticasone  furoate-vilanterol 100-25 MCG/ACT Aepb Commonly known as: Breo Ellipta  Inhale 1 puff into the lungs daily.   levothyroxine  100 MCG tablet Commonly known as: SYNTHROID  Take 100 mcg by mouth daily before breakfast.   lidocaine  5 % ointment Commonly known as: XYLOCAINE  Apply 1 Application topically as needed.   losartan  100 MG tablet Commonly known as: COZAAR  Take 1 tablet (100 mg total) by mouth daily.   oxybutynin  10 MG 24 hr tablet Commonly known as: DITROPAN -XL Take 1 tablet (10 mg total) by mouth at bedtime. Replaces: oxybutynin  5 MG tablet   oxyCODONE  5 MG immediate release tablet Commonly known as: Oxy IR/ROXICODONE  Take 1 tablet (5 mg total) by mouth every 6 (six) hours as needed for severe pain (pain score 7-10).   pantoprazole  40 MG  tablet Commonly known as: Protonix  Take 1 tablet (40 mg total) by mouth daily.   potassium chloride  SA 20 MEQ tablet Commonly known as: KLOR-CON  M Take 2 tablets (40 mEq total) by mouth daily for 7 days.   tamsulosin  0.4 MG Caps capsule Commonly known as: FLOMAX  Take 1 capsule (0.4 mg total) by mouth daily for 7 days.       Allergies  Allergen Reactions   Lisinopril  Swelling    Swelling of lips and face.    Tizanidine  Other (See Comments)    bradycardia   Amoxicillin  Itching and Rash   Latex Itching and Rash    Follow-up Information     Stoioff, Kizzie Perks, MD Follow up.   Specialty: Urology Why: next week as scheduled Contact information: [address] [place] [address] [phone]                  The results of significant diagnostics from this hospitalization (including imaging, microbiology, ancillary and laboratory) are listed below for reference.    Significant Diagnostic Studies: CT Renal Stone Study Result Date: 07/11/2023 CLINICAL DATA:  Left flank pain, left lower  quadrant abdominal pain, nephrolithiasis EXAM: CT ABDOMEN AND PELVIS WITHOUT CONTRAST TECHNIQUE: Multidetector CT imaging of the abdomen and pelvis was performed following the standard protocol without IV contrast. RADIATION DOSE REDUCTION: This exam was performed according to the departmental dose-optimization program which includes automated exposure control, adjustment of the mA and/or kV according to patient size and/or use of iterative reconstruction technique. COMPARISON:  07/04/2023 FINDINGS: Lower chest: No acute abnormality. Hepatobiliary: Cholelithiasis without superimposed pericholecystic inflammatory change. Liver unremarkable on this noncontrast examination save for focal fatty hepatic infiltration adjacent the falciform ligament. No intra or extrahepatic biliary ductal dilation. Pancreas: Unremarkable Spleen: Unremarkable Adrenals/Urinary Tract: The adrenal glands  are unremarkable. The kidneys are normal in size position. Interval placement of a left double-J ureteral stent extending from the left renal pelvis to the bladder. 6 mm ureteral calculus is seen within the left ureteropelvic junction adjacent to the ureteral stent. Previously noted left-sided hydronephrosis has resolved. 2 mm nonobstructing calculus within the lower pole the right kidney, unchanged. No hydronephrosis on right. No ureteral calculi right. The bladder is otherwise unremarkable. Stomach/Bowel: Stomach is within normal limits. Appendix appears normal. No evidence of bowel wall thickening, distention, or inflammatory changes. Vascular/Lymphatic: Aortic atherosclerosis. No enlarged abdominal or pelvic lymph nodes. Reproductive: Status post hysterectomy. No adnexal masses. Other: Tiny fat containing umbilical hernia. No abdominopelvic ascites. Musculoskeletal: No acute bone abnormality. Right total hip arthroplasty has been. Advanced degenerative changes are seen within the left hip. Vascular stent partially visualized within the proximal right SFA. IMPRESSION: 1. Interval placement of a left double-J ureteral stent. 6 mm calculus within the left ureteropelvic junction adjacent to the ureteral stent, unchanged in position. Interval resolution of previously noted left-sided hydronephrosis. 2. Minimal nonobstructing right nephrolithiasis. 3. Cholelithiasis. Aortic Atherosclerosis (ICD10-I70.0). Electronically Signed   By: Worthy Heads M.D.   On: 07/11/2023 21:39   DG OR UROLOGY CYSTO IMAGE ([facility] ONLY) Result Date: 07/05/2023 There is no interpretation for this exam.  This order is for images obtained during a surgical procedure.  Please See "Surgeries" Tab for more information regarding the procedure.   CT Renal Stone Study Result Date: 07/04/2023 CLINICAL DATA:  Abdominal pain/flank pain EXAM: CT ABDOMEN AND PELVIS WITHOUT CONTRAST TECHNIQUE: Multidetector CT imaging of the abdomen and pelvis was  performed following the standard protocol without IV contrast. RADIATION DOSE REDUCTION: This exam was performed according to the departmental dose-optimization program which includes automated exposure control, adjustment of the mA and/or kV according to patient size and/or use of iterative reconstruction technique. COMPARISON:  Abdominal sonogram 09/11/2020 FINDINGS: Lower chest: Scarring noted within the lung bases. No pleural fluid or consolidative change. Cardiac enlargement. Coronary artery and aortic atherosclerotic calcifications. Hepatobiliary: No suspicious liver abnormality. Focal fatty deposition noted about the inferior aspect of the falciform ligament. Gallstones are identified. No gallbladder wall thickening or pericholecystic inflammation. No bile duct dilatation. Pancreas: Unremarkable. No pancreatic ductal dilatation or surrounding inflammatory changes. Spleen: Normal in size without focal abnormality. Adrenals/Urinary Tract: Normal adrenal glands. 2 stones identified within the right renal collecting system. These measure up to 2 mm. No right-sided obstructive uropathy. Upper pole right kidney cyst measures 1.1 cm. No follow-up imaging recommended. Left-sided hydronephrosis with perinephric soft tissue stranding. At the right UPJ there is a stone measuring 9 mm, image 38/2 and coronal image 52/5. The left ureter is decompressed beyond the UPJ. Exam detail within the pelvis is diminished due to streak artifact from right hip arthroplasty device. Within this limitation, no focal bladder abnormality noted.  Stomach/Bowel: Stomach appears normal. The appendix is visualized and is normal. No bowel wall thickening, inflammation or distension. Vascular/Lymphatic: Aortic atherosclerosis. No aneurysm. No abdominopelvic adenopathy. Reproductive: Status post hysterectomy. No adnexal masses. Other: No free fluid or fluid collections. Small fat containing umbilical hernia. No free fluid or fluid collections. No  signs of pneumoperitoneum. Musculoskeletal: Status post right hip arthroplasty. Severe degenerative changes noted in the left hip. No acute abnormality. IMPRESSION: 1. Left-sided hydronephrosis with perinephric soft tissue stranding. There is a 9 mm stone at the left UPJ. 2. Nonobstructing right renal calculi. 3. Cholelithiasis. 4. Severe degenerative changes in the left hip. 5.  Aortic Atherosclerosis (ICD10-I70.0). Electronically Signed   By: Kimberley Penman M.D.   On: 07/04/2023 05:48   DG Chest 2 View Result Date: 06/23/2023 CLINICAL DATA:  Dry cough. EXAM: CHEST - 2 VIEW COMPARISON:  June 07, 2023 FINDINGS: The cardiac silhouette is mildly enlarged and unchanged in size. There is moderate severity calcification of the aortic arch. Mild, stable linear scarring and/or atelectasis is seen within the left lung base. There is no evidence of focal consolidation, pleural effusion or pneumothorax. The visualized skeletal structures are unremarkable. IMPRESSION: Stable cardiomegaly with mild, stable left basilar linear scarring and/or atelectasis. Electronically Signed   By: Virgle Grime M.D.   On: 06/23/2023 04:33   PERIPHERAL VASCULAR CATHETERIZATION Result Date: 06/20/2023 See surgical note for result.   Microbiology: Recent Results (from the past 240 hours)  Urine Culture     Status: None   Collection Time: 07/05/23  1:30 PM   Specimen: Urine, Cystoscope  Result Value Ref Range Status   Specimen Description   Final    CYSTOSCOPY Performed at [facility], [address] [place], [place] [address]    Special Requests   Final    urine culture Performed at [facility], [address], [place], [place] [address]    Culture   Final    NO GROWTH Performed at [facility], [address] [place], [place] [address]    Report Status 07/06/2023 FINAL  Final  Urine Culture     Status: Abnormal   Collection Time: 07/11/23  8:34 PM   Specimen: Urine, Clean Catch   Result Value Ref Range Status   Specimen Description   Final    URINE, CLEAN CATCH Performed at [facility], [address] [place], [place] [address]    Special Requests   Final    NONE Performed at [facility], [address] [place], [place] [address]    Culture MULTIPLE SPECIES PRESENT, SUGGEST RECOLLECTION (A)  Final   Report Status 07/13/2023 FINAL  Final     Labs: Basic Metabolic Panel: Recent Labs  Lab 07/08/23 0121 07/11/23 2034 07/13/23 0445 07/14/23 0432  NA 141 141 136 140  K 3.2* 3.8 3.5 3.9  CL 109 109 101 110  CO2 25 26 25 24   GLUCOSE 125* 110* 98 117*  BUN 7* 13 12 13   CREATININE 0.64 0.81 0.65 0.77  CALCIUM  8.3* 8.9 9.0 8.4*  MG  --   --  2.0 2.3  PHOS  --   --  2.9 3.5   Liver Function Tests: No results for input(s): "AST", "ALT", "ALKPHOS", "BILITOT", "PROT", "ALBUMIN" in the last 168 hours. No results for input(s): "LIPASE", "AMYLASE" in the last 168 hours. No results for input(s): "AMMONIA" in the last 168 hours. CBC: Recent Labs  Lab 07/08/23 0121 07/11/23 2034 07/13/23 0445 07/14/23 2595  WBC 7.3 8.1 8.5 6.2  HGB 10.4* 10.8* 11.1* 10.6*  HCT 33.9* 35.0* 36.5 34.2*  MCV 76.0* 77.1* 77.0* 75.2*  PLT 278 294 303 275   Cardiac Enzymes: No results for input(s): "CKTOTAL", "CKMB", "CKMBINDEX", "TROPONINI" in the last 168 hours. BNP: BNP (last 3 results) Recent Labs    06/07/23 0602 06/12/23 0010  BNP 146.3* 177.4*    ProBNP (last 3 results) No results for input(s): "PROBNP" in the last 8760 hours.  CBG: No results for input(s): "GLUCAP" in the last 168 hours.     Signed:  Raymonde Calico MD.  [facility] 07/14/2023, 12:04 PM

## 2023-07-15 ENCOUNTER — Encounter (INDEPENDENT_AMBULATORY_CARE_PROVIDER_SITE_OTHER)

## 2023-07-15 ENCOUNTER — Other Ambulatory Visit: Payer: Self-pay

## 2023-07-15 ENCOUNTER — Telehealth: Payer: Self-pay

## 2023-07-15 ENCOUNTER — Ambulatory Visit (INDEPENDENT_AMBULATORY_CARE_PROVIDER_SITE_OTHER): Admitting: Nurse Practitioner

## 2023-07-15 DIAGNOSIS — N200 Calculus of kidney: Secondary | ICD-10-CM

## 2023-07-15 NOTE — Progress Notes (Signed)
 Surgical Physician Order Form [facility]  Dr. Darlynn Elam, MD  * Scheduling expectation : Tuesday, 07/19/2023  *Length of Case: 60 minutes  *Clearance needed: no  *Anticoagulation Instructions: Okay to continue Eliquis   *Aspirin  Instructions: N/A  *Post-op visit Date/Instructions:  1 week cysto stent removal  *Diagnosis: Left Nephrolithiasis  *Procedure: left  Ureteroscopy w/laser lithotripsy & stent exchange ([address])   Additional orders: N/A  -Admit type: OUTpatient  -Anesthesia: General  -VTE Prophylaxis Standing Order SCD's       Other:   -Standing Lab Orders Per Anesthesia    Lab other: None  -Standing Test orders EKG/Chest x-ray per Anesthesia       Test other:   - Medications:  Gentamicin per pharmacy  -Other orders:  N/A

## 2023-07-15 NOTE — Progress Notes (Signed)
   [facility] Surgical Posting Form  Surgery Date: Date: 07/19/2023  Surgeon: Dr. Darlynn Elam, MD  Inpt ( No  )   Outpt (Yes)   Obs ( Yes  )   Diagnosis: N20.0 Left Nephrolithiasis  -CPT: [phone]  Surgery: Left Ureteroscopy with Laser Lithotripsy and Stent Exchange  Stop Anticoagulations: No, may continue Eliquis   Cardiac/Medical/Pulmonary Clearance needed: no  *Orders entered into EPIC  Date: 07/15/23   *Case booked in [place]  Date: 07/15/23  *Notified pt of Surgery: Date: 07/15/23  PRE-OP UA & CX: no  *Placed into Prior Authorization Work Que Date: 07/15/23  Assistant/laser/rep:No

## 2023-07-15 NOTE — Telephone Encounter (Signed)
 Per Dr. Cherylene Corrente, Patient is to be scheduled for  Left Ureteroscopy with Laser Lithotripsy and Stent Exchange   [name] was contacted and possible surgical dates were discussed, Tuesday May 6th, 2025 was agreed upon for surgery.   Patient was directed to call [phone] between 1-3pm the day before surgery to find out surgical arrival time.  Instructions were given not to eat or drink from midnight on the night before surgery and have a driver for the day of surgery. On the surgery day patient was instructed to enter through the Medical Mall entrance of [facility] report the Same Day Surgery desk.   Pre-Admit Testing will be in contact via phone to set up an interview with the anesthesia team to review your history and medications prior to surgery.   Reminder of this information was sent via MyChart to the patient.

## 2023-07-18 ENCOUNTER — Other Ambulatory Visit: Payer: Self-pay

## 2023-07-18 ENCOUNTER — Encounter
Admission: RE | Admit: 2023-07-18 | Discharge: 2023-07-18 | Disposition: A | Discharge status: Home / Self Care | Source: Ambulatory Visit | Attending: Urology | Admitting: Urology

## 2023-07-18 HISTORY — DX: Personal history of urinary calculi: Z87.442

## 2023-07-18 HISTORY — DX: Typical atrial flutter: I48.3

## 2023-07-18 HISTORY — DX: Other thrombophilia: D68.69

## 2023-07-18 HISTORY — DX: Paroxysmal atrial fibrillation: I48.0

## 2023-07-18 MED ORDER — CHLORHEXIDINE GLUCONATE 0.12 % MT SOLN
15.0000 mL | Freq: Once | OROMUCOSAL | Status: AC
  Administered 2023-07-19: 15 mL via OROMUCOSAL

## 2023-07-18 MED ORDER — DEXTROSE 5 % IV SOLN
5.0000 mg/kg | INTRAVENOUS | Status: AC
  Administered 2023-07-19: 400 mg via INTRAVENOUS
  Filled 2023-07-18: qty 10

## 2023-07-18 MED ORDER — ORAL CARE MOUTH RINSE: 15.0000 mL | Freq: Once | OROMUCOSAL | Status: AC

## 2023-07-18 MED ORDER — PHENYLEPHRINE HCL-NACL 20-0.9 MG/250ML-% IV SOLN
INTRAVENOUS | Status: AC
  Filled 2023-07-18: qty 250

## 2023-07-18 MED ORDER — LACTATED RINGERS IV SOLN: INTRAVENOUS | Status: DC

## 2023-07-18 NOTE — Patient Instructions (Addendum)
 Your procedure is scheduled ZO:[id number] Jul 19, 2023 Report to the Registration Desk on the 1st floor of the [facility] To find out your arrival time, please call [phone] between 1PM - 3PM on: Monday 07/18/23 If your arrival time is 6:00 am, do not arrive before that time as the Medical Mall entrance doors do not open until 6:00 am.  REMEMBER: Instructions that are not followed completely may result in serious medical risk, up to and including death; or upon the discretion of your surgeon and anesthesiologist your surgery may need to be rescheduled.  Do not eat food or drink fluids after midnight the night before surgery.  No gum chewing or hard candies.   One week prior to surgery: Stop Anti-inflammatories (NSAIDS) such as Advil , Aleve , Ibuprofen , Motrin , Naproxen , Naprosyn  and Aspirin  based products such as Excedrin, Goody's Powder, BC Powder. Stop ANY OVER THE COUNTER supplements until after surgery.  You may however, continue to take Tylenol  if needed for pain up until the day of surgery.   Continue taking all of your other prescription medications up until the day of surgery.  ON THE DAY OF SURGERY ONLY TAKE THESE MEDICATIONS WITH SIPS OF WATER :  amLODipine  (NORVASC ) 10 MG  levothyroxine  (SYNTHROID ) 100 MCG  pantoprazole  (PROTONIX ) 40 MG   Use inhalers on the day of surgery and bring to the hospital. albuterol  (VENTOLIN  HFA) 108 (90 Base) MCG/ACT inhaler  fluticasone  furoate-vilanterol (BREO ELLIPTA ) 100-25 MCG/ACT AEPB    No Alcohol for 24 hours before or after surgery.  No Smoking including e-cigarettes for 24 hours before surgery.  No chewable tobacco products for at least 6 hours before surgery.  No nicotine  patches on the day of surgery.  Do not use any "recreational" drugs for at least a week (preferably 2 weeks) before your surgery.  Please be advised that the combination of cocaine and anesthesia may have negative outcomes, up to and including death. If you  test positive for cocaine, your surgery will be cancelled.  On the morning of surgery brush your teeth with toothpaste and water , you may rinse your mouth with mouthwash if you wish. Do not swallow any toothpaste or mouthwash.  Get a fresh shower the morning of your procedure.   Do not wear jewelry, make-up, hairpins, clips or nail polish.  For welded (permanent) jewelry: bracelets, anklets, waist bands, etc.  Please have this removed prior to surgery.  If it is not removed, there is a chance that hospital personnel will need to cut it off on the day of surgery.  Do not wear lotions, powders, or perfumes.   Do not shave body hair from the neck down 48 hours before surgery.  Contact lenses, hearing aids and dentures may not be worn into surgery.  Do not bring valuables to the hospital. [facility] is not responsible for any missing/lost belongings or valuables.   Notify your doctor if there is any change in your medical condition (cold, fever, infection).  Wear comfortable clothing (specific to your surgery type) to the hospital.  After surgery, you can help prevent lung complications by doing breathing exercises.  Take deep breaths and cough every 1-2 hours. Your doctor may order a device called an Incentive Spirometer to help you take deep breaths. When coughing or sneezing, hold a pillow firmly against your incision with both hands. This is called "splinting." Doing this helps protect your incision. It also decreases belly discomfort.  If you are being admitted to the hospital overnight, leave your  suitcase in the car. After surgery it may be brought to your room.  In case of increased patient census, it may be necessary for you, the patient, to continue your postoperative care in the Same Day Surgery department.  If you are being discharged the day of surgery, you will not be allowed to drive home. You will need a responsible individual to drive you home and stay with you for 24  hours after surgery.   If you are taking public transportation, you will need to have a responsible individual with you.  Please call the Pre-admissions Testing Dept. at [phone] if you have any questions about these instructions.  Surgery Visitation Policy:  Patients having surgery or a procedure may have two visitors.  Children under the age of 71 must have an adult with them who is not the patient.  Inpatient Visitation:    Visiting hours are 7 a.m. to 8 p.m. Up to four visitors are allowed at one time in a patient room. The visitors may rotate out with other people during the day.  One visitor age 71 or older may stay with the patient overnight and must be in the room by 8 p.m.

## 2023-07-19 ENCOUNTER — Ambulatory Visit: Admitting: Urgent Care

## 2023-07-19 ENCOUNTER — Ambulatory Visit

## 2023-07-19 ENCOUNTER — Ambulatory Visit
Admission: RE | Admit: 2023-07-19 | Discharge: 2023-07-19 | Disposition: A | Discharge status: Home / Self Care | Attending: Urology | Admitting: Urology

## 2023-07-19 ENCOUNTER — Encounter: Payer: Self-pay | Admitting: Urology

## 2023-07-19 ENCOUNTER — Other Ambulatory Visit: Payer: Self-pay

## 2023-07-19 ENCOUNTER — Encounter: Payer: Self-pay | Admitting: *Deleted

## 2023-07-19 ENCOUNTER — Encounter
Admission: RE | Disposition: A | Discharge status: Home / Self Care | Payer: Self-pay | Source: Home / Self Care | Attending: Urology

## 2023-07-19 DIAGNOSIS — I739 Peripheral vascular disease, unspecified: Secondary | ICD-10-CM | POA: Diagnosis not present

## 2023-07-19 DIAGNOSIS — T83122A Displacement of urinary stent, initial encounter: Secondary | ICD-10-CM | POA: Insufficient documentation

## 2023-07-19 DIAGNOSIS — Z79890 Hormone replacement therapy (postmenopausal): Secondary | ICD-10-CM | POA: Diagnosis not present

## 2023-07-19 DIAGNOSIS — Z7951 Long term (current) use of inhaled steroids: Secondary | ICD-10-CM | POA: Insufficient documentation

## 2023-07-19 DIAGNOSIS — N201 Calculus of ureter: Secondary | ICD-10-CM | POA: Insufficient documentation

## 2023-07-19 DIAGNOSIS — E039 Hypothyroidism, unspecified: Secondary | ICD-10-CM | POA: Insufficient documentation

## 2023-07-19 DIAGNOSIS — N200 Calculus of kidney: Secondary | ICD-10-CM | POA: Diagnosis present

## 2023-07-19 DIAGNOSIS — Z7901 Long term (current) use of anticoagulants: Secondary | ICD-10-CM | POA: Insufficient documentation

## 2023-07-19 DIAGNOSIS — I4891 Unspecified atrial fibrillation: Secondary | ICD-10-CM | POA: Diagnosis not present

## 2023-07-19 DIAGNOSIS — J4489 Other specified chronic obstructive pulmonary disease: Secondary | ICD-10-CM | POA: Insufficient documentation

## 2023-07-19 DIAGNOSIS — K219 Gastro-esophageal reflux disease without esophagitis: Secondary | ICD-10-CM | POA: Diagnosis not present

## 2023-07-19 DIAGNOSIS — Y829 Unspecified medical devices associated with adverse incidents: Secondary | ICD-10-CM | POA: Diagnosis not present

## 2023-07-19 DIAGNOSIS — Z79899 Other long term (current) drug therapy: Secondary | ICD-10-CM | POA: Diagnosis not present

## 2023-07-19 DIAGNOSIS — I10 Essential (primary) hypertension: Secondary | ICD-10-CM | POA: Insufficient documentation

## 2023-07-19 DIAGNOSIS — I2510 Atherosclerotic heart disease of native coronary artery without angina pectoris: Secondary | ICD-10-CM | POA: Insufficient documentation

## 2023-07-19 DIAGNOSIS — Z87891 Personal history of nicotine dependence: Secondary | ICD-10-CM | POA: Insufficient documentation

## 2023-07-19 HISTORY — PX: CYSTOSCOPY/URETEROSCOPY/HOLMIUM LASER/STENT PLACEMENT: SHX6546

## 2023-07-19 LAB — CBC
HCT: 39.6 % (ref 36.0–46.0)
Hemoglobin: 11.7 g/dL — ABNORMAL LOW (ref 12.0–15.0)
MCH: 22.8 pg — ABNORMAL LOW (ref 26.0–34.0)
MCHC: 29.5 g/dL — ABNORMAL LOW (ref 30.0–36.0)
MCV: 77.2 fL — ABNORMAL LOW (ref 80.0–100.0)
Platelets: 267 10*3/uL (ref 150–400)
RBC: 5.13 MIL/uL — ABNORMAL HIGH (ref 3.87–5.11)
RDW: 14.8 % (ref 11.5–15.5)
WBC: 7.7 10*3/uL (ref 4.0–10.5)
nRBC: 0 % (ref 0.0–0.2)

## 2023-07-19 LAB — URINALYSIS, ROUTINE W REFLEX MICROSCOPIC
RBC / HPF: >50 RBC/hpf (ref 0–5)
Squamous Epithelial / HPF: 0 /HPF (ref 0–5)
WBC, UA: >50 WBC/hpf (ref 0–5)

## 2023-07-19 LAB — BASIC METABOLIC PANEL WITH GFR
Anion gap: 11 (ref 5–15)
BUN: 12 mg/dL (ref 8–23)
CO2: 24 mmol/L (ref 22–32)
Calcium: 9.2 mg/dL (ref 8.9–10.3)
Chloride: 105 mmol/L (ref 98–111)
Creatinine, Ser: 0.77 mg/dL (ref 0.44–1.00)
GFR, Estimated: >60 mL/min (ref >60)
Glucose, Bld: 156 mg/dL — ABNORMAL HIGH (ref 70–99)
Potassium: 3.6 mmol/L (ref 3.5–5.1)
Sodium: 140 mmol/L (ref 135–145)

## 2023-07-19 SURGERY — CYSTOSCOPY/URETEROSCOPY/HOLMIUM LASER/STENT PLACEMENT
Anesthesia: General | Site: Ureter | Laterality: Left

## 2023-07-19 MED ORDER — OXYCODONE HCL 5 MG PO TABS: 5.0000 mg | ORAL_TABLET | Freq: Four times a day (QID) | ORAL | 0 refills | Status: DC | PRN

## 2023-07-19 MED ORDER — STERILE WATER FOR IRRIGATION IR SOLN
Status: DC | PRN
  Administered 2023-07-19: 500 mL

## 2023-07-19 MED ORDER — ALBUTEROL SULFATE HFA 108 (90 BASE) MCG/ACT IN AERS
INHALATION_SPRAY | RESPIRATORY_TRACT | Status: AC
  Filled 2023-07-19: qty 6.7

## 2023-07-19 MED ORDER — LIDOCAINE HCL (PF) 2 % IJ SOLN
INTRAMUSCULAR | Status: AC
  Filled 2023-07-19: qty 5

## 2023-07-19 MED ORDER — ONDANSETRON HCL 4 MG/2ML IJ SOLN
INTRAMUSCULAR | Status: DC | PRN
  Administered 2023-07-19: 4 mg via INTRAVENOUS

## 2023-07-19 MED ORDER — FENTANYL CITRATE (PF) 100 MCG/2ML IJ SOLN
INTRAMUSCULAR | Status: DC | PRN
  Administered 2023-07-19 (×2): 50 ug via INTRAVENOUS

## 2023-07-19 MED ORDER — SUGAMMADEX SODIUM 200 MG/2ML IV SOLN
INTRAVENOUS | Status: DC | PRN
  Administered 2023-07-19: 200 mg via INTRAVENOUS

## 2023-07-19 MED ORDER — ROCURONIUM BROMIDE 100 MG/10ML IV SOLN
INTRAVENOUS | Status: DC | PRN
  Administered 2023-07-19: 50 mg via INTRAVENOUS

## 2023-07-19 MED ORDER — DEXAMETHASONE SODIUM PHOSPHATE 10 MG/ML IJ SOLN
INTRAMUSCULAR | Status: DC | PRN
  Administered 2023-07-19: 10 mg via INTRAVENOUS

## 2023-07-19 MED ORDER — SODIUM CHLORIDE 0.9 % IR SOLN
Status: DC | PRN
  Administered 2023-07-19: 3000 mL

## 2023-07-19 MED ORDER — ACETAMINOPHEN 10 MG/ML IV SOLN
INTRAVENOUS | Status: DC | PRN
  Administered 2023-07-19: 1000 mg via INTRAVENOUS

## 2023-07-19 MED ORDER — ALBUTEROL SULFATE HFA 108 (90 BASE) MCG/ACT IN AERS
INHALATION_SPRAY | RESPIRATORY_TRACT | Status: DC | PRN
Start: 2023-07-19 — End: 2023-07-19
  Administered 2023-07-19: 4 via RESPIRATORY_TRACT

## 2023-07-19 MED ORDER — PROPOFOL 10 MG/ML IV BOLUS
INTRAVENOUS | Status: AC
  Filled 2023-07-19: qty 20

## 2023-07-19 MED ORDER — FENTANYL CITRATE (PF) 100 MCG/2ML IJ SOLN
INTRAMUSCULAR | Status: AC
  Filled 2023-07-19: qty 2

## 2023-07-19 MED ORDER — IOHEXOL 180 MG/ML  SOLN
INTRAMUSCULAR | Status: DC | PRN
  Administered 2023-07-19: 10 mL

## 2023-07-19 MED ORDER — ACETAMINOPHEN 10 MG/ML IV SOLN
INTRAVENOUS | Status: AC
  Filled 2023-07-19: qty 100

## 2023-07-19 MED ORDER — PROPOFOL 10 MG/ML IV BOLUS
INTRAVENOUS | Status: DC | PRN
  Administered 2023-07-19: 130 mg via INTRAVENOUS

## 2023-07-19 MED ORDER — GLYCOPYRROLATE 0.2 MG/ML IJ SOLN
INTRAMUSCULAR | Status: DC | PRN
  Administered 2023-07-19: .1 mg via INTRAVENOUS

## 2023-07-19 MED ORDER — LIDOCAINE HCL (CARDIAC) PF 100 MG/5ML IV SOSY
PREFILLED_SYRINGE | INTRAVENOUS | Status: DC | PRN
  Administered 2023-07-19: 100 mg via INTRAVENOUS

## 2023-07-19 MED ORDER — ONDANSETRON HCL 4 MG/2ML IJ SOLN
INTRAMUSCULAR | Status: AC
  Filled 2023-07-19: qty 2

## 2023-07-19 MED ORDER — DEXAMETHASONE SODIUM PHOSPHATE 10 MG/ML IJ SOLN
INTRAMUSCULAR | Status: AC
  Filled 2023-07-19: qty 1

## 2023-07-19 SURGICAL SUPPLY — 23 items
BAG DRAIN SIEMENS DORNER NS (MISCELLANEOUS) ×1 IMPLANT
BASKET LASER NITINOL 1.9FR (BASKET) IMPLANT
BASKET ZERO TIP 1.9FR (BASKET) IMPLANT
BRUSH SCRUB EZ 4% CHG (MISCELLANEOUS) ×1 IMPLANT
CATH URET FLEX-TIP 2 LUMEN 10F (CATHETERS) IMPLANT
CATH URETL OPEN END 6X70 (CATHETERS) IMPLANT
CNTNR URN SCR LID CUP LEK RST (MISCELLANEOUS) IMPLANT
DRAPE UTILITY 15X26 TOWEL STRL (DRAPES) ×1 IMPLANT
FIBER LASER MOSES 200 DFL (Laser) IMPLANT
GLOVE BIOGEL PI IND STRL 7.5 (GLOVE) ×1 IMPLANT
GOWN STRL REUS W/ TWL LRG LVL3 (GOWN DISPOSABLE) ×1 IMPLANT
GOWN STRL REUS W/ TWL XL LVL3 (GOWN DISPOSABLE) ×1 IMPLANT
GUIDEWIRE STR DUAL SENSOR (WIRE) ×1 IMPLANT
KIT TURNOVER CYSTO (KITS) ×1 IMPLANT
PACK CYSTO AR (MISCELLANEOUS) ×1 IMPLANT
SET CYSTO W/LG BORE CLAMP LF (SET/KITS/TRAYS/PACK) ×1 IMPLANT
SHEATH NAVIGATOR HD 12/14X36 (SHEATH) IMPLANT
SOL .9 NS 3000ML IRR UROMATIC (IV SOLUTION) ×1 IMPLANT
STENT URET 6FRX24 CONTOUR (STENTS) IMPLANT
STENT URET 6FRX26 CONTOUR (STENTS) IMPLANT
SURGILUBE 2OZ TUBE FLIPTOP (MISCELLANEOUS) ×1 IMPLANT
VALVE UROSEAL ADJ ENDO (VALVE) IMPLANT
WATER STERILE IRR 500ML POUR (IV SOLUTION) ×1 IMPLANT

## 2023-07-19 NOTE — Anesthesia Preprocedure Evaluation (Signed)
 Anesthesia Evaluation  Patient identified by MRN, date of birth, ID band Patient awake    Reviewed: Allergy & Precautions, NPO status , Patient's Chart, lab work & pertinent test results  History of Anesthesia Complications Negative for: history of anesthetic complications  Airway Mallampati: III  TM Distance: >3 FB Neck ROM: full    Dental  (+) Chipped, Poor Dentition, Missing   Pulmonary asthma , COPD, former smoker   Pulmonary exam normal        Cardiovascular Exercise Tolerance: Good hypertension, (-) angina + CAD and + Peripheral Vascular Disease  + dysrhythmias Atrial Fibrillation      Neuro/Psych negative neurological ROS  negative psych ROS   GI/Hepatic Neg liver ROS,GERD  Controlled,,  Endo/Other  Hypothyroidism    Renal/GU Renal disease     Musculoskeletal   Abdominal   Peds  Hematology negative hematology ROS (+)   Anesthesia Other Findings Past Medical History: No date: Anemia No date: Arthritis 09/2015: Atrial flutter (HCC)     Comment:  a.) CHADS2VASc 2 (sex, HTN); b.) s/p DCCV 11/11/2015               (150 J); c.) s/p RFCA 01/17/2016; d.) rate/rhythm               maintained without pharmacological intervention; no               chronic anticoagulation. No date: Coronary artery calcification seen on CT scan 08/23/2019: Diastolic dysfunction     Comment:  a.)  TTE 08/23/2019: EF 55-60%, severe LAE, mild-mod MR,              PASP 46.2, G2DD No date: Dyspnea No date: Essential hypertension No date: GERD (gastroesophageal reflux disease) No date: Hepatic steatosis No date: History of kidney stones No date: Incomplete left bundle branch block (LBBB) No date: Paroxysmal A-fib (HCC) No date: Pneumonia     Comment:  as a child No date: Secondary hypercoagulable state (HCC) No date: Subclinical hypothyroidism No date: Tobacco abuse No date: Typical atrial flutter [facility])  Past Surgical  History: No date: ABDOMINAL HYSTERECTOMY 01/16/2016: ABLATION OF DYSRHYTHMIC FOCUS 05/24/2023: ATRIAL FIBRILLATION ABLATION; N/A     Comment:  Procedure: ATRIAL FIBRILLATION ABLATION;  Surgeon:               Boyce Byes, MD;  Location: [facility];                Service: Cardiovascular;  Laterality: N/A; 07/05/2023: CYSTOSCOPY WITH STENT PLACEMENT; Left     Comment:  Procedure: CYSTOSCOPY, WITH STENT INSERTION;  Surgeon:               [name], MD;  Location: [facility] OR[facility];  Service:               Urology;  Laterality: Left; 11/11/2015: ELECTROPHYSIOLOGIC STUDY; N/A     Comment:  Procedure: CARDIOVERSION;  Surgeon: Devorah Fonder,               MD;  Location: [facility] OR[facility];  Service: Cardiovascular;                Laterality: N/A; 01/16/2016: ELECTROPHYSIOLOGIC STUDY; N/A     Comment:  Procedure: A-Flutter Ablation;  Surgeon: Verona Goodwill,              MD;  Location: [facility];  Service:  Cardiovascular;  Laterality: N/A; 06/20/2023: LOWER EXTREMITY ANGIOGRAPHY; Right     Comment:  Procedure: Lower Extremity Angiography;  Surgeon: Celso College, MD;  Location: [facility];  Service:               Cardiovascular;  Laterality: Right; 12/10/2021: TOTAL HIP ARTHROPLASTY; Right     Comment:  Procedure: TOTAL HIP ARTHROPLASTY ANTERIOR APPROACH;                Surgeon: Venus Ginsberg, MD;  Location: [facility];                Service: Orthopedics;  Laterality: Right;  BMI    Body Mass Index: 28.09 kg/m      Reproductive/Obstetrics negative OB ROS                             Anesthesia Physical Anesthesia Plan  ASA: 3  Anesthesia Plan: General ETT   Post-op Pain Management:    Induction: Intravenous  PONV Risk Score and Plan: Ondansetron , Dexamethasone , Midazolam  and Treatment may vary due to age or medical condition  Airway Management Planned: Oral ETT  Additional Equipment:   Intra-op Plan:    Post-operative Plan: Extubation in OR  Informed Consent: I have reviewed the patients History and Physical, chart, labs and discussed the procedure including the risks, benefits and alternatives for the proposed anesthesia with the patient or authorized representative who has indicated his/her understanding and acceptance.     Dental Advisory Given  Plan Discussed with: Anesthesiologist, CRNA and Surgeon  Anesthesia Plan Comments: (Patient consented for risks of anesthesia including but not limited to:  - adverse reactions to medications - damage to eyes, teeth, lips or other oral mucosa - nerve damage due to positioning  - sore throat or hoarseness - Damage to heart, brain, nerves, lungs, other parts of body or loss of life  Patient voiced understanding and assent.)       Anesthesia Quick Evaluation

## 2023-07-19 NOTE — Interval H&P Note (Signed)
 History and Physical Interval Note:  07/19/2023 11:56 AM  [name]  has presented today for surgery, with the diagnosis of Left Nephrolithiasis.  The various methods of treatment have been discussed with the patient and family. After consideration of risks, benefits and other options for treatment, the patient has consented to  Procedure(s) with comments: CYSTOSCOPY/URETEROSCOPY/HOLMIUM LASER/STENT PLACEMENT (Left) - Exchange as a surgical intervention.  The patient's history has been reviewed, patient examined, no change in status, stable for surgery.  I have reviewed the patient's chart and labs.  Questions were answered to the patient's satisfaction.    CV: RRR Lungs: Clear  Shiv Shuey C Brandom Kerwin

## 2023-07-19 NOTE — Op Note (Signed)
   Preoperative diagnosis:  Left proximal ureteral calculus  Postoperative diagnosis:  Left proximal ureteral calculus  Procedure:  Cystoscopy Left ureteroscopy and stone removal Ureteroscopic laser lithotripsy Left ureteral stent exchange (11F/24 cm) Left retrograde pyelography with interpretation  Surgeon: Geralyn Knee C. Seann Genther, M.D.  Anesthesia: General  Complications: None  Intraoperative findings:  Cystoscopy: Bladder mucosa without solid or papillary lesions; UOs normal-appearing bilaterally.  Mild inflammatory changes left hemitrigone secondary to indwelling stent Ureteroscopy: Non-impacted calculus left proximal ureter just distal to the UPJ Left retrograde pyelography post procedure showed no filling defects, stone fragments or contrast extravasation  EBL: Minimal  Specimens: Calculus fragments for analysis   Indication: [name] is a 73 y.o.  female with a 9 mm left proximal ureteral calculus status post ureteral stent placement 07/06/2023 for an obstructing stone with renal colic and possible infection.  Urine culture from the renal pelvis at the time of stent placement showed no growth.  She presents today for definitive stone treatment.  After reviewing the management options for treatment, the patient elected to proceed with the above surgical procedure(s). We have discussed the potential benefits and risks of the procedure, side effects of the proposed treatment, the likelihood of the patient achieving the goals of the procedure, and any potential problems that might occur during the procedure or recuperation. Informed consent has been obtained.  Description of procedure:  The patient was taken to the operating room and general anesthesia was induced.  The patient was placed in the dorsal lithotomy position, prepped and draped in the usual sterile fashion, and preoperative antibiotics were administered. A preoperative time-out was performed.   A 21 French  cystoscope was lubricated and placed per urethra.  Panendoscopy was performed with findings as described above  Attention was directed to the left ureteral orifice.  The indwelling stent was grasped with endoscopic forceps and brought out through the urethral meatus.  The stent was cannulated with a 0.038 Sensor wire and advanced into the renal pelvis under fluoroscopic guidance.  After stent removal a 4.5 Fr semirigid ureteroscope was then advanced into the ureter next to the guidewire and the calculus was identified in the proximal ureter.  The stone was then fragmented with a 200 m Moses holmium laser fiber at a setting of 0.3 J/80 point hz.   All fragments were then removed from the ureter with a zero tip nitinol basket.  Reinspection of the ureter revealed no remaining visible stones or fragments.   The severe ureteroscope removed and a single channel digital flexible ureteroscope was placed per urethra and advanced into the left ureter alongside the guidewire without problems.  The scope was advanced into the renal pelvis.  Retrograde pyelogram was then performed through the ureteroscope.  All calyces were examined under fluoroscopic guidance and no additional calculi or stone fragments were visualized.  The ureteroscope was removed and a 6 F/24 cm Contour ureteral stent was placed under fluoroscopic guidance.  The wire was then removed with an adequate stent curl noted in the upper pole calyx as well as in the bladder.  The bladder was then emptied and the procedure ended.  The patient appeared to tolerate the procedure well and without complications.  After anesthetic reversal the patient was transported to the PACU in stable condition.   Plan: The stent was left attached to a tether and she was instructed to remove on 07/21/2023 Postop follow-up will be scheduled in approximately 1 month   Pamela Elam, MD

## 2023-07-19 NOTE — ED Notes (Signed)
 Pt voided in lobby bathroom, stent came out, bloody urine.   Pt reports urine cleared up after stent came out.  Bloody urine to lab.

## 2023-07-19 NOTE — Discharge Instructions (Addendum)
 DISCHARGE INSTRUCTIONS FOR KIDNEY STONE/URETERAL STENT   MEDICATIONS:  1. Resume all your other meds from home.  2.  AZO (over-the-counter) can help with the burning/stinging when you urinate. 3. oxycodone  is for moderate/severe pain, Rx was sent to your pharmacy.  ACTIVITY:  1. May resume regular activities in 24 hours. 2. No driving while on narcotic pain medications  3. Drink plenty of water   4. Continue to walk at home - you can still get blood clots when you are at home, so keep active, but don't over do it.  5. May return to work/school tomorrow or when you feel ready   BATHING:  1. You can shower. 2. You have a string coming from your urethra: The stent string is attached to your ureteral stent. Do not pull on this.   SIGNS/SYMPTOMS TO CALL:  Common postoperative symptoms include urinary frequency, urgency, bladder spasm and blood in the urine  Please call us  if you have a fever greater than 101.5, uncontrolled nausea/vomiting, uncontrolled pain, dizziness, unable to urinate, excessively bloody urine, chest pain, shortness of breath, leg swelling, leg pain, or any other concerns or questions.   You can reach us  at [phone].   FOLLOW-UP:  1. You will be contacted for a f/u appt 2. You have a string attached to your stent, you may remove it on Thursday 07/21/23. To do this, pull the string until the stent is completely removed. You may feel an odd sensation in your back.. Call our office if you would like us  to remove the stent

## 2023-07-19 NOTE — Anesthesia Procedure Notes (Signed)
 Procedure Name: Intubation Date/Time: 07/19/2023 12:18 PM  Performed by: Juanda Noon, CRNAPre-anesthesia Checklist: Patient identified, Patient being monitored, Timeout performed, Emergency Drugs available and Suction available Patient Re-evaluated:Patient Re-evaluated prior to induction Oxygen Delivery Method: Circle system utilized Preoxygenation: Pre-oxygenation with 100% oxygen Induction Type: IV induction Ventilation: Mask ventilation without difficulty Laryngoscope Size: Mac, 3 and McGrath Grade View: Grade I Tube type: Oral Tube size: 7.0 mm Number of attempts: 1 Airway Equipment and Method: Stylet Placement Confirmation: ETT inserted through vocal cords under direct vision, positive ETCO2 and breath sounds checked- equal and bilateral Secured at: 21 cm Tube secured with: Tape Dental Injury: Teeth and Oropharynx as per pre-operative assessment

## 2023-07-19 NOTE — ED Triage Notes (Signed)
 Pt to triage via wheelchair.  Pt had a urinary stent placed today per pt.  Pt now has urinary frequency and is passing blood .  Procedure done today at [facility],   pt alert  speech clear.

## 2023-07-19 NOTE — Transfer of Care (Signed)
 Immediate Anesthesia Transfer of Care Note  Patient: [name]  Procedure(s) Performed: CYSTOSCOPY/URETEROSCOPY/HOLMIUM LASER/STENT PLACEMENT (Left: Ureter)  Patient Location: PACU  Anesthesia Type:General  Level of Consciousness: awake and alert   Airway & Oxygen Therapy: Patient Spontanous Breathing and Patient connected to nasal cannula oxygen  Post-op Assessment: Report given to RN and Post -op Vital signs reviewed and stable  Post vital signs: Reviewed and stable  Last Vitals:  Vitals Value Taken Time  BP 166/72 07/19/23 1335  Temp    Pulse 78 07/19/23 1340  Resp 25 07/19/23 1340  SpO2 97 % 07/19/23 1340  Vitals shown include unfiled device data.  Last Pain:  Vitals:   07/19/23 1118  TempSrc: Temporal  PainSc: 0-No pain      Patients Stated Pain Goal: 0 (07/19/23 1118)  Complications: No notable events documented.

## 2023-07-20 ENCOUNTER — Emergency Department
Admission: EM | Admit: 2023-07-20 | Discharge: 2023-07-20 | Disposition: A | Discharge status: Home / Self Care | Attending: Emergency Medicine | Admitting: Emergency Medicine

## 2023-07-20 ENCOUNTER — Encounter: Payer: Self-pay | Admitting: Urology

## 2023-07-20 ENCOUNTER — Telehealth: Payer: Self-pay

## 2023-07-20 DIAGNOSIS — T83122A Displacement of urinary stent, initial encounter: Secondary | ICD-10-CM

## 2023-07-20 NOTE — Anesthesia Postprocedure Evaluation (Signed)
 Anesthesia Post Note  Patient: SHIA LHOTKA  Procedure(s) Performed: CYSTOSCOPY/URETEROSCOPY/HOLMIUM LASER/STENT PLACEMENT (Left: Ureter)  Patient location during evaluation: PACU Anesthesia Type: General Level of consciousness: awake and alert Pain management: pain level controlled Vital Signs Assessment: post-procedure vital signs reviewed and stable Respiratory status: spontaneous breathing, nonlabored ventilation, respiratory function stable and patient connected to nasal cannula oxygen Cardiovascular status: blood pressure returned to baseline and stable Postop Assessment: no apparent nausea or vomiting Anesthetic complications: no   No notable events documented.   Last Vitals:  Vitals:   07/19/23 1400 07/19/23 1413  BP: (!) 163/87 (!) 177/76  Pulse: 75 (!) 55  Resp: (!) 24 16  Temp: (!) 36.2 C (!) 36.3 C  SpO2: 97% 96%    Last Pain:  Vitals:   07/20/23 0928  TempSrc:   PainSc: 0-No pain                 Portia Brittle Breanna Mcdaniel

## 2023-07-20 NOTE — Telephone Encounter (Signed)
 Patient went to emergency room last night due to uncontrolled urination.  While she was there her stent came out.  She is doing better.  Follow up scheduled.

## 2023-07-20 NOTE — ED Provider Notes (Signed)
 [facility] Provider Note    Event Date/Time   First MD Initiated Contact with Patient 07/20/23 0032     (approximate)   History   Chief Complaint Urinary Frequency and Post-op Problem   HPI  [name] is a 73 y.o. female with past medical history of hypertension, atrial fibrillation on Eliquis , CAD, PAD, kidney stones, and hypothyroidism who presents to the ED complaining of hematuria.  Patient reports that she had a ureteral stent exchanged for a new 1 on the left side yesterday afternoon.  She was told to expect some bleeding after the procedure, does report passing small clots immediately afterwards that gradually became larger.  She then noticed that she was leaking urine whenever she went to stand up, denies any associated dysuria, fever, flank pain, or abdominal pain.  She decided to present to the ED for this issue, but states since he has arrived the stent has completely fallen out.  Since then, bleeding has improved and she is no longer having urinary incontinence.     Physical Exam   Triage Vital Signs: ED Triage Vitals [07/19/23 1924]  Encounter Vitals Group     BP (!) 169/80     Systolic BP Percentile      Diastolic BP Percentile      Pulse Rate 71     Resp 18     Temp 98.2 F (36.8 C)     Temp Source Oral     SpO2 100 %     Weight 170 lb (77.1 kg)     Height 5\' 5"  (1.651 m)     Head Circumference      Peak Flow      Pain Score 0     Pain Loc      Pain Education      Exclude from Growth Chart     Most recent vital signs: Vitals:   07/19/23 1924  BP: (!) 169/80  Pulse: 71  Resp: 18  Temp: 98.2 F (36.8 C)  SpO2: 100%    Constitutional: Alert and oriented. Eyes: Conjunctivae are normal. Head: Atraumatic. Nose: No congestion/rhinnorhea. Mouth/Throat: Mucous membranes are moist.  Cardiovascular: Normal rate, regular rhythm. Grossly normal heart sounds.  2+ radial pulses bilaterally. Respiratory: Normal respiratory  effort.  No retractions. Lungs CTAB. Gastrointestinal: Soft and nontender. No distention. Musculoskeletal: No lower extremity tenderness nor edema.  Neurologic:  Normal speech and language. No gross focal neurologic deficits are appreciated.    ED Results / Procedures / Treatments   Labs (all labs ordered are listed, but only abnormal results are displayed) Labs Reviewed  URINALYSIS, ROUTINE W REFLEX MICROSCOPIC - Abnormal; Notable for the following components:      Result Value   Color, Urine RED (*)    APPearance CLOUDY (*)    Glucose, UA   (*)    Value: TEST NOT REPORTED DUE TO COLOR INTERFERENCE OF URINE PIGMENT   Hgb urine dipstick   (*)    Value: TEST NOT REPORTED DUE TO COLOR INTERFERENCE OF URINE PIGMENT   Bilirubin Urine   (*)    Value: TEST NOT REPORTED DUE TO COLOR INTERFERENCE OF URINE PIGMENT   Ketones, ur   (*)    Value: TEST NOT REPORTED DUE TO COLOR INTERFERENCE OF URINE PIGMENT   Protein, ur   (*)    Value: TEST NOT REPORTED DUE TO COLOR INTERFERENCE OF URINE PIGMENT   Nitrite   (*)    Value: TEST NOT  REPORTED DUE TO COLOR INTERFERENCE OF URINE PIGMENT   Leukocytes,Ua   (*)    Value: TEST NOT REPORTED DUE TO COLOR INTERFERENCE OF URINE PIGMENT   Bacteria, UA FEW (*)    All other components within normal limits  BASIC METABOLIC PANEL WITH GFR - Abnormal; Notable for the following components:   Glucose, Bld 156 (*)    All other components within normal limits  CBC - Abnormal; Notable for the following components:   RBC 5.13 (*)    Hemoglobin 11.7 (*)    MCV 77.2 (*)    MCH 22.8 (*)    MCHC 29.5 (*)    All other components within normal limits    PROCEDURES:  Critical Care performed: No  Procedures   MEDICATIONS ORDERED IN ED: Medications - No data to display   IMPRESSION / MDM / ASSESSMENT AND PLAN / ED COURSE  I reviewed the triage vital signs and the nursing notes.                              73 y.o. female with past medical history of  hypertension, atrial fibrillation on Eliquis , CAD, PAD, kidney stones, and hypothyroidism who presents to the ED complaining of hematuria and urinary incontinence following ureteral stent placement yesterday.  Patient's presentation is most consistent with acute presentation with potential threat to life or bodily function.  Differential diagnosis includes, but is not limited to, UTI, pyelonephritis, kidney stone, displaced ureteral stent.  Patient nontoxic-appearing and in no acute distress, vital signs are unremarkable.  She states that since her stent has fallen out, she is essentially asymptomatic with improved hematuria and no ongoing incontinence.  Urinalysis with significant hematuria, but no symptoms to suggest urinary tract infection at this time.  Labs without significant anemia, leukocytosis, electrolyte abnormality, or AKI.  She is appropriate for outpatient management, was counseled to call urology in the morning regarding replacement of her stent.  She was counseled to return to the ED for new or worsening symptoms, patient agrees with plan.      FINAL CLINICAL IMPRESSION(S) / ED DIAGNOSES   Final diagnoses:  Displacement of ureteral stent, initial encounter [facility])     Rx / DC Orders   ED Discharge Orders     None        Note:  This document was prepared using Dragon voice recognition software and may include unintentional dictation errors.   Twilla Galea, MD 07/20/23 [phone]

## 2023-07-20 NOTE — ED Notes (Signed)
 Pt presented to ED with c/o incontinence and bloody urine starting yesterday evening. States had stent placed yesterday morning but while in lobby, stent came out. States still having hematuria. Pain 3/10. Jessup MD aware of elevated blood pressures, states pt is okay to be discharged at this time. Pt educated on DC instructions. All questions answered.

## 2023-07-22 ENCOUNTER — Ambulatory Visit: Admitting: Urology

## 2023-07-28 LAB — STONE ANALYSIS
Calcium Oxalate Monohydrate: 100 %
Weight Calculi: 19 mg

## 2023-07-29 ENCOUNTER — Ambulatory Visit (INDEPENDENT_AMBULATORY_CARE_PROVIDER_SITE_OTHER): Admitting: Vascular Surgery

## 2023-07-29 ENCOUNTER — Encounter (INDEPENDENT_AMBULATORY_CARE_PROVIDER_SITE_OTHER)

## 2023-08-02 ENCOUNTER — Encounter (INDEPENDENT_AMBULATORY_CARE_PROVIDER_SITE_OTHER): Payer: Self-pay

## 2023-08-04 NOTE — Progress Notes (Signed)
 Cardiology Clinic Note   Date: 08/05/2023 ID: [name], [name] [birth year], MRN [id number]  [facility] Providers Cardiologist:  Antionette Kirks, MD Electrophysiologist:  Boyce Byes, MD {  Chief Complaint   [name] is a 73 y.o. female who presents to the clinic today for routine follow up.   Patient Profile   [name] is followed by Dr. Alvenia Aus for the history outlined below.      Past medical history significant for: CAD. PAD. Lower extremity angiography 06/20/2023: PTA with drug-coated angioplasty balloon to right proximal SFA.  Mechanical thrombectomy right popliteal artery.  PTA with angioplasty balloon right popliteal artery.  Stent placement right peroneal artery.  Stent placement right tibioperoneal trunk.  Stent placement right popliteal artery.  Stent placement proximal right SFA. PAF/a-flutter. Onset July 2017. DCCV 11/11/2015. A-flutter ablation 01/16/2016. A-fib ablation 05/24/2023. Hypertension. Hyperlipidemia. Lipid panel 06/07/2023: LDL 53, HDL 35, TG 131, total 114. Former tobacco abuse. Ureteral calculus. S/p ureteral stent placement 07/06/2023.  In summary, patient has a history of Afib/flutter found during hospital admission July 2017.  She was rate controlled and started on Eliquis .  She underwent Lexiscan  which showed no evidence of ischemia.  Echo demonstrated EF 50 to 55%.  She underwent cardioversion in August 2017.  She underwent a flutter ablation in November 2017.  Patient continued to have sinus bradycardia post ablation.  Patient was evaluated by Dr. Marven Slimmer January 2025.  He reported 2024 she ate something that did not interact with her developed an irregular heartbeat.  Upon EMS arrival she was found to be in A-fib with RVR with rates in the 120s.  By the time.  She has a ambulance she was back in sinus rhythm.  She had not had another episode since.  Following her visit she was admitted and the ED twice for A-fib with RVR.   She underwent A-fib ablation on 05/24/2023.  Patient was last seen in the office by Adaline Holly, NP on 06/24/2023 for follow-up after ablation.  She was doing well at that time and maintaining sinus rhythm.     History of Present Illness    Today, patient is doing well. Patient denies shortness of breath, dyspnea on exertion, lower extremity edema, orthopnea or PND. No chest pain, pressure, or tightness. She has no palpitations. When she states when she is in afib she feels fluttering in the center of chest and feels "woozy." She has not had that sensation since her ablation in March. She reports heart rate ranges from the high 40s into the 60s. Heart rate increases with activity. She does not participate in routine exercise but stays active doing light to moderate household chores. Her activity is limited by left leg pain. She no longer has right leg pain since stent placement in April.     ROS: All other systems reviewed and are otherwise negative except as noted in History of Present Illness.  EKGs/Labs Reviewed    EKG Interpretation Date/Time:  Friday Aug 05 2023 11:48:21 EDT Ventricular Rate:  47 PR Interval:  172 QRS Duration:  110 QT Interval:  444 QTC Calculation: 392 R Axis:   93  Text Interpretation: Sinus bradycardia Rightward axis Cannot rule out Anterior infarct , age undetermined When compared with ECG of 04-Jul-2023 07:52, PREVIOUS ECG IS PRESENT Confirmed by Morey Ar [phone]) on 08/05/2023 11:54:31 AM   07/04/2023: ALT 12; AST 22 07/19/2023: BUN 12; Creatinine, Ser 0.77; Potassium 3.6; Sodium 140   07/19/2023: Hemoglobin 11.7;  WBC 7.7   11/09/2022: TSH 0.147   06/12/2023: B Natriuretic Peptide 177.4   Risk Assessment/Calculations     CHA2DS2-VASc Score = 4   This indicates a 4.8% annual risk of stroke. The patient's score is based upon: CHF History: 0 HTN History: 1 Diabetes History: 0 Stroke History: 0 Vascular Disease History: 1 Age Score: 1 Gender  Score: 1             Physical Exam    VS:  BP 130/60 (BP Location: Left Arm, Patient Position: Sitting, Cuff Size: Normal)   Pulse (!) 47   Ht 5\' 5"  (1.651 m)   Wt 168 lb (76.2 kg)   SpO2 97%   BMI 27.96 kg/m  , BMI Body mass index is 27.96 kg/m.  GEN: Well nourished, well developed, in no acute distress. Neck: No JVD or carotid bruits. Cardiac:  RRR. No murmurs. No rubs or gallops.   Respiratory:  Respirations regular and unlabored. Clear to auscultation without rales, wheezing or rhonchi. GI: Soft, nontender, nondistended. Extremities: Radials/DP/PT 2+ and equal bilaterally. No clubbing or cyanosis. No edema.  Skin: Warm and dry, no rash. Neuro: Strength intact.  Assessment & Plan   PAF/a-flutter/bradycardia Onset July 2017.  S/p DCCV August 2017, a-flutter ablation November 2017, A-fib ablation March 2025.  Denies spontaneous bleeding concerns.  Patient denies palpitations since ablation. EKG shows sinus bradycardia 47 bpm. She reports HR typically high 40s to 60s. She denies lightheadedness, dizziness, presyncope or syncope.  - Continue Eliquis .  Hypertension BP today 155/72 on intake and 130/60 on my recheck.  - Continue amlodipine , losartan .  Hyperlipidemia LDL 53 in March 2025, at goal. - Continue atorvastatin .  PAD S/p multiple stent placement in February 2025.  Patient reports she no longer has right lower extremity pain. She has pain in her left leg that she attributes to arthritis.  - Continue atorvastatin  and aspirin . - Continue to follow with vascular surgery.  Disposition:  Return in 1 year or sooner as needed.          Signed, Lonell Rives. Mariah Gerstenberger, DNP, NP-C

## 2023-08-05 ENCOUNTER — Encounter (INDEPENDENT_AMBULATORY_CARE_PROVIDER_SITE_OTHER): Payer: Self-pay | Admitting: Vascular Surgery

## 2023-08-05 ENCOUNTER — Ambulatory Visit (INDEPENDENT_AMBULATORY_CARE_PROVIDER_SITE_OTHER): Admitting: Vascular Surgery

## 2023-08-05 ENCOUNTER — Encounter: Payer: Self-pay | Admitting: Student

## 2023-08-05 ENCOUNTER — Ambulatory Visit (INDEPENDENT_AMBULATORY_CARE_PROVIDER_SITE_OTHER)

## 2023-08-05 ENCOUNTER — Ambulatory Visit: Attending: Student | Admitting: Student

## 2023-08-05 VITALS — BP 197/69 | HR 67 | Resp 18 | Ht 65.0 in | Wt 168.4 lb

## 2023-08-05 VITALS — BP 130/60 | HR 47 | Ht 65.0 in | Wt 168.0 lb

## 2023-08-05 DIAGNOSIS — I480 Paroxysmal atrial fibrillation: Secondary | ICD-10-CM | POA: Diagnosis not present

## 2023-08-05 DIAGNOSIS — I10 Essential (primary) hypertension: Secondary | ICD-10-CM

## 2023-08-05 DIAGNOSIS — I483 Typical atrial flutter: Secondary | ICD-10-CM | POA: Diagnosis not present

## 2023-08-05 DIAGNOSIS — E039 Hypothyroidism, unspecified: Secondary | ICD-10-CM

## 2023-08-05 DIAGNOSIS — D6869 Other thrombophilia: Secondary | ICD-10-CM

## 2023-08-05 DIAGNOSIS — R001 Bradycardia, unspecified: Secondary | ICD-10-CM | POA: Diagnosis not present

## 2023-08-05 DIAGNOSIS — I739 Peripheral vascular disease, unspecified: Secondary | ICD-10-CM

## 2023-08-05 DIAGNOSIS — Z9889 Other specified postprocedural states: Secondary | ICD-10-CM

## 2023-08-05 NOTE — Progress Notes (Signed)
 Subjective:    Patient ID: [name], female    DOB: 1950-03-31, 73 y.o.   MRN: [id number] Chief Complaint  Patient presents with   Follow-up    fu 4 weeks + AB    [name] is a 73 yo female who returns to clinic for 6 week post procedure check up from Right lower extremity angiogram with angioplasty of the right proximal SFA with stent placement, mechanical thrombectomy of the right popliteal artery with stent placement, and stent placement to the right peroneal artery as well as the right tibioperoneal trunk esprit scaffolding.  She had bilateral lower extremity ABIs completed today.  Right ABI today is 1.03.  Previous right ABI was 1.42. Left ABI today is 1.10 previous left ABI was 0.78  Patient denies any pain swelling or difficulties postprocedure.  Patient states her leg is stronger and she is walking better without any claudication symptoms or pain.  She endorses taking her aspirin  and Plavix daily.  She is recovering as expected.    Review of Systems  Constitutional: Negative.   HENT: Negative.    Respiratory: Negative.    Cardiovascular: Negative.   Gastrointestinal: Negative.   Endocrine: Negative.   Genitourinary: Negative.   Musculoskeletal: Negative.   Neurological: Negative.   Psychiatric/Behavioral: Negative.    All other systems reviewed and are negative.      Objective:    Physical Exam Vitals reviewed.  Constitutional:      Appearance: Normal appearance. She is normal weight.  HENT:     Head: Normocephalic.  Eyes:     Pupils: Pupils are equal, round, and reactive to light.  Cardiovascular:     Rate and Rhythm: Normal rate and regular rhythm.     Pulses: Normal pulses.     Heart sounds: Normal heart sounds.  Pulmonary:     Effort: Pulmonary effort is normal.     Breath sounds: Normal breath sounds.  Abdominal:     General: Abdomen is flat. Bowel sounds are normal.     Palpations: Abdomen is soft.  Musculoskeletal:        General:  Normal range of motion.     Cervical back: Normal range of motion.  Skin:    General: Skin is warm and dry.     Capillary Refill: Capillary refill takes 2 to 3 seconds.  Neurological:     General: No focal deficit present.     Mental Status: She is alert and oriented to person, place, and time. Mental status is at baseline.  Psychiatric:        Mood and Affect: Mood normal.        Behavior: Behavior normal.        Thought Content: Thought content normal.        Judgment: Judgment normal.     BP (!) 197/69   Pulse 67   Resp 18   Ht 5\' 5"  (1.651 m)   Wt 168 lb 6.4 oz (76.4 kg)   BMI 28.02 kg/m   Past Medical History:  Diagnosis Date   Anemia    Arthritis    Atrial flutter (HCC) 09/2015   a.) CHADS2VASc 2 (sex, HTN); b.) s/p DCCV 11/11/2015 (150 J); c.) s/p RFCA 01/17/2016; d.) rate/rhythm maintained without pharmacological intervention; no chronic anticoagulation.   Coronary artery calcification seen on CT scan    Diastolic dysfunction 08/23/2019   a.)  TTE 08/23/2019: EF 55-60%, severe LAE, mild-mod MR, PASP 46.2, G2DD   Dyspnea  Essential hypertension    GERD (gastroesophageal reflux disease)    Hepatic steatosis    History of kidney stones    Incomplete left bundle branch block (LBBB)    Paroxysmal A-fib (HCC)    Pneumonia    as a child   Secondary hypercoagulable state (HCC)    Subclinical hypothyroidism    Tobacco abuse    Typical atrial flutter (HCC)     Social History   Socioeconomic History   Marital status: Widowed    Spouse name: Not on file   Number of children: 4   Years of education: Not on file   Highest education level: Not on file  Occupational History   Not on file  Tobacco Use   Smoking status: Former    Current packs/day: 0.00    Types: Cigarettes    Quit date: 11/19/2015    Years since quitting: 7.7   Smokeless tobacco: Never  Vaping Use   Vaping status: Never Used  Substance and Sexual Activity   Alcohol use: Yes    Comment:  occasionally   Drug use: No   Sexual activity: Yes    Birth control/protection: Surgical  Other Topics Concern   Not on file  Social History Narrative   Lives with S.O. Tommy Haits 21 years. Youngest daughter stays with her also . No pets    Social Drivers of Corporate investment banker Strain: Not on file  Food Insecurity: No Food Insecurity (07/12/2023)   Hunger Vital Sign    Worried About Running Out of Food in the Last Year: Never true    Ran Out of Food in the Last Year: Never true  Transportation Needs: No Transportation Needs (07/12/2023)   PRAPARE - Administrator, Civil Service (Medical): No    Lack of Transportation (Non-Medical): No  Physical Activity: Not on file  Stress: Not on file  Social Connections: Socially Isolated (07/12/2023)   Social Connection and Isolation Panel [NHANES]    Frequency of Communication with Friends and Family: More than three times a week    Frequency of Social Gatherings with Friends and Family: More than three times a week    Attends Religious Services: Never    Database administrator or Organizations: No    Attends Banker Meetings: Never    Marital Status: Widowed  Intimate Partner Violence: Not At Risk (07/12/2023)   Humiliation, Afraid, Rape, and Kick questionnaire    Fear of Current or Ex-Partner: No    Emotionally Abused: No    Physically Abused: No    Sexually Abused: No    Past Surgical History:  Procedure Laterality Date   ABDOMINAL HYSTERECTOMY     ABLATION OF DYSRHYTHMIC FOCUS  01/16/2016   ATRIAL FIBRILLATION ABLATION N/A 05/24/2023   Procedure: ATRIAL FIBRILLATION ABLATION;  Surgeon: Boyce Byes, MD;  Location: [facility];  Service: Cardiovascular;  Laterality: N/A;   CYSTOSCOPY WITH STENT PLACEMENT Left 07/05/2023   Procedure: CYSTOSCOPY, WITH STENT INSERTION;  Surgeon: [name], MD;  Location: [facility];  Service: Urology;  Laterality: Left;   CYSTOSCOPY/URETEROSCOPY/HOLMIUM  LASER/STENT PLACEMENT Left 07/19/2023   Procedure: CYSTOSCOPY/URETEROSCOPY/HOLMIUM LASER/STENT PLACEMENT;  Surgeon: [name], MD;  Location: [facility] OR[facility];  Service: Urology;  Laterality: Left;  Exchange   ELECTROPHYSIOLOGIC STUDY N/A 11/11/2015   Procedure: CARDIOVERSION;  Surgeon: Devorah Fonder, MD;  Location: [facility];  Service: Cardiovascular;  Laterality: N/A;   ELECTROPHYSIOLOGIC STUDY N/A 01/16/2016   Procedure:  A-Flutter Ablation;  Surgeon: Verona Goodwill, MD;  Location: [facility] IN[facility]ASIVE CV LAB;  Service: Cardiovascular;  Laterality: N/A;   LOWER EXTREMITY ANGIOGRAPHY Right 06/20/2023   Procedure: Lower Extremity Angiography;  Surgeon: Celso College, MD;  Location: [facility] INVASIVE CV LAB;  Service: Cardiovascular;  Laterality: Right;   TOTAL HIP ARTHROPLASTY Right 12/10/2021   Procedure: TOTAL HIP ARTHROPLASTY ANTERIOR APPROACH;  Surgeon: Venus Ginsberg, MD;  Location: [facility] OR[facility];  Service: Orthopedics;  Laterality: Right;    Family History  Problem Relation Age of Onset   Hypertension Mother    Diabetes Mother    Diabetes Father    Diabetes Brother    Hypertension Brother    Hypertension Brother    Heart attack Brother    Diabetes Brother    Breast cancer Neg Hx     Allergies  Allergen Reactions   Lisinopril  Swelling    Swelling of lips and face.    Tizanidine  Other (See Comments)    bradycardia   Amoxicillin  Itching and Rash   Latex Itching and Rash       Latest Ref Rng & Units 07/19/2023    7:06 PM 07/14/2023    4:32 AM 07/13/2023    4:45 AM  CBC  WBC 4.0 - 10.5 K/uL 7.7  6.2  8.5   Hemoglobin 12.0 - 15.0 g/dL 91.4  78.2  95.6   Hematocrit 36.0 - 46.0 % 39.6  34.2  36.5   Platelets 150 - 400 K/uL 267  275  303        CMP     Component Value Date/Time   NA 140 07/19/2023 1906   NA 145 (H) 05/11/2023 0834   NA 142 01/06/2014 0038   K 3.6 07/19/2023 1906   K 3.9 01/06/2014 0038   CL 105 07/19/2023 1906   CL 108 (H) 01/06/2014 0038   CO2 24 07/19/2023 1906   CO2  27 01/06/2014 0038   GLUCOSE 156 (H) 07/19/2023 1906   GLUCOSE 96 01/06/2014 0038   BUN 12 07/19/2023 1906   BUN 16 05/11/2023 0834   BUN 10 01/06/2014 0038   CREATININE 0.77 07/19/2023 1906   CREATININE 0.64 01/06/2014 0038   CALCIUM  9.2 07/19/2023 1906   CALCIUM  8.5 01/06/2014 0038   PROT 6.8 07/04/2023 0351   PROT 7.8 07/14/2011 1311   ALBUMIN 3.8 07/04/2023 0351   ALBUMIN 4.3 07/14/2011 1311   AST 22 07/04/2023 0351   AST 18 07/14/2011 1311   ALT 12 07/04/2023 0351   ALT 13 07/14/2011 1311   ALKPHOS 53 07/04/2023 0351   ALKPHOS 63 07/14/2011 1311   BILITOT 0.5 07/04/2023 0351   BILITOT 0.5 07/14/2011 1311   EGFR 83 05/11/2023 0834   GFRNONAA >60 07/19/2023 1906   GFRNONAA >60 01/06/2014 0038   GFRNONAA >60 07/14/2011 1311     VAS US  ABI WITH/WO TBI Result Date: 06/13/2023  LOWER EXTREMITY DOPPLER STUDY Patient Name:  BRITINEY BLAHNIK  Date of Exam:   06/10/2023 Medical Rec #: [id number]        Accession #:    [id number] Date of Birth: 1950-05-03        Patient Gender: F Patient Age:   67 years Exam Location:  [facility] Procedure:      VAS US  ABI WITH/WO TBI Referring Phys: KEVIN PATEL --------------------------------------------------------------------------------  Indications: Claudication, rest pain, and peripheral artery disease. High Risk Factors: Hypertension, past history of smoking, coronary artery  disease. Other Factors: Broken right great toe nail after hitting her foot on furniture.  Performing Technologist: Oneta Bilberry RVT  Examination Guidelines: A complete evaluation includes at minimum, Doppler waveform signals and systolic blood pressure reading at the level of bilateral brachial, anterior tibial, and posterior tibial arteries, when vessel segments are accessible. Bilateral testing is considered an integral part of a complete examination. Photoelectric Plethysmograph (PPG) waveforms and toe systolic pressure readings are included as  required and additional duplex testing as needed. Limited examinations for reoccurring indications may be performed as noted.  ABI Findings: +---------+------------------+-----+----------+--------+ Right    Rt Pressure (mmHg)IndexWaveform  Comment  +---------+------------------+-----+----------+--------+ Brachial 163                                       +---------+------------------+-----+----------+--------+ PTA      70                0.42 monophasic         +---------+------------------+-----+----------+--------+ DP       63                0.38 monophasic         +---------+------------------+-----+----------+--------+ Great Toe13                0.08                    +---------+------------------+-----+----------+--------+ +---------+------------------+-----+----------+-------+ Left     Lt Pressure (mmHg)IndexWaveform  Comment +---------+------------------+-----+----------+-------+ Brachial 167                                      +---------+------------------+-----+----------+-------+ PTA      130               0.78 monophasic        +---------+------------------+-----+----------+-------+ DP       131               0.78 monophasic        +---------+------------------+-----+----------+-------+ Great Toe70                0.42                   +---------+------------------+-----+----------+-------+ +-------+-----------+-----------+------------+------------+ ABI/TBIToday's ABIToday's TBIPrevious ABIPrevious TBI +-------+-----------+-----------+------------+------------+ Right  0.42       0.08                                +-------+-----------+-----------+------------+------------+ Left   0.78       0.42                                +-------+-----------+-----------+------------+------------+  Limited imaging showed patent popliteal artery with monophasic Doppler waveform.  Summary: Right: Resting right ankle-brachial index indicates  severe right lower extremity arterial disease. The right toe-brachial index is abnormal. Left: Resting left ankle-brachial index indicates moderate left lower extremity arterial disease. The left toe-brachial index is abnormal. *See table(s) above for measurements and observations.  Electronically signed by Mikki Alexander MD on 06/13/2023 at 2:29:10 PM.    Final        Assessment & Plan:   1. Peripheral arterial disease with history of revascularization (HCC) (Primary) Patient has no complaints today  post right lower extremity angiogram with angioplasty, mechanical thrombectomy, and stent placement.  Percutaneous angioplasty of the right SFA with stent placement. Angioplasty of the right popliteal artery with stent placement to the popliteal artery Stent placement to the right peroneal artery and stent placement to the right tibioperoneal trunk with esprit scaffolding.   Plan is to have the patient follow-up in 3 months with arterial duplex ultrasounds with ABIs.   2. Essential hypertension Continue antihypertensive medications as already ordered, these medications have been reviewed and there are no changes at this time.  3. Hypothyroidism, unspecified type Patient is hypothyroid symptoms addressed today in clinic.  Patient will continue on the current medication regime.  No changes made today.   Current Outpatient Medications on File Prior to Visit  Medication Sig Dispense Refill   acetaminophen  (TYLENOL ) 650 MG CR tablet Take 650-1,300 mg by mouth every 8 (eight) hours as needed for pain.     albuterol  (VENTOLIN  HFA) 108 (90 Base) MCG/ACT inhaler Inhale 1-2 puffs into the lungs every 4 (four) hours as needed for wheezing or shortness of breath. Inhale 2-4 puffs by mouth every 4 hours as needed for wheezing, cough, and/or shortness of breath 8 g 1   amLODipine  (NORVASC ) 10 MG tablet Take 1 tablet (10 mg total) by mouth daily. 90 tablet 3   apixaban  (ELIQUIS ) 5 MG TABS tablet Take 1 tablet (5  mg total) by mouth 2 (two) times daily. 180 tablet 1   ascorbic acid (VITAMIN C) 500 MG tablet Take 500 mg by mouth daily.     aspirin  EC 81 MG tablet Take 1 tablet (81 mg total) by mouth daily. Swallow whole. 150 tablet 2   atorvastatin  (LIPITOR) 10 MG tablet Take 1 tablet (10 mg total) by mouth daily. 30 tablet 11   benzonatate  (TESSALON  PERLES) 100 MG capsule Take 1-2 tabs TID prn cough 30 capsule 0   cholecalciferol  (VITAMIN D ) 1000 units tablet Take 1,000 Units by mouth daily.     fluticasone  furoate-vilanterol (BREO ELLIPTA ) 100-25 MCG/ACT AEPB Inhale 1 puff into the lungs daily. 30 each 2   levothyroxine  (SYNTHROID ) 100 MCG tablet Take 100 mcg by mouth daily before breakfast.     lidocaine  (XYLOCAINE ) 5 % ointment Apply 1 Application topically as needed. (Patient taking differently: Apply 1 Application topically as needed for moderate pain (pain score 4-6).) 35.44 g 0   losartan  (COZAAR ) 100 MG tablet Take 1 tablet (100 mg total) by mouth daily. 90 tablet 3   oxybutynin  (DITROPAN -XL) 10 MG 24 hr tablet Take 1 tablet (10 mg total) by mouth at bedtime. 30 tablet 1   pantoprazole  (PROTONIX ) 40 MG tablet Take 1 tablet (40 mg total) by mouth daily. 45 tablet 0   potassium chloride  SA (KLOR-CON  M) 20 MEQ tablet Take 2 tablets (40 mEq total) by mouth daily for 7 days. 14 tablet 0   No current facility-administered medications on file prior to visit.    There are no Patient Instructions on file for this visit. No follow-ups on file.   Annamaria Barrette, NP

## 2023-08-05 NOTE — Patient Instructions (Signed)
 Medication Instructions:  Your Physician recommend you continue on your current medication as directed.    *If you need a refill on your cardiac medications before your next appointment, please call your pharmacy*  Lab Work: None ordered at this time  If you have labs (blood work) drawn today and your tests are completely normal, you will receive your results only by: MyChart Message (if you have MyChart) OR A paper copy in the mail If you have any lab test that is abnormal or we need to change your treatment, we will call you to review the results.  Testing/Procedures: None ordered at this time   Follow-Up: At [facility], you and your health needs are our priority.  As part of our continuing mission to provide you with exceptional heart care, our providers are all part of one team.  This team includes your primary Cardiologist (physician) and Advanced Practice Providers or APPs (Physician Assistants and Nurse Practitioners) who all work together to provide you with the care you need, when you need it.  Your next appointment:   12 month(s)  Provider:   You may see Antionette Kirks, MD or one of the following Advanced Practice Providers on your designated Care Team:   Laneta Pintos, NP Gildardo Labrador, PA-C Varney Gentleman, PA-C Cadence Tangip[place]oa, PA-C Ronald Cockayne, NP Morey Ar, NP    We recommend signing up for the patient portal called "MyChart".  Sign up information is provided on this After Visit Summary.  MyChart is used to connect with patients for Virtual Visits (Telemedicine).  Patients are able to view lab/test results, encounter notes, upcoming appointments, etc.  Non-urgent messages can be sent to your provider as well.   To learn more about what you can do with MyChart, go to [email or web address].

## 2023-08-09 LAB — VAS US ABI WITH/WO TBI
Left ABI: 1.1
Right ABI: 1.03

## 2023-08-12 ENCOUNTER — Ambulatory Visit
Admission: RE | Admit: 2023-08-12 | Discharge: 2023-08-12 | Disposition: A | Discharge status: Home / Self Care | Payer: 59 | Source: Ambulatory Visit | Attending: Nurse Practitioner | Admitting: Nurse Practitioner

## 2023-08-12 DIAGNOSIS — Z1231 Encounter for screening mammogram for malignant neoplasm of breast: Secondary | ICD-10-CM | POA: Diagnosis present

## 2023-08-22 ENCOUNTER — Encounter: Payer: Self-pay | Admitting: Urology

## 2023-08-22 ENCOUNTER — Ambulatory Visit (INDEPENDENT_AMBULATORY_CARE_PROVIDER_SITE_OTHER): Admitting: Urology

## 2023-08-22 VITALS — BP 147/68 | HR 56 | Ht 65.0 in | Wt 166.0 lb

## 2023-08-22 DIAGNOSIS — N200 Calculus of kidney: Secondary | ICD-10-CM

## 2023-08-22 NOTE — Patient Instructions (Signed)

## 2023-08-22 NOTE — Progress Notes (Signed)
 08/22/2023 12:53 PM   Sharon December Jul 07, 1950 [id number]  Referring provider: Inc, [facility] [address] [place],  [place] [address]  Chief Complaint  Patient presents with   Follow-up   Urologic history:  1.  Nephrolithiasis Stent placement 06/16/2023 for a 9 mm left proximal ureteral calculus with a possible infection Definitive stone treatment 07/19/2023 with ureteroscopy/stone removal Nonobstructing right renal calculi on CT Stone analysis 100% calcium  oxalate monohydrate   HPI: [name] is a 73 y.o. female who presents for postop follow-up.  Her stent came out several hours after her procedure and she had no problems with flank pain and is doing well Stone analysis 100% calcium  oxalate monohydrate  PMH: Past Medical History:  Diagnosis Date   Anemia    Arthritis    Atrial flutter (HCC) 09/2015   a.) CHADS2VASc 2 (sex, HTN); b.) s/p DCCV 11/11/2015 (150 J); c.) s/p RFCA 01/17/2016; d.) rate/rhythm maintained without pharmacological intervention; no chronic anticoagulation.   Coronary artery calcification seen on CT scan    Diastolic dysfunction 08/23/2019   a.)  TTE 08/23/2019: EF 55-60%, severe LAE, mild-mod MR, PASP 46.2, G2DD   Dyspnea    Essential hypertension    GERD (gastroesophageal reflux disease)    Hepatic steatosis    History of kidney stones    Incomplete left bundle branch block (LBBB)    Paroxysmal A-fib (HCC)    Pneumonia    as a child   Secondary hypercoagulable state (HCC)    Subclinical hypothyroidism    Tobacco abuse    Typical atrial flutter (HCC)     Surgical History: Past Surgical History:  Procedure Laterality Date   ABDOMINAL HYSTERECTOMY     ABLATION OF DYSRHYTHMIC FOCUS  01/16/2016   ATRIAL FIBRILLATION ABLATION N/A 05/24/2023   Procedure: ATRIAL FIBRILLATION ABLATION;  Surgeon: Boyce Byes, MD;  Location: [facility] INVASIVE CV LAB;  Service: Cardiovascular;  Laterality: N/A;   CYSTOSCOPY WITH STENT  PLACEMENT Left 07/05/2023   Procedure: CYSTOSCOPY, WITH STENT INSERTION;  Surgeon: [name], MD;  Location: [facility] ORS;  Service: Urology;  Laterality: Left;   CYSTOSCOPY/URETEROSCOPY/HOLMIUM LASER/STENT PLACEMENT Left 07/19/2023   Procedure: CYSTOSCOPY/URETEROSCOPY/HOLMIUM LASER/STENT PLACEMENT;  Surgeon: [name], MD;  Location: [facility] ORS;  Service: Urology;  Laterality: Left;  Exchange   ELECTROPHYSIOLOGIC STUDY N/A 11/11/2015   Procedure: CARDIOVERSION;  Surgeon: Devorah Fonder, MD;  Location: [facility] ORS;  Service: Cardiovascular;  Laterality: N/A;   ELECTROPHYSIOLOGIC STUDY N/A 01/16/2016   Procedure: A-Flutter Ablation;  Surgeon: Verona Goodwill, MD;  Location: [facility] INVASIVE CV LAB;  Service: Cardiovascular;  Laterality: N/A;   LOWER EXTREMITY ANGIOGRAPHY Right 06/20/2023   Procedure: Lower Extremity Angiography;  Surgeon: Celso College, MD;  Location: [facility] INVASIVE CV LAB;  Service: Cardiovascular;  Laterality: Right;   TOTAL HIP ARTHROPLASTY Right 12/10/2021   Procedure: TOTAL HIP ARTHROPLASTY ANTERIOR APPROACH;  Surgeon: Venus Ginsberg, MD;  Location: [facility] ORS;  Service: Orthopedics;  Laterality: Right;    Home Medications:  Allergies as of 08/22/2023       Reactions   Lisinopril  Swelling   Swelling of lips and face.    Tizanidine  Other (See Comments)   bradycardia   Amoxicillin  Itching, Rash   Latex Itching, Rash        Medication List        Accurate as of August 22, 2023 12:53 PM. If you have any questions, ask your nurse or doctor.  acetaminophen  650 MG CR tablet Commonly known as: TYLENOL  Take 650-1,300 mg by mouth every 8 (eight) hours as needed for pain.   albuterol  108 (90 Base) MCG/ACT inhaler Commonly known as: VENTOLIN  HFA Inhale 1-2 puffs into the lungs every 4 (four) hours as needed for wheezing or shortness of breath. Inhale 2-4 puffs by mouth every 4 hours as needed for wheezing, cough, and/or shortness of breath   amLODipine  10 MG  tablet Commonly known as: NORVASC  Take 1 tablet (10 mg total) by mouth daily.   apixaban  5 MG Tabs tablet Commonly known as: ELIQUIS  Take 1 tablet (5 mg total) by mouth 2 (two) times daily.   ascorbic acid 500 MG tablet Commonly known as: VITAMIN C Take 500 mg by mouth daily.   aspirin  EC 81 MG tablet Take 1 tablet (81 mg total) by mouth daily. Swallow whole.   atorvastatin  10 MG tablet Commonly known as: Lipitor Take 1 tablet (10 mg total) by mouth daily.   benzonatate  100 MG capsule Commonly known as: Tessalon  Perles Take 1-2 tabs TID prn cough   CeleBREX  100 MG capsule Generic drug: celecoxib  daily. 1-2 tabs prn for pain   cholecalciferol  1000 units tablet Commonly known as: VITAMIN D  Take 1,000 Units by mouth daily.   fluticasone  furoate-vilanterol 100-25 MCG/ACT Aepb Commonly known as: Breo Ellipta  Inhale 1 puff into the lungs daily.   levothyroxine  100 MCG tablet Commonly known as: SYNTHROID  Take 100 mcg by mouth daily before breakfast.   Lidocaine  (Anorectal) 5 % Crea as needed (leg pain).   lidocaine  5 % ointment Commonly known as: XYLOCAINE  Apply 1 Application topically as needed. What changed: reasons to take this   losartan  100 MG tablet Commonly known as: COZAAR  Take 1 tablet (100 mg total) by mouth daily.   oxybutynin  10 MG 24 hr tablet Commonly known as: DITROPAN -XL Take 1 tablet (10 mg total) by mouth at bedtime.   pantoprazole  40 MG tablet Commonly known as: Protonix  Take 1 tablet (40 mg total) by mouth daily.   potassium chloride  SA 20 MEQ tablet Commonly known as: KLOR-CON  M Take 2 tablets (40 mEq total) by mouth daily for 7 days.        Allergies:  Allergies  Allergen Reactions   Lisinopril  Swelling    Swelling of lips and face.    Tizanidine  Other (See Comments)    bradycardia   Amoxicillin  Itching and Rash   Latex Itching and Rash    Family History: Family History  Problem Relation Age of Onset   Hypertension Mother     Diabetes Mother    Diabetes Father    Diabetes Brother    Hypertension Brother    Hypertension Brother    Heart attack Brother    Diabetes Brother    Breast cancer Neg Hx     Social History:  reports that she quit smoking about 7 years ago. Her smoking use included cigarettes. She has never used smokeless tobacco. She reports current alcohol use. She reports that she does not use drugs.   Physical Exam: BP (!) 147/68   Pulse (!) 56   Ht 5\' 5"  (1.651 m)   Wt 166 lb (75.3 kg)   BMI 27.62 kg/m   Constitutional:  Alert and oriented, No acute distress. HEENT: [place] AT Respiratory: Normal respiratory effort, no increased work of breathing. Psychiatric: Normal mood and affect.   Assessment & Plan:    1.  Nephrolithiasis Doing well status post left ureteroscopic stone removal Nonobstructing right  renal calculi She has elected to pursue metabolic evaluation  36-month follow-up with KUB   [name], MD  [facility] [address] [place], [place] [address] [phone]

## 2023-08-24 ENCOUNTER — Other Ambulatory Visit: Payer: Self-pay

## 2023-08-24 ENCOUNTER — Encounter: Payer: Self-pay | Admitting: Emergency Medicine

## 2023-08-24 ENCOUNTER — Emergency Department
Admission: EM | Admit: 2023-08-24 | Discharge: 2023-08-24 | Disposition: A | Discharge status: Home / Self Care | Attending: Emergency Medicine | Admitting: Emergency Medicine

## 2023-08-24 DIAGNOSIS — Z23 Encounter for immunization: Secondary | ICD-10-CM | POA: Insufficient documentation

## 2023-08-24 DIAGNOSIS — S90411A Abrasion, right great toe, initial encounter: Secondary | ICD-10-CM | POA: Diagnosis not present

## 2023-08-24 DIAGNOSIS — Z7901 Long term (current) use of anticoagulants: Secondary | ICD-10-CM | POA: Insufficient documentation

## 2023-08-24 DIAGNOSIS — I10 Essential (primary) hypertension: Secondary | ICD-10-CM | POA: Insufficient documentation

## 2023-08-24 DIAGNOSIS — I2510 Atherosclerotic heart disease of native coronary artery without angina pectoris: Secondary | ICD-10-CM | POA: Insufficient documentation

## 2023-08-24 DIAGNOSIS — S99921A Unspecified injury of right foot, initial encounter: Secondary | ICD-10-CM | POA: Diagnosis present

## 2023-08-24 DIAGNOSIS — W268XXA Contact with other sharp object(s), not elsewhere classified, initial encounter: Secondary | ICD-10-CM | POA: Insufficient documentation

## 2023-08-24 MED ORDER — TETANUS-DIPHTH-ACELL PERTUSSIS 5-2.5-18.5 LF-MCG/0.5 IM SUSY
0.5000 mL | PREFILLED_SYRINGE | Freq: Once | INTRAMUSCULAR | Status: AC
  Administered 2023-08-24: 0.5 mL via INTRAMUSCULAR
  Filled 2023-08-24: qty 0.5

## 2023-08-24 NOTE — ED Triage Notes (Signed)
 Pt via POV from home. Pt here for laceration on the bottom of R great toe. Pt cut with a pedicure tool. Pt is on Eliquis . Pt is A&Ox4 and NAD, ambulatory to triage.

## 2023-08-24 NOTE — ED Provider Notes (Signed)
 [facility] Provider Note    Event Date/Time   First MD Initiated Contact with Patient 08/24/23 1325     (approximate)   History   Laceration   HPI  [name] is a 73 y.o. female with history of hypertension, atrial fibrillation/flutter on Eliquis , coronary artery disease, peripheral artery disease and as listed in EMR presents to the emergency department for treatment and evaluation of bleeding to the right great toe that started around 10 AM this morning.  She was giving herself a pedicure and was using a foot file when she started bleeding on the edge of her right great toe. She applied antibiotic ointment and pressure to the area but it continued to ooze blood.  She called her Investment banker, operational and they advised her to come here because she is on Eliquis  and has peripheral artery disease.Aaron Aas      Physical Exam   Triage Vital Signs: ED Triage Vitals  Encounter Vitals Group     BP 08/24/23 1308 (!) 190/80     Systolic BP Percentile --      Diastolic BP Percentile --      Pulse Rate 08/24/23 1308 (!) 52     Resp 08/24/23 1308 20     Temp 08/24/23 1308 98 F (36.7 C)     Temp src --      SpO2 08/24/23 1308 98 %     Weight 08/24/23 1303 172 lb (78 kg)     Height 08/24/23 1303 5' 5 (1.651 m)     Head Circumference --      Peak Flow --      Pain Score 08/24/23 1303 0     Pain Loc --      Pain Education --      Exclude from Growth Chart --     Most recent vital signs: Vitals:   08/24/23 1308  BP: (!) 190/80  Pulse: (!) 52  Resp: 20  Temp: 98 F (36.7 C)  SpO2: 98%    General: Awake, no distress.  CV:  Good peripheral perfusion.  Resp:  Normal effort.  Abd:  No distention.  Other:  Abrasion noted to the proximal, medial edge of the pad of the right great toe.  Scant bleeding noted.   ED Results / Procedures / Treatments   Labs (all labs ordered are listed, but only abnormal results are displayed) Labs Reviewed - No  data to display   EKG  Not indicated  RADIOLOGY  Image and radiology report reviewed and interpreted by me. Radiology report consistent with the same.  Not indicated  PROCEDURES:  Critical Care performed: No  Procedures   MEDICATIONS ORDERED IN ED:  Medications  Tdap (BOOSTRIX) injection 0.5 mL (0.5 mLs Intramuscular Given 08/24/23 1406)     IMPRESSION / MDM / ASSESSMENT AND PLAN / ED COURSE   I have reviewed the triage note.  Differential diagnosis includes, but is not limited to, skin laceration, abrasion  Patient's presentation is most consistent with acute, uncomplicated illness.  73 year old female presenting to the emergency department for evaluation of bleeding to the right great toe that started about 10:00 this morning during a self pedicure.  She was using a skin file and must have gone to deep.  Triage vital signs reviewed.  She is somewhat hypertensive but currently asymptomatic.  She states she has not taken her medications this morning.  On exam, she has some blood oozing from the abrasion to  the proximal area on the right great toe.  Area was cleaned with chlorhexidine  and normal saline then Dermabond was applied.  She will be monitored for a few minutes to ensure no return of bleeding before bandaging and discharge. Tdap to be updated today as well.  No return of bleeding after half an hour.  Postop shoe applied.  She was encouraged to stay off that foot as much as possible for the next few days to allow healing.  If she has any sign or concern of infection, she was encouraged to return to the emergency department.      FINAL CLINICAL IMPRESSION(S) / ED DIAGNOSES   Final diagnoses:  Abrasion of right great toe, initial encounter     Rx / DC Orders   ED Discharge Orders     None        Note:  This document was prepared using Dragon voice recognition software and may include unintentional dictation errors.   Sherryle Don, FNP 08/24/23  1439    Bryson Carbine, MD 08/24/23 [phone]

## 2023-08-24 NOTE — Discharge Instructions (Signed)
 Keep area clean and dry until the adhesive has worn away.  Stay off of the foot as much as possible until the skin has healed.  See primary care or return to the emergency department for any sign or concern of infection.

## 2023-08-28 NOTE — Progress Notes (Deleted)
 Electrophysiology Clinic Note    Date:  08/28/2023  Patient ID:  [name], [name] Oct 26, 1950, MRN [id number] PCP:  Inc, [facility]  Cardiologist:  Antionette Kirks, MD Electrophysiologist: Boyce Byes, MD   Discussed the use of AI scribe software for clinical note transcription with the patient, who gave verbal consent to proceed.   Patient Profile    Chief Complaint: Ablation follow-up  History of Present Illness: [name] is a 73 y.o. female with PMH notable for typical atrial flutter, parox afib, bradycardia, PAD, HTN, hypothyroid; seen today for Boyce Byes, MD for routine electrophysiology followup.   She is s/p Aflutter ablation 2017 by Dr. Rodolfo Clan. She is s/p AFib ablation w isolation of pulm veins, posterior wall on 05/24/2023 by Dr. Marven Slimmer.   She presented to ER with increased edema on 3/30. She was diagnosed with PAD with ulceration or R extremity and had angio with stents placed. She was started on ASA and eliquis  without bleeding concerns.    On follow-up today,  *** AF burden, symptoms *** palpitations *** bleeding concerns      Arrhythmia/Device History No specialty comments available.      ROS:  Please see the history of present illness. All other systems are reviewed and otherwise negative.    Physical Exam    VS:  There were no vitals taken for this visit. BMI: There is no height or weight on file to calculate BMI.  Wt Readings from Last 3 Encounters:  08/24/23 172 lb (78 kg)  08/22/23 166 lb (75.3 kg)  08/05/23 168 lb 6.4 oz (76.4 kg)     GEN- The patient is well appearing, alert and oriented x 3 today.   Lungs- Clear to ausculation bilaterally, normal work of breathing.  Heart- Regular rate and rhythm, no murmurs, rubs or gallops Extremities- No peripheral edema, warm, dry    Studies Reviewed   Previous EP, cardiology notes.    EKG is ordered. Personal review of EKG from today shows:          TTE, 06/07/2023  1. Left ventricular ejection fraction, by estimation, is 60 to 65%. The left ventricle has normal function. The left ventricle has no regional wall motion abnormalities. There is mild asymmetric left ventricular hypertrophy of the basal-septal segment. Left ventricular diastolic parameters are indeterminate.   2. Right ventricular systolic function is normal. The right ventricular size is normal. Tricuspid regurgitation signal is inadequate for assessing PA pressure.   3. Left atrial size was severely dilated.   4. Right atrial size was mildly dilated.   5. The mitral valve is normal in structure. Trivial mitral valve regurgitation. No evidence of mitral stenosis.   6. The aortic valve is normal in structure. Aortic valve regurgitation is not visualized. Aortic valve sclerosis is present, with no evidence of aortic valve stenosis.   7. The inferior vena cava is normal in size with greater than 50% respiratory variability, suggesting right atrial pressure of 3 mmHg.   Cardiac CTA, 05/16/2023 1. There is normal pulmonary vein drainage into the left atrium. (2 on the right and 2 on the left via a common trunk on left) with ostial measurements as above. 2. The left atrial appendage is a Windsock-cactus type with two lobes and ostial size 38 x 27 mm and length 46 mm, Area 74 mm2. There is no thrombus in the left atrial appendage.  3. The esophagus runs in the left atrial midline and is  not in the proximity to any of the pulmonary veins  4. Coronary calcium  score 672. This is 94th percentile for age/gender.   Assessment and Plan     #) parox AFib #) typical atrial flutter S/p Aflut ablation (2017), s/p AFib ablation recently Maintaining sinus rhythm since procedure  #) Hypercoag d/t parox afib CHA2DS2-VASc Score = at least 4 [CHF History: 0, HTN History: 1, Diabetes History: 0, Stroke History: 0, Vascular Disease History: 1, Age Score: 1, Gender Score: 1].  Therefore, the  patient's annual risk of stroke is 4.8 %.    Stroke ppx - 5mg  eliquis  BID, appropriately dosed No bleeding concerns  #) HTN At goal today.  Recommend checking blood pressures 1-2 times per week at home and recording the values.  Recommend bringing these recordings to the primary care physician. Continue 10mg  amlodipine , 100mg  losartan        Current medicines are reviewed at length with the patient today.   The patient does not have concerns regarding her medicines.  The following changes were made today:  none  Labs/ tests ordered today include:  No orders of the defined types were placed in this encounter.    Disposition: Follow up with Dr. Marven Slimmer or EP APP in 2 months    Signed, Mang Hazelrigg, NP  08/28/23  3:47 PM  Electrophysiology [facility]

## 2023-08-29 ENCOUNTER — Ambulatory Visit: Admitting: Cardiology

## 2023-08-29 ENCOUNTER — Other Ambulatory Visit

## 2023-08-29 DIAGNOSIS — N200 Calculus of kidney: Secondary | ICD-10-CM

## 2023-08-30 LAB — LITHOLINK SERUM PANEL
CO2: 23 mmol/L (ref 20–29)
Calcium: 9.8 mg/dL (ref 8.7–10.3)
Chloride: 103 mmol/L (ref 96–106)
Creatinine, Ser: 0.95 mg/dL (ref 0.57–1.00)
Magnesium: 2 mg/dL (ref 1.6–2.3)
Phosphorus: 3.3 mg/dL (ref 3.0–4.3)
Potassium: 4.4 mmol/L (ref 3.5–5.2)
Sodium: 141 mmol/L (ref 134–144)
Uric Acid: 5 mg/dL (ref 3.0–7.2)
eGFR: 64 mL/min/{1.73_m2} (ref >59)

## 2023-09-04 LAB — LITHOLINK 24HR URINE PANEL
Ammonium, Urine: 10 mmol/(24.h) — ABNORMAL LOW (ref 15–60)
Calcium Oxalate Saturation: 1.73 — ABNORMAL LOW (ref 6.00–10.00)
Calcium Phosphate Saturation: 0.34 — ABNORMAL LOW (ref 0.50–2.00)
Calcium, Urine: 35 mg/(24.h) (ref <200)
Calcium/Creatinine Ratio: 38 mg/g{creat} — ABNORMAL LOW (ref 51–262)
Calcium/Kg Body Weight: 0.5 mg/kg/d (ref <4.0)
Chloride, Urine: 140 mmol/(24.h) (ref 70–250)
Citrate, Urine: 627 mg/(24.h) (ref >550)
Creatinine, Urine: 928 mg/(24.h)
Creatinine/Kg Body Weight: 12.3 mg/kg/d (ref 8.7–20.3)
Cystine, Urine, Qualitative: NEGATIVE
Magnesium, Urine: 37 mg/(24.h) (ref 30–120)
Oxalate, Urine: 16 mg/(24.h) — ABNORMAL LOW (ref 20–40)
Phosphorus, Urine: 711 mg/(24.h) (ref 600–1200)
Potassium, Urine: 33 mmol/(24.h) (ref 20–100)
Protein Catabolic Rate: 0.6 g/kg/d — ABNORMAL LOW (ref 0.8–1.4)
Sodium, Urine: 161 mmol/(24.h) — ABNORMAL HIGH (ref 50–150)
Sulfate, Urine: 16 meq/(24.h) — ABNORMAL LOW (ref 20–80)
Urea Nitrogen, Urine: 4.56 g/(24.h) — ABNORMAL LOW (ref 6.00–14.00)
Uric Acid Saturation: 0.85 (ref <1.00)
Uric Acid, Urine: 368 mg/(24.h) (ref <750)
Urine Volume (Preserved): 950 mL/(24.h) (ref 500–4000)
pH, 24 hr, Urine: 5.94 (ref 5.800–6.200)

## 2023-10-26 ENCOUNTER — Emergency Department

## 2023-10-26 ENCOUNTER — Other Ambulatory Visit: Payer: Self-pay

## 2023-10-26 ENCOUNTER — Emergency Department
Admission: EM | Admit: 2023-10-26 | Discharge: 2023-10-26 | Disposition: A | Discharge status: Home / Self Care | Attending: Emergency Medicine | Admitting: Emergency Medicine

## 2023-10-26 DIAGNOSIS — R519 Headache, unspecified: Secondary | ICD-10-CM | POA: Insufficient documentation

## 2023-10-26 DIAGNOSIS — R531 Weakness: Secondary | ICD-10-CM | POA: Diagnosis not present

## 2023-10-26 DIAGNOSIS — I10 Essential (primary) hypertension: Secondary | ICD-10-CM | POA: Diagnosis not present

## 2023-10-26 DIAGNOSIS — R0789 Other chest pain: Secondary | ICD-10-CM | POA: Diagnosis present

## 2023-10-26 DIAGNOSIS — R0609 Other forms of dyspnea: Secondary | ICD-10-CM | POA: Insufficient documentation

## 2023-10-26 DIAGNOSIS — I447 Left bundle-branch block, unspecified: Secondary | ICD-10-CM | POA: Diagnosis not present

## 2023-10-26 DIAGNOSIS — R079 Chest pain, unspecified: Secondary | ICD-10-CM

## 2023-10-26 DIAGNOSIS — I4891 Unspecified atrial fibrillation: Secondary | ICD-10-CM | POA: Diagnosis not present

## 2023-10-26 LAB — CBC WITH DIFFERENTIAL/PLATELET
Abs Immature Granulocytes: 0.03 K/uL (ref 0.00–0.07)
Basophils Absolute: 0 K/uL (ref 0.0–0.1)
Basophils Relative: 0 %
Eosinophils Absolute: 0.1 K/uL (ref 0.0–0.5)
Eosinophils Relative: 1 %
HCT: 41.2 % (ref 36.0–46.0)
Hemoglobin: 12.7 g/dL (ref 12.0–15.0)
Immature Granulocytes: 0 %
Lymphocytes Relative: 19 %
Lymphs Abs: 1.6 K/uL (ref 0.7–4.0)
MCH: 22.6 pg — ABNORMAL LOW (ref 26.0–34.0)
MCHC: 30.8 g/dL (ref 30.0–36.0)
MCV: 73.4 fL — ABNORMAL LOW (ref 80.0–100.0)
Monocytes Absolute: 0.5 K/uL (ref 0.1–1.0)
Monocytes Relative: 6 %
Neutro Abs: 6.3 K/uL (ref 1.7–7.7)
Neutrophils Relative %: 74 %
Platelets: 212 K/uL (ref 150–400)
RBC: 5.61 MIL/uL — ABNORMAL HIGH (ref 3.87–5.11)
RDW: 16.3 % — ABNORMAL HIGH (ref 11.5–15.5)
WBC: 8.6 K/uL (ref 4.0–10.5)
nRBC: 0 % (ref 0.0–0.2)

## 2023-10-26 LAB — COMPREHENSIVE METABOLIC PANEL WITH GFR
ALT: 11 U/L (ref 0–44)
AST: 25 U/L (ref 15–41)
Albumin: 4.1 g/dL (ref 3.5–5.0)
Alkaline Phosphatase: 74 U/L (ref 38–126)
Anion gap: 8 (ref 5–15)
BUN: 12 mg/dL (ref 8–23)
CO2: 24 mmol/L (ref 22–32)
Calcium: 9.1 mg/dL (ref 8.9–10.3)
Chloride: 110 mmol/L (ref 98–111)
Creatinine, Ser: 0.65 mg/dL (ref 0.44–1.00)
GFR, Estimated: >60 mL/min (ref >60)
Glucose, Bld: 115 mg/dL — ABNORMAL HIGH (ref 70–99)
Potassium: 3.7 mmol/L (ref 3.5–5.1)
Sodium: 142 mmol/L (ref 135–145)
Total Bilirubin: 0.8 mg/dL (ref 0.0–1.2)
Total Protein: 7.8 g/dL (ref 6.5–8.1)

## 2023-10-26 LAB — BRAIN NATRIURETIC PEPTIDE: B Natriuretic Peptide: 485.5 pg/mL — ABNORMAL HIGH (ref 0.0–100.0)

## 2023-10-26 LAB — TROPONIN I (HIGH SENSITIVITY): Troponin I (High Sensitivity): 17 ng/L (ref <18)

## 2023-10-26 MED ORDER — DILTIAZEM HCL 25 MG/5ML IV SOLN
10.0000 mg | Freq: Once | INTRAVENOUS | Status: AC
  Administered 2023-10-26 (×2): 10 mg via INTRAVENOUS
  Filled 2023-10-26: qty 5

## 2023-10-26 MED ORDER — METOPROLOL SUCCINATE ER 25 MG PO TB24: 12.5000 mg | ORAL_TABLET | Freq: Every day | ORAL | 11 refills | Status: DC

## 2023-10-26 MED ORDER — DILTIAZEM HCL 60 MG PO TABS
30.0000 mg | ORAL_TABLET | Freq: Once | ORAL | Status: AC
  Administered 2023-10-26 (×2): 30 mg via ORAL
  Filled 2023-10-26: qty 1

## 2023-10-26 NOTE — ED Triage Notes (Signed)
 BIB [facility] from home for chest pain with dyspnea on exertion. Found to be in A. Fib on arrival. Hx of A fib. With an ablation in 2016.

## 2023-10-26 NOTE — ED Notes (Signed)
 On arrival, patient had to use the restroom, so EKG was obtained as soon as the patient was back in the bed. Provider aware.

## 2023-10-26 NOTE — ED Provider Notes (Signed)
 [facility] Provider Note   Event Date/Time   First MD Initiated Contact with Patient 10/26/23 2134     (approximate) History  Chest Pain  HPI [name] is a 73 y.o. female with a stated past medical history of of atrial fibrillation status post 2 ablation and currently not on any rate control medications as she states she has not been in A-fib since her last ablation which was months ago who presents via EMS complaining of palpitations with mild chest pain.  Patient states that she feels similar to when she has had atrial fibrillation in the past.  Patient also endorses dyspnea on exertion, generalized weakness, and mild headache. ROS: Patient currently denies any vision changes, tinnitus, difficulty speaking, facial droop, sore throat, abdominal pain, nausea/vomiting/diarrhea, dysuria, or weakness/numbness/paresthesias in any extremity   Physical Exam  Triage Vital Signs: ED Triage Vitals  Encounter Vitals Group     BP 10/26/23 2144 117/64     Girls Systolic BP Percentile --      Girls Diastolic BP Percentile --      Boys Systolic BP Percentile --      Boys Diastolic BP Percentile --      Pulse Rate 10/26/23 2141 (!) 121     Resp 10/26/23 2141 18     Temp 10/26/23 2141 98 F (36.7 C)     Temp Source 10/26/23 2141 Oral     SpO2 10/26/23 2141 100 %     Weight --      Height --      Head Circumference --      Peak Flow --      Pain Score 10/26/23 2136 0     Pain Loc --      Pain Education --      Exclude from Growth Chart --    Most recent vital signs: Vitals:   10/26/23 2332 10/26/23 2334  BP:    Pulse: 68 66  Resp: (!) 22   Temp:    SpO2: 100% 100%   General: Awake, oriented x4. CV:  Good peripheral perfusion.  Tachycardic irregularly irregular pulse Resp:  Normal effort. Abd:  No distention. Other:  Elderly overweight African-American female resting comfortably in no acute distress ED Results / Procedures / Treatments  Labs (all  labs ordered are listed, but only abnormal results are displayed) Labs Reviewed  COMPREHENSIVE METABOLIC PANEL WITH GFR - Abnormal; Notable for the following components:      Result Value   Glucose, Bld 115 (*)    All other components within normal limits  BRAIN NATRIURETIC PEPTIDE - Abnormal; Notable for the following components:   B Natriuretic Peptide 485.5 (*)    All other components within normal limits  CBC WITH DIFFERENTIAL/PLATELET - Abnormal; Notable for the following components:   RBC 5.61 (*)    MCV 73.4 (*)    MCH 22.6 (*)    RDW 16.3 (*)    All other components within normal limits  TROPONIN I (HIGH SENSITIVITY)   EKG ED ECG REPORT I, Artist MARLA Kerns, the attending physician, personally viewed and interpreted this ECG. Date: 10/26/2023 EKG Time: 2141 Rate: 122 Rhythm: Atrial fibrillation QRS Axis: normal Intervals: Incomplete left bundle branch block ST/T Wave abnormalities: normal Narrative Interpretation: Atrial fibrillation with incomplete left bundle branch block.  No evidence of acute ischemia RADIOLOGY ED MD interpretation: One-view portable chest x-ray interpreted by me shows no evidence of acute abnormalities including no pneumonia, pneumothorax, or widened  mediastinum - All radiology independently interpreted and agree with radiology assessment Official radiology report(s): DG Chest Port 1 View Result Date: 10/26/2023 CLINICAL DATA:  chest pain EXAM: PORTABLE CHEST - 1 VIEW COMPARISON:  June 23, 2023 FINDINGS: Streaky atelectasis in the left lung base. No focal airspace consolidation, pleural effusion, or pneumothorax. Mild cardiomegaly. Tortuous aorta with aortic atherosclerosis. No acute fracture or destructive lesions. Multilevel thoracic osteophytosis. Degenerative changes of both humeral heads. IMPRESSION: No acute cardiopulmonary abnormality. Electronically Signed   By: Rogelia Myers M.D.   On: 10/26/2023 22:26   PROCEDURES: Critical Care performed:  No Procedures MEDICATIONS ORDERED IN ED: Medications  diltiazem  (CARDIZEM ) injection 10 mg (10 mg Intravenous Given 10/26/23 2212)  diltiazem  (CARDIZEM ) tablet 30 mg (30 mg Oral Given 10/26/23 2211)   IMPRESSION / MDM / ASSESSMENT AND PLAN / ED COURSE  I reviewed the triage vital signs and the nursing notes.                             The patient is on the cardiac monitor to evaluate for evidence of arrhythmia and/or significant heart rate changes. Patient's presentation is most consistent with acute presentation with potential threat to life or bodily function. + atrial fibrillation w/ RVR DDx: Pneumothorax, Pneumonia, Pulmonary Embolus, Tamponade, ACS, Thyrotoxicosis.  No history or evidence decompensated heart failure. Given their history and exam it is likely this patient is unlikely to spontaneously revert to a rate controlled rhythm and necessitates a thorough workup for their arrhythmia. Workup: ECG, CXR, CBC, BMP, UA, Troponin, BNP, TSH, Ca-Mag-Phos Interventions: Defer Cardioversion (uncertain historical reliability with time of onset, increased risk of thromboembolic stroke).  Start diltiazem  bolus and oral diltiazem   Reassessment: Patient maintained rate controlled atrial fibrillation during multi-hour observation in ED. Disposition: Discharge home with prompt PCP follow up and cardiology referral.  FINAL CLINICAL IMPRESSION(S) / ED DIAGNOSES   Final diagnoses:  Atrial fibrillation, unspecified type (HCC)  Chest pain, unspecified type   Rx / DC Orders   ED Discharge Orders          Ordered    Ambulatory referral to Cardiology       Comments: If you have not heard from the Cardiology office within the next 72 hours please call [phone].   10/26/23 2325    metoprolol  succinate (TOPROL  XL) 25 MG 24 hr tablet  Daily        10/26/23 2326           Note:  This document was prepared using Dragon voice recognition software and may include unintentional dictation  errors.   Haizley Cannella K, MD 10/27/23 [phone]

## 2023-10-28 ENCOUNTER — Other Ambulatory Visit (INDEPENDENT_AMBULATORY_CARE_PROVIDER_SITE_OTHER): Payer: Self-pay | Admitting: Vascular Surgery

## 2023-10-28 DIAGNOSIS — Z9889 Other specified postprocedural states: Secondary | ICD-10-CM

## 2023-10-28 NOTE — Progress Notes (Unsigned)
 Cardiology Clinic Note   Date: 11/03/2023 ID: Pamela, Maddox [birth year], MRN [id number]  [facility] Providers Cardiologist:  Deatrice Cage, MD Electrophysiologist:  OLE ONEIDA HOLTS, MD   Chief Complaint   [name] is a 73 y.o. female who presents to the clinic today for hospital follow up.   Patient Profile   [name] is followed by Dr. Cage and Dr. HOLTS for the history outlined below.       Past medical history significant for: CAD. Cardiac CTA 05/16/2023 (part of A-fib ablation workup): Coronary calcium  score 672 (94th percentile). PAD. Lower extremity angiography 06/20/2023: PTA with drug-coated angioplasty balloon to right proximal SFA.  Mechanical thrombectomy right popliteal artery.  PTA with angioplasty balloon right popliteal artery.  Stent placement right peroneal artery.  Stent placement right tibioperoneal trunk.  Stent placement right popliteal artery.  Stent placement proximal right SFA. PAF/a-flutter. Onset July 2017. DCCV 11/11/2015. A-flutter ablation 01/16/2016. A-fib ablation 05/24/2023. Hypertension. Hyperlipidemia. Lipid panel 06/07/2023: LDL 53, HDL 35, TG 131, total 114. Former tobacco abuse. Ureteral calculus. S/p ureteral stent placement 07/06/2023.  In summary, patient has a history of Afib/flutter found during hospital admission July 2017.  She was rate controlled and started on Eliquis .  She underwent Lexiscan  which showed no evidence of ischemia.  Echo demonstrated EF 50 to 55%.  She underwent cardioversion in August 2017.  She underwent a flutter ablation in November 2017.  Patient continued to have sinus bradycardia post ablation.  Patient was evaluated by Dr. HOLTS January 2025.  He reported 2024 she ate something that did not interact with her developed an irregular heartbeat.  Upon EMS arrival she was found to be in A-fib with RVR with rates in the 120s.  By the time.  She has a ambulance she was back in sinus  rhythm.  She had not had another episode since.  Following her visit she was admitted and the ED twice for A-fib with RVR.  She underwent A-fib ablation on 05/24/2023. Upon follow up in April 2025 she was maintaining sinus rhythm.   Patient was last seen in the office by me on 08/05/2023 for routine follow-up.  She was doing well at that time.  She denied episodes of A-fib since ablation in March.  EKG demonstrated sinus bradycardia 47 bpm.  No medication changes were made.  Patient presented to the ED on 10/26/2023 for chest pain and DOE.  She was found to be in A-fib with RVR 122 bpm.  She was treated with IV diltiazem  with improvement in heart rate.  Initial labs: WBC 8.6, hemoglobin 12.7, hematocrit 41.2, sodium 142, potassium 3.7, creatinine 0.65, BUN 12, AST 25, ALT 11, BNP 485.5.  Troponin negative x 1.  She was discharged in rate controlled A-fib to follow-up with cardiology.  She was started on low-dose Toprol .     History of Present Illness    Today, patient is accompanied by her husband. She is doing well since ED discharge. Patient denies shortness of breath, dyspnea on exertion, lower extremity edema, orthopnea or PND. No chest pain, pressure, or tightness. No palpitations since ED visit. She reports feeling her heart race when she goes into afib. She has been Toprol  daily and notes her heart rate has been low dipping into the 40s. She denies lightheadedness, dizziness, presyncope or syncope. No blood in stool or urine.     ROS: All other systems reviewed and are otherwise negative except as noted in History of Present  Illness.  EKGs/Labs Reviewed    EKG Interpretation Date/Time:  Thursday November 03 2023 09:29:56 EDT Ventricular Rate:  43 PR Interval:  198 QRS Duration:  112 QT Interval:  452 QTC Calculation: 381 R Axis:   67  Text Interpretation: Marked sinus bradycardia with Premature atrial complexes Minimal voltage criteria for LVH, may be normal variant ( Cornell product )  Cannot rule out Anterior infarct , age undetermined When compared with ECG of 26-Oct-2023 21:41, PREVIOUS ECG IS PRESENT Confirmed by Loistine Sober [phone]) on 11/03/2023 9:32:55 AM   10/26/2023: ALT 11; AST 25; BUN 12; Creatinine, Ser 0.65; Potassium 3.7; Sodium 142   10/26/2023: Hemoglobin 12.7; WBC 8.6   11/09/2022: TSH 0.147   10/26/2023: B Natriuretic Peptide 485.5   Risk Assessment/Calculations     CHA2DS2-VASc Score = 4   This indicates a 4.8% annual risk of stroke. The patient's score is based upon: CHF History: 0 HTN History: 1 Diabetes History: 0 Stroke History: 0 Vascular Disease History: 1 Age Score: 1 Gender Score: 1             Physical Exam    VS:  BP 120/60 (BP Location: Left Arm, Patient Position: Sitting, Cuff Size: Normal)   Pulse (!) 43   Ht 5' 5 (1.651 m)   Wt 164 lb 12.8 oz (74.8 kg)   SpO2 99%   BMI 27.42 kg/m  , BMI Body mass index is 27.42 kg/m.  GEN: Well nourished, well developed, in no acute distress. Neck: No JVD or carotid bruits. Cardiac:  RRR.  No murmur. No rubs or gallops.   Respiratory:  Respirations regular and unlabored. Clear to auscultation without rales, wheezing or rhonchi. GI: Soft, nontender, nondistended. Extremities: Radials/DP/PT 2+ and equal bilaterally. No clubbing or cyanosis. No edema.  Skin: Warm and dry, no rash. Neuro: Strength intact.  Assessment & Plan   PAF/a-flutter/bradycardia Onset July 2017.  S/p DCCV August 2017, a-flutter ablation November 2017, A-fib ablation March 2025.  Recent ED visit 10/26/2023 for A-fib with RVR.  Treated with IV diltiazem .  Discharged in rate controlled A-fib with low-dose Toprol .  Denies spontaneous bleeding concerns.  She reports no further palpitations since ED discharge. She has cardiac awareness when she goes into afib describing feeling her heart race. Since taking Toprol  daily her heart rate has been low dipping into the 40s. She denies lightheadedness, dizziness, presyncope  or syncope. EKG today demonstrates sinus bradycardia 43 bpm.  - Stop daily Toprol . Can take a 1/2 tablet prn for heart racing.  - Continue Eliquis .   Hypertension BP today 120/60. No report of headaches or dizziness.  - Continue amlodipine , losartan .   Hyperlipidemia LDL 53 in March 2025, at goal. - Continue atorvastatin .   PAD S/p multiple stent placement in February 2025.  Patient reports she no longer has right lower extremity pain. She has pain in her left leg that she attributes to arthritis. - Continue atorvastatin  and aspirin . - Continue to follow with vascular surgery.  Disposition: Follow up with EP in 2-3 months. Return to gen cards in 1 year or sooner as needed.          Signed, Sober HERO. Meliyah Simon, DNP, NP-C

## 2023-11-01 ENCOUNTER — Ambulatory Visit (INDEPENDENT_AMBULATORY_CARE_PROVIDER_SITE_OTHER): Admitting: Nurse Practitioner

## 2023-11-01 ENCOUNTER — Encounter (INDEPENDENT_AMBULATORY_CARE_PROVIDER_SITE_OTHER)

## 2023-11-03 ENCOUNTER — Encounter: Payer: Self-pay | Admitting: Student

## 2023-11-03 ENCOUNTER — Ambulatory Visit: Attending: Student | Admitting: Student

## 2023-11-03 VITALS — BP 120/60 | HR 43 | Ht 65.0 in | Wt 164.8 lb

## 2023-11-03 DIAGNOSIS — I480 Paroxysmal atrial fibrillation: Secondary | ICD-10-CM

## 2023-11-03 DIAGNOSIS — I10 Essential (primary) hypertension: Secondary | ICD-10-CM

## 2023-11-03 DIAGNOSIS — R001 Bradycardia, unspecified: Secondary | ICD-10-CM | POA: Diagnosis not present

## 2023-11-03 DIAGNOSIS — I739 Peripheral vascular disease, unspecified: Secondary | ICD-10-CM

## 2023-11-03 DIAGNOSIS — E7800 Pure hypercholesterolemia, unspecified: Secondary | ICD-10-CM | POA: Diagnosis not present

## 2023-11-03 MED ORDER — METOPROLOL SUCCINATE ER 25 MG PO TB24: 12.5000 mg | ORAL_TABLET | ORAL | 11 refills | Status: AC | PRN

## 2023-11-03 NOTE — Patient Instructions (Signed)
 Medication Instructions:  Your physician has recommended you make the following change in your medication:   CHANGE Toprol  to half a tablet as needed for palpitations  *If you need a refill on your cardiac medications before your next appointment, please call your pharmacy*  Lab Work: None  If you have labs (blood work) drawn today and your tests are completely normal, you will receive your results only by: MyChart Message (if you have MyChart) OR A paper copy in the mail If you have any lab test that is abnormal or we need to change your treatment, we will call you to review the results.  Testing/Procedures: None  Follow-Up: At [facility], you and your health needs are our priority.  As part of our continuing mission to provide you with exceptional heart care, our providers are all part of one team.  This team includes your primary Cardiologist (physician) and Advanced Practice Providers or APPs (Physician Assistants and Nurse Practitioners) who all work together to provide you with the care you need, when you need it.  Your next appointment:   1 year(s)  Provider:   Deatrice Cage, MD or Barnie Hila, NP   Follow up with EP in 2-3 months

## 2023-11-29 ENCOUNTER — Telehealth (INDEPENDENT_AMBULATORY_CARE_PROVIDER_SITE_OTHER): Payer: Self-pay

## 2023-11-29 ENCOUNTER — Ambulatory Visit (INDEPENDENT_AMBULATORY_CARE_PROVIDER_SITE_OTHER): Admitting: Nurse Practitioner

## 2023-11-29 ENCOUNTER — Ambulatory Visit (INDEPENDENT_AMBULATORY_CARE_PROVIDER_SITE_OTHER)

## 2023-11-29 ENCOUNTER — Encounter (INDEPENDENT_AMBULATORY_CARE_PROVIDER_SITE_OTHER): Payer: Self-pay | Admitting: Nurse Practitioner

## 2023-11-29 VITALS — BP 152/70 | HR 43 | Ht 65.0 in | Wt 162.2 lb

## 2023-11-29 DIAGNOSIS — I739 Peripheral vascular disease, unspecified: Secondary | ICD-10-CM

## 2023-11-29 DIAGNOSIS — I10 Essential (primary) hypertension: Secondary | ICD-10-CM | POA: Diagnosis not present

## 2023-11-29 DIAGNOSIS — M5430 Sciatica, unspecified side: Secondary | ICD-10-CM

## 2023-11-29 DIAGNOSIS — Z9889 Other specified postprocedural states: Secondary | ICD-10-CM | POA: Diagnosis not present

## 2023-11-29 DIAGNOSIS — I70222 Atherosclerosis of native arteries of extremities with rest pain, left leg: Secondary | ICD-10-CM

## 2023-11-29 NOTE — Progress Notes (Signed)
 Subjective:    Patient ID: [name], female    DOB: 11/10/1950, 73 y.o.   MRN: [id number] Chief Complaint  Patient presents with   Follow-up    fu 3 months + Art Duplex + ABI see FB/BP Follow up  3 months + Art Duplex + ABI see FB/BP      The patient returns to the office for followup of atherosclerotic changes of the lower extremities and review of the noninvasive studies.   The patient notes that there has been a significant deterioration in the lower extremity symptoms.  The patient notes interval shortening of their claudication distance and development of mild rest pain symptoms. No new ulcers or wounds have occurred since the last visit.  There have been no significant changes to the patient's overall health care.  The patient denies amaurosis fugax or recent TIA symptoms. There are no recent neurological changes noted. There is no history of DVT, PE or superficial thrombophlebitis. The patient denies recent episodes of angina or shortness of breath.   ABI's Rt=0.97 and Lt=0.79 (previous ABI's Rt=1.03 and Lt=1.10) Duplex US  of the lower extremity arterial system shows tibial waveforms bilaterally.    Review of Systems  Musculoskeletal:  Positive for back pain.  All other systems reviewed and are negative.      Objective:   Physical Exam Vitals reviewed.  HENT:     Head: Normocephalic.  Neurological:     Mental Status: She is alert.     BP (!) 152/70   Pulse (!) 43   Ht 5' 5 (1.651 m)   Wt 162 lb 3.2 oz (73.6 kg)   BMI 26.99 kg/m   Past Medical History:  Diagnosis Date   Anemia    Arthritis    Atrial flutter (HCC) 09/2015   a.) CHADS2VASc 2 (sex, HTN); b.) s/p DCCV 11/11/2015 (150 J); c.) s/p RFCA 01/17/2016; d.) rate/rhythm maintained without pharmacological intervention; no chronic anticoagulation.   Coronary artery calcification seen on CT scan    Diastolic dysfunction 08/23/2019   a.)  TTE 08/23/2019: EF 55-60%, severe LAE, mild-mod MR,  PASP 46.2, G2DD   Dyspnea    Essential hypertension    GERD (gastroesophageal reflux disease)    Hepatic steatosis    History of kidney stones    Incomplete left bundle branch block (LBBB)    Paroxysmal A-fib (HCC)    Pneumonia    as a child   Secondary hypercoagulable state (HCC)    Subclinical hypothyroidism    Tobacco abuse    Typical atrial flutter (HCC)     Social History   Socioeconomic History   Marital status: Widowed    Spouse name: Not on file   Number of children: 4   Years of education: Not on file   Highest education level: Not on file  Occupational History   Not on file  Tobacco Use   Smoking status: Former    Current packs/day: 0.00    Types: Cigarettes    Quit date: 11/19/2015    Years since quitting: 8.0   Smokeless tobacco: Never  Vaping Use   Vaping status: Never Used  Substance and Sexual Activity   Alcohol use: Yes    Comment: occasionally   Drug use: No   Sexual activity: Yes    Birth control/protection: Surgical  Other Topics Concern   Not on file  Social History Narrative   Lives with S.O. Tommy Haits 21 years. Youngest daughter stays with her also . No  pets    Social Drivers of Corporate investment banker Strain: Not on file  Food Insecurity: No Food Insecurity (07/12/2023)   Hunger Vital Sign    Worried About Running Out of Food in the Last Year: Never true    Ran Out of Food in the Last Year: Never true  Transportation Needs: No Transportation Needs (07/12/2023)   PRAPARE - Administrator, Civil Service (Medical): No    Lack of Transportation (Non-Medical): No  Physical Activity: Not on file  Stress: Not on file  Social Connections: Socially Isolated (07/12/2023)   Social Connection and Isolation Panel    Frequency of Communication with Friends and Family: More than three times a week    Frequency of Social Gatherings with Friends and Family: More than three times a week    Attends Religious Services: Never    Automotive engineer or Organizations: No    Attends Banker Meetings: Never    Marital Status: Widowed  Intimate Partner Violence: Not At Risk (07/12/2023)   Humiliation, Afraid, Rape, and Kick questionnaire    Fear of Current or Ex-Partner: No    Emotionally Abused: No    Physically Abused: No    Sexually Abused: No    Past Surgical History:  Procedure Laterality Date   ABDOMINAL HYSTERECTOMY     ABLATION OF DYSRHYTHMIC FOCUS  01/16/2016   ATRIAL FIBRILLATION ABLATION N/A 05/24/2023   Procedure: ATRIAL FIBRILLATION ABLATION;  Surgeon: Cindie Ole DASEN, MD;  Location: [facility];  Service: Cardiovascular;  Laterality: N/A;   CYSTOSCOPY WITH STENT PLACEMENT Left 07/05/2023   Procedure: CYSTOSCOPY, WITH STENT INSERTION;  Surgeon: Twylla Glendia BROCKS, MD;  Location: [facility];  Service: Urology;  Laterality: Left;   CYSTOSCOPY/URETEROSCOPY/HOLMIUM LASER/STENT PLACEMENT Left 07/19/2023   Procedure: CYSTOSCOPY/URETEROSCOPY/HOLMIUM LASER/STENT PLACEMENT;  Surgeon: Twylla Glendia BROCKS, MD;  Location: [facility];  Service: Urology;  Laterality: Left;  Exchange   ELECTROPHYSIOLOGIC STUDY N/A 11/11/2015   Procedure: CARDIOVERSION;  Surgeon: Evalene JINNY Lunger, MD;  Location: [facility];  Service: Cardiovascular;  Laterality: N/A;   ELECTROPHYSIOLOGIC STUDY N/A 01/16/2016   Procedure: A-Flutter Ablation;  Surgeon: Elspeth BROCKS Sage, MD;  Location: [facility] CV [facility];  Service: Cardiovascular;  Laterality: N/A;   LOWER EXTREMITY ANGIOGRAPHY Right 06/20/2023   Procedure: Lower Extremity Angiography;  Surgeon: Marea Selinda RAMAN, MD;  Location: [facility]AS[facility];  Service: Cardiovascular;  Laterality: Right;   TOTAL HIP ARTHROPLASTY Right 12/10/2021   Procedure: TOTAL HIP ARTHROPLASTY ANTERIOR APPROACH;  Surgeon: Lorelle Hussar, MD;  Location: [facility];  Service: Orthopedics;  Laterality: Right;    Family History  Problem Relation Age of Onset   Hypertension Mother    Diabetes Mother    Diabetes Father     Diabetes Brother    Hypertension Brother    Hypertension Brother    Heart attack Brother    Diabetes Brother    Breast cancer Neg Hx     Allergies  Allergen Reactions   Lisinopril  Swelling    Swelling of lips and face.    Tizanidine  Other (See Comments)    bradycardia   Amoxicillin  Itching and Rash   Latex Itching and Rash       Latest Ref Rng & Units 10/26/2023    9:45 PM 07/19/2023    7:06 PM 07/14/2023    4:32 AM  CBC  WBC 4.0 - 10.5 K/uL 8.6  7.7  6.2   Hemoglobin 12.0 - 15.0  g/dL 87.2  88.2  89.3   Hematocrit 36.0 - 46.0 % 41.2  39.6  34.2   Platelets 150 - 400 K/uL 212  267  275       CMP     Component Value Date/Time   NA 142 10/26/2023 2145   NA 141 08/29/2023 1118   NA 142 01/06/2014 0038   K 3.7 10/26/2023 2145   K 3.9 01/06/2014 0038   CL 110 10/26/2023 2145   CL 108 (H) 01/06/2014 0038   CO2 24 10/26/2023 2145   CO2 27 01/06/2014 0038   GLUCOSE 115 (H) 10/26/2023 2145   GLUCOSE 96 01/06/2014 0038   BUN 12 10/26/2023 2145   BUN 16 05/11/2023 0834   BUN 10 01/06/2014 0038   CREATININE 0.65 10/26/2023 2145   CREATININE 0.64 01/06/2014 0038   CALCIUM  9.1 10/26/2023 2145   CALCIUM  8.5 01/06/2014 0038   PROT 7.8 10/26/2023 2145   PROT 7.8 07/14/2011 1311   ALBUMIN 4.1 10/26/2023 2145   ALBUMIN 4.3 07/14/2011 1311   AST 25 10/26/2023 2145   AST 18 07/14/2011 1311   ALT 11 10/26/2023 2145   ALT 13 07/14/2011 1311   ALKPHOS 74 10/26/2023 2145   ALKPHOS 63 07/14/2011 1311   BILITOT 0.8 10/26/2023 2145   BILITOT 0.5 07/14/2011 1311   EGFR 64 08/29/2023 1118   GFRNONAA >60 10/26/2023 2145   GFRNONAA >60 01/06/2014 0038   GFRNONAA >60 07/14/2011 1311     No results found.     Assessment & Plan:   1. Atherosclerosis of native artery of left lower extremity with rest pain (HCC) (Primary) Recommend:  The patient has experienced increased claudication symptoms and is now describing lifestyle limiting claudication and appears to be having mild  rest pain symptroms.  Given the severity of the patient's severe left lower extremity symptoms the patient should undergo angiography with the hope for intervention.  Risk and benefits were reviewed the patient.  Indications for the procedure were reviewed.  All questions were answered, the patient agrees to proceed with left lower extremity angiography and possible intervention.   The patient should continue walking and begin a more formal exercise program.  The patient should continue antiplatelet therapy and aggressive treatment of the lipid abnormalities  The patient will follow up with me after the angiogram.   2. Essential hypertension Continue antihypertensive medications as already ordered, these medications have been reviewed and there are no changes at this time.  3. Sciatica, unspecified laterality The patient's left leg pain may be related to sciatica but her symptoms are so close related to claudication or rest pain and given her diminished studies today I think it would be prudent to move forward with intervention.   Current Outpatient Medications on File Prior to Visit  Medication Sig Dispense Refill   acetaminophen  (TYLENOL ) 650 MG CR tablet Take 650-1,300 mg by mouth every 8 (eight) hours as needed for pain.     albuterol  (VENTOLIN  HFA) 108 (90 Base) MCG/ACT inhaler Inhale 1-2 puffs into the lungs every 4 (four) hours as needed for wheezing or shortness of breath. Inhale 2-4 puffs by mouth every 4 hours as needed for wheezing, cough, and/or shortness of breath 8 g 1   amLODipine  (NORVASC ) 10 MG tablet Take 1 tablet (10 mg total) by mouth daily. 90 tablet 3   apixaban  (ELIQUIS ) 5 MG TABS tablet Take 1 tablet (5 mg total) by mouth 2 (two) times daily. 180 tablet 1   ascorbic acid (  VITAMIN C) 500 MG tablet Take 500 mg by mouth daily.     aspirin  EC 81 MG tablet Take 1 tablet (81 mg total) by mouth daily. Swallow whole. 150 tablet 2   atorvastatin  (LIPITOR) 10 MG tablet Take 1  tablet (10 mg total) by mouth daily. 30 tablet 11   CELEBREX  100 MG capsule daily. 1-2 tabs prn for pain     cholecalciferol  (VITAMIN D ) 1000 units tablet Take 1,000 Units by mouth daily.     levothyroxine  (SYNTHROID ) 100 MCG tablet Take 100 mcg by mouth daily before breakfast.     Lidocaine , Anorectal, 5 % CREA as needed (leg pain).     losartan  (COZAAR ) 100 MG tablet Take 1 tablet (100 mg total) by mouth daily. 90 tablet 3   metoprolol  succinate (TOPROL  XL) 25 MG 24 hr tablet Take 0.5 tablets (12.5 mg total) by mouth as needed (As needed for palpitations). 45 tablet 11   pantoprazole  (PROTONIX ) 40 MG tablet Take 1 tablet (40 mg total) by mouth daily. 45 tablet 0   benzonatate  (TESSALON  PERLES) 100 MG capsule Take 1-2 tabs TID prn cough (Patient not taking: Reported on 11/29/2023) 30 capsule 0   lidocaine  (XYLOCAINE ) 5 % ointment Apply 1 Application topically as needed. (Patient taking differently: Apply 1 Application topically as needed for moderate pain (pain score 4-6).) 35.44 g 0   oxybutynin  (DITROPAN -XL) 10 MG 24 hr tablet Take 1 tablet (10 mg total) by mouth at bedtime. (Patient not taking: Reported on 11/29/2023) 30 tablet 1   potassium chloride  SA (KLOR-CON  M) 20 MEQ tablet Take 2 tablets (40 mEq total) by mouth daily for 7 days. 14 tablet 0   No current facility-administered medications on file prior to visit.    There are no Patient Instructions on file for this visit. No follow-ups on file.   Kethan Papadopoulos E Elmo Shumard, NP

## 2023-11-29 NOTE — Telephone Encounter (Signed)
 Spoke with the patient and she is scheduled with Dr. Marea for a LLE angio on 12/01/23 with a 6:45 am arrival time to the [facility]. Pre-procedure instructions were discussed and will be sent to Mychart

## 2023-11-30 LAB — VAS US ABI WITH/WO TBI
Left ABI: 0.79
Right ABI: 0.97

## 2023-12-01 ENCOUNTER — Ambulatory Visit
Admission: RE | Admit: 2023-12-01 | Discharge: 2023-12-01 | Disposition: A | Discharge status: Home / Self Care | Attending: Vascular Surgery | Admitting: Vascular Surgery

## 2023-12-01 ENCOUNTER — Other Ambulatory Visit: Payer: Self-pay

## 2023-12-01 ENCOUNTER — Encounter: Payer: Self-pay | Admitting: Vascular Surgery

## 2023-12-01 ENCOUNTER — Encounter
Admission: RE | Disposition: A | Discharge status: Home / Self Care | Payer: Self-pay | Source: Home / Self Care | Attending: Vascular Surgery

## 2023-12-01 DIAGNOSIS — I70229 Atherosclerosis of native arteries of extremities with rest pain, unspecified extremity: Secondary | ICD-10-CM

## 2023-12-01 DIAGNOSIS — M5430 Sciatica, unspecified side: Secondary | ICD-10-CM | POA: Diagnosis not present

## 2023-12-01 DIAGNOSIS — I10 Essential (primary) hypertension: Secondary | ICD-10-CM | POA: Insufficient documentation

## 2023-12-01 DIAGNOSIS — Z87891 Personal history of nicotine dependence: Secondary | ICD-10-CM | POA: Insufficient documentation

## 2023-12-01 DIAGNOSIS — Z9889 Other specified postprocedural states: Secondary | ICD-10-CM | POA: Diagnosis not present

## 2023-12-01 DIAGNOSIS — I70222 Atherosclerosis of native arteries of extremities with rest pain, left leg: Secondary | ICD-10-CM | POA: Insufficient documentation

## 2023-12-01 HISTORY — PX: LOWER EXTREMITY ANGIOGRAPHY: CATH118251

## 2023-12-01 LAB — CREATININE, SERUM
Creatinine, Ser: 0.75 mg/dL (ref 0.44–1.00)
GFR, Estimated: >60 mL/min (ref >60)

## 2023-12-01 LAB — BUN: BUN: 14 mg/dL (ref 8–23)

## 2023-12-01 SURGERY — LOWER EXTREMITY ANGIOGRAPHY
Anesthesia: Moderate Sedation | Site: Leg Lower | Laterality: Left

## 2023-12-01 MED ORDER — ONDANSETRON HCL 4 MG/2ML IJ SOLN: 4.0000 mg | Freq: Four times a day (QID) | INTRAMUSCULAR | Status: DC | PRN

## 2023-12-01 MED ORDER — HYDROMORPHONE HCL 1 MG/ML IJ SOLN: 1.0000 mg | Freq: Once | INTRAMUSCULAR | Status: DC | PRN

## 2023-12-01 MED ORDER — SODIUM CHLORIDE 0.9% FLUSH: 3.0000 mL | Freq: Two times a day (BID) | INTRAVENOUS | Status: DC

## 2023-12-01 MED ORDER — MIDAZOLAM HCL 5 MG/5ML IJ SOLN
INTRAMUSCULAR | Status: AC
  Filled 2023-12-01: qty 5

## 2023-12-01 MED ORDER — DIPHENHYDRAMINE HCL 50 MG/ML IJ SOLN: 50.0000 mg | Freq: Once | INTRAMUSCULAR | Status: DC | PRN

## 2023-12-01 MED ORDER — HEPARIN SODIUM (PORCINE) 1000 UNIT/ML IJ SOLN
INTRAMUSCULAR | Status: AC
  Filled 2023-12-01: qty 10

## 2023-12-01 MED ORDER — SODIUM CHLORIDE 0.9 % IV SOLN: INTRAVENOUS | Status: DC

## 2023-12-01 MED ORDER — CEFAZOLIN SODIUM-DEXTROSE 2-4 GM/100ML-% IV SOLN
INTRAVENOUS | Status: AC
  Filled 2023-12-01: qty 100

## 2023-12-01 MED ORDER — MIDAZOLAM HCL 2 MG/2ML IJ SOLN
INTRAMUSCULAR | Status: DC | PRN
  Administered 2023-12-01: 2 mg via INTRAVENOUS
  Administered 2023-12-01 (×2): .5 mg via INTRAVENOUS

## 2023-12-01 MED ORDER — SODIUM CHLORIDE 0.9% FLUSH: 3.0000 mL | INTRAVENOUS | Status: DC | PRN

## 2023-12-01 MED ORDER — MIDAZOLAM HCL 2 MG/ML PO SYRP: 8.0000 mg | ORAL_SOLUTION | Freq: Once | ORAL | Status: DC | PRN

## 2023-12-01 MED ORDER — SODIUM CHLORIDE 0.9 % IV SOLN: 250.0000 mL | INTRAVENOUS | Status: DC | PRN

## 2023-12-01 MED ORDER — FAMOTIDINE 20 MG PO TABS: 40.0000 mg | ORAL_TABLET | Freq: Once | ORAL | Status: DC | PRN

## 2023-12-01 MED ORDER — FENTANYL CITRATE (PF) 100 MCG/2ML IJ SOLN
INTRAMUSCULAR | Status: AC
  Filled 2023-12-01: qty 2

## 2023-12-01 MED ORDER — HYDRALAZINE HCL 20 MG/ML IJ SOLN: 5.0000 mg | INTRAMUSCULAR | Status: DC | PRN

## 2023-12-01 MED ORDER — ACETAMINOPHEN 325 MG PO TABS
650.0000 mg | ORAL_TABLET | ORAL | Status: DC | PRN
  Administered 2023-12-01: 650 mg via ORAL

## 2023-12-01 MED ORDER — IODIXANOL 320 MG/ML IV SOLN
INTRAVENOUS | Status: DC | PRN
  Administered 2023-12-01: 65 mL via INTRA_ARTERIAL

## 2023-12-01 MED ORDER — HEPARIN SODIUM (PORCINE) 1000 UNIT/ML IJ SOLN
INTRAMUSCULAR | Status: DC | PRN
  Administered 2023-12-01: 5000 [IU] via INTRAVENOUS

## 2023-12-01 MED ORDER — CEFAZOLIN SODIUM-DEXTROSE 2-4 GM/100ML-% IV SOLN
2.0000 g | INTRAVENOUS | Status: AC
  Administered 2023-12-01: 2 g via INTRAVENOUS

## 2023-12-01 MED ORDER — FENTANYL CITRATE (PF) 100 MCG/2ML IJ SOLN
INTRAMUSCULAR | Status: DC | PRN
  Administered 2023-12-01: 12.5 ug via INTRAVENOUS
  Administered 2023-12-01: 50 ug via INTRAVENOUS
  Administered 2023-12-01: 12.5 ug via INTRAVENOUS

## 2023-12-01 MED ORDER — LIDOCAINE-EPINEPHRINE (PF) 1 %-1:200000 IJ SOLN
INTRAMUSCULAR | Status: DC | PRN
  Administered 2023-12-01: 10 mL

## 2023-12-01 MED ORDER — ACETAMINOPHEN 325 MG PO TABS
ORAL_TABLET | ORAL | Status: AC
  Filled 2023-12-01: qty 2

## 2023-12-01 MED ORDER — LABETALOL HCL 5 MG/ML IV SOLN: 10.0000 mg | INTRAVENOUS | Status: DC | PRN

## 2023-12-01 MED ORDER — METHYLPREDNISOLONE SODIUM SUCC 125 MG IJ SOLR: 125.0000 mg | Freq: Once | INTRAMUSCULAR | Status: DC | PRN

## 2023-12-01 MED ORDER — HEPARIN (PORCINE) IN NACL 2000-0.9 UNIT/L-% IV SOLN
INTRAVENOUS | Status: DC | PRN
  Administered 2023-12-01: 1000 mL

## 2023-12-01 SURGICAL SUPPLY — 19 items
BALLOON LUTONIX 018 5X220X130 (BALLOONS) IMPLANT
BALLOON LUTONIX 018 5X300X130 (BALLOONS) IMPLANT
BALLOON LUTONIX 018 5X80X130 (BALLOONS) IMPLANT
BALLOON ULTRVRSE 3X80X150 OTW (BALLOONS) IMPLANT
CATH ANGIO 5F PIGTAIL 65CM (CATHETERS) IMPLANT
CATH BEACON 5 .038 100 VERT TP (CATHETERS) IMPLANT
CATH ROTAREX 135 6FR (CATHETERS) IMPLANT
COVER PROBE ULTRASOUND 5X96 (MISCELLANEOUS) IMPLANT
DEVICE PRESTO INFLATION (MISCELLANEOUS) IMPLANT
DEVICE STARCLOSE SE CLOSURE (Vascular Products) IMPLANT
GLIDEWIRE ADV .035X260CM (WIRE) IMPLANT
PACK ANGIOGRAPHY (CUSTOM PROCEDURE TRAY) ×1 IMPLANT
SHEATH ANL2 6FRX45 HC (SHEATH) IMPLANT
SHEATH BRITE TIP 5FRX11 (SHEATH) IMPLANT
STENT VIABAHN 6X250X120 (Permanent Stent) IMPLANT
SYR MEDRAD MARK 7 150ML (SYRINGE) IMPLANT
TUBING CONTRAST HIGH PRESS 72 (TUBING) IMPLANT
WIRE G V18X300CM (WIRE) IMPLANT
WIRE J 3MM .035X145CM (WIRE) IMPLANT

## 2023-12-01 NOTE — Op Note (Signed)
 [facility]  Percutaneous Study/Intervention Procedural Note   Date of Surgery: 12/01/2023  Surgeon(s):Ihsan Nomura    Assistants:none  Pre-operative Diagnosis: PAD with rest pain left lower extremity  Post-operative diagnosis:  Same  Procedure(s) Performed:             1.  Ultrasound guidance for vascular access right femoral artery             2.  Catheter placement into left common femoral artery from right femoral approach             3.  Aortogram and selective left lower extremity angiogram             4.  Percutaneous transluminal angioplasty of left peroneal artery and tibioperoneal trunk with 3 mm diameter angioplasty balloon             5.  Atherectomy to the left SFA and popliteal artery with the [place] Rex device  6.  Angioplasty of the left SFA and popliteal artery with a 5 mm diameter tonics drug-coated angioplasty balloon  7.  Stent placement to the left SFA with 6 mm diameter by 25 cm length Viabahn stent            8.  StarClose closure device right femoral artery  EBL: 25 cc  Contrast: 65 cc  Fluoro Time: 6.5 minutes  Moderate Conscious Sedation Time: approximately 33 minutes using 3 mg of Versed  and 75 mcg of Fentanyl               Indications:  Patient is a 73 y.o.female with a history of peripheral arterial disease now with rest pain in the left leg.  She has previously undergone intervention for ulceration on the right leg with good results. The patient has noninvasive study showing reduced perfusion in the left lower extremity. The patient is brought in for angiography for further evaluation and potential treatment.  Due to the limb threatening nature of the situation, angiogram was performed for attempted limb salvage. The patient is aware that if the procedure fails, amputation would be expected.  The patient also understands that even with successful revascularization, amputation may still be required due to the severity of the situation.   Risks and benefits are discussed and informed consent is obtained.   Procedure:  The patient was identified and appropriate procedural time out was performed.  The patient was then placed supine on the table and prepped and draped in the usual sterile fashion. Moderate conscious sedation was administered during a face to face encounter with the patient throughout the procedure with my supervision of the RN administering medicines and monitoring the patient's vital signs, pulse oximetry, telemetry and mental status throughout from the start of the procedure until the patient was taken to the recovery room. Ultrasound was used to evaluate the right common femoral artery.  It was patent .  A digital ultrasound image was acquired.  A Seldinger needle was used to access the right common femoral artery under direct ultrasound guidance and a permanent image was performed.  A 0.035 J wire was advanced without resistance and a 5Fr sheath was placed.  Pigtail catheter was placed into the aorta and an AP aortogram was performed. This demonstrated normal renal arteries and normal aorta and iliac segments without significant stenosis. I then crossed the aortic bifurcation and advanced to the left femoral head. Selective left lower extremity angiogram was then performed. This demonstrated high-grade stenosis in the proximal to mid left  SFA in the 90% range.  There was then diffuse moderate disease throughout the remainder of the SFA in the 60 to 70% range in multiple segments.  The above-knee popliteal artery also had about an 80 to 85% stenosis.  The below-knee popliteal artery normalized at the tibioperoneal trunk and peroneal artery had 3 areas within about an 8 to 10 cm range of moderate disease all between the 60 and 80% range.  The peroneal artery was the only runoff distally with chronic occlusion of the posterior tibial and anterior tibial artery. It was felt that it was in the patient's best interest to proceed with  intervention after these images to avoid a second procedure and a larger amount of contrast and fluoroscopy based off of the findings from the initial angiogram. The patient was systemically heparinized and a 6 [place] Ansell sheath was then placed over the [employer] wire. I then used a Kumpe catheter and the advantage wire to easily navigate through the SFA and popliteal lesions and then exchanged for a V18 wire which crossed the tibioperoneal trunk and peroneal stenosis and parked the wire distally.  The [place] atherectomy device was brought onto the field and 2 passes were made in the left SFA and above-knee popliteal arteries to perform atherectomy for the multiple areas of disease.  Angioplasty balloons were then brought onto the field with a 3 mm diameter by 8 cm length angioplasty balloon for the peroneal artery and tibioperoneal trunk with 2 inflations of 8 to 10 atm in the tibioperoneal trunk and proximal peroneal artery.  I then used a 5 mm diameter by 30 cm length and a 5 mm diameter by 8 cm length Lutonix drug-coated angioplasty balloon to treat from the proximal SFA down to the mid popliteal artery to encompass the multiple lesions.  These inflations were both 10 atm for 1 minute.  Completion imaging showed less than 30% residual stenosis in the tibioperoneal trunk and peroneal lesions after angioplasty.  The above-knee popliteal artery had about a 20 to 25% residual stenosis.  There was greater than 50% residual stenosis in the proximal to mid SFA and mid SFA lesions after angioplasty so I elected to place a stent in these locations.  A 6 mm diameter by 25 cm length Viabahn stent was selected and deployed in the left SFA and postdilated with a 5 mm balloon with excellent angiograph completion result and less than 10% residual stenosis in these locations after stent placement. I elected to terminate the procedure. The sheath was removed and StarClose closure device was deployed in the right  femoral artery with excellent hemostatic result. The patient was taken to the recovery room in stable condition having tolerated the procedure well.  Findings:               Aortogram:  This demonstrated normal renal arteries and normal aorta and iliac segments without significant stenosis.             Left Lower Extremity:  This demonstrated high-grade stenosis in the proximal to mid left SFA in the 90% range.  There was then diffuse moderate disease throughout the remainder of the SFA in the 60 to 70% range in multiple segments.  The above-knee popliteal artery also had about an 80 to 85% stenosis.  The below-knee popliteal artery normalized at the tibioperoneal trunk and peroneal artery had 3 areas within about an 8 to 10 cm range of moderate disease all between the 60 and 80% range.  The peroneal artery was the only runoff distally with chronic occlusion of the posterior tibial and anterior tibial artery   Disposition: Patient was taken to the recovery room in stable condition having tolerated the procedure well.  Complications: None  Selinda Gu 12/01/2023 8:55 AM   This note was created with Dragon Medical transcription system. Any errors in dictation are purely unintentional.

## 2023-12-02 ENCOUNTER — Emergency Department
Admission: EM | Admit: 2023-12-02 | Discharge: 2023-12-02 | Disposition: A | Discharge status: Home / Self Care | Attending: Emergency Medicine | Admitting: Emergency Medicine

## 2023-12-02 ENCOUNTER — Encounter: Payer: Self-pay | Admitting: Vascular Surgery

## 2023-12-02 ENCOUNTER — Other Ambulatory Visit: Payer: Self-pay

## 2023-12-02 ENCOUNTER — Emergency Department

## 2023-12-02 DIAGNOSIS — M79605 Pain in left leg: Secondary | ICD-10-CM | POA: Diagnosis present

## 2023-12-02 DIAGNOSIS — R1084 Generalized abdominal pain: Secondary | ICD-10-CM | POA: Insufficient documentation

## 2023-12-02 LAB — COMPREHENSIVE METABOLIC PANEL WITH GFR
ALT: 9 U/L (ref 0–44)
AST: 16 U/L (ref 15–41)
Albumin: 4.2 g/dL (ref 3.5–5.0)
Alkaline Phosphatase: 78 U/L (ref 38–126)
Anion gap: 11 (ref 5–15)
BUN: 11 mg/dL (ref 8–23)
CO2: 25 mmol/L (ref 22–32)
Calcium: 9 mg/dL (ref 8.9–10.3)
Chloride: 104 mmol/L (ref 98–111)
Creatinine, Ser: 0.79 mg/dL (ref 0.44–1.00)
GFR, Estimated: >60 mL/min (ref >60)
Glucose, Bld: 114 mg/dL — ABNORMAL HIGH (ref 70–99)
Potassium: 3.7 mmol/L (ref 3.5–5.1)
Sodium: 140 mmol/L (ref 135–145)
Total Bilirubin: 1.3 mg/dL — ABNORMAL HIGH (ref 0.0–1.2)
Total Protein: 7.6 g/dL (ref 6.5–8.1)

## 2023-12-02 LAB — CBC WITH DIFFERENTIAL/PLATELET
Abs Immature Granulocytes: 0.04 K/uL (ref 0.00–0.07)
Basophils Absolute: 0 K/uL (ref 0.0–0.1)
Basophils Relative: 0 %
Eosinophils Absolute: 0 K/uL (ref 0.0–0.5)
Eosinophils Relative: 0 %
HCT: 38.1 % (ref 36.0–46.0)
Hemoglobin: 11.8 g/dL — ABNORMAL LOW (ref 12.0–15.0)
Immature Granulocytes: 0 %
Lymphocytes Relative: 10 %
Lymphs Abs: 1.1 K/uL (ref 0.7–4.0)
MCH: 22.8 pg — ABNORMAL LOW (ref 26.0–34.0)
MCHC: 31 g/dL (ref 30.0–36.0)
MCV: 73.7 fL — ABNORMAL LOW (ref 80.0–100.0)
Monocytes Absolute: 1 K/uL (ref 0.1–1.0)
Monocytes Relative: 10 %
Neutro Abs: 8.1 K/uL — ABNORMAL HIGH (ref 1.7–7.7)
Neutrophils Relative %: 80 %
Platelets: 241 K/uL (ref 150–400)
RBC: 5.17 MIL/uL — ABNORMAL HIGH (ref 3.87–5.11)
RDW: 16.4 % — ABNORMAL HIGH (ref 11.5–15.5)
WBC: 10.2 K/uL (ref 4.0–10.5)
nRBC: 0 % (ref 0.0–0.2)

## 2023-12-02 LAB — TROPONIN I (HIGH SENSITIVITY): Troponin I (High Sensitivity): 8 ng/L (ref <18)

## 2023-12-02 LAB — URINALYSIS, ROUTINE W REFLEX MICROSCOPIC
Bacteria, UA: NONE SEEN
Bilirubin Urine: NEGATIVE
Glucose, UA: NEGATIVE mg/dL
Hgb urine dipstick: NEGATIVE
Ketones, ur: NEGATIVE mg/dL
Nitrite: NEGATIVE
Protein, ur: NEGATIVE mg/dL
Specific Gravity, Urine: 1.016 (ref 1.005–1.030)
pH: 6 (ref 5.0–8.0)

## 2023-12-02 MED ORDER — ONDANSETRON HCL 4 MG/2ML IJ SOLN
4.0000 mg | Freq: Once | INTRAMUSCULAR | Status: AC
  Administered 2023-12-02: 4 mg via INTRAVENOUS
  Filled 2023-12-02: qty 2

## 2023-12-02 MED ORDER — FENTANYL CITRATE PF 50 MCG/ML IJ SOSY
50.0000 ug | PREFILLED_SYRINGE | Freq: Once | INTRAMUSCULAR | Status: AC
  Administered 2023-12-02: 50 ug via INTRAVENOUS
  Filled 2023-12-02: qty 1

## 2023-12-02 MED ORDER — SODIUM CHLORIDE 0.9 % IV BOLUS
1000.0000 mL | Freq: Once | INTRAVENOUS | Status: AC
Start: 2023-12-02 — End: 2023-12-02
  Administered 2023-12-02: 1000 mL via INTRAVENOUS

## 2023-12-02 MED ORDER — IOHEXOL 350 MG/ML SOLN
125.0000 mL | Freq: Once | INTRAVENOUS | Status: AC | PRN
  Administered 2023-12-02: 125 mL via INTRAVENOUS

## 2023-12-02 MED ORDER — DICYCLOMINE HCL 10 MG PO CAPS: 10.0000 mg | ORAL_CAPSULE | Freq: Three times a day (TID) | ORAL | 0 refills | Status: AC | PRN

## 2023-12-02 NOTE — ED Notes (Signed)
 Pt c/o nausea. MD Floy notified. Ice pack provided.

## 2023-12-02 NOTE — ED Notes (Signed)
 ED Provider at bedside.

## 2023-12-02 NOTE — ED Notes (Signed)
 Pt family up to nurses desk stating pt does not feel good and feels like may pass out. Pt flushed and clammy. Bp 83/35, pt roomed

## 2023-12-02 NOTE — ED Triage Notes (Signed)
 Patient states she had an angiogram performed here yesterday to left leg; complaining of pain and swelling to left upper leg.

## 2023-12-02 NOTE — ED Provider Notes (Signed)
 [facility] Provider Note    Event Date/Time   First MD Initiated Contact with Patient 12/02/23 1507     (approximate)   History   Leg Pain   HPI  [name] is a 73 y.o. female who presented to the emergency department today because of concerns for left leg pain that started last night.  Patient did undergo left leg angio yesterday with vascular surgery.  Did have stent placed.  She states around 2:00 in the morning that she started having pain.  Once here in the emergency department however she started developing severe abdominal pain and nausea.  Felt lightheaded.  Felt like she was going to pass out.  Denied any chest pain or shortness of breath.  Denies any fevers or chills.     Physical Exam   Triage Vital Signs: ED Triage Vitals  Encounter Vitals Group     BP 12/02/23 1447 (!) 143/89     Girls Systolic BP Percentile --      Girls Diastolic BP Percentile --      Boys Systolic BP Percentile --      Boys Diastolic BP Percentile --      Pulse Rate 12/02/23 1447 65     Resp 12/02/23 1447 18     Temp 12/02/23 1447 98.3 F (36.8 C)     Temp Source 12/02/23 1447 Oral     SpO2 12/02/23 1447 98 %     Weight 12/02/23 1446 163 lb (73.9 kg)     Height 12/02/23 1446 5' 5 (1.651 m)     Head Circumference --      Peak Flow --      Pain Score 12/02/23 1446 10     Pain Loc --      Pain Education --      Exclude from Growth Chart --     Most recent vital signs: Vitals:   12/02/23 1447 12/02/23 1504  BP: (!) 143/89 (!) 107/59  Pulse: 65 (!) 56  Resp: 18 (!) 24  Temp: 98.3 F (36.8 C)   SpO2: 98% 100%   General: Awake, alert, oriented. CV:  Good peripheral perfusion. Regular rate and rhythm. Resp:  Normal effort. Lungs clear. Abd:  No distention. Tender to palpation in upper abdomen. Other:  Left leg warm, DP faintly palpable.    ED Results / Procedures / Treatments   Labs (all labs ordered are listed, but only abnormal results are  displayed) Labs Reviewed  CBC WITH DIFFERENTIAL/PLATELET - Abnormal; Notable for the following components:      Result Value   RBC 5.17 (*)    Hemoglobin 11.8 (*)    MCV 73.7 (*)    MCH 22.8 (*)    RDW 16.4 (*)    Neutro Abs 8.1 (*)    All other components within normal limits  COMPREHENSIVE METABOLIC PANEL WITH GFR     EKG  I, Guadalupe Eagles, attending physician, personally viewed and interpreted this EKG  EKG Time: 1536 Rate: 54 Rhythm: sinus rhythm Axis: narrow Intervals: qtc 429 QRS: narrow, LVH ST changes: no st elevation Impression: abnormal ekg    RADIOLOGY I independently interpreted and visualized the CT angio, abd/pel/runoff. My interpretation: Appears to have okay arterial flow in left leg Radiology interpretation:  IMPRESSION:  VASCULAR    Aortoiliac atherosclerosis.  No evidence of significant stenosis.    Moderate narrowing at the origin of the celiac artery. Remainder the  mesenteric vessels are widely patent.  Small amount of soft tissue around the right common femoral artery  and proximal SFA may reflect small hematoma related to recent  catheterization.    Superficial femoral arteries patent bilaterally with stents in  place. Popliteal arteries are disease bilaterally but patent.  Disease bilateral trifurcation vessels with proximal occlusions of  both posterior and anterior tibial arteries and dominant runoff via  the peroneal artery. Vessels appear to reconstitute distally.    NON-VASCULAR    Questionable wall thickening in the distal stomach. This could be  related to gastritis or nondistention.    Hepatic steatosis.      PROCEDURES:  Critical Care performed: No    MEDICATIONS ORDERED IN ED: Medications - No data to display   IMPRESSION / MDM / ASSESSMENT AND PLAN / ED COURSE  I reviewed the triage vital signs and the nursing notes.                              Differential diagnosis includes, but is not limited to,  acute arterial occlusion of the left lower extremity, mesenteric ischemia, SBO  Patient's presentation is most consistent with acute presentation with potential threat to life or bodily function.   The patient is on the cardiac monitor to evaluate for evidence of arrhythmia and/or significant heart rate changes.  Patient presented to the emergency department because of concerns for left leg pain.  While here in the waiting room she started developing significant abdominal pain and lightheadedness.  Patient was hypotensive on recheck when she was feeling the symptoms.  The time my exam she is complaining primarily of the abdominal pain.  She is tender in the upper abdomen.  Will check CT angio to evaluate the stomach, given history of arterial disease would have concern for mesenteric ischemia, as well as runoff to assess the left lower leg.  Patient did feel significant improvement after medication here.  CT angio without any concerning acute abnormalities.  It does look like the stents are patent in the left leg.  Given patient's improvement and CT findings I think is reasonable for patient be discharged.  Will give patient prescription for Bentyl  for abdominal pain.  Did encourage patient to follow-up with vascular.      FINAL CLINICAL IMPRESSION(S) / ED DIAGNOSES   Final diagnoses:  Left leg pain  Generalized abdominal pain     Note:  This document was prepared using Dragon voice recognition software and may include unintentional dictation errors.    Floy Roberts, MD 12/02/23 [phone]

## 2023-12-02 NOTE — ED Notes (Signed)
 Patient vomited x1. Pt verbalized feeling much better after vomiting.

## 2023-12-02 NOTE — ED Notes (Signed)
 Patient back from CT. Monitor applied.

## 2023-12-02 NOTE — ED Notes (Signed)
 Patient up to toilet and back to bed. Monitor applied.

## 2023-12-06 ENCOUNTER — Emergency Department

## 2023-12-06 ENCOUNTER — Emergency Department
Admission: EM | Admit: 2023-12-06 | Discharge: 2023-12-06 | Disposition: A | Discharge status: Home / Self Care | Attending: Emergency Medicine | Admitting: Emergency Medicine

## 2023-12-06 ENCOUNTER — Other Ambulatory Visit: Payer: Self-pay

## 2023-12-06 DIAGNOSIS — N200 Calculus of kidney: Secondary | ICD-10-CM | POA: Diagnosis not present

## 2023-12-06 DIAGNOSIS — K7460 Unspecified cirrhosis of liver: Secondary | ICD-10-CM | POA: Diagnosis not present

## 2023-12-06 DIAGNOSIS — R911 Solitary pulmonary nodule: Secondary | ICD-10-CM | POA: Diagnosis not present

## 2023-12-06 DIAGNOSIS — M542 Cervicalgia: Secondary | ICD-10-CM | POA: Insufficient documentation

## 2023-12-06 DIAGNOSIS — I3139 Other pericardial effusion (noninflammatory): Secondary | ICD-10-CM | POA: Insufficient documentation

## 2023-12-06 DIAGNOSIS — J9811 Atelectasis: Secondary | ICD-10-CM | POA: Diagnosis not present

## 2023-12-06 DIAGNOSIS — R0789 Other chest pain: Secondary | ICD-10-CM | POA: Diagnosis present

## 2023-12-06 DIAGNOSIS — I4892 Unspecified atrial flutter: Secondary | ICD-10-CM | POA: Insufficient documentation

## 2023-12-06 DIAGNOSIS — Y92410 Unspecified street and highway as the place of occurrence of the external cause: Secondary | ICD-10-CM | POA: Diagnosis not present

## 2023-12-06 DIAGNOSIS — S0990XA Unspecified injury of head, initial encounter: Secondary | ICD-10-CM | POA: Diagnosis present

## 2023-12-06 DIAGNOSIS — I517 Cardiomegaly: Secondary | ICD-10-CM | POA: Diagnosis not present

## 2023-12-06 DIAGNOSIS — I700 Atherosclerosis of aorta: Secondary | ICD-10-CM | POA: Diagnosis not present

## 2023-12-06 DIAGNOSIS — Z7901 Long term (current) use of anticoagulants: Secondary | ICD-10-CM | POA: Insufficient documentation

## 2023-12-06 MED ORDER — OXYCODONE-ACETAMINOPHEN 5-325 MG PO TABS
1.0000 | ORAL_TABLET | Freq: Once | ORAL | Status: AC
  Administered 2023-12-06: 1 via ORAL
  Filled 2023-12-06: qty 1

## 2023-12-06 MED ORDER — OXYCODONE-ACETAMINOPHEN 5-325 MG PO TABS: 1.0000 | ORAL_TABLET | ORAL | 0 refills | Status: DC | PRN
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
## 2023-12-06 NOTE — Discharge Instructions (Addendum)
 As we discussed please follow-up with your primary care doctor to obtain a repeat chest CT in 6 to 12 months given your 6 mm right lung nodule.  You have been prescribed a pain medication.  Please only take this medication as written.  Do not drink alcohol or drive while taking this medication.  Return to the emergency department for any symptom concerning to yourself otherwise follow-up with your doctor.

## 2023-12-07 ENCOUNTER — Other Ambulatory Visit: Payer: Self-pay | Admitting: Cardiology

## 2023-12-07 DIAGNOSIS — I480 Paroxysmal atrial fibrillation: Secondary | ICD-10-CM

## 2023-12-08 NOTE — Telephone Encounter (Signed)
 Prescription refill request for Eliquis  received. Indication: a fib Last office visit: 11/03/23 Scr: 0.79 12/02/23 epic Age: 73 Weight: 73kg

## 2023-12-16 ENCOUNTER — Ambulatory Visit
Admission: RE | Admit: 2023-12-16 | Discharge: 2023-12-16 | Disposition: A | Discharge status: Home / Self Care | Attending: Urology | Admitting: Urology

## 2023-12-16 ENCOUNTER — Ambulatory Visit
Admission: RE | Admit: 2023-12-16 | Discharge: 2023-12-16 | Disposition: A | Discharge status: Home / Self Care | Source: Ambulatory Visit | Attending: Urology | Admitting: Urology

## 2023-12-16 DIAGNOSIS — N200 Calculus of kidney: Secondary | ICD-10-CM | POA: Insufficient documentation

## 2023-12-20 ENCOUNTER — Other Ambulatory Visit: Payer: Self-pay | Admitting: Family Medicine

## 2023-12-20 ENCOUNTER — Ambulatory Visit
Admission: RE | Admit: 2023-12-20 | Discharge: 2023-12-20 | Disposition: A | Discharge status: Home / Self Care | Source: Ambulatory Visit | Attending: Family Medicine | Admitting: Family Medicine

## 2023-12-20 DIAGNOSIS — M79644 Pain in right finger(s): Secondary | ICD-10-CM

## 2023-12-28 ENCOUNTER — Ambulatory Visit: Attending: Family Medicine | Admitting: Physical Therapy

## 2023-12-28 DIAGNOSIS — R002 Palpitations: Secondary | ICD-10-CM | POA: Diagnosis not present

## 2023-12-28 DIAGNOSIS — M546 Pain in thoracic spine: Secondary | ICD-10-CM | POA: Insufficient documentation

## 2023-12-28 DIAGNOSIS — M542 Cervicalgia: Secondary | ICD-10-CM | POA: Insufficient documentation

## 2023-12-28 DIAGNOSIS — I4892 Unspecified atrial flutter: Secondary | ICD-10-CM | POA: Diagnosis not present

## 2023-12-28 DIAGNOSIS — M25512 Pain in left shoulder: Secondary | ICD-10-CM | POA: Insufficient documentation
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
## 2023-12-30 ENCOUNTER — Emergency Department

## 2023-12-30 ENCOUNTER — Other Ambulatory Visit: Payer: Self-pay

## 2023-12-30 ENCOUNTER — Encounter: Payer: Self-pay | Admitting: Emergency Medicine

## 2023-12-30 ENCOUNTER — Inpatient Hospital Stay
Admission: EM | Admit: 2023-12-30 | Discharge: 2024-01-02 | DRG: 310 | Disposition: A | Discharge status: Home / Self Care | Attending: Student | Admitting: Student

## 2023-12-30 DIAGNOSIS — Z79890 Hormone replacement therapy (postmenopausal): Secondary | ICD-10-CM | POA: Diagnosis not present

## 2023-12-30 DIAGNOSIS — F17200 Nicotine dependence, unspecified, uncomplicated: Secondary | ICD-10-CM | POA: Diagnosis present

## 2023-12-30 DIAGNOSIS — I4891 Unspecified atrial fibrillation: Secondary | ICD-10-CM | POA: Diagnosis not present

## 2023-12-30 DIAGNOSIS — Z90710 Acquired absence of both cervix and uterus: Secondary | ICD-10-CM

## 2023-12-30 DIAGNOSIS — I447 Left bundle-branch block, unspecified: Secondary | ICD-10-CM | POA: Diagnosis present

## 2023-12-30 DIAGNOSIS — Z955 Presence of coronary angioplasty implant and graft: Secondary | ICD-10-CM | POA: Diagnosis not present

## 2023-12-30 DIAGNOSIS — I483 Typical atrial flutter: Secondary | ICD-10-CM | POA: Diagnosis not present

## 2023-12-30 DIAGNOSIS — Z7982 Long term (current) use of aspirin: Secondary | ICD-10-CM | POA: Diagnosis not present

## 2023-12-30 DIAGNOSIS — M1990 Unspecified osteoarthritis, unspecified site: Secondary | ICD-10-CM | POA: Diagnosis present

## 2023-12-30 DIAGNOSIS — E038 Other specified hypothyroidism: Secondary | ICD-10-CM | POA: Diagnosis present

## 2023-12-30 DIAGNOSIS — I4892 Unspecified atrial flutter: Secondary | ICD-10-CM | POA: Diagnosis present

## 2023-12-30 DIAGNOSIS — Z79899 Other long term (current) drug therapy: Secondary | ICD-10-CM | POA: Diagnosis not present

## 2023-12-30 DIAGNOSIS — Z8679 Personal history of other diseases of the circulatory system: Secondary | ICD-10-CM

## 2023-12-30 DIAGNOSIS — Z87891 Personal history of nicotine dependence: Secondary | ICD-10-CM

## 2023-12-30 DIAGNOSIS — I25118 Atherosclerotic heart disease of native coronary artery with other forms of angina pectoris: Secondary | ICD-10-CM | POA: Diagnosis present

## 2023-12-30 DIAGNOSIS — E039 Hypothyroidism, unspecified: Secondary | ICD-10-CM | POA: Diagnosis present

## 2023-12-30 DIAGNOSIS — E785 Hyperlipidemia, unspecified: Secondary | ICD-10-CM | POA: Diagnosis present

## 2023-12-30 DIAGNOSIS — Z833 Family history of diabetes mellitus: Secondary | ICD-10-CM | POA: Diagnosis not present

## 2023-12-30 DIAGNOSIS — E559 Vitamin D deficiency, unspecified: Secondary | ICD-10-CM | POA: Diagnosis present

## 2023-12-30 DIAGNOSIS — R002 Palpitations: Secondary | ICD-10-CM | POA: Diagnosis present

## 2023-12-30 DIAGNOSIS — I739 Peripheral vascular disease, unspecified: Secondary | ICD-10-CM | POA: Diagnosis present

## 2023-12-30 DIAGNOSIS — Z791 Long term (current) use of non-steroidal anti-inflammatories (NSAID): Secondary | ICD-10-CM

## 2023-12-30 DIAGNOSIS — K219 Gastro-esophageal reflux disease without esophagitis: Secondary | ICD-10-CM | POA: Diagnosis present

## 2023-12-30 DIAGNOSIS — D649 Anemia, unspecified: Secondary | ICD-10-CM | POA: Diagnosis present

## 2023-12-30 DIAGNOSIS — Z8249 Family history of ischemic heart disease and other diseases of the circulatory system: Secondary | ICD-10-CM | POA: Diagnosis not present

## 2023-12-30 DIAGNOSIS — I2510 Atherosclerotic heart disease of native coronary artery without angina pectoris: Secondary | ICD-10-CM | POA: Diagnosis not present

## 2023-12-30 DIAGNOSIS — I4430 Unspecified atrioventricular block: Secondary | ICD-10-CM | POA: Diagnosis present

## 2023-12-30 DIAGNOSIS — I480 Paroxysmal atrial fibrillation: Secondary | ICD-10-CM | POA: Diagnosis present

## 2023-12-30 DIAGNOSIS — Z91040 Latex allergy status: Secondary | ICD-10-CM

## 2023-12-30 DIAGNOSIS — Z7951 Long term (current) use of inhaled steroids: Secondary | ICD-10-CM

## 2023-12-30 DIAGNOSIS — Z7901 Long term (current) use of anticoagulants: Secondary | ICD-10-CM | POA: Diagnosis not present

## 2023-12-30 DIAGNOSIS — E782 Mixed hyperlipidemia: Secondary | ICD-10-CM | POA: Diagnosis not present

## 2023-12-30 DIAGNOSIS — I10 Essential (primary) hypertension: Secondary | ICD-10-CM | POA: Diagnosis present

## 2023-12-30 DIAGNOSIS — Z96641 Presence of right artificial hip joint: Secondary | ICD-10-CM | POA: Diagnosis present

## 2023-12-30 DIAGNOSIS — E876 Hypokalemia: Secondary | ICD-10-CM | POA: Diagnosis present

## 2023-12-30 DIAGNOSIS — Z880 Allergy status to penicillin: Secondary | ICD-10-CM

## 2023-12-30 DIAGNOSIS — Z9889 Other specified postprocedural states: Secondary | ICD-10-CM

## 2023-12-30 DIAGNOSIS — I340 Nonrheumatic mitral (valve) insufficiency: Secondary | ICD-10-CM | POA: Diagnosis present

## 2023-12-30 DIAGNOSIS — I484 Atypical atrial flutter: Secondary | ICD-10-CM | POA: Diagnosis not present

## 2023-12-30 DIAGNOSIS — Z87442 Personal history of urinary calculi: Secondary | ICD-10-CM

## 2023-12-30 DIAGNOSIS — Z888 Allergy status to other drugs, medicaments and biological substances status: Secondary | ICD-10-CM | POA: Diagnosis not present

## 2023-12-30 DIAGNOSIS — K760 Fatty (change of) liver, not elsewhere classified: Secondary | ICD-10-CM | POA: Diagnosis present

## 2023-12-30 LAB — BASIC METABOLIC PANEL WITH GFR
Anion gap: 15 (ref 5–15)
BUN: 16 mg/dL (ref 8–23)
CO2: 24 mmol/L (ref 22–32)
Calcium: 9.5 mg/dL (ref 8.9–10.3)
Chloride: 103 mmol/L (ref 98–111)
Creatinine, Ser: 0.76 mg/dL (ref 0.44–1.00)
GFR, Estimated: >60 mL/min (ref >60)
Glucose, Bld: 171 mg/dL — ABNORMAL HIGH (ref 70–99)
Potassium: 3.4 mmol/L — ABNORMAL LOW (ref 3.5–5.1)
Sodium: 142 mmol/L (ref 135–145)

## 2023-12-30 LAB — CBC
HCT: 41.9 % (ref 36.0–46.0)
Hemoglobin: 12.5 g/dL (ref 12.0–15.0)
MCH: 22.2 pg — ABNORMAL LOW (ref 26.0–34.0)
MCHC: 29.8 g/dL — ABNORMAL LOW (ref 30.0–36.0)
MCV: 74.3 fL — ABNORMAL LOW (ref 80.0–100.0)
Platelets: 288 K/uL (ref 150–400)
RBC: 5.64 MIL/uL — ABNORMAL HIGH (ref 3.87–5.11)
RDW: 15.8 % — ABNORMAL HIGH (ref 11.5–15.5)
WBC: 6.5 K/uL (ref 4.0–10.5)
nRBC: 0 % (ref 0.0–0.2)

## 2023-12-30 LAB — TSH: TSH: 0.252 u[IU]/mL — ABNORMAL LOW (ref 0.350–4.500)

## 2023-12-30 LAB — MAGNESIUM: Magnesium: 2.2 mg/dL (ref 1.7–2.4)

## 2023-12-30 LAB — TROPONIN I (HIGH SENSITIVITY)
Troponin I (High Sensitivity): 8 ng/L (ref <18)
Troponin I (High Sensitivity): 9 ng/L (ref <18)

## 2023-12-30 MED ORDER — PANTOPRAZOLE SODIUM 40 MG PO TBEC
40.0000 mg | DELAYED_RELEASE_TABLET | Freq: Every day | ORAL | Status: DC
  Administered 2023-12-31 – 2024-01-02 (×3): 40 mg via ORAL
  Filled 2023-12-30 (×3): qty 1

## 2023-12-30 MED ORDER — APIXABAN 5 MG PO TABS
5.0000 mg | ORAL_TABLET | Freq: Two times a day (BID) | ORAL | Status: DC
  Administered 2023-12-30 – 2024-01-02 (×6): 5 mg via ORAL
  Filled 2023-12-30 (×6): qty 1

## 2023-12-30 MED ORDER — ATORVASTATIN CALCIUM 10 MG PO TABS
10.0000 mg | ORAL_TABLET | Freq: Every day | ORAL | Status: DC
  Administered 2023-12-30 – 2024-01-01 (×3): 10 mg via ORAL
  Filled 2023-12-30 (×3): qty 1

## 2023-12-30 MED ORDER — ONDANSETRON HCL 4 MG PO TABS: 4.0000 mg | ORAL_TABLET | Freq: Four times a day (QID) | ORAL | Status: DC | PRN

## 2023-12-30 MED ORDER — ASPIRIN 81 MG PO TBEC
81.0000 mg | DELAYED_RELEASE_TABLET | Freq: Every day | ORAL | Status: DC
  Administered 2023-12-31 – 2024-01-02 (×3): 81 mg via ORAL
  Filled 2023-12-30 (×3): qty 1

## 2023-12-30 MED ORDER — ACETAMINOPHEN 650 MG RE SUPP
650.0000 mg | Freq: Four times a day (QID) | RECTAL | Status: DC | PRN
Start: 2023-12-30 — End: 2024-01-02

## 2023-12-30 MED ORDER — LEVOTHYROXINE SODIUM 100 MCG PO TABS
100.0000 ug | ORAL_TABLET | Freq: Every day | ORAL | Status: DC
  Administered 2023-12-31 – 2024-01-01 (×2): 100 ug via ORAL
  Filled 2023-12-30: qty 2
  Filled 2023-12-30: qty 1

## 2023-12-30 MED ORDER — SODIUM CHLORIDE 0.9 % IV BOLUS
1000.0000 mL | Freq: Once | INTRAVENOUS | Status: AC
  Administered 2023-12-30: 1000 mL via INTRAVENOUS

## 2023-12-30 MED ORDER — SENNOSIDES-DOCUSATE SODIUM 8.6-50 MG PO TABS: 1.0000 | ORAL_TABLET | Freq: Every evening | ORAL | Status: DC | PRN

## 2023-12-30 MED ORDER — POTASSIUM CHLORIDE CRYS ER 20 MEQ PO TBCR
40.0000 meq | EXTENDED_RELEASE_TABLET | Freq: Once | ORAL | Status: AC
  Administered 2023-12-30: 40 meq via ORAL
  Filled 2023-12-30: qty 2

## 2023-12-30 MED ORDER — AMLODIPINE BESYLATE 5 MG PO TABS: 10.0000 mg | ORAL_TABLET | Freq: Every day | ORAL | Status: DC

## 2023-12-30 MED ORDER — ONDANSETRON HCL 4 MG/2ML IJ SOLN: 4.0000 mg | Freq: Four times a day (QID) | INTRAMUSCULAR | Status: DC | PRN

## 2023-12-30 MED ORDER — NICOTINE 21 MG/24HR TD PT24: 21.0000 mg | MEDICATED_PATCH | Freq: Every day | TRANSDERMAL | Status: DC | PRN

## 2023-12-30 MED ORDER — ACETAMINOPHEN 325 MG PO TABS
650.0000 mg | ORAL_TABLET | Freq: Four times a day (QID) | ORAL | Status: DC | PRN
  Administered 2023-12-31: 650 mg via ORAL
  Filled 2023-12-30: qty 2

## 2023-12-30 MED ORDER — DILTIAZEM HCL-DEXTROSE 125-5 MG/125ML-% IV SOLN (PREMIX)
5.0000 mg/h | INTRAVENOUS | Status: DC
  Administered 2023-12-30: 5 mg/h via INTRAVENOUS
  Filled 2023-12-30: qty 125

## 2023-12-30 MED ORDER — METOPROLOL TARTRATE 5 MG/5ML IV SOLN
5.0000 mg | INTRAVENOUS | Status: DC | PRN
  Administered 2023-12-31: 5 mg via INTRAVENOUS
  Filled 2023-12-30: qty 5

## 2023-12-30 MED ORDER — FLUTICASONE FUROATE-VILANTEROL 100-25 MCG/ACT IN AEPB
1.0000 | INHALATION_SPRAY | Freq: Every day | RESPIRATORY_TRACT | Status: DC
  Administered 2023-12-31 – 2024-01-02 (×3): 1 via RESPIRATORY_TRACT
  Filled 2023-12-30: qty 28

## 2023-12-30 MED ORDER — ALBUTEROL SULFATE (2.5 MG/3ML) 0.083% IN NEBU: 2.5000 mg | INHALATION_SOLUTION | Freq: Four times a day (QID) | RESPIRATORY_TRACT | Status: AC | PRN

## 2023-12-30 NOTE — Assessment & Plan Note (Signed)
Levothyroxine 

## 2023-12-30 NOTE — H&P (Signed)
 History and Physical   [name] FMW:[id number] DOB: 1950/04/09 DOA: 12/30/2023  PCP: [facility]  Outpatient Specialists: Dr. Marea, vascular Patient coming from: Home via EMS  I have personally briefly reviewed patient's old medical records in [facility] EMR.  Chief Concern: Heart palpitations  HPI: [name] is a 22 female with history of atrial fibrillation on Eliquis , status post multiple ablation, as needed metoprolol  due to heart rate going in the 40s, hypothyroid, GERD, hypertension, hyperlipidemia, atherosclerosis of the native cardiac arteries, history of stent placement, who presents ED for chief concerns of heart palpitation.  Vitals in the ED showed t of 98.2, rr of 18, hr 125, blood pressure 123/79, SpO2 of 100% on room air.  Serum sodium is 142, potassium 3.4, chloride 103, bicarb 24, BUN of 16, serum creatinine 0.76, eGFR greater than 60, nonfasting glucose 171, WBC 6.5, hemoglobin 12.5, platelets of 288.  HS trops 8.  ED treatment: Potassium chloride  40 mill equivalents p.o. one-time dose, diltiazem  gtt. initiated, sodium chloride  1 L bolus. ---------------------------------- At bedside, patient was able to tell me her first and last name, age, location, current calendar year.  She denies trauma to her person.  She reports that around 3 AM this morning, she developed heart palpitations, and she thought her heart rate was in the 180s.  She called EMS and EMS confirmed her heart rate.  She was given diltiazem  10 mg IV one-time dose and her heart rate continue to fluctuate in the 80s and 120s.  Patient is symptomatic, with feelings of acid reflux.  She reports this feels similar to her first episode of atrial flutter that she experienced 6 to 7 years ago.  She denies chest pain, shortness of breath, abdominal pain, dysuria, hematuria, syncope, swelling of her lower extremities, diarrhea, blood in her stool, nausea, vomiting.  She reports at this  time, she has mild palpitation feelings with exhaustion because she has been up since 3 AM this morning.  Social history: He lives at home with her life partner of 22 years.  She is a former tobacco user, quitting tobacco more than 5 years ago.  She infrequently drinks EtOH, last drink was about 2 to 3 weeks ago.  She denies recreational drug use.  She is retired.  ROS: Constitutional: no weight change, no fever ENT/Mouth: no sore throat, no rhinorrhea Eyes: no eye pain, no vision changes Cardiovascular: no chest pain, no dyspnea,  no edema, + palpitations Respiratory: no cough, no sputum, no wheezing Gastrointestinal: no nausea, no vomiting, no diarrhea, no constipation Genitourinary: no urinary incontinence, no dysuria, no hematuria Musculoskeletal: no arthralgias, no myalgias Skin: no skin lesions, no pruritus, Neuro: no weakness, no loss of consciousness, no syncope Psych: no anxiety, no depression, no decrease appetite Heme/Lymph: no bruising, no bleeding  ED Course: Discussed with EDP, patient requiring hospitalization for chief concerns of palpitations secondary to atrial flutter.  Assessment/Plan  Principal Problem:   Atrial flutter (HCC) Active Problems:   PAD (peripheral artery disease)   Essential hypertension   Hypothyroidism, unspecified   Coronary artery disease of native artery of native heart with stable angina pectoris   Paroxysmal atrial fibrillation (HCC)   Hyperlipidemia   Palpitations   Assessment and Plan:  * Atrial flutter (HCC) With palpitations Feeling as needed home therapy, metoprolol  12.5 mg p.o. as prescribed by outpatient cardiology Patient reports her heart rate was in the 180s and was the same when EMS arrived Per ED nursing, EMS gave  patient diltiazem  10 mg IV one-time dose and patient heart rate improved but still remained between 80 and 120s Patient is on diltiazem  gtt. Cardiology, Dr. Argentina, has been consulted via secure chat and Epic  order Admit to telemetry  PAD (peripheral artery disease) Home aspirin  81 mg daily and Eliquis  5 mg p.o. twice daily resumed  Hyperlipidemia Atorvastatin  10 mg nightly resumed  Hypothyroidism, unspecified Levothyroxine  100 mcg  Tobacco use disorder-resolved as of 07/14/2019 As needed nicotine  patch ordered  Chart reviewed.   DVT prophylaxis: Eliquis  5 mg p.o. twice daily Code Status: Full code Diet: Heart healthy diet Family Communication: Updated life partner, Tommy at bedside with patient's permission Disposition Plan: Pending clinical course Consults called: Cardiology Admission status: Telemetry cardiac, inpatient  Past Medical History:  Diagnosis Date   Anemia    Arthritis    Atrial flutter (HCC) 09/2015   a.) CHADS2VASc 2 (sex, HTN); b.) s/p DCCV 11/11/2015 (150 J); c.) s/p RFCA 01/17/2016; d.) rate/rhythm maintained without pharmacological intervention; no chronic anticoagulation.   Coronary artery calcification seen on CT scan    Diastolic dysfunction 08/23/2019   a.)  TTE 08/23/2019: EF 55-60%, severe LAE, mild-mod MR, PASP 46.2, G2DD   Dyspnea    Essential hypertension    GERD (gastroesophageal reflux disease)    Hepatic steatosis    History of kidney stones    Incomplete left bundle branch block (LBBB)    Paroxysmal A-fib (HCC)    Pneumonia    as a child   Secondary hypercoagulable state    Subclinical hypothyroidism    Tobacco abuse    Typical atrial flutter (HCC)    Past Surgical History:  Procedure Laterality Date   ABDOMINAL HYSTERECTOMY     ABLATION OF DYSRHYTHMIC FOCUS  01/16/2016   ATRIAL FIBRILLATION ABLATION N/A 05/24/2023   Procedure: ATRIAL FIBRILLATION ABLATION;  Surgeon: Cindie Ole DASEN, MD;  Location: [facility];  Service: Cardiovascular;  Laterality: N/A;   CYSTOSCOPY WITH STENT PLACEMENT Left 07/05/2023   Procedure: CYSTOSCOPY, WITH STENT INSERTION;  Surgeon: Twylla Glendia BROCKS, MD;  Location: [facility] ORS;  Service: Urology;   Laterality: Left;   CYSTOSCOPY/URETEROSCOPY/HOLMIUM LASER/STENT PLACEMENT Left 07/19/2023   Procedure: CYSTOSCOPY/URETEROSCOPY/HOLMIUM LASER/STENT PLACEMENT;  Surgeon: Twylla Glendia BROCKS, MD;  Location: [facility] ORS;  Service: Urology;  Laterality: Left;  Exchange   ELECTROPHYSIOLOGIC STUDY N/A 11/11/2015   Procedure: CARDIOVERSION;  Surgeon: Evalene JINNY Lunger, MD;  Location: [facility] ORS;  Service: Cardiovascular;  Laterality: N/A;   ELECTROPHYSIOLOGIC STUDY N/A 01/16/2016   Procedure: A-Flutter Ablation;  Surgeon: Elspeth BROCKS Sage, MD;  [facility]: [facility] INV[facility]IVE CV LAB;  Service: Cardiovascular;  Laterality: N/A;   LOWER EXTREMITY ANGIOGRAPHY Right 06/20/2023   Procedure: Lower Extremity Angiography;  Surgeon: Marea Selinda RAMAN, MD;  Location: [facility] INVASIVE CV LAB;  Service: Cardiovascular;  Laterality: Right;   LOWER EXTREMITY ANGIOGRAPHY Left 12/01/2023   Procedure: Lower Extremity Angiography;  Surgeon: Marea Selinda RAMAN, MD;  Location: [facility] INVASIVE CV LAB;  Service: Cardiovascular;  Laterality: Left;   TOTAL HIP ARTHROPLASTY Right 12/10/2021   Procedure: TOTAL HIP ARTHROPLASTY ANTERIOR APPROACH;  Surgeon: Lorelle Hussar, MD;  Location: [facility] ORS;  Service: Orthopedics;  Laterality: Right;    Social History:  reports that she quit smoking about 8 years ago. Her smoking use included cigarettes. She has never used smokeless tobacco. She reports current alcohol use. She reports that she does not use drugs.  Allergies  Allergen Reactions   Lisinopril  Swelling    Swelling of lips and face.  Tizanidine  Other (See Comments)    bradycardia   Amoxicillin  Itching and Rash   Latex Itching and Rash   Family History  Problem Relation Age of Onset   Hypertension Mother    Diabetes Mother    Diabetes Father    Diabetes Brother    Hypertension Brother    Hypertension Brother    Heart attack Brother    Diabetes Brother    Breast cancer Neg Hx    Family history: Family history reviewed and not pertinent.  Prior to  Admission medications   Medication Sig Start Date End Date Taking? Authorizing Provider  BREO ELLIPTA  100-25 MCG/ACT AEPB Inhale 1 puff into the lungs daily. 12/13/23  Yes [provider]  metoprolol  succinate (TOPROL  XL) 25 MG 24 hr tablet Take 0.5 tablets (12.5 mg total) by mouth as needed (As needed for palpitations). 11/03/23 11/02/24 Yes Wittenborn, Barnie, NP  acetaminophen  (TYLENOL ) 650 MG CR tablet Take 650-1,300 mg by mouth every 8 (eight) hours as needed for pain.    [provider]  albuterol  (VENTOLIN  HFA) 108 (90 Base) MCG/ACT inhaler Inhale 1-2 puffs into the lungs every 4 (four) hours as needed for wheezing or shortness of breath. Inhale 2-4 puffs by mouth every 4 hours as needed for wheezing, cough, and/or shortness of breath 06/08/23   Von Bellis, MD  amLODipine  (NORVASC ) 10 MG tablet Take 1 tablet (10 mg total) by mouth daily. 06/24/23 06/23/24  Riddle, Suzann, NP  ascorbic acid (VITAMIN C) 500 MG tablet Take 500 mg by mouth daily.    [provider]  aspirin  EC 81 MG tablet Take 1 tablet (81 mg total) by mouth daily. Swallow whole. 06/20/23 06/19/24  Marea Selinda RAMAN, MD  atorvastatin  (LIPITOR) 10 MG tablet Take 1 tablet (10 mg total) by mouth daily. 06/20/23 06/19/24  Marea Selinda RAMAN, MD  benzonatate  (TESSALON  PERLES) 100 MG capsule Take 1-2 tabs TID prn cough Patient not taking: Reported on 11/29/2023 06/23/23   Menshew, Candida LULLA Kings, PA-C  CELEBREX  100 MG capsule daily. 1-2 tabs prn for pain 07/26/23   [provider]  cholecalciferol  (VITAMIN D ) 1000 units tablet Take 1,000 Units by mouth daily.    [provider]  dicyclomine  (BENTYL ) 10 MG capsule Take 1 capsule (10 mg total) by mouth 3 (three) times daily as needed. 12/02/23   Goodman, Graydon, MD  ELIQUIS  5 MG TABS tablet TAKE 1 TABLET BY MOUTH TWICE A DAY 12/08/23   Riddle, Suzann, NP  levothyroxine  (SYNTHROID ) 100 MCG tablet Take 100 mcg by mouth daily before breakfast.    [provider]   lidocaine  (XYLOCAINE ) 5 % ointment Apply 1 Application topically as needed. Patient taking differently: Apply 1 Application topically as needed for moderate pain (pain score 4-6). 05/17/23   Tobie Franky SQUIBB, DPM  Lidocaine , Anorectal, 5 % CREA as needed (leg pain). 08/02/23   [provider]  losartan  (COZAAR ) 100 MG tablet Take 1 tablet (100 mg total) by mouth daily. 09/23/22   Furth, Cadence H, PA-C  oxybutynin  (DITROPAN -XL) 10 MG 24 hr tablet Take 1 tablet (10 mg total) by mouth at bedtime. Patient not taking: Reported on 11/29/2023 07/14/23   Wouk, Devaughn Sayres, MD  oxyCODONE  (OXY IR/ROXICODONE ) 5 MG immediate release tablet Take 5-10 mg by mouth every 4 (four) hours as needed.    [provider]  oxyCODONE -acetaminophen  (PERCOCET) 5-325 MG tablet Take 1 tablet by mouth every 4 (four) hours as needed for severe pain (pain score 7-10). 12/06/23  Dorothyann Drivers, MD  pantoprazole  (PROTONIX ) 40 MG tablet Take 1 tablet (40 mg total) by mouth daily. 05/24/23 12/01/23  Riddle, Suzann, NP  potassium chloride  SA (KLOR-CON  M) 20 MEQ tablet Take 2 tablets (40 mEq total) by mouth daily for 7 days. 07/08/23 11/03/23  Awanda City, MD   Physical Exam: Vitals:   12/30/23 1231 12/30/23 1235 12/30/23 1236 12/30/23 1500  BP:  123/79  (!) 94/48  Pulse:  (!) 107  77  Resp:  18  18  Temp:  98.2 F (36.8 C)    TempSrc:  Oral    SpO2: 98% 100%  100%  Weight:   68 kg   Height:   5' 5 (1.651 m)    Constitutional: appears age-appropriate, mildly anxious, calm Eyes: PERRL, lids and conjunctivae normal ENMT: Mucous membranes are moist. Posterior pharynx clear of any exudate or lesions. Age-appropriate dentition. Hearing appropriate Neck: normal, supple, no masses, no thyromegaly Respiratory: clear to auscultation bilaterally, no wheezing, no crackles. Normal respiratory effort. No accessory muscle use.  Cardiovascular: Regular rate and rhythm, no murmurs / rubs / gallops. No extremity edema. 2+ pedal  pulses. No carotid bruits.  Abdomen: no tenderness, no masses palpated, no hepatosplenomegaly. Bowel sounds positive.  Musculoskeletal: no clubbing / cyanosis. No joint deformity upper and lower extremities. Good ROM, no contractures, no atrophy. Normal muscle tone.  Skin: no rashes, lesions, ulcers. No induration Neurologic: Sensation intact. Strength 5/5 in all 4.  Psychiatric: Normal judgment and insight. Alert and oriented x 3. Normal mood.   EKG: independently reviewed, showing atrial flutter, 3-1, rate of 87, QTc 393  Chest x-ray on Admission: I personally reviewed and I agree with radiologist reading as below.  DG Chest 2 View Result Date: 12/30/2023 EXAM: 2 VIEW(S) XRAY OF THE CHEST 12/30/2023 12:55:00 PM COMPARISON: 12/06/2023 CLINICAL HISTORY: palpitations. Table formatting from the original note was not included.; Pt to ED via [facility] from home for palpitations. Pt stated having palpitation this morning around 0400. Pt took her PRN Metoprolol  this morning around 0600 but it has not helped. Pt has had 3 or 4 ablations in the past. EMS reports heart rate as high as ; 185. Pt was given Diltiazem  10 mg by EMS. Pt heart rate improved but is still between 80-120's. Pt denies chest pain. Pt is A \\T \ O x 4 and is in NAD. FINDINGS: LUNGS AND PLEURA: No focal pulmonary opacity. No pulmonary edema. No pleural effusion. No pneumothorax. HEART AND MEDIASTINUM: Stable cardiomegaly. No acute abnormality of the cardiac and mediastinal silhouettes. BONES AND SOFT TISSUES: No acute osseous abnormality. IMPRESSION: 1. No acute cardiopulmonary abnormality. Electronically signed by: Lynwood Seip MD 12/30/2023 02:17 PM EDT RP Workstation: [id number]   Labs on Admission: I have personally reviewed following labs  CBC: Recent Labs  Lab 12/30/23 1241  WBC 6.5  HGB 12.5  HCT 41.9  MCV 74.3*  PLT 288   Basic Metabolic Panel: Recent Labs  Lab 12/30/23 1241  NA 142  K 3.4*  CL 103  CO2 24  GLUCOSE 171*   BUN 16  CREATININE 0.76  CALCIUM  9.5   GFR: Estimated Creatinine Clearance: 58 mL/min (by C-G formula based on SCr of 0.76 mg/dL).  Urine analysis:    Component Value Date/Time   COLORURINE YELLOW (A) 12/02/2023 1528   APPEARANCEUR HAZY (A) 12/02/2023 1528   LABSPEC 1.016 12/02/2023 1528   PHURINE 6.0 12/02/2023 1528   GLUCOSEU NEGATIVE 12/02/2023 1528   HGBUR NEGATIVE 12/02/2023 1528   BILIRUBINUR  NEGATIVE 12/02/2023 1528   KETONESUR NEGATIVE 12/02/2023 1528   PROTEINUR NEGATIVE 12/02/2023 1528   NITRITE NEGATIVE 12/02/2023 1528   LEUKOCYTESUR TRACE (A) 12/02/2023 1528   This document was prepared using Dragon Voice Recognition software and may include unintentional dictation errors.  Dr. Sherre [facility]  If 7PM-7AM, please contact overnight-coverage provider If 7AM-7PM, please contact day attending provider [email or web address]  12/30/2023, 3:44 PM

## 2023-12-30 NOTE — Hospital Course (Signed)
 [name] is a 61 female with history of atrial fibrillation on Eliquis , status post multiple ablation, as needed metoprolol  due to heart rate going in the 40s, hypothyroid, GERD, hypertension, hyperlipidemia, atherosclerosis of the native cardiac arteries, history of stent placement, who presents ED for chief concerns of heart palpitation.  Vitals in the ED showed t of 98.2, rr of 18, hr 125, blood pressure 123/79, SpO2 of 100% on room air.  Serum sodium is 142, potassium 3.4, chloride 103, bicarb 24, BUN of 16, serum creatinine 0.76, eGFR greater than 60, nonfasting glucose 171, WBC 6.5, hemoglobin 12.5, platelets of 288.  HS trops 8.  ED treatment: Potassium chloride  40 mill equivalents p.o. one-time dose, diltiazem  gtt. initiated, sodium chloride  1 L bolus.

## 2023-12-30 NOTE — ED Provider Notes (Signed)
 [facility] Provider Note    Event Date/Time   First MD Initiated Contact with Patient 12/30/23 1233     (approximate)   History   Palpitations   HPI  [name] is a 73 year old female with history of A-fib on Eliquis  with multiple prior ablations presenting to the emergency department for evaluation of palpitations.  Woke up this morning around 4 AM with palpitations consistent with her prior episodes of A-fib.  Took her as needed metoprolol  without improvement.  With EMS patient had variable heart rates as high as 185.  Given a 10 mg diltiazem  bolus.  Had improved heart rate with intermittent tachycardia.  No associated chest pain or shortness of breath.  Reports feeling at her baseline over the past few days.  Reviewed cardiology visit from 08/05/2023.  At that time presented for follow-up of her arrhythmia.  Notes A-fib ablation in March of this year with no palpitations at that time.  Reviewed ER visit from 10/26/2023, presented with palpitations given diltiazem  bolus and oral diltiazem .  Discharged on metoprolol  with plans for cardiology follow-up.  She was noted to have bradycardia with metoprolol  so this was switched to as needed half tablet for palpitations.       Physical Exam   Triage Vital Signs: ED Triage Vitals  Encounter Vitals Group     BP 12/30/23 1235 123/79     Girls Systolic BP Percentile --      Girls Diastolic BP Percentile --      Boys Systolic BP Percentile --      Boys Diastolic BP Percentile --      Pulse Rate 12/30/23 1235 (!) 107     Resp 12/30/23 1235 18     Temp 12/30/23 1235 98.2 F (36.8 C)     Temp Source 12/30/23 1235 Oral     SpO2 12/30/23 1231 98 %     Weight 12/30/23 1236 150 lb (68 kg)     Height 12/30/23 1236 5' 5 (1.651 m)     Head Circumference --      Peak Flow --      Pain Score 12/30/23 1236 0     Pain Loc --      Pain Education --      Exclude from Growth Chart --     Most recent vital  signs: Vitals:   12/30/23 1231 12/30/23 1235  BP:  123/79  Pulse:  (!) 107  Resp:  18  Temp:  98.2 F (36.8 C)  SpO2: 98% 100%     General: Awake, interactive  CV:  Good peripheral perfusion, tachycardic with heart rates ranging from 100-130 Resp:  Unlabored respirations, lungs clear to auscultation Abd:  Nondistended, soft, nontender Neuro:  Symmetric facial movement, fluid speech   ED Results / Procedures / Treatments   Labs (all labs ordered are listed, but only abnormal results are displayed) Labs Reviewed  BASIC METABOLIC PANEL WITH GFR - Abnormal; Notable for the following components:      Result Value   Potassium 3.4 (*)    Glucose, Bld 171 (*)    All other components within normal limits  CBC - Abnormal; Notable for the following components:   RBC 5.64 (*)    MCV 74.3 (*)    MCH 22.2 (*)    MCHC 29.8 (*)    RDW 15.8 (*)    All other components within normal limits  MAGNESIUM  TROPONIN I (HIGH SENSITIVITY)  TROPONIN I (HIGH  SENSITIVITY)     EKG EKG independently reviewed and interpreted by myself demonstrates:  EKG demonstrates A-fib at a rate of 87, QRS 105, QTc 393, nonspecific ST changes noted  RADIOLOGY Imaging independently reviewed and interpreted by myself demonstrates:  CXR without focal consolidation on my review, formal radiology read pending  Formal Radiology Read:  DG Chest 2 View Result Date: 12/30/2023 EXAM: 2 VIEW(S) XRAY OF THE CHEST 12/30/2023 12:55:00 PM COMPARISON: 12/06/2023 CLINICAL HISTORY: palpitations. Table formatting from the original note was not included.; Pt to ED via [facility] from home for palpitations. Pt stated having palpitation this morning around 0400. Pt took her PRN Metoprolol  this morning around 0600 but it has not helped. Pt has had 3 or 4 ablations in the past. EMS reports heart rate as high as ; 185. Pt was given Diltiazem  10 mg by EMS. Pt heart rate improved but is still between 80-120's. Pt denies chest pain. Pt is A  \T\ O x 4 and is in NAD. FINDINGS: LUNGS AND PLEURA: No focal pulmonary opacity. No pulmonary edema. No pleural effusion. No pneumothorax. HEART AND MEDIASTINUM: Stable cardiomegaly. No acute abnormality of the cardiac and mediastinal silhouettes. BONES AND SOFT TISSUES: No acute osseous abnormality. IMPRESSION: 1. No acute cardiopulmonary abnormality. Electronically signed by: Lynwood Seip MD 12/30/2023 02:17 PM EDT RP Workstation: [id number]    PROCEDURES:  Critical Care performed: Yes, see critical care procedure note(s)  CRITICAL CARE Performed by: Nilsa Dade   Total critical care time: 32 minutes  Critical care time was exclusive of separately billable procedures and treating other patients.  Critical care was necessary to treat or prevent imminent or life-threatening deterioration.  Critical care was time spent personally by me on the following activities: development of treatment plan with patient and/or surrogate as well as nursing, discussions with consultants, evaluation of patient's response to treatment, examination of patient, obtaining history from patient or surrogate, ordering and performing treatments and interventions, ordering and review of laboratory studies, ordering and review of radiographic studies, pulse oximetry and re-evaluation of patient's condition.   Procedures   MEDICATIONS ORDERED IN ED: Medications  diltiazem  (CARDIZEM ) 125 mg in dextrose  5% 125 mL (1 mg/mL) infusion (5 mg/hr Intravenous New Bag/Given 12/30/23 1357)  sodium chloride  0.9 % bolus 1,000 mL (1,000 mLs Intravenous Bolus from Bag 12/30/23 1410)  potassium chloride  SA (KLOR-CON  M) CR tablet 40 mEq (40 mEq Oral Given 12/30/23 1412)     IMPRESSION / MDM / ASSESSMENT AND PLAN / ED COURSE  I reviewed the triage vital signs and the nursing notes.  Differential diagnosis includes, but is not limited to, arrhythmia, anemia, electrolyte abnormality, thyroid  dysfunction, lower suspicion  ACS  Patient's presentation is most consistent with acute presentation with potential threat to life or bodily function.  73 year old female presenting with palpitations consistent with prior episodes of A-fib.  Trial metoprolol  at home, received diltiazem  with EMS with some improvement.  Does have persistent intermittent tachycardia here though significantly improved.  Will place on diltiazem  drip.  Will reach out to hospitalist to discuss admission.  Clinical Course as of 12/30/23 1433  Fri Dec 30, 2023  1431 Case discussed with Dr. Sherre.  She will evaluate for anticipate admission. [NR]    Clinical Course User Index [NR] Dade Nilsa, MD     FINAL CLINICAL IMPRESSION(S) / ED DIAGNOSES   Final diagnoses:  Atrial fibrillation with rapid ventricular response (HCC)     Rx / DC Orders   ED Discharge Orders  None        Note:  This document was prepared using Dragon voice recognition software and may include unintentional dictation errors.   Levander Slate, MD 12/30/23 [phone]

## 2023-12-30 NOTE — Assessment & Plan Note (Addendum)
 With palpitations Feeling as needed home therapy, metoprolol  12.5 mg p.o. as prescribed by outpatient cardiology Patient reports her heart rate was in the 180s and was the same when EMS arrived Per ED nursing, EMS gave patient diltiazem  10 mg IV one-time dose and patient heart rate improved but still remained between 80 and 120s Patient is on diltiazem  gtt. Cardiology, Dr. Argentina, has been consulted via secure chat and Epic order Admit to telemetry

## 2023-12-30 NOTE — Assessment & Plan Note (Signed)
 -  As needed nicotine patch ordered ?

## 2023-12-30 NOTE — Assessment & Plan Note (Signed)
 Home aspirin  81 mg daily and Eliquis  5 mg p.o. twice daily resumed

## 2023-12-30 NOTE — Assessment & Plan Note (Signed)
 -  Atorvastatin 10 mg nightly resumed

## 2023-12-30 NOTE — ED Triage Notes (Signed)
 Pt to ED via [facility] from home for palpitations. Pt stated having palpitation this morning around 0400. Pt took her PRN Metoprolol  this morning around 0600 but it has not helped. Pt has had 3 or 4 ablations in the past. EMS reports heart rate as high as 185. Pt was given Diltiazem  10 mg by EMS. Pt heart rate improved but is still between 80-120's. Pt denies chest pain. Pt is A & O x 4 and is in NAD.

## 2023-12-30 NOTE — Consult Note (Signed)
 Cardiology Consultation:   Patient ID: [name]; [id number]; 1950/05/02   Admit date: 12/30/2023 Date of Consult: 12/30/2023  Primary Care Provider: [facility] Primary Cardiologist: Darron Primary Electrophysiologist:  Cindie   Patient Profile:   [name] is a 72 y.o. female with a hx of CAD noted on CT imaging, atrial flutter ablation in 2017, atrial flutter status post ablation on 01/16/2016, A-fib status post catheter ablation on 05/20/2023, bradycardia, mild to moderate mitral regurgitation, HTN with prior angioedema on lisinopril , PAD s/p extensive intervention to the right lower extremity in 06/2023 and a left lower extremity in 11/2023 followed by vascular surgery, and LVH  who is being seen today for the evaluation of atrial flutter at the request of Dr. Sherre.  History of Present Illness:   [name] underwent atrial flutter ablation by Dr. Fernande in 2017.  Echo at that time showed an EF of 50 to 55%, no regional wall motion abnormalities, moderate LVH, moderately calcified mitral annulus, mildly dilated left atrium, mildly dilated right ventricle, moderately to severely dilated right atrium, and a trivial pericardial effusion.  Echo in 2021 showed an EF of 55 to 60%, no regional wall motion abnormalities, grade 2 diastolic dysfunction, normal RV systolic function and ventricular cavity size, moderately elevated RVSP, severely dilated left atrium, and mild to moderate mitral regurgitation.   She was seen in the ED in 09/2022 with palpitations and found to be in A-fib with RVR by EMS, spontaneously converting to sinus rhythm prior to arrival to the ED.  She was seen again in the ED later in July of 2024 with bradycardia with rates in the 40s bpm.  Follow-up Zio patch x 2 showed no evidence of further A-fib.  It was recommended she undergo loop implantation to evaluate A-fib burden and to take aspirin .  Echo in 10/2022 showed an EF of 55 to 60%, no regional wall  motion abnormalities, grade 2 diastolic dysfunction, elevated left atrial pressure, normal RV systolic function with mildly increased RV wall thickness and normal RVSP, severely dilated left atrium, moderately dilated right atrium, small pericardial effusion localized near the right atrium and anterior to the RV, mild to moderate mitral regurgitation, and an estimated right atrial pressure of 8 mmHg.   She was seen in the ED twice in 03/2023 with A-fib with RVR with recommendation to start apixaban .  She was evaluated by EP in 04/2023 with recommendation to proceed with ablation.  As part of her precatheter ablation evaluation she underwent coronary morphology on 05/20/2023 that demonstrated a calcium  score of 672 which was the 94th percentile along with normal pulmonary vein drainage into the left atrium.  The esophagus rate and in the left atrial midline and was not in the proximity to any of the pulmonary veins.  She underwent catheter ablation for A-fib on 05/24/2023.  Most recent echo from 05/2023 showed an EF of 60 to 65%, no regional wall motion abnormalities, mild asymmetric LVH of the basal septal segment, normal RV systolic function and ventricular cavity size, severely dilated left atrium, mildly dilated right atrium, trivial mitral regurgitation, and aortic valve sclerosis without evidence of stenosis.  She was in her usual state of health until around 4 AM this morning when she was woken up from sleep with sudden onset of tachypalpitations and indigestion.  Symptoms felt similar to what she experienced in 2016 and 2017 when she was initially diagnosed with atrial flutter.  She took a as needed of metoprolol  12.5  mg though palpitations persisted.  In this setting she contacted EMS who found the patient to be in atrial flutter with RVR with ventricular rates in the 180s bpm.  She was given IV diltiazem  with improvement in heart rate to the 80s to 120s bpm.  Upon arrival to [facility] she remained in atrial  flutter with variable AV block with ventricular rates in 80s to 120s bpm.  With improvement in ventricular rates she has noted improvement and subsequent resolution of palpitations.  No further indigestion.  She denies frank chest pain, dyspnea, dizziness, presyncope, or syncope.  No lower extremity swelling or progressive orthopnea.  She has been without any exertional symptoms leading up to her admission today.  She has been adherent to apixaban , taking this twice daily every day and denies missing any doses, particularly when asked directly over the preceding 4 weeks.  Chest x-ray shows no acute cardiopulmonary abnormality.  Initial high-sensitivity troponin negative.  Potassium 3.4.  Magnesium 2.2.  In the ER she was placed on a diltiazem  drip at 5 mg/h.  She remains in atrial flutter with variable AV block with rates largely in the 90s to low 100s bpm.     Past Medical History:  Diagnosis Date   Anemia    Arthritis    Atrial flutter (HCC) 09/2015   a.) CHADS2VASc 2 (sex, HTN); b.) s/p DCCV 11/11/2015 (150 J); c.) s/p RFCA 01/17/2016; d.) rate/rhythm maintained without pharmacological intervention; no chronic anticoagulation.   Coronary artery calcification seen on CT scan    Diastolic dysfunction 08/23/2019   a.)  TTE 08/23/2019: EF 55-60%, severe LAE, mild-mod MR, PASP 46.2, G2DD   Dyspnea    Essential hypertension    GERD (gastroesophageal reflux disease)    Hepatic steatosis    History of kidney stones    Incomplete left bundle branch block (LBBB)    Paroxysmal A-fib (HCC)    Pneumonia    as a child   Secondary hypercoagulable state    Subclinical hypothyroidism    Tobacco abuse    Typical atrial flutter (HCC)     Past Surgical History:  Procedure Laterality Date   ABDOMINAL HYSTERECTOMY     ABLATION OF DYSRHYTHMIC FOCUS  01/16/2016   ATRIAL FIBRILLATION ABLATION N/A 05/24/2023   Procedure: ATRIAL FIBRILLATION ABLATION;  Surgeon: Cindie Ole DASEN, MD;  Location: [facility] INVASIVE  CV LAB;  Service: Cardiovascular;  Laterality: N/A;   CYSTOSCOPY WITH STENT PLACEMENT Left 07/05/2023   Procedure: CYSTOSCOPY, WITH STENT INSERTION;  Surgeon: Twylla Glendia BROCKS, MD;  Location: [facility] ORS;  Service: Urology;  Laterality: Left;   CYSTOSCOPY/URETEROSCOPY/HOLMIUM LASER/STENT PLACEMENT Left 07/19/2023   Procedure: CYSTOSCOPY/URETEROSCOPY/HOLMIUM LASER/STENT PLACEMENT;  Surgeon: Twylla Glendia BROCKS, MD;  Location: [facility] ORS;  Service: Urology;  Laterality: Left;  Exchange   ELECTROPHYSIOLOGIC STUDY N/A 11/11/2015   Procedure: CARDIOVERSION;  Surgeon: Evalene JINNY Lunger, MD;  Location: [facility] ORS;  Service: Cardiovascular;  Laterality: N/A;   ELECTROPHYSIOLOGIC STUDY N/A 01/16/2016   Procedure: A-Flutter Ablation;  Surgeon: Elspeth BROCKS Sage, MD;  [facility] INVASIVE CV LAB;  Service: Cardiovascular;  Laterality: N/A;   LOWER EXTREMITY ANGIOGRAPHY Right 06/20/2023   Procedure: Lower Extremity Angiography;  Surgeon: Marea Selinda RAMAN, MD;  Location: [facility] INVASIVE CV LAB;  Service: Cardiovascular;  Laterality: Right;   LOWER EXTREMITY ANGIOGRAPHY Left 12/01/2023   Procedure: Lower Extremity Angiography;  Surgeon: Marea Selinda RAMAN, MD;  Location: [facility] INVASIVE CV LAB;  Service: Cardiovascular;  Laterality: Left;   TOTAL HIP ARTHROPLASTY Right 12/10/2021  Procedure: TOTAL HIP ARTHROPLASTY ANTERIOR APPROACH;  Surgeon: Lorelle Hussar, MD;  Location: [facility] ORS;  Service: Orthopedics;  Laterality: Right;     Home Meds: Prior to Admission medications   Medication Sig Start Date End Date Taking? Authorizing Provider  BREO ELLIPTA  100-25 MCG/ACT AEPB Inhale 1 puff into the lungs daily. 12/13/23  Yes [provider]  metoprolol  succinate (TOPROL  XL) 25 MG 24 hr tablet Take 0.5 tablets (12.5 mg total) by mouth as needed (As needed for palpitations). 11/03/23 11/02/24 Yes Wittenborn, Barnie, NP  acetaminophen  (TYLENOL ) 650 MG CR tablet Take 650-1,300 mg by mouth every 8 (eight) hours as needed for pain.    [provider]  albuterol  (VENTOLIN  HFA) 108 (90 Base) MCG/ACT inhaler Inhale 1-2 puffs into the lungs every 4 (four) hours as needed for wheezing or shortness of breath. Inhale 2-4 puffs by mouth every 4 hours as needed for wheezing, cough, and/or shortness of breath 06/08/23   Von Bellis, MD  amLODipine  (NORVASC ) 10 MG tablet Take 1 tablet (10 mg total) by mouth daily. 06/24/23 06/23/24  Riddle, Suzann, NP  ascorbic acid (VITAMIN C) 500 MG tablet Take 500 mg by mouth daily.    [provider]  aspirin  EC 81 MG tablet Take 1 tablet (81 mg total) by mouth daily. Swallow whole. 06/20/23 06/19/24  Marea Selinda RAMAN, MD  atorvastatin  (LIPITOR) 10 MG tablet Take 1 tablet (10 mg total) by mouth daily. 06/20/23 06/19/24  Marea Selinda RAMAN, MD  benzonatate  (TESSALON  PERLES) 100 MG capsule Take 1-2 tabs TID prn cough Patient not taking: Reported on 11/29/2023 06/23/23   Menshew, Candida LULLA Kings, PA-C  CELEBREX  100 MG capsule daily. 1-2 tabs prn for pain 07/26/23   [provider]  cholecalciferol  (VITAMIN D ) 1000 units tablet Take 1,000 Units by mouth daily.    [provider]  dicyclomine  (BENTYL ) 10 MG capsule Take 1 capsule (10 mg total) by mouth 3 (three) times daily as needed. 12/02/23   Floy Roberts, MD  ELIQUIS  5 MG TABS tablet TAKE 1 TABLET BY MOUTH TWICE A DAY 12/08/23   Riddle, Suzann, NP  levothyroxine  (SYNTHROID ) 100 MCG tablet Take 100 mcg by mouth daily before breakfast.    [provider]  lidocaine  (XYLOCAINE ) 5 % ointment Apply 1 Application topically as needed. Patient taking differently: Apply 1 Application topically as needed for moderate pain (pain score 4-6). 05/17/23   Tobie Franky SQUIBB, DPM  Lidocaine , Anorectal, 5 % CREA as needed (leg pain). 08/02/23   [provider]  losartan  (COZAAR ) 100 MG tablet Take 1 tablet (100 mg total) by mouth daily. 09/23/22   Furth, Cadence H, PA-C  oxybutynin  (DITROPAN -XL) 10 MG 24 hr tablet Take 1 tablet (10 mg total) by mouth at  bedtime. Patient not taking: Reported on 11/29/2023 07/14/23   Wouk, Devaughn Sayres, MD  oxyCODONE  (OXY IR/ROXICODONE ) 5 MG immediate release tablet Take 5-10 mg by mouth every 4 (four) hours as needed.    [provider]  oxyCODONE -acetaminophen  (PERCOCET) 5-325 MG tablet Take 1 tablet by mouth every 4 (four) hours as needed for severe pain (pain score 7-10). 12/06/23   Dorothyann Franky, MD  pantoprazole  (PROTONIX ) 40 MG tablet Take 1 tablet (40 mg total) by mouth daily. 05/24/23 12/01/23  Riddle, Suzann, NP  potassium chloride  SA (KLOR-CON  M) 20 MEQ tablet Take 2 tablets (40 mEq total) by mouth daily for 7 days. 07/08/23 11/03/23  Awanda City, MD    Inpatient Medications: Scheduled Meds:  apixaban   5 mg Oral BID   [START ON 12/31/2023] aspirin  EC  81 mg Oral Daily   atorvastatin   10 mg Oral QHS   [START ON 12/31/2023] fluticasone  furoate-vilanterol  1 puff Inhalation Daily   [START ON 12/31/2023] levothyroxine   100 mcg Oral QAC breakfast   [START ON 12/31/2023] pantoprazole   40 mg Oral Daily   Continuous Infusions:  diltiazem  (CARDIZEM ) infusion 5 mg/hr (12/30/23 1357)   PRN Meds: acetaminophen  **OR** acetaminophen , albuterol , metoprolol  tartrate, ondansetron  **OR** ondansetron  (ZOFRAN ) IV, senna-docusate  Allergies:   Allergies  Allergen Reactions   Lisinopril  Swelling    Swelling of lips and face.    Tizanidine  Other (See Comments)    bradycardia   Amoxicillin  Itching and Rash   Latex Itching and Rash    Social History:   Social History   Socioeconomic History   Marital status: Widowed    Spouse name: Not on file   Number of children: 4   Years of education: Not on file   Highest education level: Not on file  Occupational History   Not on file  Tobacco Use   Smoking status: Former    Current packs/day: 0.00    Types: Cigarettes    Quit date: 11/19/2015    Years since quitting: 8.1   Smokeless tobacco: Never  Vaping Use   Vaping status: Never Used  Substance and  Sexual Activity   Alcohol use: Yes    Comment: occasionally   Drug use: No   Sexual activity: Yes    Birth control/protection: Surgical  Other Topics Concern   Not on file  Social History Narrative   Lives with S.O. Tommy Haits 21 years. Youngest daughter stays with her also . No pets    Social Drivers of Corporate investment banker Strain: Not on file  Food Insecurity: No Food Insecurity (07/12/2023)   Hunger Vital Sign    Worried About Running Out of Food in the Last Year: Never true    Ran Out of Food in the Last Year: Never true  Transportation Needs: No Transportation Needs (07/12/2023)   PRAPARE - Administrator, Civil Service (Medical): No    Lack of Transportation (Non-Medical): No  Physical Activity: Not on file  Stress: Not on file  Social Connections: Socially Isolated (07/12/2023)   Social Connection and Isolation Panel    Frequency of Communication with Friends and Family: More than three times a week    Frequency of Social Gatherings with Friends and Family: More than three times a week    Attends Religious Services: Never    Database administrator or Organizations: No    Attends Banker Meetings: Never    Marital Status: Widowed  Intimate Partner Violence: Not At Risk (07/12/2023)   Humiliation, Afraid, Rape, and Kick questionnaire    Fear of Current or Ex-Partner: No    Emotionally Abused: No    Physically Abused: No    Sexually Abused: No     Family History:   Family History  Problem Relation Age of Onset   Hypertension Mother    Diabetes Mother    Diabetes Father    Diabetes Brother    Hypertension Brother    Hypertension Brother    Heart attack Brother    Diabetes Brother    Breast cancer Neg Hx     ROS:  Review of Systems  Constitutional:  Negative for chills, diaphoresis, fever, malaise/fatigue and weight loss.  HENT:  Negative for congestion.  Eyes:  Negative for discharge and redness.  Respiratory:  Negative for  cough, sputum production, shortness of breath and wheezing.   Cardiovascular:  Positive for palpitations. Negative for chest pain, orthopnea, claudication, leg swelling and PND.  Gastrointestinal:  Positive for heartburn. Negative for abdominal pain, blood in stool, melena, nausea and vomiting.  Musculoskeletal:  Negative for falls and myalgias.  Skin:  Negative for rash.  Neurological:  Negative for dizziness, tingling, tremors, sensory change, speech change, focal weakness, loss of consciousness and weakness.  Endo/Heme/Allergies:  Does not bruise/bleed easily.  Psychiatric/Behavioral:  Negative for substance abuse. The patient is not nervous/anxious.   All other systems reviewed and are negative.     Physical Exam/Data:   Vitals:   12/30/23 1231 12/30/23 1235 12/30/23 1236 12/30/23 1500  BP:  123/79  (!) 94/48  Pulse:  (!) 107  77  Resp:  18  18  Temp:  98.2 F (36.8 C)    TempSrc:  Oral    SpO2: 98% 100%  100%  Weight:   68 kg   Height:   5' 5 (1.651 m)    No intake or output data in the 24 hours ending 12/30/23 1624 Filed Weights   12/30/23 1236  Weight: 68 kg   Body mass index is 24.96 kg/m.   Physical Exam: General: Well developed, well nourished, in no acute distress. Head: Normocephalic, atraumatic, sclera non-icteric, no xanthomas, nares without discharge.  Neck: Negative for carotid bruits. JVD not elevated. Lungs: Clear bilaterally to auscultation without wheezes, rales, or rhonchi. Breathing is unlabored. Heart: Irregularly irregular with S1 S2. No murmurs, rubs, or gallops appreciated. Abdomen: Soft, non-tender, non-distended with normoactive bowel sounds. No hepatomegaly. No rebound/guarding. No obvious abdominal masses. Msk:  Strength and tone appear normal for age. Extremities: No clubbing or cyanosis. No edema. Distal pedal pulses are 2+ and equal bilaterally. Neuro: Alert and oriented X 3. No facial asymmetry. No focal deficit. Moves all extremities  spontaneously. Psych:  Responds to questions appropriately with a normal affect.   EKG:  The EKG was personally reviewed and demonstrates: Atrial flutter with variable AV block, 87 bpm, LVH with early repolarization abnormality and nonspecific ST-T changes Telemetry:  Telemetry was personally reviewed and demonstrates: Atrial flutter with variable AV block with rates in the 80s to 100s bpm improved from initial rates in the 120s bpm  Weights: Filed Weights   12/30/23 1236  Weight: 68 kg    Relevant CV Studies:  2D echo 06/07/2023: 1. Left ventricular ejection fraction, by estimation, is 60 to 65%. The  left ventricle has normal function. The left ventricle has no regional  wall motion abnormalities. There is mild asymmetric left ventricular  hypertrophy of the basal-septal segment.  Left ventricular diastolic parameters are indeterminate.   2. Right ventricular systolic function is normal. The right ventricular  size is normal. Tricuspid regurgitation signal is inadequate for assessing  PA pressure.   3. Left atrial size was severely dilated.   4. Right atrial size was mildly dilated.   5. The mitral valve is normal in structure. Trivial mitral valve  regurgitation. No evidence of mitral stenosis.   6. The aortic valve is normal in structure. Aortic valve regurgitation is  not visualized. Aortic valve sclerosis is present, with no evidence of  aortic valve stenosis.   7. The inferior vena cava is normal in size with greater than 50%  respiratory variability, suggesting right atrial pressure of 3 mmHg.  __________  Coronary morphology 05/16/2023:  1. There is normal pulmonary vein drainage into the left atrium. (2 on the right and 2 on the left via a common trunk on left) with ostial measurements as above.   2. The left atrial appendage is a Windsock-cactus type with two lobes and ostial size 38 x 27 mm and length 46 mm, Area 74 mm2. There is no thrombus in the left atrial  appendage.   3. The esophagus runs in the left atrial midline and is not in the proximity to any of the pulmonary veins.   4. Coronary calcium  score 672. This is 94th percentile for age/gender. __________   2D echo 10/20/2022: 1. Left ventricular ejection fraction, by estimation, is 55 to 60%. The  left ventricle has normal function. The left ventricle has no regional  wall motion abnormalities. Left ventricular diastolic parameters are  consistent with Grade II diastolic  dysfunction (pseudonormalization). Elevated left atrial pressure. The  average left ventricular global longitudinal strain is -12.7 %. The global  longitudinal strain is abnormal.   2. Right ventricular systolic function is normal. The right ventricular  size is normal. Mildly increased right ventricular wall thickness. There  is normal pulmonary artery systolic pressure.   3. Left atrial size was severely dilated.   4. Right atrial size was moderately dilated.   5. A small pericardial effusion is present. The pericardial effusion is  localized near the right atrium and anterior to the right ventricle.   6. The mitral valve is grossly normal. Mild to moderate mitral valve  regurgitation. No evidence of mitral stenosis.   7. The aortic valve was not well visualized. Aortic valve regurgitation  is not visualized. No aortic stenosis is present.   8. The inferior vena cava is normal in size with <50% respiratory  variability, suggesting right atrial pressure of 8 mmHg.  __________   Zio patch 09/2022: Monitor 1 Patient had a min HR of 38 bpm, max HR of 174 bpm, and avg HR of 57 bpm. Predominant underlying rhythm was Sinus Rhythm. 10 Supraventricular Tachycardia runs occurred, the run with the fastest interval lasting 15 beats with a max rate of 174 bpm (avg 138  bpm); the run with the fastest interval was also the longest. Idioventricular Rhythm was present.  Rare PACs and rare PVCs.   Monitor 2 Patient had a min HR  of 35 bpm, max HR of 203 bpm, and avg HR of 58 bpm. Predominant underlying rhythm was Sinus Rhythm. 1 run of Ventricular Tachycardia occurred lasting 5 beats with a max rate of 133 bpm (avg 128 bpm). 20 Supraventricular Tachycardia  runs occurred, the run with the fastest interval lasting 5 beats with a max rate of 203 bpm, the longest lasting 15 beats with an avg rate of 100 bpm.  Rare PACs and rare PVCs. __________   2D echo 08/23/2019: 1. Left ventricular ejection fraction, by estimation, is 55 to 60%. The  left ventricle has normal function. The left ventricle has no regional  wall motion abnormalities. Left ventricular diastolic parameters are  consistent with Grade II diastolic  dysfunction (pseudonormalization).   2. Right ventricular systolic function is normal. The right ventricular  size is normal. There is moderately elevated pulmonary artery systolic  pressure.   3. Left atrial size was severely dilated.   4. The mitral valve is normal in structure. Mild to moderate mitral valve  regurgitation.  ___________   24-hour Holter 11/2015: Overall rhythm was sinus. Heart rate ranged from 45-127 BPM, average of  62 BPM. The maximum heart rate was actually a run of atrial tachycardia, as described below. Longest RR interval was normal at 1.76 seconds.   Supraventricular ectopy: 193 isolated PACs. 11 atrial couplets. 3 runs totaling 26 beats - 17 beats run of likely atrial tachycardia at 128 BPM as the longest. The fastest was a 5 beat run of likely atrial tachycardia at 154 BPM.   Ventricular ectopy: 80 isolated PVCs. 2 ventricular couplets. No ventricular runs.   No documented atrial fibrillation. __________   Lexiscan  MPI 09/25/2015: Rhythm remained in atrial flutter throughout the study. There was no ST segment deviation noted during stress. Myocardial perfusion imaging revealed normal perfusion on stress and rest. No evidence of ischemia. This is a low risk study. Nuclear stress  EF: 47%. Please verify with echocardiogram. __________   2D echo 09/23/2015: - Left ventricle: The cavity size was normal. Wall thickness was    increased in a pattern of moderate LVH. Systolic function was    normal. The estimated ejection fraction was in the range of 50%    to 55%. Wall motion was normal; there were no regional wall    motion abnormalities.  - Mitral valve: Moderately calcified annulus.  - Left atrium: The atrium was mildly dilated.  - Right ventricle: The cavity size was mildly dilated.  - Right atrium: The atrium was moderately to severely dilated.  - Pericardium, extracardiac: A trivial pericardial effusion was    identified.   Laboratory Data:  Chemistry Recent Labs  Lab 12/30/23 1241  NA 142  K 3.4*  CL 103  CO2 24  GLUCOSE 171*  BUN 16  CREATININE 0.76  CALCIUM  9.5  GFRNONAA >60  ANIONGAP 15    No results for input(s): PROT, ALBUMIN, AST, ALT, ALKPHOS, BILITOT in the last 168 hours. Hematology Recent Labs  Lab 12/30/23 1241  WBC 6.5  RBC 5.64*  HGB 12.5  HCT 41.9  MCV 74.3*  MCH 22.2*  MCHC 29.8*  RDW 15.8*  PLT 288   Cardiac EnzymesNo results for input(s): TROPONINI in the last 168 hours. No results for input(s): TROPIPOC in the last 168 hours.  BNPNo results for input(s): BNP, PROBNP in the last 168 hours.  DDimer No results for input(s): DDIMER in the last 168 hours.  Radiology/Studies:  DG Chest 2 View Result Date: 12/30/2023 IMPRESSION: 1. No acute cardiopulmonary abnormality. Electronically signed by: Lynwood Seip MD 12/30/2023 02:17 PM EDT RP Workstation: [id number]    Assessment and Plan:   1.  Atrial flutter with RVR with history of A-fib status post ablation in 05/2023: - Status post atrial flutter ablation in 01/2016 and A-fib ablation in 05/2023 - Admitted with atrial flutter with RVR - Continue diltiazem  drip for today with likely transition to oral diltiazem  on 10/18 with dose determined by drip  requirement - Continue apixaban  5 mg twice daily (she does not meet reduced dosing criteria) and she confirms not missing any doses, taking this twice daily every day for at least the past month - Replete potassium to goal 4.0 - Magnesium at goal - Obtain TSH - If she remains in atrial flutter on 10/20, will plan for DCCV and EP evaluation  2.  CAD involving the native coronary arteries without angina: - Never with symptoms of angina - Initial high-sensitivity troponin negative, repeat delta - Obtain echo as ventricular rates improved to evaluate for new cardiomyopathy - Has historically been maintained on apixaban  in lieu of aspirin , though now given recent vascular surgery  intervention that she is on dual therapy with DOAC and aspirin  - PTA atorvastatin  10 mg  3.  HTN with LVH: - Blood pressure currently soft on low-dose diltiazem  drip - Hold PTA amlodipine , losartan , and as needed Toprol   4.  Hypokalemia: - Replete to goal 4.0 - Magnesium at goal  5.  PAD: - Aspirin  and statin - Management per vascular surgery      For questions or updates, please contact [facility] Please consult [email or web address] for contact info under Cardiology/STEMI.   Signed, Bernardino Bring, PA-C [facility] Pager: [phone] 12/30/2023, 4:24 PM

## 2023-12-31 DIAGNOSIS — I483 Typical atrial flutter: Secondary | ICD-10-CM

## 2023-12-31 DIAGNOSIS — I739 Peripheral vascular disease, unspecified: Secondary | ICD-10-CM

## 2023-12-31 DIAGNOSIS — I25118 Atherosclerotic heart disease of native coronary artery with other forms of angina pectoris: Secondary | ICD-10-CM

## 2023-12-31 DIAGNOSIS — E782 Mixed hyperlipidemia: Secondary | ICD-10-CM | POA: Diagnosis not present

## 2023-12-31 DIAGNOSIS — I10 Essential (primary) hypertension: Secondary | ICD-10-CM

## 2023-12-31 DIAGNOSIS — I4891 Unspecified atrial fibrillation: Principal | ICD-10-CM

## 2023-12-31 LAB — BASIC METABOLIC PANEL WITH GFR
Anion gap: 10 (ref 5–15)
BUN: 18 mg/dL (ref 8–23)
CO2: 23 mmol/L (ref 22–32)
Calcium: 8.6 mg/dL — ABNORMAL LOW (ref 8.9–10.3)
Chloride: 106 mmol/L (ref 98–111)
Creatinine, Ser: 0.87 mg/dL (ref 0.44–1.00)
GFR, Estimated: >60 mL/min (ref >60)
Glucose, Bld: 141 mg/dL — ABNORMAL HIGH (ref 70–99)
Potassium: 4.1 mmol/L (ref 3.5–5.1)
Sodium: 139 mmol/L (ref 135–145)

## 2023-12-31 LAB — CBC
HCT: 31.2 % — ABNORMAL LOW (ref 36.0–46.0)
Hemoglobin: 9.7 g/dL — ABNORMAL LOW (ref 12.0–15.0)
MCH: 23.4 pg — ABNORMAL LOW (ref 26.0–34.0)
MCHC: 31.1 g/dL (ref 30.0–36.0)
MCV: 75.2 fL — ABNORMAL LOW (ref 80.0–100.0)
Platelets: 214 K/uL (ref 150–400)
RBC: 4.15 MIL/uL (ref 3.87–5.11)
RDW: 15.9 % — ABNORMAL HIGH (ref 11.5–15.5)
WBC: 5.2 K/uL (ref 4.0–10.5)
nRBC: 0 % (ref 0.0–0.2)

## 2023-12-31 LAB — HEMOGLOBIN AND HEMATOCRIT, BLOOD
HCT: 42.6 % (ref 36.0–46.0)
Hemoglobin: 12.9 g/dL (ref 12.0–15.0)

## 2023-12-31 LAB — T4, FREE: Free T4: 1 ng/dL (ref 0.61–1.12)

## 2023-12-31 MED ORDER — OXYCODONE HCL 5 MG PO TABS
5.0000 mg | ORAL_TABLET | Freq: Four times a day (QID) | ORAL | Status: DC | PRN
  Administered 2023-12-31 (×2): 5 mg via ORAL
  Filled 2023-12-31 (×2): qty 1

## 2023-12-31 MED ORDER — DILTIAZEM HCL ER COATED BEADS 120 MG PO CP24
120.0000 mg | ORAL_CAPSULE | Freq: Every day | ORAL | Status: DC
  Administered 2023-12-31 – 2024-01-02 (×3): 120 mg via ORAL
  Filled 2023-12-31 (×3): qty 1

## 2023-12-31 NOTE — ED Notes (Signed)
 Per MD Djan, turn off pt's Cardizem  gtt a this time. Pt's HR 68 at this time.

## 2023-12-31 NOTE — ED Notes (Signed)
 Tylenol  given PO, not suppository

## 2023-12-31 NOTE — Progress Notes (Signed)
 Progress Note   Patient: [name] FMW:[id number] DOB: August 24, 1950 DOA: 12/30/2023     1 DOS: the patient was seen and examined on 12/31/2023   Brief hospital course:  [name] is a 23 female with history of atrial fibrillation on Eliquis , status post multiple ablation, as needed metoprolol  due to heart rate going in the 40s, hypothyroid, GERD, hypertension, hyperlipidemia, atherosclerosis of the native cardiac arteries, history of stent placement, who presents ED for chief concerns of heart palpitation.   Assessment and Plan: Acute atrial flutter (HCC) With palpitations Feeling as needed home therapy, metoprolol  12.5 mg p.o. as prescribed by outpatient cardiology Patient reports her heart rate was in the 180s and was the same when EMS arrived Patient required diltiazem  drip upon presentation this has been weaned off Patient initiated on oral Cardizem  CD1 20 mg daily Continue Eliquis  Continue telemetry Patient seen and cleared by cardiologist for discharge today    PAD (peripheral artery disease) Home aspirin  81 mg daily and Eliquis  5 mg p.o. twice daily resumed   Hyperlipidemia Atorvastatin  10 mg nightly resumed   Hypothyroidism, unspecified Levothyroxine  100 mcg   Tobacco use disorder-resolved as of 07/14/2019 As needed nicotine  patch ordered    DVT prophylaxis: Eliquis  5 mg p.o. twice daily Code Status: Full code Diet: Heart healthy diet Disposition Plan: Pending clinical course Consults called: Cardiology Admission status: Telemetry cardiac, inpatient  Subjective:  Patient seen and examined at bedside this morning in the presence of the husband Denies nausea vomiting abdominal pain chest pain cough   Physical Exam: Neck: normal, supple, no masses, no thyromegaly Respiratory: clear to auscultation bilaterally, no wheezing, no crackles. Normal respiratory effort. No accessory muscle use.  Cardiovascular: Regular rate and rhythm, no murmurs / rubs /  gallops. No extremity edema. 2+ pedal pulses. No carotid bruits.  Abdomen: no tenderness, no masses palpated, no hepatosplenomegaly. Bowel sounds positive.  Musculoskeletal: no clubbing / cyanosis. No joint deformity upper and lower extremities. Good ROM, no contractures, no atrophy. Normal muscle tone.  Skin: no rashes, lesions, ulcers. No induration Neurologic: Sensation intact. Strength 5/5 in all 4.  Psychiatric: Normal judgment and insight. Alert and oriented x 3. Normal mood.   Vitals:   12/31/23 1230 12/31/23 1242 12/31/23 1245 12/31/23 1512  BP:   (!) 137/98 127/65  Pulse: (!) 56 71 75 67  Resp: 20 16 19    Temp:   97.8 F (36.6 C) 98.8 F (37.1 C)  TempSrc:   Oral Oral  SpO2: 98% 100% 98% 100%  Weight:      Height:        Data Reviewed:     Latest Ref Rng & Units 12/31/2023   11:41 AM 12/31/2023    6:20 AM 12/30/2023   12:41 PM  CBC  WBC 4.0 - 10.5 K/uL  5.2  6.5   Hemoglobin 12.0 - 15.0 g/dL 87.0  9.7  87.4   Hematocrit 36.0 - 46.0 % 42.6  31.2  41.9   Platelets 150 - 400 K/uL  214  288        Latest Ref Rng & Units 12/31/2023    6:20 AM 12/30/2023   12:41 PM 12/02/2023    2:49 PM  BMP  Glucose 70 - 99 mg/dL 858  828  885   BUN 8 - 23 mg/dL 18  16  11    Creatinine 0.44 - 1.00 mg/dL 9.12  9.23  9.20   Sodium 135 - 145 mmol/L 139  142  140  Potassium 3.5 - 5.1 mmol/L 4.1  3.4  3.7   Chloride 98 - 111 mmol/L 106  103  104   CO2 22 - 32 mmol/L 23  24  25    Calcium  8.9 - 10.3 mg/dL 8.6  9.5  9.0       Family Communication: Discussed with patient's husband at bedside  Disposition: Status is: Inpatient  Time spent: 51 minutes  Author: Drue ONEIDA Potter, MD 12/31/2023 4:24 PM  For on call review [email or web address].

## 2023-12-31 NOTE — Progress Notes (Signed)
 Progress Note  Patient Name: [name] Date of Encounter: 12/31/2023  Primary Cardiologist: Deatrice Cage, MD  Primary Electrophysiologist:  OLE ONEIDA HOLTS, MD  Subjective   She remains in atrial flutter with variable AV block with improvement in ventricular rates in the 60s bpm.  Diltiazem  drip was stopped earlier this morning.  Hemodynamically stable.  No symptoms of chest pain, dyspnea, palpitations, dizziness, presyncope, or syncope.  Hemoglobin low at 9.7, down from 12.5 at admission.  Denies symptoms of bleeding.  Inpatient Medications    Scheduled Meds:  apixaban   5 mg Oral BID   aspirin  EC  81 mg Oral Daily   atorvastatin   10 mg Oral QHS   fluticasone  furoate-vilanterol  1 puff Inhalation Daily   levothyroxine   100 mcg Oral QAC breakfast   pantoprazole   40 mg Oral Daily   Continuous Infusions:  diltiazem  (CARDIZEM ) infusion Stopped (12/31/23 0823)   PRN Meds: acetaminophen  **OR** acetaminophen , albuterol , metoprolol  tartrate, ondansetron  **OR** ondansetron  (ZOFRAN ) IV, oxyCODONE , senna-docusate   Vital Signs    Vitals:   12/31/23 0628 12/31/23 0700 12/31/23 0800 12/31/23 0815  BP: 136/75 114/73 114/77   Pulse: 89 67 67 68  Resp: 18 15 16 14   Temp: 98.4 F (36.9 C)     TempSrc: Oral     SpO2: 100% 100% 95% 99%  Weight:      Height:        Intake/Output Summary (Last 24 hours) at 12/31/2023 0834 Last data filed at 12/30/2023 1705 Gross per 24 hour  Intake 1000 ml  Output --  Net 1000 ml   Filed Weights   12/30/23 1236  Weight: 68 kg    Telemetry    Atrial flutter with variable AV block with ventricular rates in the 60s bpm - Personally Reviewed  ECG    No new tracings - Personally Reviewed  Physical Exam   GEN: No acute distress.   Neck: No JVD. Cardiac: Irregularly irregular, no murmurs, rubs, or gallops.  Respiratory: Clear to auscultation bilaterally.  GI: Soft, nontender, non-distended.   MS: No edema; No deformity. Neuro:   Alert and oriented x 3; Nonfocal.  Psych: Normal affect.  Labs    Chemistry Recent Labs  Lab 12/30/23 1241 12/31/23 0620  NA 142 139  K 3.4* 4.1  CL 103 106  CO2 24 23  GLUCOSE 171* 141*  BUN 16 18  CREATININE 0.76 0.87  CALCIUM  9.5 8.6*  GFRNONAA >60 >60  ANIONGAP 15 10     Hematology Recent Labs  Lab 12/30/23 1241 12/31/23 0620  WBC 6.5 5.2  RBC 5.64* 4.15  HGB 12.5 9.7*  HCT 41.9 31.2*  MCV 74.3* 75.2*  MCH 22.2* 23.4*  MCHC 29.8* 31.1  RDW 15.8* 15.9*  PLT 288 214    Cardiac EnzymesNo results for input(s): TROPONINI in the last 168 hours. No results for input(s): TROPIPOC in the last 168 hours.   BNPNo results for input(s): BNP, PROBNP in the last 168 hours.   DDimer No results for input(s): DDIMER in the last 168 hours.   Radiology    DG Chest 2 View Result Date: 12/30/2023 IMPRESSION: 1. No acute cardiopulmonary abnormality. Electronically signed by: Lynwood Seip MD 12/30/2023 02:17 PM EDT RP Workstation: [id number]    Cardiac Studies   2D echo 06/07/2023: 1. Left ventricular ejection fraction, by estimation, is 60 to 65%. The  left ventricle has normal function. The left ventricle has no regional  wall motion abnormalities. There is mild asymmetric  left ventricular  hypertrophy of the basal-septal segment.  Left ventricular diastolic parameters are indeterminate.   2. Right ventricular systolic function is normal. The right ventricular  size is normal. Tricuspid regurgitation signal is inadequate for assessing  PA pressure.   3. Left atrial size was severely dilated.   4. Right atrial size was mildly dilated.   5. The mitral valve is normal in structure. Trivial mitral valve  regurgitation. No evidence of mitral stenosis.   6. The aortic valve is normal in structure. Aortic valve regurgitation is  not visualized. Aortic valve sclerosis is present, with no evidence of  aortic valve stenosis.   7. The inferior vena cava is normal in  size with greater than 50%  respiratory variability, suggesting right atrial pressure of 3 mmHg.  __________   Coronary morphology 05/16/2023: 1. There is normal pulmonary vein drainage into the left atrium. (2 on the right and 2 on the left via a common trunk on left) with ostial measurements as above.   2. The left atrial appendage is a Windsock-cactus type with two lobes and ostial size 38 x 27 mm and length 46 mm, Area 74 mm2. There is no thrombus in the left atrial appendage.   3. The esophagus runs in the left atrial midline and is not in the proximity to any of the pulmonary veins.   4. Coronary calcium  score 672. This is 94th percentile for age/gender. __________   2D echo 10/20/2022: 1. Left ventricular ejection fraction, by estimation, is 55 to 60%. The  left ventricle has normal function. The left ventricle has no regional  wall motion abnormalities. Left ventricular diastolic parameters are  consistent with Grade II diastolic  dysfunction (pseudonormalization). Elevated left atrial pressure. The  average left ventricular global longitudinal strain is -12.7 %. The global  longitudinal strain is abnormal.   2. Right ventricular systolic function is normal. The right ventricular  size is normal. Mildly increased right ventricular wall thickness. There  is normal pulmonary artery systolic pressure.   3. Left atrial size was severely dilated.   4. Right atrial size was moderately dilated.   5. A small pericardial effusion is present. The pericardial effusion is  localized near the right atrium and anterior to the right ventricle.   6. The mitral valve is grossly normal. Mild to moderate mitral valve  regurgitation. No evidence of mitral stenosis.   7. The aortic valve was not well visualized. Aortic valve regurgitation  is not visualized. No aortic stenosis is present.   8. The inferior vena cava is normal in size with <50% respiratory  variability, suggesting right atrial  pressure of 8 mmHg.  __________   Zio patch 09/2022: Monitor 1 Patient had a min HR of 38 bpm, max HR of 174 bpm, and avg HR of 57 bpm. Predominant underlying rhythm was Sinus Rhythm. 10 Supraventricular Tachycardia runs occurred, the run with the fastest interval lasting 15 beats with a max rate of 174 bpm (avg 138  bpm); the run with the fastest interval was also the longest. Idioventricular Rhythm was present.  Rare PACs and rare PVCs.   Monitor 2 Patient had a min HR of 35 bpm, max HR of 203 bpm, and avg HR of 58 bpm. Predominant underlying rhythm was Sinus Rhythm. 1 run of Ventricular Tachycardia occurred lasting 5 beats with a max rate of 133 bpm (avg 128 bpm). 20 Supraventricular Tachycardia  runs occurred, the run with the fastest interval lasting 5 beats with a  max rate of 203 bpm, the longest lasting 15 beats with an avg rate of 100 bpm.  Rare PACs and rare PVCs. __________   2D echo 08/23/2019: 1. Left ventricular ejection fraction, by estimation, is 55 to 60%. The  left ventricle has normal function. The left ventricle has no regional  wall motion abnormalities. Left ventricular diastolic parameters are  consistent with Grade II diastolic  dysfunction (pseudonormalization).   2. Right ventricular systolic function is normal. The right ventricular  size is normal. There is moderately elevated pulmonary artery systolic  pressure.   3. Left atrial size was severely dilated.   4. The mitral valve is normal in structure. Mild to moderate mitral valve  regurgitation.  ___________   24-hour Holter 11/2015: Overall rhythm was sinus. Heart rate ranged from 45-127 BPM, average of 62 BPM. The maximum heart rate was actually a run of atrial tachycardia, as described below. Longest RR interval was normal at 1.76 seconds.   Supraventricular ectopy: 193 isolated PACs. 11 atrial couplets. 3 runs totaling 26 beats - 17 beats run of likely atrial tachycardia at 128 BPM as the longest. The  fastest was a 5 beat run of likely atrial tachycardia at 154 BPM.   Ventricular ectopy: 80 isolated PVCs. 2 ventricular couplets. No ventricular runs.   No documented atrial fibrillation. __________   Lexiscan  MPI 09/25/2015: Rhythm remained in atrial flutter throughout the study. There was no ST segment deviation noted during stress. Myocardial perfusion imaging revealed normal perfusion on stress and rest. No evidence of ischemia. This is a low risk study. Nuclear stress EF: 47%. Please verify with echocardiogram. __________   2D echo 09/23/2015: - Left ventricle: The cavity size was normal. Wall thickness was    increased in a pattern of moderate LVH. Systolic function was    normal. The estimated ejection fraction was in the range of 50%    to 55%. Wall motion was normal; there were no regional wall    motion abnormalities.  - Mitral valve: Moderately calcified annulus.  - Left atrium: The atrium was mildly dilated.  - Right ventricle: The cavity size was mildly dilated.  - Right atrium: The atrium was moderately to severely dilated.  - Pericardium, extracardiac: A trivial pericardial effusion was    identified.   Patient Profile     73 y.o. female with history of CAD noted on CT imaging, atrial flutter ablation in 2017, atrial flutter status post ablation on 01/16/2016, A-fib status post catheter ablation on 05/20/2023, bradycardia, mild to moderate mitral regurgitation, HTN with prior angioedema on lisinopril , PAD s/p extensive intervention to the right lower extremity in 06/2023 and a left lower extremity in 11/2023 followed by vascular surgery, and LVH  who is being seen today for the evaluation of atrial flutter at the request of Dr. Sherre.   Assessment & Plan    1. Atrial flutter with RVR with history of A-fib status post ablation in 05/2023: - Status post atrial flutter ablation in 01/2016 and A-fib ablation in 05/2023 - Admitted with atrial flutter with RVR - Remains in atrial  flutter with variable AV block with improvement in ventricular rates in the 60s bpm leading diltiazem  drip to be discontinued earlier this morning  - Start Cardizem  CD 120 mg daily - Continue apixaban  5 mg twice daily (she does not meet reduced dosing criteria) and she confirms not missing any doses, taking this twice daily every day for at least the past month - Ambulate to assess for  ventricular rate control and symptoms - Magnesium at goal; potassium repleted - TSH suppressed - If she remains in atrial flutter on 10/20, will plan for DCCV and EP evaluation   2.  CAD involving the native coronary arteries without angina: - Never with symptoms of angina - Initial high-sensitivity troponin negative, repeat delta - Obtain echo as ventricular rates improved to evaluate for new cardiomyopathy - Has historically been maintained on apixaban  in lieu of aspirin , though now given recent vascular surgery intervention that she is on dual therapy with DOAC and aspirin  - PTA atorvastatin  10 mg   3.  HTN with LVH: - Blood pressure currently soft on low-dose diltiazem  drip - Hold PTA amlodipine , losartan , and as needed Toprol    4.  Hypokalemia: - Repleted - Magnesium at goal   5.  PAD: - Aspirin  and statin - Management per vascular surgery  6.  Normocytic anemia: - Denies symptoms of bleeding - Query iatrogenic - Monitor  7.  Hypothyroidism: - TSH suppressed - Currently on levothyroxine  100 mcg daily - Obtain free T4 and free T3 - Likely needs dose reduction in levothyroxine ; defer to IM - Possibly contributing to atrial flutter       For questions or updates, please contact [facility] Please consult [email or web address] for contact info under Cardiology/STEMI.    Signed, Bernardino Bring, PA-C [facility] Pager: [phone] 12/31/2023, 8:34 AM

## 2023-12-31 NOTE — ED Notes (Signed)
 Admitting  at bedside.

## 2023-12-31 NOTE — Plan of Care (Signed)

## 2024-01-01 DIAGNOSIS — I484 Atypical atrial flutter: Secondary | ICD-10-CM | POA: Diagnosis not present

## 2024-01-01 DIAGNOSIS — I4891 Unspecified atrial fibrillation: Secondary | ICD-10-CM | POA: Diagnosis not present

## 2024-01-01 DIAGNOSIS — F17200 Nicotine dependence, unspecified, uncomplicated: Secondary | ICD-10-CM

## 2024-01-01 DIAGNOSIS — E782 Mixed hyperlipidemia: Secondary | ICD-10-CM | POA: Diagnosis not present

## 2024-01-01 LAB — CBC WITH DIFFERENTIAL/PLATELET
Abs Immature Granulocytes: 0.02 K/uL (ref 0.00–0.07)
Basophils Absolute: 0 K/uL (ref 0.0–0.1)
Basophils Relative: 1 %
Eosinophils Absolute: 0.1 K/uL (ref 0.0–0.5)
Eosinophils Relative: 2 %
HCT: 41.7 % (ref 36.0–46.0)
Hemoglobin: 12.5 g/dL (ref 12.0–15.0)
Immature Granulocytes: 0 %
Lymphocytes Relative: 27 %
Lymphs Abs: 1.8 K/uL (ref 0.7–4.0)
MCH: 22.6 pg — ABNORMAL LOW (ref 26.0–34.0)
MCHC: 30 g/dL (ref 30.0–36.0)
MCV: 75.5 fL — ABNORMAL LOW (ref 80.0–100.0)
Monocytes Absolute: 0.6 K/uL (ref 0.1–1.0)
Monocytes Relative: 9 %
Neutro Abs: 4.1 K/uL (ref 1.7–7.7)
Neutrophils Relative %: 61 %
Platelets: 237 K/uL (ref 150–400)
RBC: 5.52 MIL/uL — ABNORMAL HIGH (ref 3.87–5.11)
RDW: 16 % — ABNORMAL HIGH (ref 11.5–15.5)
WBC: 6.6 K/uL (ref 4.0–10.5)
nRBC: 0 % (ref 0.0–0.2)

## 2024-01-01 LAB — MAGNESIUM: Magnesium: 2.1 mg/dL (ref 1.7–2.4)

## 2024-01-01 LAB — BASIC METABOLIC PANEL WITH GFR
Anion gap: 8 (ref 5–15)
BUN: 17 mg/dL (ref 8–23)
CO2: 26 mmol/L (ref 22–32)
Calcium: 8.9 mg/dL (ref 8.9–10.3)
Chloride: 107 mmol/L (ref 98–111)
Creatinine, Ser: 0.79 mg/dL (ref 0.44–1.00)
GFR, Estimated: >60 mL/min (ref >60)
Glucose, Bld: 91 mg/dL (ref 70–99)
Potassium: 4.2 mmol/L (ref 3.5–5.1)
Sodium: 141 mmol/L (ref 135–145)

## 2024-01-01 LAB — PHOSPHORUS: Phosphorus: 3.9 mg/dL (ref 2.5–4.6)

## 2024-01-01 LAB — VITAMIN D 25 HYDROXY (VIT D DEFICIENCY, FRACTURES): Vit D, 25-Hydroxy: 19.56 ng/mL — ABNORMAL LOW (ref 30–100)

## 2024-01-01 MED ORDER — HYDROCORTISONE 1 % EX CREA
TOPICAL_CREAM | Freq: Three times a day (TID) | CUTANEOUS | Status: DC | PRN
Start: 2024-01-01 — End: 2024-01-02
  Filled 2024-01-01: qty 28

## 2024-01-01 MED ORDER — VITAMIN D (ERGOCALCIFEROL) 1.25 MG (50000 UNIT) PO CAPS
50000.0000 [IU] | ORAL_CAPSULE | ORAL | Status: DC
  Administered 2024-01-02: 50000 [IU] via ORAL
  Filled 2024-01-01 (×3): qty 1

## 2024-01-01 NOTE — Progress Notes (Signed)
 [facility] Progress Note  Patient: [name]    FMW:[id number]  DOA: 12/30/2023     Date of Service: the patient was seen and examined on 01/01/2024  Chief Complaint  Patient presents with   Palpitations   Brief hospital course:    Ms. Aarilyn Dye is a 48 female with history of atrial fibrillation on Eliquis , status post multiple ablation, as needed metoprolol  due to heart rate going in the 40s, hypothyroid, GERD, hypertension, hyperlipidemia, atherosclerosis of the native cardiac arteries, history of stent placement, who presents ED for chief concerns of heart palpitation.  Assessment and Plan:  Acute atrial flutter Presented with palpitations Feeling as needed home therapy, metoprolol  12.5 mg p.o. as prescribed by outpatient cardiology Patient reports her heart rate was in the 180s and was the same when EMS arrived Patient required diltiazem  drip upon presentation this has been weaned off Patient initiated on oral Cardizem  CD1 20 mg daily Continue Eliquis  Continue telemetry Cardiology consulted, recommended to keep n.p.o. after midnight, possible cardioversion tomorrow a.m.    PAD (peripheral artery disease) Home aspirin  81 mg daily and Eliquis  5 mg p.o. twice daily resumed   Hyperlipidemia Atorvastatin  10 mg nightly resumed   Hypothyroidism, unspecified Levothyroxine  100 mcg   Tobacco use disorder-resolved as of 07/14/2019 As needed nicotine  patch ordered  # Vitamin D  deficiency: started vitamin D  50,000 units p.o. weekly, follow with PCP to repeat vitamin D  level after 3 to 6 months.   Body mass index is 24.96 kg/m.  Interventions:  Diet: Heart healthy DVT Prophylaxis: Eliquis   Advance goals of care discussion: Full code  Family Communication: family was present at bedside, at the time of interview.  The pt provided permission to discuss medical plan with the family. Opportunity was given to ask question and all questions were answered  satisfactorily.   Disposition:  Pt is from home, admitted with atrial flutter/A-fib, still has irregular rhythm, which precludes a safe discharge. Discharge to home, when stable and cleared by cardiology, most likely in 1 to 2 days.  Subjective: No significant events overnight, patient denies any chest pain or palpitations, no shortness of breath.  Physical Exam: General: NAD, lying comfortably Appear in no distress, affect appropriate Eyes: PERRLA ENT: Oral Mucosa Clear, moist  Neck: no JVD,  Cardiovascular: Irregular rhythm, S1 and S2 Present, no Murmur,  Respiratory: good respiratory effort, Bilateral Air entry equal and Decreased, no Crackles, no wheezes Abdomen: Bowel Sound present, Soft and no tenderness,  Skin: no rashes Extremities: no Pedal edema, no calf tenderness Neurologic: without any new focal findings Gait not checked due to patient safety concerns  Vitals:   01/01/24 0506 01/01/24 0507 01/01/24 0807 01/01/24 1256  BP: 94/69 115/72 (!) 146/89 (!) 97/52  Pulse: 69 69 69 69  Resp: 20 20 18 18   Temp: 97.8 F (36.6 C) 97.8 F (36.6 C) 97.7 F (36.5 C) 97.8 F (36.6 C)  TempSrc: Oral Oral    SpO2: 100% 100% 100% 100%  Weight:      Height:        Intake/Output Summary (Last 24 hours) at 01/01/2024 1354 Last data filed at 01/01/2024 1017 Gross per 24 hour  Intake 0 ml  Output --  Net 0 ml   Filed Weights   12/30/23 1236  Weight: 68 kg    Data Reviewed: I have personally reviewed and interpreted daily labs, tele strips, imagings as discussed above. I reviewed all nursing notes, pharmacy notes, vitals, pertinent old records I have  discussed plan of care as described above with RN and patient/family.  CBC: Recent Labs  Lab 12/30/23 1241 12/31/23 0620 12/31/23 1141 01/01/24 0600  WBC 6.5 5.2  --  6.6  NEUTROABS  --   --   --  4.1  HGB 12.5 9.7* 12.9 12.5  HCT 41.9 31.2* 42.6 41.7  MCV 74.3* 75.2*  --  75.5*  PLT 288 214  --  237   Basic  Metabolic Panel: Recent Labs  Lab 12/30/23 1241 12/31/23 0620 01/01/24 0600  NA 142 139 141  K 3.4* 4.1 4.2  CL 103 106 107  CO2 24 23 26   GLUCOSE 171* 141* 91  BUN 16 18 17   CREATININE 0.76 0.87 0.79  CALCIUM  9.5 8.6* 8.9  MG 2.2  --  2.1  PHOS  --   --  3.9    Studies: No results found.  Scheduled Meds:  apixaban   5 mg Oral BID   aspirin  EC  81 mg Oral Daily   atorvastatin   10 mg Oral QHS   diltiazem   120 mg Oral Daily   fluticasone  furoate-vilanterol  1 puff Inhalation Daily   levothyroxine   100 mcg Oral QAC breakfast   pantoprazole   40 mg Oral Daily   Continuous Infusions: PRN Meds: acetaminophen  **OR** acetaminophen , albuterol , hydrocortisone cream, metoprolol  tartrate, ondansetron  **OR** ondansetron  (ZOFRAN ) IV, oxyCODONE , senna-docusate  Time spent: 35 minutes  Author: ELVAN SOR. MD [facility] 01/01/2024 1:54 PM  To reach On-call, see care teams to locate the attending and reach out to them via [email or web address]. If 7PM-7AM, please contact night-coverage If you still have difficulty reaching the attending provider, please page the [facility] (Director on Call) for [facility] on amion for assistance.

## 2024-01-01 NOTE — Plan of Care (Signed)
  Problem: Clinical Measurements: Goal: Respiratory complications will improve Outcome: Progressing Goal: Cardiovascular complication will be avoided Outcome: Progressing   Problem: Coping: Goal: Level of anxiety will decrease Outcome: Progressing   Problem: Elimination: Goal: Will not experience complications related to bowel motility Outcome: Progressing

## 2024-01-01 NOTE — Progress Notes (Signed)
 Progress Note  Patient Name: [name] Date of Encounter: 01/01/2024  Primary Cardiologist: Deatrice Cage, MD  Primary Electrophysiologist:  OLE ONEIDA HOLTS, MD  Subjective   She remains in atrial flutter with variable AV block with improvement in ventricular rates in the 60s bpm on Cardizem  CD 120 mg. She did note return of palpitations yesterday afternoon with ambulation.  Hemodynamically stable.  No symptoms of chest pain, dyspnea, dizziness, presyncope, or syncope.    Inpatient Medications    Scheduled Meds:  apixaban   5 mg Oral BID   aspirin  EC  81 mg Oral Daily   atorvastatin   10 mg Oral QHS   diltiazem   120 mg Oral Daily   fluticasone  furoate-vilanterol  1 puff Inhalation Daily   levothyroxine   100 mcg Oral QAC breakfast   pantoprazole   40 mg Oral Daily   Continuous Infusions:   PRN Meds: acetaminophen  **OR** acetaminophen , albuterol , metoprolol  tartrate, ondansetron  **OR** ondansetron  (ZOFRAN ) IV, oxyCODONE , senna-docusate   Vital Signs    Vitals:   12/31/23 2147 01/01/24 0040 01/01/24 0506 01/01/24 0507  BP: 114/81 130/77 94/69 115/72  Pulse: 66 70 69 69  Resp:  20 20 20   Temp:  98 F (36.7 C) 97.8 F (36.6 C) 97.8 F (36.6 C)  TempSrc:  Oral Oral Oral  SpO2: 100% 100% 100% 100%  Weight:      Height:       No intake or output data in the 24 hours ending 01/01/24 0740  Filed Weights   12/30/23 1236  Weight: 68 kg    Telemetry    Atrial flutter with variable AV block with ventricular rates in the 60s bpm - Personally Reviewed  ECG    No new tracings - Personally Reviewed  Physical Exam   GEN: No acute distress.   Neck: No JVD. Cardiac: Irregularly irregular, no murmurs, rubs, or gallops.  Respiratory: Clear to auscultation bilaterally.  GI: Soft, nontender, non-distended.   MS: No edema; No deformity. Neuro:  Alert and oriented x 3; Nonfocal.  Psych: Normal affect.  Labs    Chemistry Recent Labs  Lab 12/30/23 1241  12/31/23 0620 01/01/24 0600  NA 142 139 141  K 3.4* 4.1 4.2  CL 103 106 107  CO2 24 23 26   GLUCOSE 171* 141* 91  BUN 16 18 17   CREATININE 0.76 0.87 0.79  CALCIUM  9.5 8.6* 8.9  GFRNONAA >60 >60 >60  ANIONGAP 15 10 8      Hematology Recent Labs  Lab 12/30/23 1241 12/31/23 0620 12/31/23 1141 01/01/24 0600  WBC 6.5 5.2  --  6.6  RBC 5.64* 4.15  --  5.52*  HGB 12.5 9.7* 12.9 12.5  HCT 41.9 31.2* 42.6 41.7  MCV 74.3* 75.2*  --  75.5*  MCH 22.2* 23.4*  --  22.6*  MCHC 29.8* 31.1  --  30.0  RDW 15.8* 15.9*  --  16.0*  PLT 288 214  --  237    Cardiac EnzymesNo results for input(s): TROPONINI in the last 168 hours. No results for input(s): TROPIPOC in the last 168 hours.   BNPNo results for input(s): BNP, PROBNP in the last 168 hours.   DDimer No results for input(s): DDIMER in the last 168 hours.   Radiology    DG Chest 2 View Result Date: 12/30/2023 IMPRESSION: 1. No acute cardiopulmonary abnormality. Electronically signed by: Lynwood Seip MD 12/30/2023 02:17 PM EDT RP Workstation: [id number]    Cardiac Studies   2D echo 06/07/2023: 1. Left ventricular  ejection fraction, by estimation, is 60 to 65%. The  left ventricle has normal function. The left ventricle has no regional  wall motion abnormalities. There is mild asymmetric left ventricular  hypertrophy of the basal-septal segment.  Left ventricular diastolic parameters are indeterminate.   2. Right ventricular systolic function is normal. The right ventricular  size is normal. Tricuspid regurgitation signal is inadequate for assessing  PA pressure.   3. Left atrial size was severely dilated.   4. Right atrial size was mildly dilated.   5. The mitral valve is normal in structure. Trivial mitral valve  regurgitation. No evidence of mitral stenosis.   6. The aortic valve is normal in structure. Aortic valve regurgitation is  not visualized. Aortic valve sclerosis is present, with no evidence of  aortic  valve stenosis.   7. The inferior vena cava is normal in size with greater than 50%  respiratory variability, suggesting right atrial pressure of 3 mmHg.  __________   Coronary morphology 05/16/2023: 1. There is normal pulmonary vein drainage into the left atrium. (2 on the right and 2 on the left via a common trunk on left) with ostial measurements as above.   2. The left atrial appendage is a Windsock-cactus type with two lobes and ostial size 38 x 27 mm and length 46 mm, Area 74 mm2. There is no thrombus in the left atrial appendage.   3. The esophagus runs in the left atrial midline and is not in the proximity to any of the pulmonary veins.   4. Coronary calcium  score 672. This is 94th percentile for age/gender. __________   2D echo 10/20/2022: 1. Left ventricular ejection fraction, by estimation, is 55 to 60%. The  left ventricle has normal function. The left ventricle has no regional  wall motion abnormalities. Left ventricular diastolic parameters are  consistent with Grade II diastolic  dysfunction (pseudonormalization). Elevated left atrial pressure. The  average left ventricular global longitudinal strain is -12.7 %. The global  longitudinal strain is abnormal.   2. Right ventricular systolic function is normal. The right ventricular  size is normal. Mildly increased right ventricular wall thickness. There  is normal pulmonary artery systolic pressure.   3. Left atrial size was severely dilated.   4. Right atrial size was moderately dilated.   5. A small pericardial effusion is present. The pericardial effusion is  localized near the right atrium and anterior to the right ventricle.   6. The mitral valve is grossly normal. Mild to moderate mitral valve  regurgitation. No evidence of mitral stenosis.   7. The aortic valve was not well visualized. Aortic valve regurgitation  is not visualized. No aortic stenosis is present.   8. The inferior vena cava is normal in size  with <50% respiratory  variability, suggesting right atrial pressure of 8 mmHg.  __________   Zio patch 09/2022: Monitor 1 Patient had a min HR of 38 bpm, max HR of 174 bpm, and avg HR of 57 bpm. Predominant underlying rhythm was Sinus Rhythm. 10 Supraventricular Tachycardia runs occurred, the run with the fastest interval lasting 15 beats with a max rate of 174 bpm (avg 138  bpm); the run with the fastest interval was also the longest. Idioventricular Rhythm was present.  Rare PACs and rare PVCs.   Monitor 2 Patient had a min HR of 35 bpm, max HR of 203 bpm, and avg HR of 58 bpm. Predominant underlying rhythm was Sinus Rhythm. 1 run of Ventricular Tachycardia occurred lasting  5 beats with a max rate of 133 bpm (avg 128 bpm). 20 Supraventricular Tachycardia  runs occurred, the run with the fastest interval lasting 5 beats with a max rate of 203 bpm, the longest lasting 15 beats with an avg rate of 100 bpm.  Rare PACs and rare PVCs. __________   2D echo 08/23/2019: 1. Left ventricular ejection fraction, by estimation, is 55 to 60%. The  left ventricle has normal function. The left ventricle has no regional  wall motion abnormalities. Left ventricular diastolic parameters are  consistent with Grade II diastolic  dysfunction (pseudonormalization).   2. Right ventricular systolic function is normal. The right ventricular  size is normal. There is moderately elevated pulmonary artery systolic  pressure.   3. Left atrial size was severely dilated.   4. The mitral valve is normal in structure. Mild to moderate mitral valve  regurgitation.  ___________   24-hour Holter 11/2015: Overall rhythm was sinus. Heart rate ranged from 45-127 BPM, average of 62 BPM. The maximum heart rate was actually a run of atrial tachycardia, as described below. Longest RR interval was normal at 1.76 seconds.   Supraventricular ectopy: 193 isolated PACs. 11 atrial couplets. 3 runs totaling 26 beats - 17 beats run of  likely atrial tachycardia at 128 BPM as the longest. The fastest was a 5 beat run of likely atrial tachycardia at 154 BPM.   Ventricular ectopy: 80 isolated PVCs. 2 ventricular couplets. No ventricular runs.   No documented atrial fibrillation. __________   Lexiscan  MPI 09/25/2015: Rhythm remained in atrial flutter throughout the study. There was no ST segment deviation noted during stress. Myocardial perfusion imaging revealed normal perfusion on stress and rest. No evidence of ischemia. This is a low risk study. Nuclear stress EF: 47%. Please verify with echocardiogram. __________   2D echo 09/23/2015: - Left ventricle: The cavity size was normal. Wall thickness was    increased in a pattern of moderate LVH. Systolic function was    normal. The estimated ejection fraction was in the range of 50%    to 55%. Wall motion was normal; there were no regional wall    motion abnormalities.  - Mitral valve: Moderately calcified annulus.  - Left atrium: The atrium was mildly dilated.  - Right ventricle: The cavity size was mildly dilated.  - Right atrium: The atrium was moderately to severely dilated.  - Pericardium, extracardiac: A trivial pericardial effusion was    identified.   Patient Profile     73 y.o. female with history of CAD noted on CT imaging, atrial flutter ablation in 2017, atrial flutter status post ablation on 01/16/2016, A-fib status post catheter ablation on 05/20/2023, bradycardia, mild to moderate mitral regurgitation, HTN with prior angioedema on lisinopril , PAD s/p extensive intervention to the right lower extremity in 06/2023 and a left lower extremity in 11/2023 followed by vascular surgery, and LVH  who is being seen today for the evaluation of atrial flutter at the request of Dr. Sherre.   Assessment & Plan    1. Atrial flutter with RVR with history of A-fib status post ablation in 05/2023: - Status post atrial flutter ablation in 01/2016 and A-fib ablation in 05/2023 -  Admitted with atrial flutter with RVR - Remains in atrial flutter with variable AV block with improvement in ventricular rates in the 60s bpm   - Continue newly started Cardizem  CD 120 mg daily - Continue apixaban  5 mg twice daily (she does not meet reduced dosing criteria) and  she confirms not missing any doses, taking this twice daily every day for at least the past month - Magnesium at goal; potassium repleted - TSH suppressed - NPO at midnight - If she remains in atrial flutter on 10/20, will plan for DCCV and EP evaluation (latter could be pursued as an outpatient)   2.  CAD involving the native coronary arteries without angina: - Never with symptoms of angina - Initial high-sensitivity troponin negative, repeat delta - Obtain echo as ventricular rates improved to evaluate for new cardiomyopathy - Has historically been maintained on apixaban  in lieu of aspirin , though now given recent vascular surgery intervention that she is on dual therapy with DOAC and aspirin  - PTA atorvastatin  10 mg   3.  HTN with LVH: - Blood pressure well controlled on Cardizem  CD 120 mg - Hold PTA amlodipine , losartan , and as needed Toprol  for now   4.  Hypokalemia: - Repleted - Magnesium at goal   5.  PAD: - Aspirin  and statin - Management per vascular surgery  6.  Hypothyroidism: - TSH suppressed - Currently on levothyroxine  100 mcg daily - Free T4 normal     Informed Consent   Shared Decision Making/Informed Consent{  The risks (stroke, cardiac arrhythmias rarely resulting in the need for a temporary or permanent pacemaker, skin irritation or burns and complications associated with conscious sedation including aspiration, arrhythmia, respiratory failure and death), benefits (restoration of normal sinus rhythm) and alternatives of a direct current cardioversion were explained in detail to Ms. Andis and she agrees to proceed.        For questions or updates, please contact [facility] Please consult [email or web address] for contact info under Cardiology/STEMI.    Signed, Bernardino Bring, PA-C [facility] Pager: [phone] 01/01/2024, 7:40 AM

## 2024-01-01 NOTE — Plan of Care (Signed)
  Problem: Education: Goal: Knowledge of General Education information will improve Description: Including pain rating scale, medication(s)/side effects and non-pharmacologic comfort measures Outcome: Progressing   Problem: Health Behavior/Discharge Planning: Goal: Ability to manage health-related needs will improve Outcome: Progressing   Problem: Clinical Measurements: Goal: Ability to maintain clinical measurements within normal limits will improve Outcome: Progressing Goal: Respiratory complications will improve Outcome: Adequate for Discharge Goal: Cardiovascular complication will be avoided Outcome: Not Progressing

## 2024-01-02 ENCOUNTER — Ambulatory Visit: Admitting: Physical Therapy

## 2024-01-02 ENCOUNTER — Inpatient Hospital Stay: Admitting: General Practice

## 2024-01-02 ENCOUNTER — Inpatient Hospital Stay (HOSPITAL_COMMUNITY)
Admit: 2024-01-02 | Discharge: 2024-01-02 | Disposition: A | Discharge status: Home / Self Care | Attending: Physician Assistant | Admitting: Physician Assistant

## 2024-01-02 ENCOUNTER — Other Ambulatory Visit: Payer: Self-pay

## 2024-01-02 ENCOUNTER — Encounter
Admission: EM | Disposition: A | Discharge status: Home / Self Care | Payer: Self-pay | Source: Home / Self Care | Attending: Student

## 2024-01-02 DIAGNOSIS — I4892 Unspecified atrial flutter: Secondary | ICD-10-CM

## 2024-01-02 DIAGNOSIS — I480 Paroxysmal atrial fibrillation: Secondary | ICD-10-CM

## 2024-01-02 DIAGNOSIS — I10 Essential (primary) hypertension: Secondary | ICD-10-CM

## 2024-01-02 DIAGNOSIS — R002 Palpitations: Secondary | ICD-10-CM

## 2024-01-02 DIAGNOSIS — I4891 Unspecified atrial fibrillation: Secondary | ICD-10-CM

## 2024-01-02 HISTORY — PX: CARDIOVERSION: SHX1299

## 2024-01-02 LAB — BASIC METABOLIC PANEL WITH GFR
Anion gap: 9 (ref 5–15)
BUN: 12 mg/dL (ref 8–23)
CO2: 25 mmol/L (ref 22–32)
Calcium: 8.7 mg/dL — ABNORMAL LOW (ref 8.9–10.3)
Chloride: 107 mmol/L (ref 98–111)
Creatinine, Ser: 0.73 mg/dL (ref 0.44–1.00)
GFR, Estimated: >60 mL/min (ref >60)
Glucose, Bld: 93 mg/dL (ref 70–99)
Potassium: 3.8 mmol/L (ref 3.5–5.1)
Sodium: 141 mmol/L (ref 135–145)

## 2024-01-02 LAB — ECHOCARDIOGRAM LIMITED
Calc EF: 50.9 %
Height: 65 in
Single Plane A2C EF: 54.5 %
Single Plane A4C EF: 49.4 %
Weight: 2400 [oz_av]

## 2024-01-02 LAB — PHOSPHORUS: Phosphorus: 3.6 mg/dL (ref 2.5–4.6)

## 2024-01-02 LAB — CBC
HCT: 37.7 % (ref 36.0–46.0)
Hemoglobin: 11.5 g/dL — ABNORMAL LOW (ref 12.0–15.0)
MCH: 22.6 pg — ABNORMAL LOW (ref 26.0–34.0)
MCHC: 30.5 g/dL (ref 30.0–36.0)
MCV: 74.2 fL — ABNORMAL LOW (ref 80.0–100.0)
Platelets: 241 K/uL (ref 150–400)
RBC: 5.08 MIL/uL (ref 3.87–5.11)
RDW: 15.9 % — ABNORMAL HIGH (ref 11.5–15.5)
WBC: 7.1 K/uL (ref 4.0–10.5)
nRBC: 0 % (ref 0.0–0.2)

## 2024-01-02 LAB — MAGNESIUM: Magnesium: 1.9 mg/dL (ref 1.7–2.4)

## 2024-01-02 LAB — T3, FREE: T3, Free: 2.8 pg/mL (ref 2.0–4.4)

## 2024-01-02 LAB — VITAMIN B12: Vitamin B-12: 588 pg/mL (ref 180–914)

## 2024-01-02 SURGERY — CARDIOVERSION
Anesthesia: General

## 2024-01-02 MED ORDER — PROPOFOL 10 MG/ML IV BOLUS
INTRAVENOUS | Status: DC | PRN
  Administered 2024-01-02: 60 mg via INTRAVENOUS

## 2024-01-02 MED ORDER — SODIUM CHLORIDE 0.9 % IV SOLN: INTRAVENOUS | Status: DC

## 2024-01-02 MED ORDER — DILTIAZEM HCL ER COATED BEADS 120 MG PO CP24
120.0000 mg | ORAL_CAPSULE | Freq: Every day | ORAL | 11 refills | Status: AC
  Filled 2024-01-02 – 2024-01-26 (×2): qty 30, 30d supply, fill #0

## 2024-01-02 MED ORDER — ACETAMINOPHEN 325 MG PO TABS: 650.0000 mg | ORAL_TABLET | Freq: Four times a day (QID) | ORAL | Status: DC | PRN

## 2024-01-02 MED ORDER — ATORVASTATIN CALCIUM 20 MG PO TABS
40.0000 mg | ORAL_TABLET | Freq: Every day | ORAL | Status: DC
Start: 2024-01-02 — End: 2024-01-02

## 2024-01-02 MED ORDER — PROPOFOL 1000 MG/100ML IV EMUL
INTRAVENOUS | Status: AC
  Filled 2024-01-02: qty 100

## 2024-01-02 MED ORDER — VITAMIN D (ERGOCALCIFEROL) 1.25 MG (50000 UNIT) PO CAPS
50000.0000 [IU] | ORAL_CAPSULE | ORAL | 0 refills | Status: AC
  Filled 2024-01-02: qty 12, 84d supply, fill #0

## 2024-01-02 MED ORDER — ACETAMINOPHEN 650 MG RE SUPP: 650.0000 mg | Freq: Four times a day (QID) | RECTAL | Status: DC | PRN

## 2024-01-02 NOTE — Transfer of Care (Signed)
 Immediate Anesthesia Transfer of Care Note  Patient: [name]  Procedure(s) Performed: CARDIOVERSION  Patient Location: specials  Anesthesia Type:MAC  Level of Consciousness: awake  Airway & Oxygen Therapy: Patient Spontanous Breathing and Patient connected to nasal cannula oxygen  Post-op Assessment: Report given to RN and Post -op Vital signs reviewed and stable  Post vital signs: Reviewed and stable  Last Vitals:  Vitals Value Taken Time  BP 113/95 01/02/24 12:53  Temp    Pulse 56 01/02/24 12:54  Resp 18 01/02/24 12:55  SpO2 99 % 01/02/24 12:54    Last Pain:  Vitals:   01/02/24 1157  TempSrc: Temporal  PainSc: 0-No pain         Complications: No notable events documented.

## 2024-01-02 NOTE — Progress Notes (Signed)
 Rounding Note   Patient Name: [name] Date of Encounter: 01/02/2024  [facility] Cardiologist: Deatrice Cage, MD   Subjective Remains in atrial flutter with rates well controlled. She is pending DCCV this afternoon. Plan to update echo after procedure.   Scheduled Meds:  apixaban   5 mg Oral BID   aspirin  EC  81 mg Oral Daily   atorvastatin   10 mg Oral QHS   diltiazem   120 mg Oral Daily   fluticasone  furoate-vilanterol  1 puff Inhalation Daily   levothyroxine   100 mcg Oral QAC breakfast   pantoprazole   40 mg Oral Daily   Vitamin D  (Ergocalciferol )  50,000 Units Oral Q7 days   Continuous Infusions:  sodium chloride  20 mL/hr at 01/02/24 0754   PRN Meds: acetaminophen  **OR** acetaminophen , albuterol , hydrocortisone cream, metoprolol  tartrate, ondansetron  **OR** ondansetron  (ZOFRAN ) IV, oxyCODONE , senna-docusate   Vital Signs  Vitals:   01/02/24 0243 01/02/24 0416 01/02/24 0420 01/02/24 0746  BP: 119/67 (!) 111/59 (!) 123/56 116/66  Pulse: 65 64  66  Resp:  20 20 16   Temp: 98 F (36.7 C) 98 F (36.7 C) 98.3 F (36.8 C) 98.1 F (36.7 C)  TempSrc: Oral Oral  Oral  SpO2: 100% 100% 98% 100%  Weight:      Height:        Intake/Output Summary (Last 24 hours) at 01/02/2024 1052 Last data filed at 01/01/2024 1922 Gross per 24 hour  Intake 120 ml  Output --  Net 120 ml      12/30/2023   12:36 PM 12/06/2023    4:37 PM 12/02/2023    2:46 PM  Last 3 Weights  Weight (lbs) 150 lb 162 lb 163 lb  Weight (kg) 68.04 kg 73.483 kg 73.936 kg      Telemetry Atrial flutter rate 60s-70s bpm with brief runs up to 110s bpm - Personally Reviewed  Physical Exam  GEN: No acute distress.   Neck: No JVD Cardiac: IRIR, no murmurs, rubs, or gallops.  Respiratory: Clear to auscultation bilaterally. GI: Soft, nontender, non-distended  MS: No edema; No deformity. Neuro:  Nonfocal  Psych: Normal affect   Labs High Sensitivity Troponin:   Recent Labs  Lab  12/30/23 1241 12/30/23 2300  TROPONINIHS 8 9     Chemistry Recent Labs  Lab 12/30/23 1241 12/31/23 0620 01/01/24 0600 01/02/24 0525  NA 142 139 141 141  K 3.4* 4.1 4.2 3.8  CL 103 106 107 107  CO2 24 23 26 25   GLUCOSE 171* 141* 91 93  BUN 16 18 17 12   CREATININE 0.76 0.87 0.79 0.73  CALCIUM  9.5 8.6* 8.9 8.7*  MG 2.2  --  2.1 1.9  GFRNONAA >60 >60 >60 >60  ANIONGAP 15 10 8 9     Lipids No results for input(s): CHOL, TRIG, HDL, LABVLDL, LDLCALC, CHOLHDL in the last 168 hours.  Hematology Recent Labs  Lab 12/31/23 0620 12/31/23 1141 01/01/24 0600 01/02/24 0525  WBC 5.2  --  6.6 7.1  RBC 4.15  --  5.52* 5.08  HGB 9.7* 12.9 12.5 11.5*  HCT 31.2* 42.6 41.7 37.7  MCV 75.2*  --  75.5* 74.2*  MCH 23.4*  --  22.6* 22.6*  MCHC 31.1  --  30.0 30.5  RDW 15.9*  --  16.0* 15.9*  PLT 214  --  237 241   Thyroid   Recent Labs  Lab 12/30/23 1241 12/31/23 0500  TSH 0.252*  --   FREET4  --  1.00  BNPNo results for input(s): BNP, PROBNP in the last 168 hours.  DDimer No results for input(s): DDIMER in the last 168 hours.   Radiology  No results found.  Cardiac Studies  2D echo 06/07/2023: 1. Left ventricular ejection fraction, by estimation, is 60 to 65%. The  left ventricle has normal function. The left ventricle has no regional  wall motion abnormalities. There is mild asymmetric left ventricular  hypertrophy of the basal-septal segment.  Left ventricular diastolic parameters are indeterminate.   2. Right ventricular systolic function is normal. The right ventricular  size is normal. Tricuspid regurgitation signal is inadequate for assessing  PA pressure.   3. Left atrial size was severely dilated.   4. Right atrial size was mildly dilated.   5. The mitral valve is normal in structure. Trivial mitral valve  regurgitation. No evidence of mitral stenosis.   6. The aortic valve is normal in structure. Aortic valve regurgitation is  not visualized.  Aortic valve sclerosis is present, with no evidence of  aortic valve stenosis.   7. The inferior vena cava is normal in size with greater than 50%  respiratory variability, suggesting right atrial pressure of 3 mmHg.   Patient Profile   72 y.o. female  with history of CAD noted on CT imaging, atrial flutter ablation in 2017, atrial flutter status post ablation on 01/16/2016, A-fib status post catheter ablation on 05/20/2023, bradycardia, mild to moderate mitral regurgitation, HTN with prior angioedema on lisinopril , PAD s/p extensive intervention to the right lower extremity in 06/2023 and a left lower extremity in 11/2023 followed by vascular surgery, and LVH who is being seen for the ongoing management of atrial flutter.   Assessment & Plan   Atrial flutter with RVR Atrial fibrillation s/p ablation 05/2023 - S/p atrial flutter ablation in 2017 and atrial fibrillation ablation in 05/2023 - Admitted with atrial flutter with RVR - Initially started on diltiazem  drip, transitioned to oral diltiazem  yesterday - Remains in atrial flutter with rates well-controlled in the 60s to 70s bpm on average - Continue Eliquis  5 mg twice daily, she has not missed any doses - Plan for DCCV this afternoon - Recommend updating echo once sinus rhythm is restored - Would recommend outpatient follow-up with EP  Coronary artery disease - Never with symptoms of angina - Troponin negative x 2 - Will plan for repeat echo after cardioversion to assess for new cardiomyopathy - She is on dual therapy with DOAC and ASA given recent vascular surgery intervention - Continue PTA atorvastatin  10 mg daily  Hypertension with LVH - Blood pressure well-controlled on current dose of - PTA amlodipine , losartan , and as needed Toprol  are held  Hypokalemia - Repleted, magnesium at goal  PAD - Continue aspirin  and statin therapy - Management per vascular surgery  Hypothyroidism - TSH suppressed - Currently on levothyroxine   100 mcg daily - Free T4 normal  Informed Consent   Shared Decision Making/Informed Consent The risks (stroke, cardiac arrhythmias rarely resulting in the need for a temporary or permanent pacemaker, skin irritation or burns and complications associated with conscious sedation including aspiration, arrhythmia, respiratory failure and death), benefits (restoration of normal sinus rhythm) and alternatives of a direct current cardioversion were explained in detail to Ms. Mazariego and she agrees to proceed.        For questions or updates, please contact [facility] Please consult [email or web address] for contact info under       Signed, Lesley LITTIE Maffucci, PA-C  01/02/2024, 10:52 AM

## 2024-01-02 NOTE — TOC CM/SW Note (Signed)
 Transition of Care [facility]) CM/SW Note    Transition of Care [facility]) - Inpatient Brief Assessment   Patient Details  Name: [name] MRN: [id number] Date of Birth: 03-Mar-1951  Transition of Care [facility]) CM/SW Contact:    Alfonso Rummer, LCSW Phone Number: 01/02/2024, 11:20 AM   Clinical Narrative:  Pt from home. LCSW A. Kevin Space completed TOC chart review. No TOC needs identified please contact TOC should needs arise.   Transition of Care Asessment: Insurance and Status: Insurance coverage has been reviewed Patient has primary care physician: Yes ([facility]) Home environment has been reviewed: single family home   Prior/Current Home Services: No current home services Social Drivers of Health Review: SDOH reviewed no interventions necessary Readmission risk has been reviewed: No Transition of care needs: no transition of care needs at this time

## 2024-01-02 NOTE — Procedures (Signed)
 Cardioversion procedure note For atrial flutter  Procedure Details:  Consent: Risks of procedure as well as the alternatives and risks of each were explained to the (patient/caregiver).  Consent for procedure obtained.  Time Out: Verified patient identification, verified procedure, site/side was marked, verified correct patient position, special equipment/implants available, medications/allergies/relevent history reviewed, required imaging and test results available.  Performed  Patient placed on cardiac monitor, pulse oximetry, supplemental oxygen as necessary.   Sedation given: propofol IV, Dr.  Rosena pads placed anterior and posterior chest.   Cardioverted 1 time(s).   Cardioverted at  150J. Synchronized biphasic Converted to NSR  Evaluation: Findings: Post procedure EKG shows: NSR Complications: None Patient did tolerate procedure well.   Redell Cave, M.D.

## 2024-01-02 NOTE — Anesthesia Preprocedure Evaluation (Signed)
 Anesthesia Evaluation  Patient identified by MRN, date of birth, ID band Patient awake    Reviewed: Allergy & Precautions, NPO status , Patient's Chart, lab work & pertinent test results  History of Anesthesia Complications Negative for: history of anesthetic complications  Airway Mallampati: III  TM Distance: >3 FB Neck ROM: full    Dental  (+) Chipped, Poor Dentition, Missing   Pulmonary asthma , COPD, former smoker   Pulmonary exam normal        Cardiovascular Exercise Tolerance: Good hypertension, (-) angina + CAD and + Peripheral Vascular Disease  + dysrhythmias Atrial Fibrillation      Neuro/Psych negative neurological ROS  negative psych ROS   GI/Hepatic Neg liver ROS,GERD  Controlled,,  Endo/Other  Hypothyroidism    Renal/GU      Musculoskeletal   Abdominal   Peds  Hematology negative hematology ROS (+)   Anesthesia Other Findings Past Medical History: No date: Anemia No date: Arthritis 09/2015: Atrial flutter (HCC)     Comment:  a.) CHADS2VASc 2 (sex, HTN); b.) s/p DCCV 11/11/2015               (150 J); c.) s/p RFCA 01/17/2016; d.) rate/rhythm               maintained without pharmacological intervention; no               chronic anticoagulation. No date: Coronary artery calcification seen on CT scan 08/23/2019: Diastolic dysfunction     Comment:  a.)  TTE 08/23/2019: EF 55-60%, severe LAE, mild-mod MR,              PASP 46.2, G2DD No date: Dyspnea No date: Essential hypertension No date: GERD (gastroesophageal reflux disease) No date: Hepatic steatosis No date: History of kidney stones No date: Incomplete left bundle branch block (LBBB) No date: Paroxysmal A-fib (HCC) No date: Pneumonia     Comment:  as a child No date: Secondary hypercoagulable state (HCC) No date: Subclinical hypothyroidism No date: Tobacco abuse No date: Typical atrial flutter [facility])  Past Surgical History: No date:  ABDOMINAL HYSTERECTOMY 01/16/2016: ABLATION OF DYSRHYTHMIC FOCUS 05/24/2023: ATRIAL FIBRILLATION ABLATION; N/A     Comment:  Procedure: ATRIAL FIBRILLATION ABLATION;  Surgeon:               Cindie Ole DASEN, MD;  [facility]: [facility];                Service: Cardiovascular;  Laterality: N/A; 07/05/2023: CYSTOSCOPY WITH STENT PLACEMENT; Left     Comment:  Procedure: CYSTOSCOPY, WITH STENT INSERTION;  Surgeon:               Twylla Glendia BROCKS, MD;  Location: [facility] OR[facility];  Service:               Urology;  Laterality: Left; 11/11/2015: ELECTROPHYSIOLOGIC STUDY; N/A     Comment:  Procedure: CARDIOVERSION;  Surgeon: Evalene JINNY Lunger,               MD;  Location: [facility] OR[facility];  Service: Cardiovascular;                Laterality: N/A; 01/16/2016: ELECTROPHYSIOLOGIC STUDY; N/A     Comment:  Procedure: A-Flutter Ablation;  Surgeon: Elspeth BROCKS Sage,              MD;  Location: [facility];  Service:               Cardiovascular;  Laterality: N/A; 06/20/2023: LOWER EXTREMITY ANGIOGRAPHY; Right     Comment:  Procedure: Lower Extremity Angiography;  Surgeon: Marea Selinda RAMAN, MD;  Location: [facility];  Service:               Cardiovascular;  Laterality: Right; 12/10/2021: TOTAL HIP ARTHROPLASTY; Right     Comment:  Procedure: TOTAL HIP ARTHROPLASTY ANTERIOR APPROACH;                Surgeon: Lorelle Hussar, MD;  Location: [facility] OR[facility];                Service: Orthopedics;  Laterality: Right;  BMI    Body Mass Index: 28.09 kg/m      Reproductive/Obstetrics negative OB ROS                              Anesthesia Physical Anesthesia Plan  ASA: 3  Anesthesia Plan: General   Post-op Pain Management:    Induction: Intravenous  PONV Risk Score and Plan: 3 and Propofol  infusion and TIVA  Airway Management Planned: Natural Airway and Nasal Cannula  Additional Equipment:   Intra-op Plan:   Post-operative Plan:   Informed Consent: I have  reviewed the patients History and Physical, chart, labs and discussed the procedure including the risks, benefits and alternatives for the proposed anesthesia with the patient or authorized representative who has indicated his/her understanding and acceptance.     Dental Advisory Given  Plan Discussed with: Anesthesiologist, CRNA and Surgeon  Anesthesia Plan Comments: (Patient consented for risks of anesthesia including but not limited to:  - adverse reactions to medications - risk of airway placement if required - damage to eyes, teeth, lips or other oral mucosa - nerve damage due to positioning  - sore throat or hoarseness - Damage to heart, brain, nerves, lungs, other parts of body or loss of life  Patient voiced understanding and assent.)        Anesthesia Quick Evaluation

## 2024-01-02 NOTE — Care Management Important Message (Signed)
 Important Message  Patient Details  Name: [name] MRN: [id number] Date of Birth: 05-16-1950   Important Message Given:  Yes - Medicare IM left w/significant other Mr. Linard, she was gone for a procedure.     [name] [name] 01/02/2024, 1:21 PM
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
## 2024-01-02 NOTE — Plan of Care (Signed)

## 2024-01-03 ENCOUNTER — Ambulatory Visit: Payer: Self-pay | Admitting: Physician Assistant

## 2024-01-03 ENCOUNTER — Other Ambulatory Visit (INDEPENDENT_AMBULATORY_CARE_PROVIDER_SITE_OTHER): Payer: Self-pay | Admitting: Vascular Surgery

## 2024-01-03 ENCOUNTER — Encounter: Payer: Self-pay | Admitting: Cardiology

## 2024-01-03 ENCOUNTER — Ambulatory Visit: Admitting: Cardiology

## 2024-01-03 DIAGNOSIS — I739 Peripheral vascular disease, unspecified: Secondary | ICD-10-CM

## 2024-01-04 ENCOUNTER — Ambulatory Visit (INDEPENDENT_AMBULATORY_CARE_PROVIDER_SITE_OTHER)

## 2024-01-04 ENCOUNTER — Ambulatory Visit: Admitting: Physical Therapy

## 2024-01-04 ENCOUNTER — Encounter (INDEPENDENT_AMBULATORY_CARE_PROVIDER_SITE_OTHER): Payer: Self-pay | Admitting: Nurse Practitioner

## 2024-01-04 ENCOUNTER — Ambulatory Visit (INDEPENDENT_AMBULATORY_CARE_PROVIDER_SITE_OTHER): Admitting: Nurse Practitioner

## 2024-01-04 VITALS — BP 155/76 | HR 46 | Ht 65.0 in | Wt 162.2 lb

## 2024-01-04 DIAGNOSIS — I739 Peripheral vascular disease, unspecified: Secondary | ICD-10-CM

## 2024-01-04 DIAGNOSIS — Z9889 Other specified postprocedural states: Secondary | ICD-10-CM

## 2024-01-04 DIAGNOSIS — M25562 Pain in left knee: Secondary | ICD-10-CM | POA: Diagnosis not present

## 2024-01-04 DIAGNOSIS — I10 Essential (primary) hypertension: Secondary | ICD-10-CM

## 2024-01-04 LAB — VAS US ABI WITH/WO TBI
Left ABI: 1.03
Right ABI: 1.02
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
## 2024-01-04 NOTE — Anesthesia Postprocedure Evaluation (Signed)
 Anesthesia Post Note  Patient: [name]  Procedure(s) Performed: CARDIOVERSION  Patient location during evaluation: [facility] Recovery Anesthesia Type: General Level of consciousness: awake and alert Pain management: pain level controlled Vital Signs Assessment: post-procedure vital signs reviewed and stable Respiratory status: spontaneous breathing, nonlabored ventilation, respiratory function stable and patient connected to nasal cannula oxygen Cardiovascular status: blood pressure returned to baseline and stable Postop Assessment: no apparent nausea or vomiting Anesthetic complications: no   No notable events documented.   Last Vitals:  Vitals:   01/02/24 1315 01/02/24 1330  BP: 125/79 134/67  Pulse: (!) 46 (!) 49  Resp: 18 19  Temp:    SpO2: 97% 94%    Last Pain:  Vitals:   01/02/24 1330  TempSrc:   PainSc: 0-No pain                 Debby Mines

## 2024-01-10 ENCOUNTER — Ambulatory Visit: Admitting: Physical Therapy

## 2024-01-10 DIAGNOSIS — M25512 Pain in left shoulder: Secondary | ICD-10-CM

## 2024-01-10 DIAGNOSIS — M542 Cervicalgia: Secondary | ICD-10-CM

## 2024-01-10 DIAGNOSIS — M546 Pain in thoracic spine: Secondary | ICD-10-CM | POA: Diagnosis present
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
## 2024-01-11 NOTE — Progress Notes (Unsigned)
 Electrophysiology Clinic Note    Date:  01/12/2024  Patient ID:  [name], [name] 1951/03/07, MRN [id number] PCP:  [facility]  Cardiologist:  Deatrice Cage, MD  Electrophysiologist:  OLE ONEIDA HOLTS, MD  Electrophysiology APP:  Maddeline Roorda, NP     Discussed the use of AI scribe software for clinical note transcription with the patient, who gave verbal consent to proceed.   Patient Profile    Chief Complaint: AFib follow-up  History of Present Illness: [name] is a 73 y.o. female with PMH notable for typical atrial flutter, persis afib, bradycardia, PAD s/p multiple stenting, HTN, hypothyroid ; seen today for OLE ONEIDA HOLTS, MD for routine electrophysiology followup.   She is s/p Aflutter ablation 2017 by Dr. Fernande. She is s/p AFib ablation w PVI, posterior wall on 05/24/2023 by Dr. Holts.   I saw her for routine 1 month post-AF ablation on 06/2023 where she was maintaining sinus rhythm.   She was hospitalized for L ureteral stone w hydronephrosis with stent placement 06/2023. Re-admitted later that month with intractable L flank pain.   She presented to ER 10/2023 in aFib w RVR up to 122, rates improved with IV dilt, discharged in AFib. She saw NP [place] for ED follow-up where she was bradycardic, toprol  adjusted to PRN during AFib.   She again presented to ER 12/30/2023 with sudden onset palpitations not relieved with PRN toprol . She was started on IV dilt with improvement in rates. She remained in AFib and had DCCV 10/20, discharged on 120mg  dilt daily.  On follow-up today, she has not had any further AF episodes since discharge from hospital and is tolerating diltiazem  without off-target effects. She continues to take eliquis  BID, sets alarm at 8a/8p to remember. No bleeding concerns on eliquis .   She denies chest pain, chest pressure, palpitations, SOB or presyncope.  Partner says she snores at night, denies daytime sleepiness, or  AM headaches.    Arrhythmia/Device History No specialty comments available.    ROS:  Please see the history of present illness. All other systems are reviewed and otherwise negative.    Physical Exam    VS:  BP (!) 140/62 (BP Location: Left Arm, Patient Position: Sitting, Cuff Size: Normal)   Pulse (!) 49   Ht 5' 5 (1.651 m)   Wt 160 lb 6.4 oz (72.8 kg)   SpO2 98%   BMI 26.69 kg/m  BMI: Body mass index is 26.69 kg/m.    STOP-Bang Score:  3         Wt Readings from Last 3 Encounters:  01/12/24 160 lb 6.4 oz (72.8 kg)  01/04/24 162 lb 3.2 oz (73.6 kg)  12/30/23 150 lb (68 kg)     GEN- The patient is well appearing, alert and oriented x 3 today.   Lungs- Clear to ausculation bilaterally, normal work of breathing.  Heart- Regular rate and rhythm, no murmurs, rubs or gallops Extremities- No peripheral edema, warm, dry   Studies Reviewed   Previous EP, cardiology notes.    EKG is ordered. Personal review of EKG from today shows:    EKG Interpretation Date/Time:  Thursday January 12 2024 10:13:19 EDT Ventricular Rate:  49 PR Interval:  186 QRS Duration:  110 QT Interval:  452 QTC Calculation: 408 R Axis:   75  Text Interpretation: Sinus bradycardia Incomplete left bundle branch block Left ventricular hypertrophy with repolarization abnormality ( Sokolow-Lyon ) Confirmed by Bebe Moncure [id number]) on  01/12/2024 1:00:06 PM    TTE, 01/02/2024  1. Left ventricular ejection fraction, by estimation, is 55 to 60%. The left ventricle has normal function. The left ventricle has no regional wall motion abnormalities. Left ventricular diastolic function could not be evaluated.   2. Right ventricular systolic function is normal. The right ventricular size is normal. Tricuspid regurgitation signal is inadequate for assessing PA pressure.   3. Left atrial size was moderately dilated.   4. The mitral valve is normal in structure. No evidence of mitral valve regurgitation. No  evidence of mitral stenosis.   5. The aortic valve is normal in structure. Aortic valve regurgitation is not visualized. Aortic valve sclerosis/calcification is present, without any evidence of aortic stenosis.   TTE, 06/07/2023  1. Left ventricular ejection fraction, by estimation, is 60 to 65%. The left ventricle has normal function. The left ventricle has no regional wall motion abnormalities. There is mild asymmetric left ventricular hypertrophy of the basal-septal segment. Left ventricular diastolic parameters are indeterminate.   2. Right ventricular systolic function is normal. The right ventricular size is normal. Tricuspid regurgitation signal is inadequate for assessing PA pressure.   3. Left atrial size was severely dilated.   4. Right atrial size was mildly dilated.   5. The mitral valve is normal in structure. Trivial mitral valve regurgitation. No evidence of mitral stenosis.   6. The aortic valve is normal in structure. Aortic valve regurgitation is not visualized. Aortic valve sclerosis is present, with no evidence of aortic valve stenosis.   7. The inferior vena cava is normal in size with greater than 50% respiratory variability, suggesting right atrial pressure of 3 mmHg.    Cardiac CT, 05/16/2023 1. There is normal pulmonary vein drainage into the left atrium. (2 on the right and 2 on the left via a common trunk on left) with ostial measurements as above.  2. The left atrial appendage is a Windsock-cactus type with two lobes and ostial size 38 x 27 mm and length 46 mm, Area 74 mm2. There is no thrombus in the left atrial appendage.  3. The esophagus runs in the left atrial midline and is not in the proximity to any of the pulmonary veins.  4. Coronary calcium  score 672. This is 94th percentile for age/gender.  Assessment and Plan     #) h/o aflutter s/p ablation #) persis aFib s/p ablation with recurrence S/p AFib ablation w isolation of pulm veins, posterior wall 05/2023 by  Dr. Cindie Recently hospitalized with Afib w RVR and s/p DCCV 10/20 Long discussion with patient regarding initiating AAD, do not favor redo ablation at this time Specifically, we discussed multaq, tikosyn, and amiodarone.  At this time, patient prefers to continue to monitor given that she feels well on dilt without reoccurrence Continue 120mg  dilt daily   #) Hypercoag d/t persis afib CHA2DS2-VASc Score = at least 4 [CHF History: 0, HTN History: 1, Diabetes History: 0, Stroke History: 0, Vascular Disease History: 1, Age Score: 1, Gender Score: 1].  Therefore, the patient's annual risk of stroke is 4.8 %.    Stroke ppx - 5mg  eliquis  BID, appropriately dosed No bleeding concerns    #) hypokalemia Recently diagnosed during admission for Afib She is taking 20meq potassium supplement every other day Update K today       Current medicines are reviewed at length with the patient today.   The patient does not have concerns regarding her medicines.  The following changes were made today:  none  Labs/ tests ordered today include:  Orders Placed This Encounter  Procedures   Potassium   EKG 12-Lead     Disposition: Follow up with Dr. Cindie or EP APP in 6 weeks , sooner if needed   Signed, Carnisha Feltz, NP  01/12/24  3:42 PM  Electrophysiology [facility]

## 2024-01-12 ENCOUNTER — Telehealth (HOSPITAL_COMMUNITY): Payer: Self-pay

## 2024-01-12 ENCOUNTER — Ambulatory Visit: Attending: Cardiology | Admitting: Cardiology

## 2024-01-12 ENCOUNTER — Other Ambulatory Visit (HOSPITAL_COMMUNITY): Payer: Self-pay

## 2024-01-12 VITALS — BP 140/62 | HR 49 | Ht 65.0 in | Wt 160.4 lb

## 2024-01-12 DIAGNOSIS — I4819 Other persistent atrial fibrillation: Secondary | ICD-10-CM | POA: Diagnosis not present

## 2024-01-12 DIAGNOSIS — I483 Typical atrial flutter: Secondary | ICD-10-CM | POA: Diagnosis not present

## 2024-01-12 DIAGNOSIS — D6869 Other thrombophilia: Secondary | ICD-10-CM | POA: Diagnosis not present

## 2024-01-12 NOTE — Patient Instructions (Addendum)
 Medication Instructions:  Your physician recommends that you continue on your current medications as directed. Please refer to the Current Medication list given to you today.  We discussed the following medication Tikosyn, Multaq, Amiodarone *If you need a refill on your cardiac medications before your next appointment, please call your pharmacy*  Lab Work: Your provider would like for you to have following labs drawn today Potassium.    Testing/Procedures: No test ordered today   Follow-Up: At [facility], you and your health needs are our priority.  As part of our continuing mission to provide you with exceptional heart care, our providers are all part of one team.  This team includes your primary Cardiologist (physician) and Advanced Practice Providers or APPs (Physician Assistants and Nurse Practitioners) who all work together to provide you with the care you need, when you need it.  Your next appointment:   6 week(s)  Provider:   You may see OLE ONEIDA HOLTS, MD or one of the following Advanced Practice Providers on your designated Care Team:   Charlies Arthur, [place] Ozell Jodie Passey, PA-C Suzann Riddle, NP Daphne Barrack, NP Artist Pouch, PA-C

## 2024-01-12 NOTE — Telephone Encounter (Signed)
 Pharmacy Patient Advocate Encounter  Insurance verification completed.    The patient is insured through [facility]. Patient has Medicare and is not eligible for a copay card, but may be able to apply for patient assistance or Medicare RX Payment Plan (Patient Must reach out to their plan, if eligible for payment plan), if available.    Ran test claim for Multaq 400mg  and the current 30 day co-pay is $0.   This test claim was processed through [employer]- copay amounts may vary at other pharmacies due to [employer], or as the patient moves through the different stages of their insurance plan.

## 2024-01-13 ENCOUNTER — Ambulatory Visit: Payer: Self-pay | Admitting: Cardiology

## 2024-01-13 LAB — POTASSIUM: Potassium: 4 mmol/L (ref 3.5–5.2)

## 2024-01-16 ENCOUNTER — Encounter: Payer: Self-pay | Admitting: Physical Therapy

## 2024-01-16 ENCOUNTER — Ambulatory Visit: Attending: Family Medicine

## 2024-01-16 DIAGNOSIS — M542 Cervicalgia: Secondary | ICD-10-CM | POA: Insufficient documentation

## 2024-01-16 DIAGNOSIS — M546 Pain in thoracic spine: Secondary | ICD-10-CM | POA: Diagnosis present

## 2024-01-16 DIAGNOSIS — M25512 Pain in left shoulder: Secondary | ICD-10-CM | POA: Insufficient documentation
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
## 2024-01-19 ENCOUNTER — Ambulatory Visit: Admitting: Physical Therapy

## 2024-01-23 ENCOUNTER — Ambulatory Visit: Admitting: Physical Therapy
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
## 2024-01-25 ENCOUNTER — Other Ambulatory Visit: Payer: Self-pay

## 2024-01-25 ENCOUNTER — Emergency Department

## 2024-01-25 ENCOUNTER — Emergency Department
Admission: EM | Admit: 2024-01-25 | Discharge: 2024-01-25 | Disposition: A | Discharge status: Home / Self Care | Source: Other Acute Inpatient Hospital | Attending: Emergency Medicine | Admitting: Emergency Medicine

## 2024-01-25 ENCOUNTER — Ambulatory Visit

## 2024-01-25 ENCOUNTER — Encounter: Payer: Self-pay | Admitting: Physical Therapy

## 2024-01-25 DIAGNOSIS — M542 Cervicalgia: Secondary | ICD-10-CM

## 2024-01-25 DIAGNOSIS — I2510 Atherosclerotic heart disease of native coronary artery without angina pectoris: Secondary | ICD-10-CM | POA: Insufficient documentation

## 2024-01-25 DIAGNOSIS — M546 Pain in thoracic spine: Secondary | ICD-10-CM

## 2024-01-25 DIAGNOSIS — R002 Palpitations: Secondary | ICD-10-CM | POA: Diagnosis present

## 2024-01-25 DIAGNOSIS — Z7901 Long term (current) use of anticoagulants: Secondary | ICD-10-CM | POA: Insufficient documentation

## 2024-01-25 DIAGNOSIS — E039 Hypothyroidism, unspecified: Secondary | ICD-10-CM | POA: Insufficient documentation

## 2024-01-25 DIAGNOSIS — I10 Essential (primary) hypertension: Secondary | ICD-10-CM | POA: Diagnosis not present

## 2024-01-25 DIAGNOSIS — I4891 Unspecified atrial fibrillation: Secondary | ICD-10-CM | POA: Diagnosis not present

## 2024-01-25 DIAGNOSIS — M25512 Pain in left shoulder: Secondary | ICD-10-CM

## 2024-01-25 LAB — BASIC METABOLIC PANEL WITH GFR
Anion gap: 10 (ref 5–15)
BUN: 10 mg/dL (ref 8–23)
CO2: 26 mmol/L (ref 22–32)
Calcium: 9.3 mg/dL (ref 8.9–10.3)
Chloride: 107 mmol/L (ref 98–111)
Creatinine, Ser: 0.78 mg/dL (ref 0.44–1.00)
GFR, Estimated: >60 mL/min (ref >60)
Glucose, Bld: 96 mg/dL (ref 70–99)
Potassium: 3.5 mmol/L (ref 3.5–5.1)
Sodium: 144 mmol/L (ref 135–145)

## 2024-01-25 LAB — CBC
HCT: 38.6 % (ref 36.0–46.0)
Hemoglobin: 11.8 g/dL — ABNORMAL LOW (ref 12.0–15.0)
MCH: 23 pg — ABNORMAL LOW (ref 26.0–34.0)
MCHC: 30.6 g/dL (ref 30.0–36.0)
MCV: 75.1 fL — ABNORMAL LOW (ref 80.0–100.0)
Platelets: 266 K/uL (ref 150–400)
RBC: 5.14 MIL/uL — ABNORMAL HIGH (ref 3.87–5.11)
RDW: 15.8 % — ABNORMAL HIGH (ref 11.5–15.5)
WBC: 6.2 K/uL (ref 4.0–10.5)
nRBC: 0 % (ref 0.0–0.2)

## 2024-01-25 LAB — TROPONIN T, HIGH SENSITIVITY: Troponin T High Sensitivity: <15 ng/L (ref 0–19)

## 2024-01-25 MED ORDER — DILTIAZEM HCL 25 MG/5ML IV SOLN
5.0000 mg | Freq: Once | INTRAVENOUS | Status: AC
  Administered 2024-01-25: 5 mg via INTRAVENOUS
  Filled 2024-01-25: qty 5

## 2024-01-25 NOTE — ED Notes (Signed)
 Pt given DC instructions. Pt verbalized understanding of follow up care. Pt ambulatory from ED without difficulty.

## 2024-01-25 NOTE — ED Triage Notes (Signed)
 Pt BIB EMS from home with complaints of palpitations that started around 1300 after eating lunch. Pt has hx of a-fib. Compliant with meds. Pt also on blood thinners.

## 2024-01-25 NOTE — Discharge Instructions (Signed)
 Continue taking the oral diltiazem  as prescribed.  Follow-up with your cardiologist.  Return to the ER for new, worsening, or persistent severe palpitations, chest pain, shortness of breath, leg swelling, weakness, or any other new or worsening symptoms that concern you.

## 2024-01-25 NOTE — ED Provider Notes (Signed)
 [facility] Provider Note    Event Date/Time   First MD Initiated Contact with Patient 01/25/24 1541     (approximate)   History   Palpitations   HPI  [name] is a 73 y.o. female with a history of dismal atrial fibrillation/flutter on Eliquis , peripheral artery disease, hypertension, hyperlipidemia, CAD, and hypothyroidism who presents with palpitations, acute onset today around 1 PM, described as a sensation of a vibration in her chest.  She denies any chest pain, difficulty breathing, or lightheadedness.  It feels similar to prior episodes of atrial fibrillation/flutter.  She took her daily Cardizem  today.  I reviewed the past medical records.  The patient was admitted to the hospitalist service last month after also presenting with palpitations.  She initially received a diltiazem  drip which was weaned off.   Physical Exam   Triage Vital Signs: ED Triage Vitals  Encounter Vitals Group     BP --      Girls Systolic BP Percentile --      Girls Diastolic BP Percentile --      Boys Systolic BP Percentile --      Boys Diastolic BP Percentile --      Pulse --      Resp --      Temp --      Temp src --      SpO2 01/25/24 1540 100 %     Weight 01/25/24 1536 161 lb (73 kg)     Height 01/25/24 1536 5' 5 (1.651 m)     Head Circumference --      Peak Flow --      Pain Score 01/25/24 1539 0     Pain Loc --      Pain Education --      Exclude from Growth Chart --     Most recent vital signs: Vitals:   01/25/24 1547 01/25/24 1621  BP: (!) 143/99   Pulse: (!) 113   Resp: 19   Temp: 98.4 F (36.9 C)   SpO2: 99% 99%     General: Awake, no distress.  CV:  Good peripheral perfusion.  Tachycardic, irregular rhythm. Resp:  Normal effort.  Lungs CTAB. Abd:  No distention.  Other:  No peripheral edema.   ED Results / Procedures / Treatments   Labs (all labs ordered are listed, but only abnormal results are displayed) Labs Reviewed   CBC - Abnormal; Notable for the following components:      Result Value   RBC 5.14 (*)    Hemoglobin 11.8 (*)    MCV 75.1 (*)    MCH 23.0 (*)    RDW 15.8 (*)    All other components within normal limits  BASIC METABOLIC PANEL WITH GFR  TROPONIN T, HIGH SENSITIVITY  TROPONIN T, HIGH SENSITIVITY     EKG  ED ECG REPORT I, Waylon Cassis, the attending physician, personally viewed and interpreted this ECG.  Date: 01/25/2024 EKG Time: 1545 Rate: 94 Rhythm: Atrial flutter with 3-1 block QRS Axis: normal Intervals: normal ST/T Wave abnormalities: Nonspecific ST abnormalities Narrative Interpretation: no evidence of acute ischemia    RADIOLOGY  Chest x-ray: I independently viewed and interpreted the images; there is some pulmonary vascular congestion and small effusions  PROCEDURES:  Critical Care performed: No  Procedures   MEDICATIONS ORDERED IN ED: Medications  diltiazem  (CARDIZEM ) injection 5 mg (5 mg Intravenous Given 01/25/24 1610)     IMPRESSION / MDM / ASSESSMENT AND  PLAN / ED COURSE  I reviewed the triage vital signs and the nursing notes.  73 year old female with PMH as noted above presents with palpitations.  She was recently hospitalized for exacerbation of atrial fibrillation.  She is on oral diltiazem  which she took today.  On exam her heart rate is ranging from the 90s to the 130s, but mainly in the 110s to 120s, in atrial flutter.  Differential diagnosis includes, but is not limited to, atrial fibrillation/flutter, other narrow complex tachydysrhythmia.  We will obtain chest x-ray and lab workup to rule out ischemia or CHF.  I have ordered IV diltiazem  for rate control.  Patient's presentation is most consistent with acute presentation with potential threat to life or bodily function.  The patient is on the cardiac monitor to evaluate for evidence of arrhythmia and/or significant heart rate changes.    ----------------------------------------- 5:57 PM on 01/25/2024 -----------------------------------------  The patient had a return to sinus rhythm after the IV diltiazem .  She has remained in sinus rhythm with a rate in the 40s to 50s since that time.  She currently is asymptomatic.  Lab workup is overall unremarkable.  Troponin is negative.  There is no indication for serial troponins.  BMP and CBC show no acute findings.  Chest x-ray does show some pulmonary vascular congestion and small pleural effusions, but no significant evidence of edema.  Patient has no shortness of breath or other symptoms of acute CHF.  At this time, she is stable for discharge.  She feels comfortable going home.  She will continue her outpatient medications and follow-up with cardiology.  I gave strict return precautions, and she expressed understanding.   FINAL CLINICAL IMPRESSION(S) / ED DIAGNOSES   Final diagnoses:  Atrial fib/flutter, transient (HCC)     Rx / DC Orders   ED Discharge Orders     None        Note:  This document was prepared using Dragon voice recognition software and may include unintentional dictation errors.    Jacolyn Pae, MD 01/25/24 1758

## 2024-01-26 ENCOUNTER — Other Ambulatory Visit: Payer: Self-pay

## 2024-01-26 ENCOUNTER — Other Ambulatory Visit (HOSPITAL_COMMUNITY): Payer: Self-pay

## 2024-01-30 ENCOUNTER — Encounter: Payer: Self-pay | Admitting: Physical Therapy

## 2024-01-30 ENCOUNTER — Ambulatory Visit

## 2024-01-30 DIAGNOSIS — M546 Pain in thoracic spine: Secondary | ICD-10-CM

## 2024-01-30 DIAGNOSIS — M542 Cervicalgia: Secondary | ICD-10-CM

## 2024-01-30 DIAGNOSIS — M25512 Pain in left shoulder: Secondary | ICD-10-CM
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
## 2024-02-02 ENCOUNTER — Ambulatory Visit

## 2024-02-02 DIAGNOSIS — M542 Cervicalgia: Secondary | ICD-10-CM | POA: Diagnosis not present

## 2024-02-02 DIAGNOSIS — M546 Pain in thoracic spine: Secondary | ICD-10-CM

## 2024-02-02 DIAGNOSIS — M25512 Pain in left shoulder: Secondary | ICD-10-CM
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
## 2024-02-06 ENCOUNTER — Ambulatory Visit

## 2024-02-06 ENCOUNTER — Encounter: Payer: Self-pay | Admitting: Physical Therapy

## 2024-02-06 DIAGNOSIS — M25512 Pain in left shoulder: Secondary | ICD-10-CM

## 2024-02-06 DIAGNOSIS — M542 Cervicalgia: Secondary | ICD-10-CM

## 2024-02-06 DIAGNOSIS — M546 Pain in thoracic spine: Secondary | ICD-10-CM

## 2024-02-13 ENCOUNTER — Ambulatory Visit: Admitting: Physical Therapy

## 2024-02-14 ENCOUNTER — Other Ambulatory Visit: Payer: Self-pay | Admitting: *Deleted

## 2024-02-14 DIAGNOSIS — N200 Calculus of kidney: Secondary | ICD-10-CM

## 2024-02-15 ENCOUNTER — Ambulatory Visit: Attending: Family Medicine | Admitting: Physical Therapy

## 2024-02-15 DIAGNOSIS — M542 Cervicalgia: Secondary | ICD-10-CM | POA: Insufficient documentation

## 2024-02-15 DIAGNOSIS — M546 Pain in thoracic spine: Secondary | ICD-10-CM | POA: Diagnosis present

## 2024-02-15 DIAGNOSIS — M25512 Pain in left shoulder: Secondary | ICD-10-CM | POA: Insufficient documentation
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
## 2024-02-17 ENCOUNTER — Ambulatory Visit
Admission: RE | Admit: 2024-02-17 | Discharge: 2024-02-17 | Disposition: A | Source: Ambulatory Visit | Attending: Family Medicine | Admitting: Family Medicine

## 2024-02-17 ENCOUNTER — Ambulatory Visit
Admission: RE | Admit: 2024-02-17 | Discharge: 2024-02-17 | Disposition: A | Attending: Family Medicine | Admitting: Family Medicine

## 2024-02-17 ENCOUNTER — Other Ambulatory Visit: Payer: Self-pay | Admitting: Family Medicine

## 2024-02-17 DIAGNOSIS — R52 Pain, unspecified: Secondary | ICD-10-CM

## 2024-02-21 ENCOUNTER — Ambulatory Visit: Admitting: Physical Therapy

## 2024-02-21 DIAGNOSIS — M542 Cervicalgia: Secondary | ICD-10-CM

## 2024-02-21 DIAGNOSIS — M25512 Pain in left shoulder: Secondary | ICD-10-CM

## 2024-02-21 DIAGNOSIS — M546 Pain in thoracic spine: Secondary | ICD-10-CM
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
## 2024-02-22 ENCOUNTER — Ambulatory Visit: Attending: Cardiology | Admitting: Cardiology

## 2024-02-22 ENCOUNTER — Ambulatory Visit
Admission: RE | Admit: 2024-02-22 | Discharge: 2024-02-22 | Disposition: A | Discharge status: Home / Self Care | Source: Ambulatory Visit | Attending: Urology | Admitting: Urology

## 2024-02-22 ENCOUNTER — Ambulatory Visit: Admitting: Urology

## 2024-02-22 ENCOUNTER — Encounter: Payer: Self-pay | Admitting: Urology

## 2024-02-22 VITALS — BP 138/74 | HR 49 | Wt 164.4 lb

## 2024-02-22 VITALS — BP 171/89 | HR 81 | Ht 65.0 in | Wt 164.0 lb

## 2024-02-22 DIAGNOSIS — Z09 Encounter for follow-up examination after completed treatment for conditions other than malignant neoplasm: Secondary | ICD-10-CM

## 2024-02-22 DIAGNOSIS — Z87442 Personal history of urinary calculi: Secondary | ICD-10-CM | POA: Diagnosis not present

## 2024-02-22 DIAGNOSIS — D6869 Other thrombophilia: Secondary | ICD-10-CM | POA: Diagnosis not present

## 2024-02-22 DIAGNOSIS — I483 Typical atrial flutter: Secondary | ICD-10-CM | POA: Diagnosis not present

## 2024-02-22 DIAGNOSIS — I4819 Other persistent atrial fibrillation: Secondary | ICD-10-CM

## 2024-02-22 DIAGNOSIS — N200 Calculus of kidney: Secondary | ICD-10-CM

## 2024-02-22 DIAGNOSIS — E876 Hypokalemia: Secondary | ICD-10-CM

## 2024-02-22 DIAGNOSIS — I10 Essential (primary) hypertension: Secondary | ICD-10-CM

## 2024-02-22 MED ORDER — AMLODIPINE BESYLATE 2.5 MG PO TABS: 2.5000 mg | ORAL_TABLET | Freq: Every day | ORAL | 3 refills | Status: AC

## 2024-02-22 MED ORDER — DRONEDARONE HCL 400 MG PO TABS: 400.0000 mg | ORAL_TABLET | Freq: Two times a day (BID) | ORAL | 3 refills | Status: AC

## 2024-02-22 NOTE — Progress Notes (Signed)
 Electrophysiology Clinic Note    Date:  02/22/2024  Patient ID:  [name], [name] April 16, 1950, MRN [id number] PCP:  [facility]  Cardiologist:  Deatrice Cage, MD  Electrophysiologist:  OLE ONEIDA HOLTS, MD  Electrophysiology APP:  Collie Kittel, NP     Discussed the use of AI scribe software for clinical note transcription with the patient, who gave verbal consent to proceed.   Patient Profile    Chief Complaint: AFib follow-up  History of Present Illness: [name] is a 73 y.o. female with PMH notable for typical atrial flutter, persis afib, bradycardia, PAD s/p multiple stenting, HTN, hypothyroid ; seen today for OLE ONEIDA HOLTS, MD for routine electrophysiology followup.   She is s/p Aflutter ablation 2017 by Dr. Fernande. She is s/p AFib ablation w PVI, posterior wall on 05/24/2023 by Dr. Holts.   I saw her for routine 1 month post-AF ablation on 06/2023 where she was maintaining sinus rhythm.   She was hospitalized for L ureteral stone w hydronephrosis with stent placement 06/2023. Re-admitted later that month with intractable L flank pain.  She presented to ER 10/2023 in AFib w RVR up to 122, rates improved with IV dilt, discharged in AFib. She again presented to ER 12/30/2023 with sudden onset palpitations not relieved with PRN toprol . She was started on IV dilt with improvement in rates. She remained in AFib and had DCCV 10/20, discharged on 120mg  dilt daily. I saw her 10/30 where she was in sinus rhythm and tolerating dilt well. She did not wish to start AAD.  She again presented to ER 11/12 in aflutter w RVR.   On follow-up today, she did not take her PRN metop prior to the  most recent Aflutter episode. She felt her heart  beating very fast and noticed it was vibrating, so proceeded to ER.  She continues to take eliquis  BID, no bleeding concerns.  She checks BP daily, systolics 130-140, never lower.     Arrhythmia/Device History No  specialty comments available.    ROS:  Please see the history of present illness. All other systems are reviewed and otherwise negative.    Physical Exam    VS:  BP 138/74 (BP Location: Left Arm, Patient Position: Sitting, Cuff Size: Normal) Comment: Knee pain  Pulse (!) 49   Wt 164 lb 6.4 oz (74.6 kg)   SpO2 97%   BMI 27.36 kg/m  BMI: Body mass index is 27.36 kg/m.    STOP-Bang Score:  3         Wt Readings from Last 3 Encounters:  02/22/24 164 lb 6.4 oz (74.6 kg)  01/25/24 161 lb (73 kg)  01/12/24 160 lb 6.4 oz (72.8 kg)     GEN- The patient is well appearing, alert and oriented x 3 today. Poor dentition  Lungs- Clear to ausculation bilaterally, normal work of breathing.  Heart- Regular, bradycardic rate and rhythm, no murmurs, rubs or gallops Extremities- No peripheral edema, warm, dry   Studies Reviewed   Previous EP, cardiology notes.    EKG is ordered. Personal review of EKG from today shows:    EKG Interpretation Date/Time:  Wednesday February 22 2024 09:12:24 EST Ventricular Rate:  50 PR Interval:  190 QRS Duration:  102 QT Interval:  434 QTC Calculation: 395 R Axis:   75  Text Interpretation: Sinus bradycardia Confirmed by Draven Natter [phone]) on 02/22/2024 9:15:42 AM    TTE, 01/02/2024  1. Left ventricular ejection fraction,  by estimation, is 55 to 60%. The left ventricle has normal function. The left ventricle has no regional wall motion abnormalities. Left ventricular diastolic function could not be evaluated.   2. Right ventricular systolic function is normal. The right ventricular size is normal. Tricuspid regurgitation signal is inadequate for assessing PA pressure.   3. Left atrial size was moderately dilated.   4. The mitral valve is normal in structure. No evidence of mitral valve regurgitation. No evidence of mitral stenosis.   5. The aortic valve is normal in structure. Aortic valve regurgitation is not visualized. Aortic valve  sclerosis/calcification is present, without any evidence of aortic stenosis.   TTE, 06/07/2023  1. Left ventricular ejection fraction, by estimation, is 60 to 65%. The left ventricle has normal function. The left ventricle has no regional wall motion abnormalities. There is mild asymmetric left ventricular hypertrophy of the basal-septal segment. Left ventricular diastolic parameters are indeterminate.   2. Right ventricular systolic function is normal. The right ventricular size is normal. Tricuspid regurgitation signal is inadequate for assessing PA pressure.   3. Left atrial size was severely dilated.   4. Right atrial size was mildly dilated.   5. The mitral valve is normal in structure. Trivial mitral valve regurgitation. No evidence of mitral stenosis.   6. The aortic valve is normal in structure. Aortic valve regurgitation is not visualized. Aortic valve sclerosis is present, with no evidence of aortic valve stenosis.   7. The inferior vena cava is normal in size with greater than 50% respiratory variability, suggesting right atrial pressure of 3 mmHg.    Cardiac CT, 05/16/2023 1. There is normal pulmonary vein drainage into the left atrium. (2 on the right and 2 on the left via a common trunk on left) with ostial measurements as above.  2. The left atrial appendage is a Windsock-cactus type with two lobes and ostial size 38 x 27 mm and length 46 mm, Area 74 mm2. There is no thrombus in the left atrial appendage.  3. The esophagus runs in the left atrial midline and is not in the proximity to any of the pulmonary veins.  4. Coronary calcium  score 672. This is 94th percentile for age/gender.  Assessment and Plan     #) h/o aflutter s/p ablation #) persis aFib s/p ablation with recurrence S/p AFib ablation w isolation of pulm veins, posterior wall 05/2023 by Dr. Cindie Recently hospitalized with Afib w RVR and s/p DCCV 10/20 with recurrence of aflutter We again discussed AAD options,  patient is agreeable to initiating at this time.  She does not prefer tikosyn d/t hospital admission.  Will start 400mg  multaq BID Continue 120mg  dilt daily Continue 12.5mg  metop PRN  Encouraged her to notify office if she has Afib and only proceed to ER for chest pain, chest pressure, presyncope or syncope Continue klor con every other day  #) Hypercoag d/t persis afib CHA2DS2-VASc Score = at least 4 [CHF History: 0, HTN History: 1, Diabetes History: 0, Stroke History: 0, Vascular Disease History: 1, Age Score: 1, Gender Score: 1].  Therefore, the patient's annual risk of stroke is 4.8 %.    Stroke ppx - 5mg  eliquis  BID, appropriately dosed No bleeding concerns  #) HTN Elevated in office today and by home readings She was previously on amlodipine , this was stopped after 12/2023 hospitalization Start 2.5mg  amlodipine  Continue 100mg  losartan , dilt as above Continue to check BP daily, notify office if readings remain elevated or if develops LH, dizziness  Current medicines are reviewed at length with the patient today.   The patient does not have concerns regarding her medicines.  The following changes were made today:  none  Labs/ tests ordered today include:  Orders Placed This Encounter  Procedures   EKG 12-Lead     Disposition: Follow up with EP APP in 2 months , sooner if needed   Signed, Chantal Needle, NP  02/22/24  9:36 AM  Electrophysiology [facility]

## 2024-02-22 NOTE — Progress Notes (Signed)
 02/22/2024 10:52 AM   [name] [birth year] [id number]  Referring provider: Inc, [facility] [address] [place],  [place] [address]  Chief Complaint  Patient presents with   Nephrolithiasis   Urologic history:   1.  Nephrolithiasis Stent placement 06/16/2023 for a 9 mm left proximal ureteral calculus with a possible infection Definitive stone treatment 07/19/2023 with ureteroscopy/stone removal Nonobstructing right renal calculi on CT Stone analysis 100% calcium  oxalate monohydrate   HPI: [name] is a 73 y.o. female who presents for a 47-month follow-up  No problems since last visit Denies flank, abdominal or pelvic pain No gross hematuria Think she may have passed a few tiny stones several weeks ago.  Was not having symptoms   PMH: Past Medical History:  Diagnosis Date   Anemia    Arthritis    Atrial flutter (HCC) 09/2015   a.) CHADS2VASc 2 (sex, HTN); b.) s/p DCCV 11/11/2015 (150 J); c.) s/p RFCA 01/17/2016; d.) rate/rhythm maintained without pharmacological intervention; no chronic anticoagulation.   Coronary artery calcification seen on CT scan    Diastolic dysfunction 08/23/2019   a.)  TTE 08/23/2019: EF 55-60%, severe LAE, mild-mod MR, PASP 46.2, G2DD   Dyspnea    Essential hypertension    GERD (gastroesophageal reflux disease)    Hepatic steatosis    History of kidney stones    Incomplete left bundle branch block (LBBB)    Paroxysmal A-fib (HCC)    Pneumonia    as a child   Secondary hypercoagulable state    Subclinical hypothyroidism    Tobacco abuse    Typical atrial flutter (HCC)     Surgical History: Past Surgical History:  Procedure Laterality Date   ABDOMINAL HYSTERECTOMY     ABLATION OF DYSRHYTHMIC FOCUS  01/16/2016   ATRIAL FIBRILLATION ABLATION N/A 05/24/2023   Procedure: ATRIAL FIBRILLATION ABLATION;  Surgeon: Cindie Ole DASEN, MD;  Location: [facility];  Service: Cardiovascular;  Laterality: N/A;    CARDIOVERSION N/A 01/02/2024   Procedure: CARDIOVERSION;  Surgeon: Darliss Rogue, MD;  Location: [facility];  Service: Cardiovascular;  Laterality: N/A;   CYSTOSCOPY WITH STENT PLACEMENT Left 07/05/2023   Procedure: CYSTOSCOPY, WITH STENT INSERTION;  Surgeon: Twylla Glendia BROCKS, MD;  Location: [facility];  Service: Urology;  Laterality: Left;   CYSTOSCOPY/URETEROSCOPY/HOLMIUM LASER/STENT PLACEMENT Left 07/19/2023   Procedure: CYSTOSCOPY/URETEROSCOPY/HOLMIUM LASER/STENT PLACEMENT;  Surgeon: Twylla Glendia BROCKS, MD;  Location: [facility];  Service: Urology;  Laterality: Left;  Exchange   ELECTROPHYSIOLOGIC STUDY N/A 11/11/2015   Procedure: CARDIOVERSION;  Surgeon: Evalene JINNY Lunger, MD;  Location: [facility];  Service: Cardiovascular;  Laterality: N/A;   ELECTROPHYSIOLOGIC STUDY N/A 01/16/2016   Procedure: A-Flutter Ablation;  Surgeon: Elspeth BROCKS Sage, MD;  Location: [facility];  Service: Cardiovascular;  Laterality: N/A;   LOWER EXTREMITY ANGIOGRAPHY Right 06/20/2023   Procedure: Lower Extremity Angiography;  Surgeon: Marea Selinda GORMAN, MD;  Location: [facility];  Service: Cardiovascular;  Laterality: Right;   LOWER EXTREMITY ANGIOGRAPHY Left 12/01/2023   Procedure: Lower Extremity Angiography;  Surgeon: Marea Selinda GORMAN, MD;  Location: [facility];  Service: Cardiovascular;  Laterality: Left;   TOTAL HIP ARTHROPLASTY Right 12/10/2021   Procedure: TOTAL HIP ARTHROPLASTY ANTERIOR APPROACH;  Surgeon: Lorelle Hussar, MD;  Location: [facility];  Service: Orthopedics;  Laterality: Right;    Home Medications:  Allergies as of 02/22/2024       Reactions   Lisinopril  Swelling   Swelling of lips and  face.    Tizanidine  Other (See Comments)   bradycardia   Amoxicillin  Itching, Rash   Latex Itching, Rash        Medication List        Accurate as of February 22, 2024 10:52 AM. If you have any questions, ask your nurse or doctor.          STOP taking these medications    CeleBREX  100 MG  capsule Generic drug: celecoxib  Stopped by: Suzann Riddle       TAKE these medications    acetaminophen  650 MG CR tablet Commonly known as: TYLENOL  Take 650-1,300 mg by mouth every 8 (eight) hours as needed for pain.   albuterol  108 (90 Base) MCG/ACT inhaler Commonly known as: VENTOLIN  HFA Inhale 1-2 puffs into the lungs every 4 (four) hours as needed for wheezing or shortness of breath. Inhale 2-4 puffs by mouth every 4 hours as needed for wheezing, cough, and/or shortness of breath   amLODipine  2.5 MG tablet Commonly known as: NORVASC  Take 1 tablet (2.5 mg total) by mouth daily. Started by: Suzann Riddle   ascorbic acid 500 MG tablet Commonly known as: VITAMIN C Take 500 mg by mouth daily.   aspirin  EC 81 MG tablet Take 1 tablet (81 mg total) by mouth daily. Swallow whole.   atorvastatin  10 MG tablet Commonly known as: Lipitor Take 1 tablet (10 mg total) by mouth daily.   Breo Ellipta  100-25 MCG/ACT Aepb Generic drug: fluticasone  furoate-vilanterol Inhale 1 puff into the lungs daily.   dicyclomine  10 MG capsule Commonly known as: BENTYL  Take 1 capsule (10 mg total) by mouth 3 (three) times daily as needed.   diltiazem  120 MG 24 hr capsule Commonly known as: CARDIZEM  CD Take 1 capsule (120 mg total) by mouth daily.   dronedarone 400 MG tablet Commonly known as: MULTAQ Take 1 tablet (400 mg total) by mouth 2 (two) times daily with a meal. Started by: Suzann Riddle   Eliquis  5 MG Tabs tablet Generic drug: apixaban  TAKE 1 TABLET BY MOUTH TWICE A DAY   levothyroxine  100 MCG tablet Commonly known as: SYNTHROID  Take 100 mcg by mouth daily before breakfast.   Lidocaine  (Anorectal) 5 % Crea as needed (leg pain).   lidocaine  5 % ointment Commonly known as: XYLOCAINE  Apply 1 Application topically as needed.   losartan  100 MG tablet Commonly known as: COZAAR  Take 1 tablet (100 mg total) by mouth daily.   metoprolol  succinate 25 MG 24 hr tablet Commonly  known as: Toprol  XL Take 0.5 tablets (12.5 mg total) by mouth as needed (As needed for palpitations).   oxyCODONE  5 MG immediate release tablet Commonly known as: Oxy IR/ROXICODONE  Take 5-10 mg by mouth every 4 (four) hours as needed.   pantoprazole  40 MG tablet Commonly known as: Protonix  Take 1 tablet (40 mg total) by mouth daily.   potassium chloride  SA 20 MEQ tablet Commonly known as: KLOR-CON  M Take 2 tablets (40 mEq total) by mouth daily for 7 days.   Vitamin D  (Ergocalciferol ) 1.25 MG (50000 UNIT) Caps capsule Commonly known as: DRISDOL  Take 1 capsule (50,000 Units total) by mouth every 7 (seven) days.        Allergies:  Allergies  Allergen Reactions   Lisinopril  Swelling    Swelling of lips and face.    Tizanidine  Other (See Comments)    bradycardia   Amoxicillin  Itching and Rash   Latex Itching and Rash    Family History: Family History  Problem Relation  Age of Onset   Hypertension Mother    Diabetes Mother    Diabetes Father    Diabetes Brother    Hypertension Brother    Hypertension Brother    Heart attack Brother    Diabetes Brother    Breast cancer Neg Hx     Social History:  reports that she quit smoking about 8 years ago. Her smoking use included cigarettes. She has never used smokeless tobacco. She reports current alcohol use. She reports that she does not use drugs.   Physical Exam: BP (!) 171/89   Pulse 81   Ht 5' 5 (1.651 m)   Wt 164 lb (74.4 kg)   BMI 27.29 kg/m   Constitutional:  Alert, No acute distress. HEENT: [place] AT Respiratory: Normal respiratory effort, no increased work of breathing. Psychiatric: Normal mood and affect.   Pertinent Imaging: KUB performed prior to today's visit was personally reviewed and interpreted.  Moderate mount of stool and bowel gas obscuring the renal outlines noted.  No definite calcifications suspicious for urinary tract stones are seen  Assessment & Plan:    1.   Nephrolithiasis Asymptomatic Negative KUB today Follow-up 1 year with RUS prior Instructed call earlier for recurrent flank/abdominal pain   Glendia JAYSON Barba, MD  [facility] [address], [address] [place], [place] [address] [phone]

## 2024-02-22 NOTE — Patient Instructions (Signed)
 Your physician recommends the following medication changes.   START TAKING: Amlodipine  2.5 mg once a day Multaq 400 mg twice a day   *If you need a refill on your cardiac medications before your next appointment, please call your pharmacy*  Lab Work: No labs ordered today    Testing/Procedures: No test ordered today   Follow-Up:  Please keep a home blood pressure log. Call our office if you have afib. [phone].   At [facility], you and your health needs are our priority.  As part of our continuing mission to provide you with exceptional heart care, our providers are all part of one team.  This team includes your primary Cardiologist (physician) and Advanced Practice Providers or APPs (Physician Assistants and Nurse Practitioners) who all work together to provide you with the care you need, when you need it.  Your next appointment:   2 month(s)  Provider:   Suzann Riddle, NP

## 2024-02-23 ENCOUNTER — Ambulatory Visit: Admitting: Physical Therapy

## 2024-02-23 ENCOUNTER — Ambulatory Visit: Admitting: Cardiology

## 2024-02-23 DIAGNOSIS — M25512 Pain in left shoulder: Secondary | ICD-10-CM

## 2024-02-23 DIAGNOSIS — M542 Cervicalgia: Secondary | ICD-10-CM | POA: Diagnosis not present

## 2024-02-23 DIAGNOSIS — M546 Pain in thoracic spine: Secondary | ICD-10-CM
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
## 2024-02-27 ENCOUNTER — Ambulatory Visit: Admitting: Physical Therapy

## 2024-02-28 ENCOUNTER — Ambulatory Visit: Admitting: Physical Therapy

## 2024-02-29 ENCOUNTER — Ambulatory Visit: Admitting: Physical Therapy

## 2024-03-05 ENCOUNTER — Ambulatory Visit: Admitting: Physical Therapy

## 2024-03-05 DIAGNOSIS — M542 Cervicalgia: Secondary | ICD-10-CM

## 2024-03-05 DIAGNOSIS — M546 Pain in thoracic spine: Secondary | ICD-10-CM

## 2024-03-05 DIAGNOSIS — M25512 Pain in left shoulder: Secondary | ICD-10-CM
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
## 2024-03-12 ENCOUNTER — Ambulatory Visit: Admitting: Physical Therapy

## 2024-03-19 ENCOUNTER — Ambulatory Visit: Attending: Family Medicine

## 2024-03-19 DIAGNOSIS — M546 Pain in thoracic spine: Secondary | ICD-10-CM | POA: Diagnosis present

## 2024-03-19 DIAGNOSIS — M25512 Pain in left shoulder: Secondary | ICD-10-CM | POA: Insufficient documentation

## 2024-03-19 DIAGNOSIS — M542 Cervicalgia: Secondary | ICD-10-CM | POA: Insufficient documentation
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
## 2024-03-21 ENCOUNTER — Ambulatory Visit

## 2024-03-25 ENCOUNTER — Other Ambulatory Visit: Payer: Self-pay

## 2024-03-25 ENCOUNTER — Emergency Department
Admission: EM | Admit: 2024-03-25 | Discharge: 2024-03-25 | Disposition: A | Discharge status: Home / Self Care | Attending: Emergency Medicine | Admitting: Emergency Medicine

## 2024-03-25 ENCOUNTER — Emergency Department

## 2024-03-25 ENCOUNTER — Encounter: Payer: Self-pay | Admitting: Emergency Medicine

## 2024-03-25 DIAGNOSIS — E039 Hypothyroidism, unspecified: Secondary | ICD-10-CM | POA: Insufficient documentation

## 2024-03-25 DIAGNOSIS — I4892 Unspecified atrial flutter: Secondary | ICD-10-CM | POA: Diagnosis not present

## 2024-03-25 DIAGNOSIS — J810 Acute pulmonary edema: Secondary | ICD-10-CM | POA: Diagnosis not present

## 2024-03-25 DIAGNOSIS — R0602 Shortness of breath: Secondary | ICD-10-CM | POA: Diagnosis present

## 2024-03-25 DIAGNOSIS — R002 Palpitations: Secondary | ICD-10-CM

## 2024-03-25 DIAGNOSIS — E875 Hyperkalemia: Secondary | ICD-10-CM | POA: Diagnosis not present

## 2024-03-25 DIAGNOSIS — M542 Cervicalgia: Secondary | ICD-10-CM | POA: Diagnosis not present

## 2024-03-25 DIAGNOSIS — Z7901 Long term (current) use of anticoagulants: Secondary | ICD-10-CM | POA: Diagnosis not present

## 2024-03-25 LAB — TROPONIN T, HIGH SENSITIVITY
Troponin T High Sensitivity: <15 ng/L (ref 0–19)
Troponin T High Sensitivity: 18 ng/L (ref 0–19)

## 2024-03-25 LAB — TSH: TSH: 5.36 u[IU]/mL — ABNORMAL HIGH (ref 0.350–4.500)

## 2024-03-25 LAB — CBC
HCT: 39.9 % (ref 36.0–46.0)
Hemoglobin: 12.2 g/dL (ref 12.0–15.0)
MCH: 22.8 pg — ABNORMAL LOW (ref 26.0–34.0)
MCHC: 30.6 g/dL (ref 30.0–36.0)
MCV: 74.4 fL — ABNORMAL LOW (ref 80.0–100.0)
Platelets: 267 K/uL (ref 150–400)
RBC: 5.36 MIL/uL — ABNORMAL HIGH (ref 3.87–5.11)
RDW: 16.3 % — ABNORMAL HIGH (ref 11.5–15.5)
WBC: 8.4 K/uL (ref 4.0–10.5)
nRBC: 0 % (ref 0.0–0.2)

## 2024-03-25 LAB — BASIC METABOLIC PANEL WITH GFR
Anion gap: 8 (ref 5–15)
BUN: 10 mg/dL (ref 8–23)
CO2: 25 mmol/L (ref 22–32)
Calcium: 9.4 mg/dL (ref 8.9–10.3)
Chloride: 108 mmol/L (ref 98–111)
Creatinine, Ser: 0.79 mg/dL (ref 0.44–1.00)
GFR, Estimated: >60 mL/min
Glucose, Bld: 109 mg/dL — ABNORMAL HIGH (ref 70–99)
Potassium: 5.4 mmol/L — ABNORMAL HIGH (ref 3.5–5.1)
Sodium: 141 mmol/L (ref 135–145)

## 2024-03-25 LAB — T4, FREE: Free T4: 0.93 ng/dL (ref 0.80–2.00)

## 2024-03-25 LAB — PRO BRAIN NATRIURETIC PEPTIDE: Pro Brain Natriuretic Peptide: 326 pg/mL — ABNORMAL HIGH

## 2024-03-25 LAB — MAGNESIUM: Magnesium: 2.1 mg/dL (ref 1.7–2.4)

## 2024-03-25 MED ORDER — SODIUM ZIRCONIUM CYCLOSILICATE 10 G PO PACK
10.0000 g | PACK | Freq: Once | ORAL | Status: AC
  Administered 2024-03-25: 10 g via ORAL
  Filled 2024-03-25: qty 1

## 2024-03-25 MED ORDER — FUROSEMIDE 20 MG PO TABS: 20.0000 mg | ORAL_TABLET | Freq: Every day | ORAL | 11 refills | Status: AC

## 2024-03-25 NOTE — ED Triage Notes (Signed)
 Pt in with palpitations and HTN - states she was lying down tonight and felt her heart beating fast. Pt then took her pulse and it was 120, took bp and it was high. Pt mentions she had dental work done on Tuesday, is currently on Cephalexin  after dental visit. Hs of Afib, took a prn Metoprolol  around 0145. Reports feeling of heartburn

## 2024-03-25 NOTE — ED Provider Notes (Signed)
 "  [facility] Provider Note    Event Date/Time   First MD Initiated Contact with Patient 03/25/24 [phone]     (approximate)   History   Palpitations and Hypertension   HPI  [name] is a 74 y.o. female   Past medical history of a flutter on Eliquis  metoprolol , diastolic dysfunction, hypothyroid, here with palpitations.  Happened while sleeping tonight.  Feels like her heart was fluttering and racing.  Said that she was tachycardic to the 120s.  Denies any chest pain but has had some mild shortness of breath especially when laying flat.  Had a discussion with her doctor some months ago regarding starting water  pills but never did.  Mild edematous legs.  Fully compliant with all medications including her Eliquis .  Independent Historian contributed to assessment above: Husband corroborates information past medical history as above      Physical Exam   Triage Vital Signs: ED Triage Vitals  Encounter Vitals Group     BP 03/25/24 0310 (!) 166/74     Girls Systolic BP Percentile --      Girls Diastolic BP Percentile --      Boys Systolic BP Percentile --      Boys Diastolic BP Percentile --      Pulse Rate 03/25/24 0310 (!) 56     Resp 03/25/24 0310 18     Temp 03/25/24 0310 98.1 F (36.7 C)     Temp Source 03/25/24 0310 Oral     SpO2 03/25/24 0310 98 %     Weight 03/25/24 0327 164 lb 0.4 oz (74.4 kg)     Height --      Head Circumference --      Peak Flow --      Pain Score 03/25/24 0327 0     Pain Loc --      Pain Education --      Exclude from Growth Chart --     Most recent vital signs: Vitals:   03/25/24 0615 03/25/24 0630  BP:    Pulse: 82 86  Resp:    Temp:    SpO2: 96% 95%    General: Awake, no distress.  CV:  Good peripheral perfusion. Resp:  Normal effort.  Abd:  No distention.  Other:  Trace edema in the bilateral ankles.  Mild rales at lung bases.  Breathing comfortably on room air, no hypoxemia.  Normal vital  signs.  Soft nontender abdomen.   ED Results / Procedures / Treatments   Labs (all labs ordered are listed, but only abnormal results are displayed) Labs Reviewed  CBC - Abnormal; Notable for the following components:      Result Value   RBC 5.36 (*)    MCV 74.4 (*)    MCH 22.8 (*)    RDW 16.3 (*)    All other components within normal limits  BASIC METABOLIC PANEL WITH GFR - Abnormal; Notable for the following components:   Potassium 5.4 (*)    Glucose, Bld 109 (*)    All other components within normal limits  PRO BRAIN NATRIURETIC PEPTIDE - Abnormal; Notable for the following components:   Pro Brain Natriuretic Peptide 326.0 (*)    All other components within normal limits  TSH - Abnormal; Notable for the following components:   TSH 5.360 (*)    All other components within normal limits  T4, FREE  MAGNESIUM  TROPONIN T, HIGH SENSITIVITY  TROPONIN T, HIGH SENSITIVITY  I ordered and reviewed the above labs they are notable for BNP is 326 thyroid  studies unremarkable and a potassium mildly elevated 5.4.  EKG  ED ECG REPORT I, Ginnie Shams, the attending physician, personally viewed and interpreted this ECG.   Date: 03/25/2024  EKG Time: 0447  Rate: 94  Rhythm: AFlutter  Axis: nl  Intervals:nl  ST&T Change: no stemi    RADIOLOGY I independently reviewed and interpreted chest x-ray and see no obvious focality or pneumothorax I also reviewed radiologist's formal read.   PROCEDURES:  Critical Care performed: No  Procedures   MEDICATIONS ORDERED IN ED: Medications  sodium zirconium cyclosilicate  (LOKELMA ) packet 10 g (10 g Oral Given 03/25/24 0701)    IMPRESSION / MDM / ASSESSMENT AND PLAN / ED COURSE  I reviewed the triage vital signs and the nursing notes.                                Patient's presentation is most consistent with acute presentation with potential threat to life or bodily function.  Differential diagnosis includes, but is not  limited to, metabolic derangements, dysrhythmia, atrial flutter or atrial fibrillation with RVR, dehydration, heart failure exacerbation, ACS   The patient is on the cardiac monitor to evaluate for evidence of arrhythmia and/or significant heart rate changes.  MDM:    Patient with known A-flutter compliant with all of her medications here with palpitations.  EKG shows atrial flutter with rate well-controlled after taking an extra dose of metoprolol  as advised by her cardiologist in the past.  Check electrolytes, thyroid  studies and all appears well aside from mild hyperkalemia without any other renal dysfunction.  There were no EKG findings consistent with hyperkalemic changes.  She had flat troponins and so doubt cardiac ischemia.   Has some edema on her legs, rales on auscultation, orthopnea, chest x-ray shows some pulmonary edema and with a history of diastolic dysfunction I think she is suffering from a mild CHF exacerbation.  She had talked with her doctor in the past and had considered starting a diuretic but had not yet done so.  I will start her on 20 mg daily.  I gave her a dose of Lokelma  to help lower that very mildly elevated potassium.  I considered hospitalization for admission or observation however given no respiratory distress or hypoxemia related to her CHF exacerbation I think this can be managed as outpatient.  I have made a CHF clinic referral.  She had very mild hyperkalemia that I have addressed with a dose of Lokelma  and the starting of Lasix  and no concomitant renal dysfunction evident today and no EKG changes so I think this can be managed as outpatient as well.  Strict return precautions were given.        FINAL CLINICAL IMPRESSION(S) / ED DIAGNOSES   Final diagnoses:  Acute pulmonary edema (HCC)  Palpitations  Hyperkalemia     Rx / DC Orders   ED Discharge Orders          Ordered    AMB referral to CHF clinic        03/25/24 0554    furosemide  (LASIX ) 20  MG tablet  Daily        03/25/24 0554             Note:  This document was prepared using Dragon voice recognition software and may include unintentional dictation errors.    Shams,  Ginnie, MD 03/25/24 9284  "

## 2024-03-25 NOTE — Discharge Instructions (Addendum)
 Fortunately your heart rhythms are normal and shows atrial flutter as we have known you have had for many years.  Your potassium level was ever so slightly high, you got a dose of medication to help lower this level in the emergency department.   You had a little extra fluid in the lungs when may make you short of breath. I started you on a water  pill that will make you pee more and help with the fluid and the potassium levels too. I sent a referral to heart failure clinic who will call you to schedule a follow-up appointment as the fluid in your lungs may be related to heart conditions.  You do not have a heart attack tonight.  Thank you for choosing us  for your health care today!  Please see your primary doctor this week for a follow up appointment.   If you have any new, worsening, or unexpected symptoms call your doctor right away or come back to the emergency department for reevaluation.  It was my pleasure to care for you today.   Ginnie EDISON Cyrena, MD

## 2024-03-26 ENCOUNTER — Ambulatory Visit

## 2024-04-02 ENCOUNTER — Other Ambulatory Visit (INDEPENDENT_AMBULATORY_CARE_PROVIDER_SITE_OTHER): Payer: Self-pay | Admitting: Nurse Practitioner

## 2024-04-02 DIAGNOSIS — I739 Peripheral vascular disease, unspecified: Secondary | ICD-10-CM

## 2024-04-05 ENCOUNTER — Encounter (INDEPENDENT_AMBULATORY_CARE_PROVIDER_SITE_OTHER)

## 2024-04-05 ENCOUNTER — Ambulatory Visit (INDEPENDENT_AMBULATORY_CARE_PROVIDER_SITE_OTHER): Admitting: Nurse Practitioner

## 2024-04-09 NOTE — Progress Notes (Unsigned)
 "  Cardiology Clinic Note   Date: 04/09/2024 ID: [name], [name] May 24, 1950, MRN [id number]  [facility] Providers Cardiologist:  Deatrice Cage, MD Electrophysiologist:  OLE ONEIDA HOLTS, MD (Inactive)  Electrophysiology APP:  Riddle, Suzann, NP { Click to update primary MD,subspecialty MD or APP then REFRESH:1}    Chief Complaint   [name] is a 74 y.o. female who presents to the clinic today for ***  Patient Profile   [name] is followed by *** for the history outlined below.      Past medical history significant for: CAD. Cardiac CTA 05/16/2023 (part of A-fib ablation workup): Coronary calcium  score 672 (94th percentile). PAD. Lower extremity angiography 06/20/2023: PTA with drug-coated angioplasty balloon to right proximal SFA.  Mechanical thrombectomy right popliteal artery.  PTA with angioplasty balloon right popliteal artery.  Stent placement right peroneal artery.  Stent placement right tibioperoneal trunk.  Stent placement right popliteal artery.  Stent placement proximal right SFA. Lower extremity angiography 12/01/2023: PTA left peroneal artery and tibioperoneal trunk.  Atherectomy left SFA and popliteal artery.  Angioplasty left SFA and popliteal artery.  Stent placement left SFA. ABI 01/04/2024: Resting ABI within normal range bilaterally.  Abnormal TBI bilaterally. Chronic HFpEF Echo 01/02/2024: EF 55 to 60%.  No RWMA.  Indeterminate diastolic parameters.  Normal RV size/function.  Moderate LAE.  Aortic valve sclerosis/calcification without stenosis. PAF/a-flutter. Onset July 2017. DCCV 11/11/2015. A-flutter ablation 01/16/2016. A-fib ablation 05/24/2023. DCCV 01/02/2024. Hypertension. Hyperlipidemia. Lipid panel 06/07/2023: LDL 53, HDL 35, TG 131, total 114. Former tobacco abuse. Ureteral calculus. S/p ureteral stent placement 07/06/2023.  In summary, patient has a history of Afib/flutter found during hospital admission July 2017.  She was  rate controlled and started on Eliquis .  She underwent Lexiscan  which showed no evidence of ischemia.  Echo demonstrated EF 50 to 55%.  She underwent cardioversion in August 2017.  She underwent a flutter ablation in November 2017.  Patient continued to have sinus bradycardia post ablation.  Patient was evaluated by Dr. Holts January 2025.  He reported 2024 she ate something that did not interact with her developed an irregular heartbeat.  Upon EMS arrival she was found to be in A-fib with RVR with rates in the 120s.  By the time.  She has a ambulance she was back in sinus rhythm.  She had not had another episode since.  Following her visit she was admitted and the ED twice for A-fib with RVR.  She underwent A-fib ablation on 05/24/2023. Upon follow up in April 2025 she was maintaining sinus rhythm.  Upon follow-up with general cardiology in May she continued to maintain sinus rhythm.  She denied any episodes of A-fib since her ablation.  Patient presented to the ED on 10/26/2023 for chest pain and DOE.  She was found to be in A-fib with RVR 122 bpm.  She was treated with IV diltiazem  with improvement in heart rate.  Initial labs: WBC 8.6, hemoglobin 12.7, hematocrit 41.2, sodium 142, potassium 3.7, creatinine 0.65, BUN 12, AST 25, ALT 11, BNP 485.5.  Troponin negative x 1.  She was discharged in rate controlled A-fib to follow-up with cardiology.  She was started on low-dose Toprol .  Upon office follow-up she reported heart rate dipping into the low 40s since starting on daily Toprol .  Heart rate at the time of her visit was 43 bpm.  She was instructed to stop daily Toprol  and take it as needed for elevated heart rate.  Patient  underwent hospital admission on 12/30/2023 for palpitations with heart rate as high as 185 bpm.  She was found to be in a flutter.  She received diltiazem  10 mg by EMS with some improvement in heart rate.  She was started on IV diltiazem .  She underwent DCCV to restore normal sinus  rhythm.  She was discharged on diltiazem  120 mg daily.  Patient was seen by Suzann Riddle, NP on 01/12/2024 for hospital follow-up.  She was maintaining sinus rhythm.  No medication changes were made.  ED evaluation 01/25/2024 for a flutter with heart rate 110-120s.  She received IV diltiazem  for rate control.  Heart rate improved and she was discharged home.  Upon EP follow-up in December 2025 she reported not taking as needed metoprolol  prior to presenting to the ED the month prior.  She was started on Multaq .  She was encouraged to contact the office with episodes of A-fib and only proceed to the ED for chest pain, presyncope or syncope.  Patient underwent ED evaluation 03/25/2024 for palpitations and elevated heart rate.SABRA  She was found to be in a flutter with heart rate 94 bpm.  She reported mild shortness of breath and orthopnea.  She was hyperkalemic at the time of her visit and received a dose of Lokelma .  She was started on Lasix  20 mg daily and discharged home.     History of Present Illness    Today, patient ***  PAF/a-flutter/bradycardia Onset July 2017.  S/p DCCV August 2017, a-flutter ablation November 2017, A-fib ablation March 2025, DCCV October 2025.  Hospital admission with a-flutter requiring DCCV October 2025.  2 additional ED visits November 2025 and January 2026 for palpitations and elevated heart rate.  She was mildly volume overloaded at January ED visit.  Denies spontaneous bleeding concerns.  Patient*** - Continue Eliquis , diltiazem , Multaq .  Chronic HFpEF Echo October 2025 showed EF 55 to 60%, normal RV size/function, moderate LAE, aortic valve sclerosis/calcification without stenosis.  Patient*** Euvolemic and well compensated on exam. - Continue Lasix , losartan .   Hypertension BP today***. No report of headaches or dizziness.  - Continue amlodipine , losartan , diltiazem .   Hyperlipidemia LDL 53 in March 2025, at goal. - Continue atorvastatin . - Lipid panel***    PAD S/p multiple stent placement in February 2025, PTA and stent placement left lower extremity September 2025.  ABIs October 2025 within normal limits.  Patient*** - Continue atorvastatin  and aspirin . - Continue to follow with vascular surgery.  ROS: All other systems reviewed and are otherwise negative except as noted in History of Present Illness.  EKGs/Labs Reviewed        12/02/2023: ALT 9; AST 16 03/25/2024: BUN 10; Creatinine, Ser 0.79; Potassium 5.4; Sodium 141   03/25/2024: Hemoglobin 12.2; WBC 8.4   03/25/2024: TSH 5.360   10/26/2023: B Natriuretic Peptide 485.5 03/25/2024: Pro Brain Natriuretic Peptide 326.0  ***  Risk Assessment/Calculations    {Does this patient have ATRIAL FIBRILLATION?:[phone]} No BP recorded.  {Refresh Note OR Click here to enter BP  :1}***   STOP-Bang Score:  3  { Consider Dx Sleep Disordered Breathing or Sleep Apnea  ICD G47.33          :1}     Physical Exam    VS:  There were no vitals taken for this visit. , BMI There is no height or weight on file to calculate BMI.  GEN: Well nourished, well developed, in no acute distress. Neck: No JVD or carotid bruits. Cardiac: *** RRR. *** No  murmur. No rubs or gallops.   Respiratory:  Respirations regular and unlabored. Clear to auscultation without rales, wheezing or rhonchi. GI: Soft, nontender, nondistended. Extremities: Radials/DP/PT 2+ and equal bilaterally. No clubbing or cyanosis. No edema ***  Skin: Warm and dry, no rash. Neuro: Strength intact.  Assessment & Plan   ***  Disposition: ***     {Are you ordering a CV Procedure (e.g. stress test, cath, DCCV, TEE, etc)?   Press F2        :[id number]}   Signed, Barnie HERO. Tobe Kervin, DNP, NP-C  "

## 2024-04-12 ENCOUNTER — Ambulatory Visit: Admitting: Student

## 2024-04-19 ENCOUNTER — Other Ambulatory Visit (INDEPENDENT_AMBULATORY_CARE_PROVIDER_SITE_OTHER): Payer: Self-pay | Admitting: Vascular Surgery

## 2024-04-19 NOTE — Progress Notes (Unsigned)
 "  Cardiology Clinic Note   Date: 04/19/2024 ID: [name], [name] June 29, 1950, MRN [id number]  [facility] Providers Cardiologist:  Deatrice Cage, MD Electrophysiologist:  OLE ONEIDA HOLTS, MD (Inactive)  Electrophysiology APP:  Riddle, Suzann, NP { Click to update primary MD,subspecialty MD or APP then REFRESH:1}    Chief Complaint   [name] is a 74 y.o. female who presents to the clinic today for ***  Patient Profile   [name] is followed by *** for the history outlined below.      Past medical history significant for: CAD. Cardiac CTA 05/16/2023 (part of A-fib ablation workup): Coronary calcium  score 672 (94th percentile). PAD. Lower extremity angiography 06/20/2023: PTA with drug-coated angioplasty balloon to right proximal SFA.  Mechanical thrombectomy right popliteal artery.  PTA with angioplasty balloon right popliteal artery.  Stent placement right peroneal artery.  Stent placement right tibioperoneal trunk.  Stent placement right popliteal artery.  Stent placement proximal right SFA. Lower extremity angiography 12/01/2023: PTA left peroneal artery and tibioperoneal trunk.  Atherectomy left SFA and popliteal artery.  Angioplasty left SFA and popliteal artery.  Stent placement left SFA. ABI 01/04/2024: Resting ABI within normal range bilaterally.  Abnormal TBI bilaterally. Chronic HFpEF Echo 01/02/2024: EF 55 to 60%.  No RWMA.  Indeterminate diastolic parameters.  Normal RV size/function.  Moderate LAE.  Aortic valve sclerosis/calcification without stenosis. PAF/a-flutter. Onset July 2017. DCCV 11/11/2015. A-flutter ablation 01/16/2016. A-fib ablation 05/24/2023. DCCV 01/02/2024. Hypertension. Hyperlipidemia. Lipid panel 06/07/2023: LDL 53, HDL 35, TG 131, total 114. Former tobacco abuse. Ureteral calculus. S/p ureteral stent placement 07/06/2023.  In summary, patient has a history of Afib/flutter found during hospital admission July 2017.  She was  rate controlled and started on Eliquis .  She underwent Lexiscan  which showed no evidence of ischemia.  Echo demonstrated EF 50 to 55%.  She underwent cardioversion in August 2017.  She underwent a flutter ablation in November 2017.  Patient continued to have sinus bradycardia post ablation.  Patient was evaluated by Dr. Holts January 2025.  He reported 2024 she ate something that did not interact with her developed an irregular heartbeat.  Upon EMS arrival she was found to be in A-fib with RVR with rates in the 120s.  By the time.  She has a ambulance she was back in sinus rhythm.  She had not had another episode since.  Following her visit she was admitted and the ED twice for A-fib with RVR.  She underwent A-fib ablation on 05/24/2023. Upon follow up in April 2025 she was maintaining sinus rhythm.  Upon follow-up with general cardiology in May she continued to maintain sinus rhythm.  She denied any episodes of A-fib since her ablation.  Patient presented to the ED on 10/26/2023 for chest pain and DOE.  She was found to be in A-fib with RVR 122 bpm.  She was treated with IV diltiazem  with improvement in heart rate.  Initial labs: WBC 8.6, hemoglobin 12.7, hematocrit 41.2, sodium 142, potassium 3.7, creatinine 0.65, BUN 12, AST 25, ALT 11, BNP 485.5.  Troponin negative x 1.  She was discharged in rate controlled A-fib to follow-up with cardiology.  She was started on low-dose Toprol .  Upon office follow-up she reported heart rate dipping into the low 40s since starting on daily Toprol .  Heart rate at the time of her visit was 43 bpm.  She was instructed to stop daily Toprol  and take it as needed for elevated heart rate.  Patient  underwent hospital admission on 12/30/2023 for palpitations with heart rate as high as 185 bpm.  She was found to be in a flutter.  She received diltiazem  10 mg by EMS with some improvement in heart rate.  She was started on IV diltiazem .  She underwent DCCV to restore normal sinus  rhythm.  She was discharged on diltiazem  120 mg daily.  Patient was seen by Suzann Riddle, NP on 01/12/2024 for hospital follow-up.  She was maintaining sinus rhythm.  No medication changes were made.  ED evaluation 01/25/2024 for a flutter with heart rate 110-120s.  She received IV diltiazem  for rate control.  Heart rate improved and she was discharged home.  Upon EP follow-up in December 2025 she reported not taking as needed metoprolol  prior to presenting to the ED the month prior.  She was started on Multaq .  She was encouraged to contact the office with episodes of A-fib and only proceed to the ED for chest pain, presyncope or syncope.  Patient underwent ED evaluation 03/25/2024 for palpitations and elevated heart rate.[name]  She was found to be in a flutter with heart rate 94 bpm.  She reported mild shortness of breath and orthopnea.  She was hyperkalemic at the time of her visit and received a dose of Lokelma .  She was started on Lasix  20 mg daily and discharged home.     History of Present Illness    Today, patient ***  PAF/a-flutter/bradycardia Onset July 2017.  S/p DCCV August 2017, a-flutter ablation November 2017, A-fib ablation March 2025, DCCV October 2025.  Hospital admission with a-flutter requiring DCCV October 2025.  2 additional ED visits November 2025 and January 2026 for palpitations and elevated heart rate.  She was mildly volume overloaded at January ED visit.  Denies spontaneous bleeding concerns.  Patient*** - Continue Eliquis , diltiazem , Multaq .  Chronic HFpEF Echo October 2025 showed EF 55 to 60%, normal RV size/function, moderate LAE, aortic valve sclerosis/calcification without stenosis.  Patient*** Euvolemic and well compensated on exam. - Continue Lasix , losartan .   Hypertension BP today***. No report of headaches or dizziness.  - Continue amlodipine , losartan , diltiazem .   Hyperlipidemia LDL 53 in March 2025, at goal. - Continue atorvastatin . - Lipid panel***    PAD S/p multiple stent placement in February 2025, PTA and stent placement left lower extremity September 2025.  ABIs October 2025 within normal limits.  Patient*** - Continue atorvastatin  and aspirin . - Continue to follow with vascular surgery.  ROS: All other systems reviewed and are otherwise negative except as noted in History of Present Illness.  EKGs/Labs Reviewed        12/02/2023: ALT 9; AST 16 03/25/2024: BUN 10; Creatinine, Ser 0.79; Potassium 5.4; Sodium 141   03/25/2024: Hemoglobin 12.2; WBC 8.4   03/25/2024: TSH 5.360   10/26/2023: B Natriuretic Peptide 485.5 03/25/2024: Pro Brain Natriuretic Peptide 326.0  ***  Risk Assessment/Calculations    {Does this patient have ATRIAL FIBRILLATION?:[phone]} No BP recorded.  {Refresh Note OR Click here to enter BP  :1}***   STOP-Bang Score:  3  { Consider Dx Sleep Disordered Breathing or Sleep Apnea  ICD G47.33          :1}     Physical Exam    VS:  There were no vitals taken for this visit. , BMI There is no height or weight on file to calculate BMI.  GEN: Well nourished, well developed, in no acute distress. Neck: No JVD or carotid bruits. Cardiac: *** RRR. *** No  murmur. No rubs or gallops.   Respiratory:  Respirations regular and unlabored. Clear to auscultation without rales, wheezing or rhonchi. GI: Soft, nontender, nondistended. Extremities: Radials/DP/PT 2+ and equal bilaterally. No clubbing or cyanosis. No edema ***  Skin: Warm and dry, no rash. Neuro: Strength intact.  Assessment & Plan   ***  Disposition: ***     {Are you ordering a CV Procedure (e.g. stress test, cath, DCCV, TEE, etc)?   Press F2        :[id number]}   Signed, Barnie HERO. Jamilah Jean, DNP, NP-C  "

## 2024-05-01 ENCOUNTER — Ambulatory Visit: Admitting: Student

## 2024-06-18 ENCOUNTER — Ambulatory Visit: Admitting: Internal Medicine

## 2025-02-20 ENCOUNTER — Ambulatory Visit: Admitting: Urology
